# Patient Record
Sex: Female | Born: 1985 | Race: Black or African American | Hispanic: No
Health system: Southern US, Community
[De-identification: ages and names within clinical notes are randomized; demographics above are authoritative.]

## PROBLEM LIST (undated history)

## (undated) ENCOUNTER — Inpatient Hospital Stay (HOSPITAL_COMMUNITY): Payer: Self-pay

## (undated) ENCOUNTER — Emergency Department (HOSPITAL_COMMUNITY): Payer: Medicaid Other

## (undated) DIAGNOSIS — O10919 Unspecified pre-existing hypertension complicating pregnancy, unspecified trimester: Secondary | ICD-10-CM

## (undated) DIAGNOSIS — O903 Peripartum cardiomyopathy: Secondary | ICD-10-CM

## (undated) DIAGNOSIS — B9689 Other specified bacterial agents as the cause of diseases classified elsewhere: Secondary | ICD-10-CM

## (undated) DIAGNOSIS — N760 Acute vaginitis: Secondary | ICD-10-CM

## (undated) DIAGNOSIS — I509 Heart failure, unspecified: Secondary | ICD-10-CM

## (undated) DIAGNOSIS — N159 Renal tubulo-interstitial disease, unspecified: Secondary | ICD-10-CM

## (undated) DIAGNOSIS — R87610 Atypical squamous cells of undetermined significance on cytologic smear of cervix (ASC-US): Secondary | ICD-10-CM

## (undated) DIAGNOSIS — N870 Mild cervical dysplasia: Secondary | ICD-10-CM

## (undated) DIAGNOSIS — O139 Gestational [pregnancy-induced] hypertension without significant proteinuria, unspecified trimester: Secondary | ICD-10-CM

## (undated) DIAGNOSIS — I10 Essential (primary) hypertension: Secondary | ICD-10-CM

## (undated) DIAGNOSIS — N390 Urinary tract infection, site not specified: Secondary | ICD-10-CM

## (undated) DIAGNOSIS — I5041 Acute combined systolic (congestive) and diastolic (congestive) heart failure: Secondary | ICD-10-CM

## (undated) DIAGNOSIS — R87810 Cervical high risk human papillomavirus (HPV) DNA test positive: Secondary | ICD-10-CM

## (undated) DIAGNOSIS — Z87440 Personal history of urinary (tract) infections: Secondary | ICD-10-CM

## (undated) DIAGNOSIS — D582 Other hemoglobinopathies: Secondary | ICD-10-CM

## (undated) DIAGNOSIS — Z98891 History of uterine scar from previous surgery: Secondary | ICD-10-CM

## (undated) DIAGNOSIS — F1290 Cannabis use, unspecified, uncomplicated: Secondary | ICD-10-CM

## (undated) DIAGNOSIS — O24419 Gestational diabetes mellitus in pregnancy, unspecified control: Secondary | ICD-10-CM

## (undated) HISTORY — DX: Cervical high risk human papillomavirus (HPV) DNA test positive: R87.810

## (undated) HISTORY — DX: Other hemoglobinopathies: D58.2

## (undated) HISTORY — DX: Other specified bacterial agents as the cause of diseases classified elsewhere: N76.0

## (undated) HISTORY — DX: Heart failure, unspecified: I50.9

## (undated) HISTORY — DX: Other specified bacterial agents as the cause of diseases classified elsewhere: B96.89

## (undated) HISTORY — DX: Acute combined systolic (congestive) and diastolic (congestive) heart failure: I50.41

## (undated) HISTORY — DX: Mild cervical dysplasia: N87.0

## (undated) HISTORY — DX: Cannabis use, unspecified, uncomplicated: F12.90

## (undated) HISTORY — DX: Renal tubulo-interstitial disease, unspecified: N15.9

## (undated) HISTORY — DX: Essential (primary) hypertension: I10

## (undated) HISTORY — DX: Peripartum cardiomyopathy: O90.3

## (undated) HISTORY — DX: Acute vaginitis: N76.0

## (undated) HISTORY — DX: History of uterine scar from previous surgery: Z98.891

## (undated) HISTORY — DX: Unspecified pre-existing hypertension complicating pregnancy, unspecified trimester: O10.919

## (undated) HISTORY — DX: Personal history of urinary (tract) infections: Z87.440

## (undated) HISTORY — DX: Atypical squamous cells of undetermined significance on cytologic smear of cervix (ASC-US): R87.610

---

## 2003-02-28 ENCOUNTER — Encounter: Payer: Self-pay | Admitting: *Deleted

## 2003-02-28 ENCOUNTER — Inpatient Hospital Stay (HOSPITAL_COMMUNITY): Admission: AD | Admit: 2003-02-28 | Discharge: 2003-02-28 | Payer: Self-pay | Admitting: *Deleted

## 2003-04-20 ENCOUNTER — Encounter: Payer: Self-pay | Admitting: Obstetrics & Gynecology

## 2003-04-20 ENCOUNTER — Ambulatory Visit (HOSPITAL_COMMUNITY): Admission: RE | Admit: 2003-04-20 | Discharge: 2003-04-20 | Payer: Self-pay | Admitting: Obstetrics & Gynecology

## 2003-06-26 ENCOUNTER — Inpatient Hospital Stay (HOSPITAL_COMMUNITY): Admission: AD | Admit: 2003-06-26 | Discharge: 2003-07-08 | Payer: Self-pay | Admitting: Obstetrics & Gynecology

## 2003-06-26 ENCOUNTER — Encounter: Payer: Self-pay | Admitting: Obstetrics & Gynecology

## 2003-06-30 ENCOUNTER — Encounter: Payer: Self-pay | Admitting: Obstetrics & Gynecology

## 2003-06-30 ENCOUNTER — Encounter (INDEPENDENT_AMBULATORY_CARE_PROVIDER_SITE_OTHER): Payer: Self-pay

## 2003-07-30 ENCOUNTER — Inpatient Hospital Stay (HOSPITAL_COMMUNITY): Admission: AD | Admit: 2003-07-30 | Discharge: 2003-07-30 | Payer: Self-pay | Admitting: Obstetrics

## 2003-08-16 ENCOUNTER — Emergency Department (HOSPITAL_COMMUNITY): Admission: AD | Admit: 2003-08-16 | Discharge: 2003-08-16 | Payer: Self-pay | Admitting: Family Medicine

## 2003-10-30 ENCOUNTER — Emergency Department (HOSPITAL_COMMUNITY): Admission: EM | Admit: 2003-10-30 | Discharge: 2003-10-30 | Payer: Self-pay | Admitting: Emergency Medicine

## 2003-11-13 ENCOUNTER — Emergency Department (HOSPITAL_COMMUNITY): Admission: EM | Admit: 2003-11-13 | Discharge: 2003-11-13 | Payer: Self-pay | Admitting: Emergency Medicine

## 2004-03-01 ENCOUNTER — Ambulatory Visit (HOSPITAL_COMMUNITY): Admission: RE | Admit: 2004-03-01 | Discharge: 2004-03-01 | Payer: Self-pay | Admitting: Obstetrics & Gynecology

## 2004-04-25 ENCOUNTER — Inpatient Hospital Stay (HOSPITAL_COMMUNITY): Admission: AD | Admit: 2004-04-25 | Discharge: 2004-04-29 | Payer: Self-pay | Admitting: Obstetrics

## 2004-04-26 ENCOUNTER — Encounter (INDEPENDENT_AMBULATORY_CARE_PROVIDER_SITE_OTHER): Payer: Self-pay | Admitting: *Deleted

## 2005-02-14 ENCOUNTER — Emergency Department (HOSPITAL_COMMUNITY): Admission: EM | Admit: 2005-02-14 | Discharge: 2005-02-14 | Payer: Self-pay | Admitting: Family Medicine

## 2006-03-26 ENCOUNTER — Emergency Department (HOSPITAL_COMMUNITY): Admission: EM | Admit: 2006-03-26 | Discharge: 2006-03-26 | Payer: Self-pay | Admitting: Family Medicine

## 2007-04-09 ENCOUNTER — Emergency Department (HOSPITAL_COMMUNITY): Admission: EM | Admit: 2007-04-09 | Discharge: 2007-04-09 | Payer: Self-pay | Admitting: Family Medicine

## 2007-06-02 ENCOUNTER — Emergency Department (HOSPITAL_COMMUNITY): Admission: EM | Admit: 2007-06-02 | Discharge: 2007-06-02 | Payer: Self-pay | Admitting: Family Medicine

## 2007-11-03 ENCOUNTER — Emergency Department (HOSPITAL_COMMUNITY): Admission: EM | Admit: 2007-11-03 | Discharge: 2007-11-03 | Payer: Self-pay | Admitting: Family Medicine

## 2007-12-28 ENCOUNTER — Emergency Department (HOSPITAL_COMMUNITY): Admission: EM | Admit: 2007-12-28 | Discharge: 2007-12-28 | Payer: Self-pay | Admitting: Emergency Medicine

## 2009-02-24 ENCOUNTER — Inpatient Hospital Stay (HOSPITAL_COMMUNITY): Admission: AD | Admit: 2009-02-24 | Discharge: 2009-02-24 | Payer: Self-pay | Admitting: Obstetrics & Gynecology

## 2009-05-03 ENCOUNTER — Inpatient Hospital Stay (HOSPITAL_COMMUNITY): Admission: AD | Admit: 2009-05-03 | Discharge: 2009-05-03 | Payer: Self-pay | Admitting: Obstetrics & Gynecology

## 2009-05-09 ENCOUNTER — Inpatient Hospital Stay (HOSPITAL_COMMUNITY): Admission: AD | Admit: 2009-05-09 | Discharge: 2009-05-09 | Payer: Self-pay | Admitting: Obstetrics & Gynecology

## 2009-06-12 ENCOUNTER — Inpatient Hospital Stay (HOSPITAL_COMMUNITY): Admission: AD | Admit: 2009-06-12 | Discharge: 2009-06-12 | Payer: Self-pay | Admitting: Obstetrics & Gynecology

## 2009-06-28 ENCOUNTER — Ambulatory Visit: Payer: Self-pay | Admitting: Obstetrics & Gynecology

## 2009-07-12 ENCOUNTER — Ambulatory Visit: Payer: Self-pay | Admitting: Obstetrics & Gynecology

## 2009-07-12 LAB — CONVERTED CEMR LAB
Hgb A: 71.5 % — ABNORMAL LOW (ref 96.8–97.8)
Hgb S Quant: 0 % (ref 0.0–0.0)

## 2009-07-13 ENCOUNTER — Encounter: Payer: Self-pay | Admitting: Obstetrics & Gynecology

## 2009-07-13 LAB — CONVERTED CEMR LAB
Trich, Wet Prep: NONE SEEN
Yeast Wet Prep HPF POC: NONE SEEN

## 2009-07-18 ENCOUNTER — Ambulatory Visit (HOSPITAL_COMMUNITY): Admission: RE | Admit: 2009-07-18 | Discharge: 2009-07-18 | Payer: Self-pay | Admitting: Obstetrics & Gynecology

## 2009-07-26 ENCOUNTER — Ambulatory Visit: Payer: Self-pay | Admitting: Obstetrics & Gynecology

## 2009-08-09 ENCOUNTER — Ambulatory Visit: Payer: Self-pay | Admitting: Obstetrics & Gynecology

## 2009-08-23 ENCOUNTER — Ambulatory Visit: Payer: Self-pay | Admitting: Obstetrics & Gynecology

## 2009-08-30 ENCOUNTER — Encounter: Payer: Self-pay | Admitting: Obstetrics & Gynecology

## 2009-08-30 ENCOUNTER — Ambulatory Visit: Payer: Self-pay | Admitting: Obstetrics & Gynecology

## 2009-08-30 LAB — CONVERTED CEMR LAB: Chlamydia, DNA Probe: NEGATIVE

## 2009-08-31 ENCOUNTER — Encounter: Payer: Self-pay | Admitting: Obstetrics & Gynecology

## 2009-09-13 ENCOUNTER — Ambulatory Visit: Payer: Self-pay | Admitting: Obstetrics & Gynecology

## 2009-09-19 ENCOUNTER — Ambulatory Visit: Payer: Self-pay | Admitting: Family Medicine

## 2009-09-19 ENCOUNTER — Inpatient Hospital Stay (HOSPITAL_COMMUNITY): Admission: RE | Admit: 2009-09-19 | Discharge: 2009-09-22 | Payer: Self-pay | Admitting: Family Medicine

## 2009-09-24 ENCOUNTER — Inpatient Hospital Stay (HOSPITAL_COMMUNITY): Admission: AD | Admit: 2009-09-24 | Discharge: 2009-09-24 | Payer: Self-pay | Admitting: Family Medicine

## 2010-04-19 ENCOUNTER — Emergency Department (HOSPITAL_COMMUNITY): Admission: EM | Admit: 2010-04-19 | Discharge: 2010-04-20 | Payer: Self-pay | Admitting: Emergency Medicine

## 2010-10-13 ENCOUNTER — Encounter: Payer: Self-pay | Admitting: *Deleted

## 2010-12-07 LAB — CBC
HCT: 33.7 % — ABNORMAL LOW (ref 36.0–46.0)
Hemoglobin: 10.8 g/dL — ABNORMAL LOW (ref 12.0–15.0)
Platelets: 257 10*3/uL (ref 150–400)
RBC: 5.14 MIL/uL — ABNORMAL HIGH (ref 3.87–5.11)
RDW: 19.7 % — ABNORMAL HIGH (ref 11.5–15.5)

## 2010-12-07 LAB — URINALYSIS, ROUTINE W REFLEX MICROSCOPIC
Bilirubin Urine: NEGATIVE
Nitrite: POSITIVE — AB
Protein, ur: 30 mg/dL — AB
Urobilinogen, UA: 0.2 mg/dL (ref 0.0–1.0)

## 2010-12-07 LAB — DIFFERENTIAL
Eosinophils Absolute: 0 10*3/uL (ref 0.0–0.7)
Eosinophils Relative: 0 % (ref 0–5)
Lymphocytes Relative: 22 % (ref 12–46)
Metamyelocytes Relative: 0 %
Monocytes Absolute: 0.8 10*3/uL (ref 0.1–1.0)
Monocytes Relative: 7 % (ref 3–12)
Myelocytes: 0 %
Neutro Abs: 7.6 10*3/uL (ref 1.7–7.7)
nRBC: 0 /100 WBC

## 2010-12-07 LAB — URINE MICROSCOPIC-ADD ON

## 2010-12-07 LAB — POCT I-STAT, CHEM 8
Creatinine, Ser: 1.2 mg/dL (ref 0.4–1.2)
Glucose, Bld: 99 mg/dL (ref 70–99)
HCT: 38 % (ref 36.0–46.0)
Potassium: 3.5 mEq/L (ref 3.5–5.1)
TCO2: 25 mmol/L (ref 0–100)

## 2010-12-07 LAB — POCT PREGNANCY, URINE: Preg Test, Ur: NEGATIVE

## 2010-12-08 LAB — URINALYSIS, ROUTINE W REFLEX MICROSCOPIC
Bilirubin Urine: NEGATIVE
Glucose, UA: NEGATIVE mg/dL
Ketones, ur: NEGATIVE mg/dL
Protein, ur: NEGATIVE mg/dL
Specific Gravity, Urine: 1.01 (ref 1.005–1.030)

## 2010-12-08 LAB — CBC: HCT: 22.6 % — ABNORMAL LOW (ref 36.0–46.0)

## 2010-12-08 LAB — COMPREHENSIVE METABOLIC PANEL
AST: 24 U/L (ref 0–37)
BUN: 9 mg/dL (ref 6–23)
CO2: 28 mEq/L (ref 19–32)
Calcium: 8.7 mg/dL (ref 8.4–10.5)
Chloride: 104 mEq/L (ref 96–112)
GFR calc Af Amer: >60 mL/min (ref >60)
GFR calc non Af Amer: >60 mL/min (ref >60)
Glucose, Bld: 94 mg/dL (ref 70–99)
Potassium: 3.6 mEq/L (ref 3.5–5.1)
Sodium: 138 mEq/L (ref 135–145)
Total Protein: 5.5 g/dL — ABNORMAL LOW (ref 6.0–8.3)

## 2010-12-08 LAB — URINE MICROSCOPIC-ADD ON

## 2010-12-23 LAB — POCT URINALYSIS DIP (DEVICE)
Bilirubin Urine: NEGATIVE
Glucose, UA: NEGATIVE mg/dL
Ketones, ur: NEGATIVE mg/dL
Protein, ur: NEGATIVE mg/dL
Specific Gravity, Urine: 1.015 (ref 1.005–1.030)
pH: 7 (ref 5.0–8.0)

## 2010-12-23 LAB — RPR: RPR Ser Ql: NONREACTIVE

## 2010-12-23 LAB — CBC
HCT: 35.7 % — ABNORMAL LOW (ref 36.0–46.0)
MCHC: 33.1 g/dL (ref 30.0–36.0)
MCV: 81.7 fL (ref 78.0–100.0)
Platelets: 195 10*3/uL (ref 150–400)
RBC: 2.64 MIL/uL — ABNORMAL LOW (ref 3.87–5.11)
RDW: 15.7 % — ABNORMAL HIGH (ref 11.5–15.5)
RDW: 15.8 % — ABNORMAL HIGH (ref 11.5–15.5)

## 2010-12-23 LAB — TYPE AND SCREEN: Antibody Screen: NEGATIVE

## 2010-12-24 LAB — POCT URINALYSIS DIP (DEVICE)
Bilirubin Urine: NEGATIVE
Glucose, UA: NEGATIVE mg/dL
Nitrite: NEGATIVE
Specific Gravity, Urine: 1.015 (ref 1.005–1.030)
Specific Gravity, Urine: <=1.005 (ref 1.005–1.030)
Urobilinogen, UA: 0.2 mg/dL (ref 0.0–1.0)
pH: 7 (ref 5.0–8.0)

## 2010-12-25 LAB — POCT URINALYSIS DIP (DEVICE)
Bilirubin Urine: NEGATIVE
Glucose, UA: NEGATIVE mg/dL
Glucose, UA: NEGATIVE mg/dL
Ketones, ur: NEGATIVE mg/dL
Specific Gravity, Urine: 1.025 (ref 1.005–1.030)
pH: 7 (ref 5.0–8.0)

## 2010-12-25 LAB — GLUCOSE, CAPILLARY: Glucose-Capillary: 90 mg/dL (ref 70–99)

## 2010-12-26 LAB — POCT URINALYSIS DIP (DEVICE)
Bilirubin Urine: NEGATIVE
Glucose, UA: NEGATIVE mg/dL
Ketones, ur: NEGATIVE mg/dL
Ketones, ur: NEGATIVE mg/dL
Nitrite: NEGATIVE
Protein, ur: NEGATIVE mg/dL
Protein, ur: NEGATIVE mg/dL
Specific Gravity, Urine: 1.01 (ref 1.005–1.030)
Urobilinogen, UA: 0.2 mg/dL (ref 0.0–1.0)
pH: 6.5 (ref 5.0–8.0)

## 2010-12-27 LAB — WET PREP, GENITAL

## 2010-12-27 LAB — URINALYSIS, ROUTINE W REFLEX MICROSCOPIC
Glucose, UA: NEGATIVE mg/dL
Ketones, ur: NEGATIVE mg/dL
Leukocytes, UA: NEGATIVE
Nitrite: NEGATIVE
Protein, ur: NEGATIVE mg/dL

## 2010-12-27 LAB — URINE MICROSCOPIC-ADD ON

## 2010-12-28 LAB — URINALYSIS, ROUTINE W REFLEX MICROSCOPIC
Glucose, UA: NEGATIVE mg/dL
Leukocytes, UA: NEGATIVE
Nitrite: NEGATIVE
Protein, ur: NEGATIVE mg/dL
Protein, ur: NEGATIVE mg/dL
Specific Gravity, Urine: 1.01 (ref 1.005–1.030)
Specific Gravity, Urine: 1.01 (ref 1.005–1.030)
Urobilinogen, UA: 0.2 mg/dL (ref 0.0–1.0)
Urobilinogen, UA: 0.2 mg/dL (ref 0.0–1.0)

## 2010-12-28 LAB — CBC
HCT: 34.4 % — ABNORMAL LOW (ref 36.0–46.0)
Hemoglobin: 10.8 g/dL — ABNORMAL LOW (ref 12.0–15.0)
MCV: 82.6 fL (ref 78.0–100.0)
Platelets: 214 10*3/uL (ref 150–400)
RBC: 4.16 MIL/uL (ref 3.87–5.11)
RBC: 3.89 MIL/uL (ref 3.87–5.11)
WBC: 9.2 10*3/uL (ref 4.0–10.5)
WBC: 10.1 10*3/uL (ref 4.0–10.5)

## 2010-12-28 LAB — DIFFERENTIAL
Basophils Relative: 2 % — ABNORMAL HIGH (ref 0–1)
Lymphocytes Relative: 19 % (ref 12–46)
Lymphs Abs: 1.9 10*3/uL (ref 0.7–4.0)
Monocytes Absolute: 1 10*3/uL (ref 0.1–1.0)
Monocytes Relative: 10 % (ref 3–12)
Neutro Abs: 6.8 10*3/uL (ref 1.7–7.7)
Neutrophils Relative %: 68 % (ref 43–77)

## 2010-12-28 LAB — URINE MICROSCOPIC-ADD ON

## 2010-12-28 LAB — RAPID URINE DRUG SCREEN, HOSP PERFORMED
Amphetamines: NOT DETECTED
Barbiturates: NOT DETECTED
Benzodiazepines: NOT DETECTED

## 2010-12-28 LAB — RUBELLA SCREEN: Rubella: 36 IU/mL — ABNORMAL HIGH

## 2010-12-28 LAB — RPR: RPR Ser Ql: NONREACTIVE

## 2010-12-28 LAB — ABO/RH: ABO/RH(D): A POS

## 2010-12-28 LAB — GC/CHLAMYDIA PROBE AMP, URINE: GC Probe Amp, Urine: NEGATIVE

## 2010-12-30 LAB — URINALYSIS, ROUTINE W REFLEX MICROSCOPIC
Hgb urine dipstick: NEGATIVE
Nitrite: NEGATIVE
Protein, ur: NEGATIVE mg/dL
Specific Gravity, Urine: 1.025 (ref 1.005–1.030)
Urobilinogen, UA: 0.2 mg/dL (ref 0.0–1.0)

## 2011-02-07 NOTE — Op Note (Signed)
NAME:  Lisa Vazquez, Lisa Vazquez                    ACCOUNT NO.:  [id number]   MEDICAL RECORD NO.:  [id number]                   PATIENT TYPE:  INP   LOCATION:  9372                                 FACILITY:  [facility]   PHYSICIAN:  Roseanna Rainbow, M.D.         DATE OF BIRTH:  10-Jan-1986   DATE OF PROCEDURE:  06/30/2003  DATE OF DISCHARGE:                                 OPERATIVE REPORT   PREOPERATIVE DIAGNOSES:  1. Intrauterine pregnancy at 30 weeks.  2. Severe preeclampsia with pulmonary edema.  3. Breech presentation.   POSTOPERATIVE DIAGNOSES:  1. Intrauterine pregnancy at 30 weeks.  2. Severe preeclampsia with pulmonary edema.  3. Breech presentation.   PROCEDURE:  Primary low transverse cesarean section via Pfannenstiel.   SURGEON:  Roseanna Rainbow, M.D.   ANESTHESIA:  Spinal.   COMPLICATIONS:  None.   ESTIMATED BLOOD LOSS:  900 mL.   FLUIDS AND URINE OUTPUT:  As per anesthesiology.   INDICATIONS FOR PROCEDURE:  The patient is an 25 year old para 0 at 30 weeks  with severe preeclampsia, pulmonary edema.  Ultrasound demonstrated a breech  presentation.   FINDINGS:  Infant breech presentation.  Neonatology present at delivery.  Apgars 3 and 7 at one and five minutes.  Normal uterus, tubes and ovaries.   DESCRIPTION OF PROCEDURE:  The patient was taken to the operating room where  spinal anesthetic was administered and found to be adequate.  She was then  prepped and draped in the normal sterile fashion in the dorsal supine  position with a leftward tilt.  A Pfannenstiel skin incision was then made  with the scalpel and carried through to the underlying layer of fascia with  the Bovie.  The fascia was then incised in the midline and the incision  extended laterally with Mayo scissors.  The superior aspect of the fascial  incision was then grasped with the Kocher clamps, elevated, and the  underlying rectus muscles dissected off.  Attention was then turned to  the  inferior aspect of this incision which was manipulated in a similar fashion.  The rectus muscles were separated in the midline.  The parietal peritoneum  was identified, tented up and entered sharply with Metzenbaum scissors.  The  peritoneal incision was then extended superiorly and inferiorly with good  visualization of the bladder.  The bladder blade was then inserted and the  vesicouterine peritoneum identified, grasped with pickups and entered  sharply with the Metzenbaum scissors.  This incision was then extended  laterally and the bladder flap created digitally.  The bladder blade was  then reinserted in the lower uterine segment, incised in a transverse  fashion with the scalpel.  The uterine incision was then extended laterally  with the bandage scissors.  The bladder blade was removed and a complete  breech extraction was performed.  The cord was clamped and cut.  The infant  was handed off to the awaiting neonatologist.  An umbilical artery  pH was  sent.  The placenta was then removed.  The uterus exteriorized and was  cleared of all clots and debris.  The uterine incision was repaired with 0  Monocryl in a running locked fashion.  A second layer of the same suture was  used to imbricate the middle aspect of the uterine incision.  Excellent  hemostasis was noted.  The uterus was returned to the abdomen.  The gutters  were cleared of all clots.  The peritoneum was closed with 2-0 Vicryl.  The  fascia was reapproximated with 0 Vicryl in running fashion.  The skin was  closed with staples.  The patient tolerated the procedure well.  Sponge, lap  and needle counts correct x 2.  There was 1 g of cefazolin given at cord  clamp.  The patient was taken to the PACU in stable condition.                                               Roseanna Rainbow, M.D.    Judee Clara  D:  07/03/2003  T:  07/03/2003  Job:  [id number]

## 2011-02-07 NOTE — Discharge Summary (Signed)
NAME:  Lisa Vazquez, Lisa Vazquez                    ACCOUNT NO.:  [id number]   MEDICAL RECORD NO.:  [id number]                   PATIENT TYPE:  INP   LOCATION:  9325                                 FACILITY:  [facility]   PHYSICIAN:  Roseanna Rainbow, M.D.         DATE OF BIRTH:  09-Jul-1986   DATE OF ADMISSION:  06/26/2003  DATE OF DISCHARGE:  07/08/2003                                 DISCHARGE SUMMARY   CHIEF COMPLAINT:  The patient is an 25 year old gravida 2, para 0 who  presents at 29-3/7 weeks complaining of a headache.   HISTORY OF PRESENT ILLNESS:  As above.   PRENATAL COURSE:  Source of care [facility] with onset of care at 12 weeks.  No  significant pregnancy complications or risks.   MEDICATIONS:  Prenatal vitamins.   ALLERGIES:  No known drug allergies.   PAST OBSTETRICAL/GYNECOLOGICAL HISTORY:  Voluntary termination of pregnancy  at 8 weeks.  No sexually transmitted diseases.   PAST MEDICAL HISTORY:  She denies.   PAST SURGICAL HISTORY:  She denies.   SOCIAL HISTORY:  No history of substance abuse.   PRENATAL LABORATORY RESULTS:  Blood type and Rh A positive, antibody screen  negative, hemoglobin 13.1, platelets 209,000, rubella nonimmune, hepatitis B  surface antigen negative, syphilis nonreactive, HIV nonreactive, GC  negative, Chlamydia negative.   PHYSICAL EXAMINATION:  VITAL SIGNS:  Pulse is 95, blood pressure 140s-  150s/100-110.  GENERAL:  No apparent distress.  ABDOMEN:  Gravid.  LUNGS:  Clear to auscultation bilaterally.  EXTREMITIES:  Deep tendon reflexes 2+ throughout.  GU/RECTAL:  Digital exam deferred.  Fetal heart rate baseline rate 140s,  average variability, minimally reactive, mild variable decelerations,  contractions none.   LABORATORY WORK:  Urine dip greater than 3+ protein, uric acid 4.9,  creatinine 0.9, hemoglobin 12.9, platelets 186,000, SGOT/PT within normal  limits, LDH less than 250.   ASSESSMENT:  Intrauterine pregnancy at 29-plus  weeks with severe  preeclampsia.   PLAN:  Admission, magnesium sulfate seizure prophylaxis, betamethasone, will  perform a 24-hour urine, and observe closely.   HOSPITAL COURSE:  The patient was admitted.  She received magnesium sulfate  seizure prophylaxis.  She was started on labetalol for labile blood  pressures.  Fetal heart tracings remained reassuring.  Twenty-four-hour  urine remarkable for 3.5 g of protein.  The patient continued to have  increasing water weight gain edema.  On hospital day #3 the fetal heart  tracing was noted to have decreased long-term variability however,  biophysical profile was reassuring.  Later that day the patient became  tachypneic however, her O2 saturations were 98% on room air.  Earlier during  the day the ultrasounds with the biophysical profile demonstrated a breech  presentation.  On exam of her lungs there were rales appreciated at the  right base, at this point the findings were worrisome for early pulmonary  edema, the chest x-ray confirmed this.  The patient was then brought to  the  operating room for a cesarean delivery.  Please see the dictated operative  summary for further details.  Her postoperative course was remarkable for  worsening lab values however, she did not develop HELLP syndrome.  She  required Lasix to stimulate diuresis.  Magnesium sulfate seizure prophylaxis  was continued for 72 hours postoperatively.  On postoperative day #5 her  blood pressures became labile and the magnesium sulfate seizure prophylaxis  was resumed for 24 hours and her p.o. labetalol was titrated.  She was then  discharged to home on postoperative day #8 tolerating a regular diet.   DISCHARGE DIAGNOSES:  1. Intrauterine pregnancy at 29-plus weeks.  2. Severe preeclampsia.  3. Pulmonary edema.   PROCEDURE:  Cesarean delivery.   CONDITION:  Stable.   DIET:  Regular.   ACTIVITY:  No strenuous activity.   MEDICATIONS INCLUDED:  Labetalol, Tylox,  and prenatal vitamins.   DISPOSITION:  The patient was to follow up in the office in several days for  a blood pressure check.                                               Roseanna Rainbow, M.D.    Judee Clara  D:  08/02/2003  T:  08/03/2003  Job:  [id number]

## 2011-02-07 NOTE — Discharge Summary (Signed)
NAME:  Lisa Vazquez, Lisa Vazquez                    ACCOUNT NO.:  [id number]   MEDICAL RECORD NO.:  [id number]                   PATIENT TYPE:  INP   LOCATION:  9107                                 FACILITY:  [facility]   PHYSICIAN:  Charles A. Clearance Coots, M.D.             DATE OF BIRTH:  10/05/1985   DATE OF ADMISSION:  04/25/2004  DATE OF DISCHARGE:  04/29/2004                                 DISCHARGE SUMMARY   ADMITTING DIAGNOSES:  1. Thirty-five weeks gestation.  2. Rule out preeclampsia.  3. Previous cesarean section for severe preeclampsia at [redacted] weeks gestation;     desired repeat cesarean section.   DISCHARGE DIAGNOSES:  1. Thirty-five weeks gestation.  2. Rule out preeclampsia.  3. Previous cesarean section for severe preeclampsia at [redacted] weeks gestation;     desired repeat cesarean section.  4. Preeclampsia.  5. Status post repeat low transverse cesarean section on April 26, 2004 at     1507; Apgars of 7 at one minute, 8 at five minutes; weight of 2625 g,     length of 47 cm.  Discharged home in good condition.   REASON FOR ADMISSION:  A 25 year old black female G3 P0-1-0-1; EDC of  May 28, 2004; presents with a history of headache for several days and  progressively worse blurry vision, swelling of feet and hands on the day of  admission.  History of severe preeclampsia previous pregnancy and delivery  at 30 weeks by cesarean section in October 2004.  The patient had desired a  repeat cesarean section.   PAST MEDICAL HISTORY:  Surgery:  Cesarean section.  Illnesses:  None.   MEDICATIONS:  Prenatal vitamins, Medigesic.   ALLERGIES:  No known drug allergies.   SOCIAL HISTORY:  Single.  Negative tobacco, alcohol, or recreational drug  use.   PHYSICAL EXAMINATION:  VITAL SIGNS:  Temperature 98.6, pulse 105, blood  pressure 136/98, weight approximately 190 pounds.  LUNGS:  Clear to auscultation bilaterally.  HEART:  Regular rate and rhythm.  ABDOMEN:  Gravid, nontender.  PELVIC:  Cervix long and closed.   ADMITTING LABORATORY VALUES:  Hemoglobin 11; hematocrit 35; white blood cell  count 7800; platelets 175,000.  Urine was negative for protein.  BUN was 7,  creatinine was 0.7, uric acid was 4.2, SGOT was 18, SGPT was 11, LDH was  174.   HOSPITAL COURSE:  The patient was admitted and started on magnesium sulfate  for elevated blood pressures.  She responded well to magnesium sulfate and  was taken to the operating room for a repeat cesarean section on April 26, 2004 for preeclampsia.  Primary low transverse cesarean section was  performed without complications.  Postoperative course was uncomplicated.  The patient was on postoperative magnesium sulfate for 24 hours  postoperatively and blood pressures remained very stable.  She was therefore  discharged home on postoperative day #3 in good condition.   DISCHARGE LABORATORY VALUES:  Hemoglobin 9.2; hematocrit  28.4; white blood  cell count 10,000; platelets 193,000.   DISCHARGE DISPOSITION:  1. Medications:  Continue prenatal vitamins.  Tylox and ibuprofen were     prescribed for pain.  2. Routine written instructions per booklet were given for obstetrical     discharge after cesarean section.  3. The patient is to call the office for a follow-up appointment in 2 weeks.                                               Charles A. Clearance Coots, M.D.    CAH/MEDQ  D:  04/29/2004  T:  04/29/2004  Job:  [id number]

## 2011-02-07 NOTE — Op Note (Signed)
NAME:  Lisa Vazquez, Lisa Vazquez                    ACCOUNT NO.:  [id number]   MEDICAL RECORD NO.:  [id number]                   PATIENT TYPE:  INP   LOCATION:  9107                                 [facility]ACILITY:  [facility]   PHYSICIAN:  Roseanna Rainbow, M.D.         DATE OF BIRTH:  Mar 20, 1986   DATE OF PROCEDURE:  04/26/2004  DATE OF DISCHARGE:                                 OPERATIVE REPORT   PREOPERATIVE DIAGNOSES:  1. Intrauterine pregnancy at 35+ weeks.  2. Severe pregnancy-induced hypertension.  3. History of a previous cesarean delivery, declines vaginal birth after     cesarean delivery.   POSTOPERATIVE DIAGNOSES:  1. Intrauterine pregnancy at 35+ weeks.  2. Severe pregnancy-induced hypertension.  3. History of a previous cesarean delivery, declines vaginal birth after     cesarean delivery.   PROCEDURE:  Repeat cesarean delivery via Pfannenstiel skin incision.   SURGEONS:  Roseanna Rainbow, M.D., Bing Neighbors. Clearance Coots, M.D.   ANESTHESIA:  Spinal.   ESTIMATED BLOOD LOSS:  600 mL.   COMPLICATIONS:  None.   PROCEDURE:  The patient is taken to the operating room, a spinal anesthetic  was administered without difficulty.  The patient was then placed in the  dorsal supine position with a leftward tilt and prepped and draped in the  usual sterile fashion.  A Pfannenstiel skin incision was then made with a  scalpel and carried down to the underlying layer of fascia with the Bovie.  The fascia was then incised along its length.  The superior aspect of the  fascial incision was then tented up with Kocher clamps and the underlying  rectus muscles dissected off.  The inferior aspect of the fascial incision  was manipulated in a similar fashion.  The rectus muscles were separated in  the midline.  The parietal peritoneum was entered sharply.  This incision  was then extended superiorly and inferiorly with good visualization of the  bladder.  The bladder blade was then placed.  The  vesicouterine peritoneum  was tented up and entered sharply with Metzenbaum scissors.  This incision  was extended bilaterally and the bladder flap created sharply.  The lower  uterine segment was then incised in a transverse fashion with the scalpel.  This incision was then extended bluntly.  The head was delivered with the  assistance of a vacuum.  The cord was clamped and cut.  The infant was  handed off to the waiting neonatologist.  The placenta was then removed.  The uterus was cleared of any amniotic fluid and clots and debris with a  moistened laparotomy sponge.  The uterine incision was then reapproximated  with 0 Monocryl in a running interlocking fashion.  This incision was  imbricated with 0 Monocryl.  The paracolic gutters were copiously irrigated.  The fascia was  reapproximated with 0 PDS in a running fashion.  The skin was closed in a  subcuticular fashion with 3-0 Monocryl.  At the close of  the procedure the  instrument and pad counts were said to be correct x2.  The patient was taken  to the PACU awake and in stable condition.                                               Roseanna Rainbow, M.D.    Judee Clara  D:  04/26/2004  T:  04/28/2004  Job:  [id number]

## 2011-04-25 ENCOUNTER — Inpatient Hospital Stay (INDEPENDENT_AMBULATORY_CARE_PROVIDER_SITE_OTHER)
Admission: RE | Admit: 2011-04-25 | Discharge: 2011-04-25 | Disposition: A | Discharge status: Home / Self Care | Payer: Medicaid Other | Source: Ambulatory Visit | Attending: Emergency Medicine | Admitting: Emergency Medicine

## 2011-04-25 DIAGNOSIS — N390 Urinary tract infection, site not specified: Secondary | ICD-10-CM

## 2011-04-25 LAB — POCT URINALYSIS DIP (DEVICE)
Glucose, UA: NEGATIVE mg/dL
Nitrite: POSITIVE — AB
Urobilinogen, UA: 0.2 mg/dL (ref 0.0–1.0)

## 2011-06-13 LAB — INFLUENZA A AND B ANTIGEN (CONVERTED LAB)

## 2011-06-17 LAB — URINE CULTURE

## 2011-06-17 LAB — POCT URINALYSIS DIP (DEVICE)
Bilirubin Urine: NEGATIVE
Glucose, UA: NEGATIVE
Specific Gravity, Urine: 1.02
Urobilinogen, UA: 0.2
pH: 5.5

## 2011-06-17 LAB — WET PREP, GENITAL: Yeast Wet Prep HPF POC: NONE SEEN

## 2011-06-17 LAB — POCT PREGNANCY, URINE: Operator id: 126491

## 2011-06-17 LAB — GC/CHLAMYDIA PROBE AMP, GENITAL: GC Probe Amp, Genital: POSITIVE — AB

## 2011-07-07 LAB — POCT URINALYSIS DIP (DEVICE)
Bilirubin Urine: NEGATIVE
Glucose, UA: NEGATIVE
Ketones, ur: NEGATIVE
Nitrite: POSITIVE — AB
Operator id: 116391
Specific Gravity, Urine: 1.025
Urobilinogen, UA: 1
pH: 6

## 2011-07-07 LAB — POCT PREGNANCY, URINE: Operator id: 116391

## 2011-12-09 ENCOUNTER — Other Ambulatory Visit: Payer: Self-pay | Admitting: Obstetrics and Gynecology

## 2012-06-07 ENCOUNTER — Encounter (HOSPITAL_COMMUNITY): Payer: Self-pay

## 2012-06-07 ENCOUNTER — Emergency Department (INDEPENDENT_AMBULATORY_CARE_PROVIDER_SITE_OTHER)
Admission: EM | Admit: 2012-06-07 | Discharge: 2012-06-07 | Disposition: A | Discharge status: Home / Self Care | Payer: Self-pay | Source: Home / Self Care

## 2012-06-07 ENCOUNTER — Emergency Department (HOSPITAL_COMMUNITY)
Admission: EM | Admit: 2012-06-07 | Discharge: 2012-06-07 | Disposition: A | Discharge status: Home / Self Care | Payer: Self-pay | Attending: Emergency Medicine | Admitting: Emergency Medicine

## 2012-06-07 ENCOUNTER — Encounter (HOSPITAL_COMMUNITY): Payer: Self-pay | Admitting: *Deleted

## 2012-06-07 DIAGNOSIS — N10 Acute tubulo-interstitial nephritis: Secondary | ICD-10-CM

## 2012-06-07 DIAGNOSIS — N390 Urinary tract infection, site not specified: Secondary | ICD-10-CM | POA: Insufficient documentation

## 2012-06-07 DIAGNOSIS — R300 Dysuria: Secondary | ICD-10-CM

## 2012-06-07 LAB — COMPREHENSIVE METABOLIC PANEL
AST: 15 U/L (ref 0–37)
Albumin: 4.1 g/dL (ref 3.5–5.2)
Calcium: 9.9 mg/dL (ref 8.4–10.5)
Chloride: 100 mEq/L (ref 96–112)
Creatinine, Ser: 0.91 mg/dL (ref 0.50–1.10)
Total Bilirubin: 0.4 mg/dL (ref 0.3–1.2)

## 2012-06-07 LAB — URINALYSIS, ROUTINE W REFLEX MICROSCOPIC
Nitrite: NEGATIVE
Protein, ur: 30 mg/dL — AB
Urobilinogen, UA: 0.2 mg/dL (ref 0.0–1.0)

## 2012-06-07 LAB — CBC WITH DIFFERENTIAL/PLATELET
Basophils Absolute: 0 10*3/uL (ref 0.0–0.1)
Basophils Relative: 0 % (ref 0–1)
HCT: 38.7 % (ref 36.0–46.0)
MCHC: 34.4 g/dL (ref 30.0–36.0)
Monocytes Absolute: 1.3 10*3/uL — ABNORMAL HIGH (ref 0.1–1.0)
Neutro Abs: 6.5 10*3/uL (ref 1.7–7.7)
RDW: 14.5 % (ref 11.5–15.5)

## 2012-06-07 LAB — POCT URINALYSIS DIP (DEVICE)
Bilirubin Urine: NEGATIVE
Glucose, UA: NEGATIVE mg/dL
Nitrite: POSITIVE — AB

## 2012-06-07 LAB — URINE MICROSCOPIC-ADD ON

## 2012-06-07 LAB — PREGNANCY, URINE: Preg Test, Ur: NEGATIVE

## 2012-06-07 LAB — POCT PREGNANCY, URINE: Preg Test, Ur: NEGATIVE

## 2012-06-07 NOTE — ED Notes (Signed)
States she has been having lower back pain, lower abdominal area pain , chills; tearful

## 2012-06-07 NOTE — ED Notes (Signed)
The pt was diagnosed with a uti at [facility] and sent here for ???.   lmp 4 days ago

## 2012-06-07 NOTE — ED Notes (Signed)
The patient left without being seen and did so without signing herself out.

## 2012-06-07 NOTE — ED Provider Notes (Signed)
History     CSN: [id number]  Arrival date & time 06/07/12  1827   None     Chief Complaint  Patient presents with  . Fever    (Consider location/radiation/quality/duration/timing/severity/associated sxs/prior treatment) The history is provided by the patient.  Lisa Vazquez is a 26 y.o. female who complains of urinary retention and dysuria x one days, with flank pain, fever and chills.  Denies abnormal vaginal discharge or bleeding.   Has not taken medications for symptoms.  Has history of UTI, not frequent, last UTI many years ago.  Denies known kidney disease or structural abnormalities.  Past Medical History  Diagnosis Date  . UTI (lower urinary tract infection)     History reviewed. No pertinent past surgical history.  History reviewed. No pertinent family history.  History  Substance Use Topics  . Smoking status: Current Some Day Smoker  . Smokeless tobacco: Not on file  . Alcohol Use: No    OB History    Grav Para Term Preterm Abortions TAB SAB Ect Mult Living                  Review of Systems  Constitutional: Positive for fever and chills. Negative for fatigue.  Respiratory: Negative.   Cardiovascular: Negative.   Gastrointestinal: Positive for nausea and abdominal pain. Negative for vomiting, diarrhea, constipation, blood in stool and rectal pain.  Genitourinary: Positive for dysuria, flank pain, decreased urine volume, difficulty urinating and pelvic pain. Negative for urgency, frequency, hematuria, vaginal bleeding, vaginal discharge, vaginal pain and menstrual problem.  All other systems reviewed and are negative.    Allergies  Review of patient's allergies indicates no known allergies.  Home Medications   Current Outpatient Rx  Name Route Sig Dispense Refill  . CIPROFLOXACIN HCL 500 MG PO TABS Oral Take 1 tablet (500 mg total) by mouth every 12 (twelve) hours. 10 tablet 0    BP 120/78  Pulse 84  Temp 98.9 F (37.2 C) (Oral)  Resp 16   SpO2 97%  LMP 06/04/2012  Physical Exam  Nursing note and vitals reviewed. Constitutional: She is oriented to person, place, and time. Vital signs are normal. She appears well-developed and well-nourished. She is active and cooperative.  HENT:  Head: Normocephalic.  Eyes: Conjunctivae normal are normal. Pupils are equal, round, and reactive to light. No scleral icterus.  Neck: Trachea normal. Neck supple.  Cardiovascular: Normal rate and regular rhythm.   Pulmonary/Chest: Effort normal and breath sounds normal.  Abdominal: Soft. Normal appearance and bowel sounds are normal. There is tenderness in the suprapubic area. There is guarding and CVA tenderness.  Neurological: She is alert and oriented to person, place, and time. No cranial nerve deficit or sensory deficit.  Skin: Skin is warm and dry. No rash noted. She is not diaphoretic.  Psychiatric: She has a normal mood and affect. Her speech is normal and behavior is normal. Judgment and thought content normal. Cognition and memory are normal.    ED Course  Procedures (including critical care time)  Labs Reviewed  POCT URINALYSIS DIP (DEVICE) - Abnormal; Notable for the following:    Hgb urine dipstick MODERATE (*)     Protein, ur 30 (*)     Nitrite POSITIVE (*)     Leukocytes, UA MODERATE (*)  Biochemical Testing Only. Please order routine urinalysis from main lab if confirmatory testing is needed.   All other components within normal limits  POCT PREGNANCY, URINE  LAB REPORT - SCANNED  No results found.   1. Acute pyelonephritis   2. Dysuria       MDM  Transfer to [facility] for further evaluation and treatment.  Suspect need for IV antibiotics and fluids.         [name], NP 06/10/12 1057

## 2012-06-08 ENCOUNTER — Emergency Department (HOSPITAL_COMMUNITY)
Admission: EM | Admit: 2012-06-08 | Discharge: 2012-06-08 | Disposition: A | Discharge status: Home / Self Care | Payer: Self-pay | Attending: Emergency Medicine | Admitting: Emergency Medicine

## 2012-06-08 ENCOUNTER — Encounter (HOSPITAL_COMMUNITY): Payer: Self-pay

## 2012-06-08 DIAGNOSIS — N12 Tubulo-interstitial nephritis, not specified as acute or chronic: Secondary | ICD-10-CM | POA: Insufficient documentation

## 2012-06-08 HISTORY — DX: Urinary tract infection, site not specified: N39.0

## 2012-06-08 LAB — CBC WITH DIFFERENTIAL/PLATELET
Basophils Absolute: 0 10*3/uL (ref 0.0–0.1)
Basophils Relative: 0 % (ref 0–1)
Eosinophils Absolute: 0.1 10*3/uL (ref 0.0–0.7)
Eosinophils Relative: 1 % (ref 0–5)
HCT: 39 % (ref 36.0–46.0)
Hemoglobin: 13 g/dL (ref 12.0–15.0)
Lymphocytes Relative: 31 % (ref 12–46)
Lymphs Abs: 2.5 10*3/uL (ref 0.7–4.0)
MCH: 26.1 pg (ref 26.0–34.0)
MCHC: 33.3 g/dL (ref 30.0–36.0)
MCV: 78.3 fL (ref 78.0–100.0)
Monocytes Absolute: 1.1 10*3/uL — ABNORMAL HIGH (ref 0.1–1.0)
Monocytes Relative: 13 % — ABNORMAL HIGH (ref 3–12)
Neutro Abs: 4.5 10*3/uL (ref 1.7–7.7)
Neutrophils Relative %: 55 % (ref 43–77)
Platelets: 277 10*3/uL (ref 150–400)
RBC: 4.98 MIL/uL (ref 3.87–5.11)
RDW: 14.6 % (ref 11.5–15.5)
WBC: 8.1 10*3/uL (ref 4.0–10.5)

## 2012-06-08 LAB — COMPREHENSIVE METABOLIC PANEL
ALT: 11 U/L (ref 0–35)
AST: 12 U/L (ref 0–37)
Albumin: 3.8 g/dL (ref 3.5–5.2)
Alkaline Phosphatase: 52 U/L (ref 39–117)
BUN: 11 mg/dL (ref 6–23)
CO2: 28 mEq/L (ref 19–32)
Calcium: 9.6 mg/dL (ref 8.4–10.5)
Chloride: 100 mEq/L (ref 96–112)
Creatinine, Ser: 0.94 mg/dL (ref 0.50–1.10)
GFR calc Af Amer: >90 mL/min (ref >90)
GFR calc non Af Amer: 82 mL/min — ABNORMAL LOW (ref >90)
Glucose, Bld: 81 mg/dL (ref 70–99)
Potassium: 3.8 mEq/L (ref 3.5–5.1)
Sodium: 137 mEq/L (ref 135–145)
Total Bilirubin: 0.6 mg/dL (ref 0.3–1.2)
Total Protein: 7.4 g/dL (ref 6.0–8.3)

## 2012-06-08 LAB — LIPASE, BLOOD: Lipase: 12 U/L (ref 11–59)

## 2012-06-08 MED ORDER — MORPHINE SULFATE 4 MG/ML IJ SOLN
4.0000 mg | Freq: Once | INTRAMUSCULAR | Status: AC
  Administered 2012-06-08: 4 mg via INTRAVENOUS
  Filled 2012-06-08: qty 1

## 2012-06-08 MED ORDER — ONDANSETRON HCL 4 MG/2ML IJ SOLN
4.0000 mg | Freq: Once | INTRAMUSCULAR | Status: AC
  Administered 2012-06-08: 4 mg via INTRAVENOUS
  Filled 2012-06-08: qty 2

## 2012-06-08 MED ORDER — CIPROFLOXACIN HCL 500 MG PO TABS: 500.0000 mg | ORAL_TABLET | Freq: Two times a day (BID) | ORAL | Status: DC

## 2012-06-08 MED ORDER — SODIUM CHLORIDE 0.9 % IV BOLUS (SEPSIS)
1000.0000 mL | Freq: Once | INTRAVENOUS | Status: AC
  Administered 2012-06-08: 1000 mL via INTRAVENOUS

## 2012-06-08 MED ORDER — DEXTROSE 5 % IV SOLN
1.0000 g | Freq: Once | INTRAVENOUS | Status: AC
  Administered 2012-06-08: 1 g via INTRAVENOUS
  Filled 2012-06-08: qty 10

## 2012-06-08 MED ORDER — OXYCODONE-ACETAMINOPHEN 5-325 MG PO TABS
1.0000 | ORAL_TABLET | Freq: Once | ORAL | Status: AC
  Administered 2012-06-08: 1 via ORAL

## 2012-06-08 MED ORDER — OXYCODONE-ACETAMINOPHEN 5-325 MG PO TABS
ORAL_TABLET | ORAL | Status: AC
  Filled 2012-06-08: qty 1

## 2012-06-08 NOTE — ED Notes (Signed)
Pt presents with no acute distress- c/o of lower abdominal pain and bil flank pain greater on the right.  Denies N/V/D and fever

## 2012-06-10 LAB — URINE CULTURE: Colony Count: 50000

## 2012-06-11 NOTE — ED Notes (Signed)
+   Urine Patient treated with Cipro-sensitive to same-chart appended per protocol MD. 

## 2012-06-18 NOTE — ED Provider Notes (Signed)
Medical screening examination/treatment/procedure(s) were performed by resident physician or non-physician practitioner and as supervising physician I was immediately available for consultation/collaboration.   KINDL,JAMES DOUGLAS MD.    James D Kindl, MD 06/18/12 0822 

## 2012-06-18 NOTE — ED Provider Notes (Signed)
History    26 year old female with abdominal pain and dysuria. Onset about 3 days ago. Patient was evaluated in urgent care yesterday and referred to the ED. She did not want to wait so she went home and she came back today. Symptoms have persisted without significant change since then. Subjective fever. Nausea but no vomiting. No unusual vaginal bleeding or discharge. No diarrhea. No sick contacts. CSN: [id number]  Arrival date & time 06/08/12  4098   First MD Initiated Contact with Patient 06/08/12 1037      Chief Complaint  Patient presents with  . Abdominal Pain  . Urinary Retention  . Flank Pain    (Consider location/radiation/quality/duration/timing/severity/associated sxs/prior treatment) HPI  Past Medical History  Diagnosis Date  . UTI (lower urinary tract infection)     History reviewed. No pertinent past surgical history.  No family history on file.  History  Substance Use Topics  . Smoking status: Current Some Day Smoker  . Smokeless tobacco: Not on file  . Alcohol Use: No    OB History    Grav Para Term Preterm Abortions TAB SAB Ect Mult Living                  Review of Systems   Review of symptoms negative unless otherwise noted in HPI.   Allergies  Review of patient's allergies indicates no known allergies.  Home Medications   Current Outpatient Rx  Name Route Sig Dispense Refill  . CIPROFLOXACIN HCL 500 MG PO TABS Oral Take 1 tablet (500 mg total) by mouth every 12 (twelve) hours. 10 tablet 0    BP 108/68  Pulse 73  Temp 97.9 F (36.6 C) (Oral)  Resp 18  Ht 5\' 4"  (1.626 m)  Wt 145 lb (65.772 kg)  BMI 24.89 kg/m2  SpO2 97%  LMP 06/04/2012  Physical Exam  Nursing note and vitals reviewed. Constitutional: She appears well-developed and well-nourished. No distress.       In bed. Uncomfortable appearing but not toxic.  HENT:  Head: Normocephalic and atraumatic.  Eyes: Conjunctivae normal are normal. Right eye exhibits no discharge.  Left eye exhibits no discharge.  Neck: Neck supple.  Cardiovascular: Normal rate, regular rhythm and normal heart sounds.  Exam reveals no gallop and no friction rub.   No murmur heard. Pulmonary/Chest: Effort normal and breath sounds normal. No respiratory distress.  Abdominal: Soft. She exhibits no distension. There is tenderness.       Suprapubic tenderness with some voluntary guarding. No rebound. No mass palpated. No distention. No concerning lesions noted. Right CVA tenderness.  Musculoskeletal: She exhibits no edema and no tenderness.  Neurological: She is alert.  Skin: Skin is warm and dry.  Psychiatric: She has a normal mood and affect. Her behavior is normal. Thought content normal.    ED Course  Procedures (including critical care time)  Labs Reviewed  CBC WITH DIFFERENTIAL - Abnormal; Notable for the following:    Monocytes Relative 13 (*)     Monocytes Absolute 1.1 (*)     All other components within normal limits  COMPREHENSIVE METABOLIC PANEL - Abnormal; Notable for the following:    GFR calc non Af Amer 82 (*)     All other components within normal limits  LIPASE, BLOOD  URINE CULTURE  LAB REPORT - SCANNED   No results found.   1. Pyelonephritis       MDM  26 year old female with likely pyelonephritis. Urinalysis from yesterday was reviewed. Her urine is  consistent with infection. She received a dose of IV antibiotics in the ER. Symptoms are currently controlled. She's not having ongoing vomiting. She is afebrile and hemodynamically stable. Feel she is appropriate for outpatient treatment at this time. Strict return precautions were discussed. Outpatient followup otherwise.        Raeford Razor, MD 06/18/12 [phone]

## 2012-12-17 ENCOUNTER — Inpatient Hospital Stay (HOSPITAL_COMMUNITY)
Admission: AD | Admit: 2012-12-17 | Discharge: 2012-12-17 | Disposition: A | Discharge status: Home / Self Care | Payer: Self-pay | Source: Ambulatory Visit | Attending: Obstetrics & Gynecology | Admitting: Obstetrics & Gynecology

## 2012-12-17 ENCOUNTER — Encounter (HOSPITAL_COMMUNITY): Payer: Self-pay | Admitting: *Deleted

## 2012-12-17 DIAGNOSIS — N912 Amenorrhea, unspecified: Secondary | ICD-10-CM | POA: Insufficient documentation

## 2012-12-17 DIAGNOSIS — N760 Acute vaginitis: Secondary | ICD-10-CM | POA: Insufficient documentation

## 2012-12-17 DIAGNOSIS — N930 Postcoital and contact bleeding: Secondary | ICD-10-CM | POA: Insufficient documentation

## 2012-12-17 DIAGNOSIS — A499 Bacterial infection, unspecified: Secondary | ICD-10-CM | POA: Insufficient documentation

## 2012-12-17 DIAGNOSIS — B9689 Other specified bacterial agents as the cause of diseases classified elsewhere: Secondary | ICD-10-CM | POA: Insufficient documentation

## 2012-12-17 LAB — URINALYSIS, ROUTINE W REFLEX MICROSCOPIC
Hgb urine dipstick: NEGATIVE
Ketones, ur: NEGATIVE mg/dL
Protein, ur: NEGATIVE mg/dL
Urobilinogen, UA: 0.2 mg/dL (ref 0.0–1.0)

## 2012-12-17 LAB — POCT PREGNANCY, URINE: Preg Test, Ur: NEGATIVE

## 2012-12-17 MED ORDER — METRONIDAZOLE 500 MG PO TABS: 500.0000 mg | ORAL_TABLET | Freq: Two times a day (BID) | ORAL | Status: DC

## 2012-12-17 NOTE — MAU Note (Signed)
No cycle this month, one invalid test, one neg test.  Has been having bleeding with intercourse for a month.

## 2012-12-17 NOTE — MAU Provider Note (Signed)
History     CSN: [id number]  Arrival date and time: 12/17/12 1124   First Provider Initiated Contact with Patient 12/17/12 1217      Chief Complaint  Patient presents with  . Possible Pregnancy  . post coital bleeding    HPI Ms. Lisa Vazquez is a 27 y.o. [phone] who presents to MAU today with complaint of amenorrhea and post-coital bleeding. The patient states LMP was 11/08/12. She has had bleeding with intercourse for months. She is having mild lower abdominal cramping how. She denies fever, N/V today. She has been with the same partner x 2 years. She is sexually active without birth control or condoms. Last pap smear was done 12/09/11 and was normal although no transformation zone present on sample. The patient states that she has had a slight increase in discharge over the last week or so. There is no odor or change in color of the discharge.   OB History   Grav Para Term Preterm Abortions TAB SAB Ect Mult Living   4 3 1 2 1 1   0 0 3      Past Medical History  Diagnosis Date  . UTI (lower urinary tract infection)     No past surgical history on file.  No family history on file.  History  Substance Use Topics  . Smoking status: Current Some Day Smoker  . Smokeless tobacco: Not on file  . Alcohol Use: No    Allergies: No Known Allergies  Prescriptions prior to admission  Medication Sig Dispense Refill  . ibuprofen (ADVIL,MOTRIN) 800 MG tablet Take 800 mg by mouth every 8 (eight) hours as needed for pain.        Review of Systems  Constitutional: Negative for fever.  Gastrointestinal: Negative for nausea, vomiting and abdominal pain.  Genitourinary: Negative for dysuria, urgency and frequency.       + vaginal bleeding with intercourse + vaginal discharge   Physical Exam   Blood pressure 132/83, pulse 77, temperature 98.3 F (36.8 C), temperature source Oral, resp. rate 18, height 5\' 4"  (1.626 m), weight 146 lb (66.225 kg), last menstrual period  11/08/2012.  Physical Exam  Constitutional: She is oriented to person, place, and time. She appears well-developed and well-nourished. No distress.  HENT:  Head: Normocephalic and atraumatic.  Cardiovascular: Normal rate, regular rhythm and normal heart sounds.   Respiratory: Effort normal and breath sounds normal. No respiratory distress.  GI: Soft. Bowel sounds are normal. She exhibits no distension and no mass. There is tenderness (mild suprapubic tenderness to palpation). There is no rebound and no guarding.  Genitourinary: Vagina normal. Uterus is tender (mild tenderness to palpation on bimanual exam). Uterus is not enlarged. Cervix exhibits discharge (moderate amount of thin white discharge noted at the cervical os and in the vagina). Cervix exhibits no motion tenderness and no friability. Right adnexum displays no mass and no tenderness. Left adnexum displays no tenderness.  Neurological: She is alert and oriented to person, place, and time.  Skin: Skin is warm and dry. No erythema.  Psychiatric: She has a normal mood and affect.   Results for orders placed during the hospital encounter of 12/17/12 (from the past 24 hour(s))  URINALYSIS, ROUTINE W REFLEX MICROSCOPIC     Status: None   Collection Time    12/17/12 11:40 AM      Result Value Range   Color, Urine YELLOW  YELLOW   APPearance CLEAR  CLEAR   Specific Gravity, Urine 1.020  1.005 - 1.030   pH 5.5  5.0 - 8.0   Glucose, UA NEGATIVE  NEGATIVE mg/dL   Hgb urine dipstick NEGATIVE  NEGATIVE   Bilirubin Urine NEGATIVE  NEGATIVE   Ketones, ur NEGATIVE  NEGATIVE mg/dL   Protein, ur NEGATIVE  NEGATIVE mg/dL   Urobilinogen, UA 0.2  0.0 - 1.0 mg/dL   Nitrite NEGATIVE  NEGATIVE   Leukocytes, UA NEGATIVE  NEGATIVE  POCT PREGNANCY, URINE     Status: None   Collection Time    12/17/12 11:49 AM      Result Value Range   Preg Test, Ur NEGATIVE  NEGATIVE  WET PREP, GENITAL     Status: Abnormal   Collection Time    12/17/12 12:35 PM       Result Value Range   Yeast Wet Prep HPF POC NONE SEEN  NONE SEEN   Trich, Wet Prep NONE SEEN  NONE SEEN   Clue Cells Wet Prep HPF POC MODERATE (*) NONE SEEN   WBC, Wet Prep HPF POC FEW (*) NONE SEEN  HCG, SERUM, QUALITATIVE     Status: None   Collection Time    12/17/12 12:45 PM      Result Value Range   Preg, Serum NEGATIVE  NEGATIVE    MAU Course  Procedures None  MDM UPT, Serum pregnancy test, wet prep and GC/Chlamydia today  Assessment and Plan  A: Amenorrhea Bacterial vaginosis Post-coital bleeding  P: Discharge home Rx for Flagyl sent to patient's pharmacy Patient encouraged to take HPT in 1 week if still no period. Will refer to clinic for further work-up for amenorrhea Patient may return to MAU if her condition were to change or worsen  Freddi Starr, PA-C  12/17/2012, 1:26 PM

## 2012-12-17 NOTE — MAU Note (Signed)
Name and DOB verified, pt confirmed spelling is correct on arm band. 

## 2012-12-18 LAB — GC/CHLAMYDIA PROBE AMP
CT Probe RNA: NEGATIVE
GC Probe RNA: NEGATIVE

## 2013-01-13 ENCOUNTER — Encounter: Payer: Self-pay | Admitting: Medical

## 2013-01-13 ENCOUNTER — Ambulatory Visit (INDEPENDENT_AMBULATORY_CARE_PROVIDER_SITE_OTHER): Payer: Self-pay | Admitting: Medical

## 2013-01-13 VITALS — BP 132/92 | HR 73 | Temp 97.6°F | Ht 64.0 in | Wt 144.0 lb

## 2013-01-13 DIAGNOSIS — Z01419 Encounter for gynecological examination (general) (routine) without abnormal findings: Secondary | ICD-10-CM

## 2013-01-13 DIAGNOSIS — N949 Unspecified condition associated with female genital organs and menstrual cycle: Secondary | ICD-10-CM

## 2013-01-13 DIAGNOSIS — R102 Pelvic and perineal pain: Secondary | ICD-10-CM

## 2013-01-13 LAB — POCT URINALYSIS DIP (DEVICE)
Bilirubin Urine: NEGATIVE
Glucose, UA: NEGATIVE mg/dL
Nitrite: NEGATIVE
Urobilinogen, UA: 1 mg/dL (ref 0.0–1.0)

## 2013-01-13 NOTE — Progress Notes (Signed)
Patient ID: Lisa Vazquez, female   DOB: [birth year], 27 y.o.   MRN: Lisa Vazquez Subjective:    Lisa Vazquez is a 27 y.o. female who presents for an annual exam. The patient is sexually active. GYN screening history: last pap: approximate date 2013 and was normal. The patient wears seatbelts: yes. The patient participates in regular exercise: yes. Has the patient ever been transfused or tattooed?: yes. The patient reports that there is not domestic violence in her life. The patient states that she has continued to have post-coital spotting even after recent treatment for BV. GC/Chlamdyia was negative on 12/17/12. Patient has been having irregular bleeding since the removal of her Mirena in January. She is also having some pelvic cramping occasionally recently.   Menstrual History: OB History   Grav Para Term Preterm Abortions TAB SAB Ect Mult Living   4 3 1 2 1 1   0 0 3       Patient's last menstrual period was 12/14/2012.    The following portions of the patient's history were reviewed and updated as appropriate: allergies, current medications, past family history, past medical history, past social history, past surgical history and problem list.  Review of Systems Pertinent items are noted in HPI.    Objective:     BP 132/92  Pulse 73  Temp(Src) 97.6 F (36.4 C)  Ht 5\' 4"  (1.626 m)  Wt 144 lb (65.318 kg)  BMI 24.71 kg/m2  LMP 12/14/2012 GENERAL: Well-developed, well-nourished female in no acute distress.  HEENT: Normocephalic, atraumatic. Sclerae anicteric.  NECK: Supple. Normal thyroid.  LUNGS: Clear to auscultation bilaterally.  HEART: Regular rate and rhythm. BREASTS: Symmetric in size. No masses, skin changes, nipple drainage, or lymphadenopathy. ABDOMEN: Soft, nontender, nondistended. No organomegaly. PELVIC: Normal external female genitalia. Vagina is pink and rugated.  Normal discharge. Normal cervix contour. Pap smear obtained. Uterus is normal in size. No  adnexal mass or tenderness.  EXTREMITIES: No cyanosis, clubbing, or edema.  .    Assessment:    Healthy female exam.     Plan:     1. UPT negative today. UA shows moderate leukocytes, trace protein and trace blood. Will send for culture today.  2. Patient will return to clinic in 2 weeks for UPT and if negative can have Depo Provera at that time. Patient instructed to use condoms and/or abstain from intercourse until next UPT and depo provera given 3. Patient will receive a letter for normal pap smear results or we will call with any abnormal results 4. Patient can return for next annual exam in one year, next pap smear will be due in 2017 if normal today

## 2013-01-13 NOTE — Patient Instructions (Addendum)
Pap Test A Pap test is a procedure done in a clinic office to evaluate cells that are on the surface of the cervix. The cervix is the lower portion of the uterus and upper portion of the vagina. For some women, the cervical region has the potential to form cancer. With consistent evaluations by your caregiver, this type of cancer can be prevented.  If a Pap test is abnormal, it is most often a result of a previous exposure to human papillomavirus (HPV). HPV is a virus that can infect the cells of the cervix and cause dysplasia. Dysplasia is where the cells no longer look normal. If a woman has been diagnosed with high-grade or severe dysplasia, they are at higher risk of developing cervical cancer. People diagnosed with low-grade dysplasia should still be seen by their caregiver because there is a small chance that low-grade dysplasia could develop into cancer.  LET YOUR CAREGIVER KNOW ABOUT:  Recent sexually transmitted infection (STI) you have had.  Any new sex partners you have had.  History of previous abnormal Pap tests results.  History of previous cervical procedures you have had (colposcopy, biopsy, loop electrosurgical excision procedure [LEEP]).  Concerns you have had regarding unusual vaginal discharge.  History of pelvic pain.  Your use of birth control. BEFORE THE PROCEDURE  Ask your caregiver when to schedule your Pap test. It is best not to be on your period if your caregiver uses a wooden spatula to collect cells or applies cells to a glass slide. Newer techniques are not so sensitive to the timing of a menstrual cycle.  Do not douche or have sexual intercourse for 24 hours before the test.   Do not use vaginal creams or tampons for 24 hours before the test.   Empty your bladder just before the test to lessen any discomfort.  PROCEDURE You will lie on an exam table with your feet in stirrups. A warm metal or plastic instrument (speculum) is placed in your vagina. This  instrument allows your caregiver to see the inside of your vagina and look at your cervix. A small, plastic brush or wooden spatula is then used to collect cervical cells. These cells are placed in a lab specimen container. The cells are looked at under a microscope. A specialist will determine if the cells are normal.  AFTER THE PROCEDURE Make sure to get your test results.If your results come back abnormal, you may need further testing.  Document Released: 11/29/2002 Document Revised: 12/01/2011 Document Reviewed: 09/04/2011 [facility] Patient Information 2013 [place], [place].  Medroxyprogesterone injection [Contraceptive] What is this medicine? MEDROXYPROGESTERONE (me DROX ee proe JES te rone) contraceptive injections prevent pregnancy. They provide effective birth control for 3 months. Depo-subQ Provera 104 is also used for treating pain related to endometriosis. This medicine may be used for other purposes; ask your health care provider or pharmacist if you have questions. What should I tell my health care provider before I take this medicine? They need to know if you have any of these conditions: -frequently drink alcohol -asthma -blood vessel disease or a history of a blood clot in the lungs or legs -bone disease such as osteoporosis -breast cancer -diabetes -eating disorder (anorexia nervosa or bulimia) -high blood pressure -HIV infection or AIDS -kidney disease -liver disease -mental depression -migraine -seizures (convulsions) -stroke -tobacco smoker -vaginal bleeding -an unusual or allergic reaction to medroxyprogesterone, other hormones, medicines, foods, dyes, or preservatives -pregnant or trying to get pregnant -breast-feeding How should I use this medicine?  Depo-Provera Contraceptive injection is given into a muscle. Depo-subQ Provera 104 injection is given under the skin. These injections are given by a health care professional. You must not be pregnant before getting  an injection. The injection is usually given during the first 5 days after the start of a menstrual period or 6 weeks after delivery of a baby. Talk to your pediatrician regarding the use of this medicine in children. Special care may be needed. These injections have been used in female children who have started having menstrual periods. Overdosage: If you think you have taken too much of this medicine contact a poison control center or emergency room at once. NOTE: This medicine is only for you. Do not share this medicine with others. What if I miss a dose? Try not to miss a dose. You must get an injection once every 3 months to maintain birth control. If you cannot keep an appointment, call and reschedule it. If you wait longer than 13 weeks between Depo-Provera contraceptive injections or longer than 14 weeks between Depo-subQ Provera 104 injections, you could get pregnant. Use another method for birth control if you miss your appointment. You may also need a pregnancy test before receiving another injection. What may interact with this medicine? Do not take this medicine with any of the following medications: -bosentan This medicine may also interact with the following medications: -aminoglutethimide -antibiotics or medicines for infections, especially rifampin, rifabutin, rifapentine, and griseofulvin -aprepitant -barbiturate medicines such as phenobarbital or primidone -bexarotene -carbamazepine -medicines for seizures like ethotoin, felbamate, oxcarbazepine, phenytoin, topiramate -modafinil -St. John's wort This list may not describe all possible interactions. Give your health care provider a list of all the medicines, herbs, non-prescription drugs, or dietary supplements you use. Also tell them if you smoke, drink alcohol, or use illegal drugs. Some items may interact with your medicine. What should I watch for while using this medicine? This drug does not protect you against HIV  infection (AIDS) or other sexually transmitted diseases. Use of this product may cause you to lose calcium from your bones. Loss of calcium may cause weak bones (osteoporosis). Only use this product for more than 2 years if other forms of birth control are not right for you. The longer you use this product for birth control the more likely you will be at risk for weak bones. Ask your health care professional how you can keep strong bones. You may have a change in bleeding pattern or irregular periods. Many females stop having periods while taking this drug. If you have received your injections on time, your chance of being pregnant is very low. If you think you may be pregnant, see your health care professional as soon as possible. Tell your health care professional if you want to get pregnant within the next year. The effect of this medicine may last a long time after you get your last injection. What side effects may I notice from receiving this medicine? Side effects that you should report to your doctor or health care professional as soon as possible: -allergic reactions like skin rash, itching or hives, swelling of the face, lips, or tongue -breast tenderness or discharge -breathing problems -changes in vision -depression -feeling faint or lightheaded, falls -fever -pain in the abdomen, chest, groin, or leg -problems with balance, talking, walking -unusually weak or tired -yellowing of the eyes or skin Side effects that usually do not require medical attention (report to your doctor or health care professional if they continue or  are bothersome): -acne -fluid retention and swelling -headache -irregular periods, spotting, or absent periods -temporary pain, itching, or skin reaction at site where injected -weight gain This list may not describe all possible side effects. Call your doctor for medical advice about side effects. You may report side effects to FDA at 1-800-FDA-1088. Where  should I keep my medicine? This does not apply. The injection will be given to you by a health care professional. NOTE: This sheet is a summary. It may not cover all possible information. If you have questions about this medicine, talk to your doctor, pharmacist, or health care provider.  2013, [employer]. (09/29/2008 6:37:56 PM)

## 2013-01-13 NOTE — Progress Notes (Signed)
Here for annual exam. Also reports didn't have a period for about 2 months, then came on but only for 2 days since February. Had Mirena removed in January.  Also c/o bleeding after intercourse everytime for about 6 months.

## 2013-01-13 NOTE — Addendum Note (Signed)
Addended by: Faythe Casa on: 01/13/2013 03:06 PM   Modules accepted: Orders

## 2013-01-16 ENCOUNTER — Other Ambulatory Visit: Payer: Self-pay | Admitting: Medical

## 2013-01-16 DIAGNOSIS — N390 Urinary tract infection, site not specified: Secondary | ICD-10-CM

## 2013-01-16 LAB — URINE CULTURE: Colony Count: >=100000

## 2013-01-16 MED ORDER — CIPROFLOXACIN HCL 500 MG PO TABS: 500.0000 mg | ORAL_TABLET | Freq: Two times a day (BID) | ORAL | Status: DC

## 2013-01-16 NOTE — Progress Notes (Signed)
Rx for Cipro sent to pharmacy. Note sent to clinical pool to inform patient of diagnosis and Rx. Patient also needs to be informed not to take goody powders while on antibiotic.

## 2013-01-17 ENCOUNTER — Encounter (HOSPITAL_COMMUNITY): Payer: Self-pay | Admitting: *Deleted

## 2013-01-17 ENCOUNTER — Emergency Department (HOSPITAL_COMMUNITY)
Admission: EM | Admit: 2013-01-17 | Discharge: 2013-01-17 | Discharge status: Against Medical Advice | Payer: Self-pay | Attending: Emergency Medicine | Admitting: Emergency Medicine

## 2013-01-17 DIAGNOSIS — R110 Nausea: Secondary | ICD-10-CM | POA: Insufficient documentation

## 2013-01-17 DIAGNOSIS — Z3202 Encounter for pregnancy test, result negative: Secondary | ICD-10-CM | POA: Insufficient documentation

## 2013-01-17 DIAGNOSIS — R300 Dysuria: Secondary | ICD-10-CM | POA: Insufficient documentation

## 2013-01-17 DIAGNOSIS — R1032 Left lower quadrant pain: Secondary | ICD-10-CM | POA: Insufficient documentation

## 2013-01-17 DIAGNOSIS — M549 Dorsalgia, unspecified: Secondary | ICD-10-CM | POA: Insufficient documentation

## 2013-01-17 DIAGNOSIS — R51 Headache: Secondary | ICD-10-CM | POA: Insufficient documentation

## 2013-01-17 DIAGNOSIS — R42 Dizziness and giddiness: Secondary | ICD-10-CM | POA: Insufficient documentation

## 2013-01-17 LAB — URINE MICROSCOPIC-ADD ON

## 2013-01-17 LAB — URINALYSIS, ROUTINE W REFLEX MICROSCOPIC
Bilirubin Urine: NEGATIVE
Glucose, UA: NEGATIVE mg/dL
Protein, ur: 100 mg/dL — AB
Specific Gravity, Urine: 1.02 (ref 1.005–1.030)
Urobilinogen, UA: 1 mg/dL (ref 0.0–1.0)

## 2013-01-17 MED ORDER — IBUPROFEN 200 MG PO TABS
400.0000 mg | ORAL_TABLET | Freq: Once | ORAL | Status: AC
  Administered 2013-01-17: 400 mg via ORAL
  Filled 2013-01-17: qty 2

## 2013-01-17 MED ORDER — OXYCODONE-ACETAMINOPHEN 5-325 MG PO TABS
1.0000 | ORAL_TABLET | Freq: Once | ORAL | Status: DC
  Filled 2013-01-17: qty 1

## 2013-01-17 NOTE — ED Notes (Addendum)
C/o sharp LLQ abd pain, radiates thru to back, also reports nausea, HA, dizziness, dysuria and decreased urinary output. Onset 1600. (Denies: vd, fever or bleeding), No meds PTA. Last ate 1300. Last BM this morning (normal). "last UTI 2 months ago". "does not feel like a UTI". H/o chronic UTI and BV. Minor child/ toddler here with pt. Waiting for other responsible adult to care for child in order to give some pain med.

## 2013-01-17 NOTE — ED Notes (Signed)
RN called for pt in waiting rm. No answer.

## 2013-01-18 ENCOUNTER — Emergency Department (HOSPITAL_COMMUNITY)
Admission: EM | Admit: 2013-01-18 | Discharge: 2013-01-18 | Disposition: A | Discharge status: Home / Self Care | Payer: Self-pay | Attending: Emergency Medicine | Admitting: Emergency Medicine

## 2013-01-18 ENCOUNTER — Emergency Department (HOSPITAL_COMMUNITY): Payer: Self-pay

## 2013-01-18 ENCOUNTER — Encounter (HOSPITAL_COMMUNITY): Payer: Self-pay | Admitting: *Deleted

## 2013-01-18 DIAGNOSIS — R110 Nausea: Secondary | ICD-10-CM | POA: Insufficient documentation

## 2013-01-18 DIAGNOSIS — F17200 Nicotine dependence, unspecified, uncomplicated: Secondary | ICD-10-CM | POA: Insufficient documentation

## 2013-01-18 DIAGNOSIS — R300 Dysuria: Secondary | ICD-10-CM | POA: Insufficient documentation

## 2013-01-18 DIAGNOSIS — N12 Tubulo-interstitial nephritis, not specified as acute or chronic: Secondary | ICD-10-CM | POA: Insufficient documentation

## 2013-01-18 DIAGNOSIS — Z87448 Personal history of other diseases of urinary system: Secondary | ICD-10-CM | POA: Insufficient documentation

## 2013-01-18 DIAGNOSIS — Z8742 Personal history of other diseases of the female genital tract: Secondary | ICD-10-CM | POA: Insufficient documentation

## 2013-01-18 DIAGNOSIS — Z3202 Encounter for pregnancy test, result negative: Secondary | ICD-10-CM | POA: Insufficient documentation

## 2013-01-18 DIAGNOSIS — R350 Frequency of micturition: Secondary | ICD-10-CM | POA: Insufficient documentation

## 2013-01-18 DIAGNOSIS — Z87440 Personal history of urinary (tract) infections: Secondary | ICD-10-CM | POA: Insufficient documentation

## 2013-01-18 LAB — URINALYSIS, ROUTINE W REFLEX MICROSCOPIC
Bilirubin Urine: NEGATIVE
Nitrite: POSITIVE — AB
Specific Gravity, Urine: 1.016 (ref 1.005–1.030)
Urobilinogen, UA: 0.2 mg/dL (ref 0.0–1.0)
pH: 5.5 (ref 5.0–8.0)

## 2013-01-18 LAB — POCT PREGNANCY, URINE: Preg Test, Ur: NEGATIVE

## 2013-01-18 LAB — URINE MICROSCOPIC-ADD ON

## 2013-01-18 MED ORDER — PROMETHAZINE HCL 12.5 MG PO TABS: 25.0000 mg | ORAL_TABLET | Freq: Three times a day (TID) | ORAL | Status: DC | PRN

## 2013-01-18 MED ORDER — HYDROCODONE-ACETAMINOPHEN 5-325 MG PO TABS: 1.0000 | ORAL_TABLET | ORAL | Status: DC | PRN

## 2013-01-18 MED ORDER — HYDROMORPHONE HCL PF 1 MG/ML IJ SOLN
1.0000 mg | Freq: Once | INTRAMUSCULAR | Status: AC
  Administered 2013-01-18: 1 mg via INTRAVENOUS
  Filled 2013-01-18: qty 1

## 2013-01-18 MED ORDER — CIPROFLOXACIN HCL 500 MG PO TABS: 500.0000 mg | ORAL_TABLET | Freq: Two times a day (BID) | ORAL | Status: DC

## 2013-01-18 MED ORDER — MORPHINE SULFATE 4 MG/ML IJ SOLN
4.0000 mg | Freq: Once | INTRAMUSCULAR | Status: AC
  Administered 2013-01-18: 4 mg via INTRAVENOUS
  Filled 2013-01-18: qty 1

## 2013-01-18 MED ORDER — ONDANSETRON HCL 4 MG/2ML IJ SOLN
4.0000 mg | Freq: Once | INTRAMUSCULAR | Status: AC
  Administered 2013-01-18: 4 mg via INTRAVENOUS
  Filled 2013-01-18: qty 2

## 2013-01-18 MED ORDER — SODIUM CHLORIDE 0.9 % IV BOLUS (SEPSIS)
1000.0000 mL | Freq: Once | INTRAVENOUS | Status: AC
  Administered 2013-01-18: 1000 mL via INTRAVENOUS

## 2013-01-18 NOTE — Progress Notes (Signed)
Patient currently without health insurance and having difficulty obtaining medications.  ED CM saw patient at bedside.  Gave patient information on the [facility] program to help acquire medications.  Patient verbalized understanding and grateful for the assistance.  No further needs to address at this time.  Patient is being discharged home.

## 2013-01-18 NOTE — ED Notes (Addendum)
Pt c/o left side abdominal and back pain since yesterday. Nothing makes pain better or worse. Pt c/o nausea, denies vomiting. Pt c/o difficulty urinating. Denies vaginal d/c. Pt here yesterday for same complaint, LWBS. UA done yesterday.

## 2013-01-18 NOTE — ED Provider Notes (Signed)
History     CSN: [id number]  Arrival date & time 01/18/13  1037   First MD Initiated Contact with Patient 01/18/13 1119      Chief Complaint  Patient presents with  . Abdominal Pain    (Consider location/radiation/quality/duration/timing/severity/associated sxs/prior treatment) HPI Comments: 27 year old female presents to the emergency department complaining of gradual onset left-sided abdominal pain beginning around 4:00 in the afternoon one day ago. Pain is constant, described as sharp, radiating to her back rated 8/10. Initially she had a dull ache in the left side of her abdomen about 6 days ago. Admits to associated nausea, dysuria, increased urinary frequency and urgency. She has not had any alleviating factors for her pain. "Moving fast" makes the pain worse. The symptoms do not feel like her previous urinary tract infections. Denies fever, chills, vomiting, diarrhea, vaginal bleeding or discharge. Last menstrual period was April 12. Admits to being sexually active with condom usage. No history of kidney stones.  Patient is a 27 y.o. female presenting with abdominal pain. The history is provided by the patient.  Abdominal Pain Associated symptoms: dysuria and nausea   Associated symptoms: no chills, no constipation, no diarrhea, no fever, no hematuria, no vaginal bleeding, no vaginal discharge and no vomiting     Past Medical History  Diagnosis Date  . UTI (lower urinary tract infection)   . BV (bacterial vaginosis)   . UTI (lower urinary tract infection)   . Kidney infection     Past Surgical History  Procedure Laterality Date  . Cesarean section      x3    Family History  Problem Relation Age of Onset  . Hypertension Mother   . Heart disease Father   . Asthma Brother     History  Substance Use Topics  . Smoking status: Current Some Day Smoker  . Smokeless tobacco: Never Used  . Alcohol Use: No    OB History   Grav Para Term Preterm Abortions TAB SAB Ect  Mult Living   4 3 1 2 1 1   0 0 3      Review of Systems  Constitutional: Negative for fever and chills.  Gastrointestinal: Positive for nausea and abdominal pain. Negative for vomiting, diarrhea and constipation.  Genitourinary: Positive for dysuria, urgency and frequency. Negative for hematuria, vaginal bleeding, vaginal discharge, vaginal pain and menstrual problem.  All other systems reviewed and are negative.    Allergies  Review of patient's allergies indicates no known allergies.  Home Medications   Current Outpatient Rx  Name  Route  Sig  Dispense  Refill  . Aspirin-Salicylamide-Caffeine (BC HEADACHE POWDER PO)   Oral   Take 1 Package by mouth as needed.         . ciprofloxacin (CIPRO) 500 MG tablet   Oral   Take 1 tablet (500 mg total) by mouth 2 (two) times daily.   6 tablet   0   . ibuprofen (ADVIL,MOTRIN) 800 MG tablet   Oral   Take 800 mg by mouth every 8 (eight) hours as needed for pain.           BP 127/91  Pulse 70  Temp(Src) 98.5 F (36.9 C) (Oral)  Resp 20  SpO2 98%  LMP 01/01/2013  Physical Exam  Nursing note and vitals reviewed. Constitutional: She is oriented to person, place, and time. She appears well-developed and well-nourished. No distress.  HENT:  Head: Normocephalic and atraumatic.  Mouth/Throat: Oropharynx is clear and moist.  Eyes: Conjunctivae  are normal.  Neck: Normal range of motion. Neck supple.  Cardiovascular: Normal rate, regular rhythm and normal heart sounds.   Pulmonary/Chest: Effort normal and breath sounds normal. No respiratory distress.  Abdominal: Normal appearance and bowel sounds are normal. There is tenderness. There is CVA tenderness (left).    Musculoskeletal: Normal range of motion. She exhibits no edema.  Neurological: She is alert and oriented to person, place, and time.  Skin: Skin is warm and dry. She is not diaphoretic.  Psychiatric: She has a normal mood and affect. Her behavior is normal.    ED  Course  Procedures (including critical care time)  Labs Reviewed  URINALYSIS, ROUTINE W REFLEX MICROSCOPIC   Ct Abdomen Pelvis Wo Contrast  01/18/2013  *RADIOLOGY REPORT*  Clinical Data: Left-sided pain, back pain  CT ABDOMEN AND PELVIS WITHOUT CONTRAST  Technique:  Multidetector CT imaging of the abdomen and pelvis was performed following the standard protocol without intravenous contrast.  Comparison: None.  Findings: Sagittal images of the spine are unremarkable.  Lung bases are unremarkable.  The unenhanced liver shows no biliary ductal dilatation.  Unenhanced pancreas, spleen and adrenal glands are unremarkable.  Unenhanced kidneys are symmetrical in size.  No nephrolithiasis.  No hydronephrosis or hydroureter.  No calcified ureteral calculi are noted.  No aortic aneurysm.  Gallbladder is contracted without evidence of calcified gallstones.  No small bowel obstruction.  No ascites or free air.  No adenopathy.  Normal appendix is clearly visualized in axial image 55. No pericecal inflammation.  Small pelvic free fluid noted within posterior cul-de-sac.  Unenhanced uterus is unremarkable. Unenhanced ovaries are poorly visualized.  Mild distended urinary bladder.  Pelvic phleboliths are noted.  IMPRESSION:  1.  No nephrolithiasis.  No hydronephrosis or hydroureter. 2.  Normal appendix. 3.  No calcified ureteral calculi. 4.  Small amount of pelvic free fluid within posterior cul-de-sac. 5.  Mild distended urinary bladder without evidence of calcified stones.   Original Report Authenticated By: Natasha Mead, M.D.      1. Pyelonephritis       MDM  27 y/o female with possible kidney stone. Obtaining CT abd/pelvis without contrast. 12:57 PM CT scan negative for stone. Awaiting U/A- U/A from yesterday showed infection, however left the ED before being evaluated. Pain decreased to 4/10 from 8/10 after receiving dilaudid. Concern for early pyelonephritis due to associated chills, nausea and flank  pain. 1:30 PM Care assumed by [facility] PA-C at this time. Plan is to treat for UTI/pyelo pending U/A.     Trevor Mace, PA-C 01/18/13 1956

## 2013-01-18 NOTE — ED Provider Notes (Signed)
Medical screening examination/treatment/procedure(s) were performed by non-physician practitioner and as supervising physician I was immediately available for consultation/collaboration.   Glynn Octave, MD 01/18/13 [phone]

## 2013-01-18 NOTE — ED Provider Notes (Signed)
Medical screening examination/treatment/procedure(s) were performed by non-physician practitioner and as supervising physician I was immediately available for consultation/collaboration.   Callie Facey, MD 01/18/13 2045 

## 2013-01-18 NOTE — ED Provider Notes (Signed)
1:32 PM Assumed care of patient from Lisa Vazquez, [place], who went home sick.  Patient with left flank pain and nausea.  CT negative for urolithaisis/nephrolithiasis.  Pending UA.  Pt to be treated for UTI vs pyelonephritis.    2:37 PM Pt with definite UTI but with flank pain, chills, nausea.  Likely early pyelonephritis.  Will treat with cipro, norco, phenergan.  Discussed all results with patient.  Pt given return precautions.  Pt verbalizes understanding and agrees with plan.     Results for orders placed during the hospital encounter of 01/18/13  URINALYSIS, ROUTINE W REFLEX MICROSCOPIC      Result Value Range   Color, Urine YELLOW  YELLOW   APPearance CLOUDY (*) CLEAR   Specific Gravity, Urine 1.016  1.005 - 1.030   pH 5.5  5.0 - 8.0   Glucose, UA NEGATIVE  NEGATIVE mg/dL   Hgb urine dipstick MODERATE (*) NEGATIVE   Bilirubin Urine NEGATIVE  NEGATIVE   Ketones, ur 15 (*) NEGATIVE mg/dL   Protein, ur NEGATIVE  NEGATIVE mg/dL   Urobilinogen, UA 0.2  0.0 - 1.0 mg/dL   Nitrite POSITIVE (*) NEGATIVE   Leukocytes, UA LARGE (*) NEGATIVE  URINE MICROSCOPIC-ADD ON      Result Value Range   Squamous Epithelial / LPF FEW (*) RARE   WBC, UA TOO NUMEROUS TO COUNT  <3 WBC/hpf   RBC / HPF 11-20  <3 RBC/hpf   Bacteria, UA MANY (*) RARE  POCT PREGNANCY, URINE      Result Value Range   Preg Test, Ur NEGATIVE  NEGATIVE   Ct Abdomen Pelvis Wo Contrast  01/18/2013  *RADIOLOGY REPORT*  Clinical Data: Left-sided pain, back pain  CT ABDOMEN AND PELVIS WITHOUT CONTRAST  Technique:  Multidetector CT imaging of the abdomen and pelvis was performed following the standard protocol without intravenous contrast.  Comparison: None.  Findings: Sagittal images of the spine are unremarkable.  Lung bases are unremarkable.  The unenhanced liver shows no biliary ductal dilatation.  Unenhanced pancreas, spleen and adrenal glands are unremarkable.  Unenhanced kidneys are symmetrical in size.  No nephrolithiasis.  No  hydronephrosis or hydroureter.  No calcified ureteral calculi are noted.  No aortic aneurysm.  Gallbladder is contracted without evidence of calcified gallstones.  No small bowel obstruction.  No ascites or free air.  No adenopathy.  Normal appendix is clearly visualized in axial image 55. No pericecal inflammation.  Small pelvic free fluid noted within posterior cul-de-sac.  Unenhanced uterus is unremarkable. Unenhanced ovaries are poorly visualized.  Mild distended urinary bladder.  Pelvic phleboliths are noted.  IMPRESSION:  1.  No nephrolithiasis.  No hydronephrosis or hydroureter. 2.  Normal appendix. 3.  No calcified ureteral calculi. 4.  Small amount of pelvic free fluid within posterior cul-de-sac. 5.  Mild distended urinary bladder without evidence of calcified stones.   Original Report Authenticated By: Natasha Mead, M.D.       Trixie Dredge, PA-C 01/18/13 1437

## 2013-01-18 NOTE — ED Notes (Signed)
To ED for eval of llq pain that radiates to left back. Seen in ED last night and gave urine sample. Pt had to leave prior to seeing MD. No vomiting. Nauseated. Tearful.

## 2013-01-18 NOTE — ED Notes (Signed)
  Pt transported to ct 

## 2013-01-19 ENCOUNTER — Telehealth: Payer: Self-pay | Admitting: *Deleted

## 2013-01-19 NOTE — Telephone Encounter (Signed)
Message copied by Mannie Stabile on Wed Jan 19, 2013  1:43 PM ------      Message from: Freddi Starr      Created: Sun Jan 16, 2013  8:02 PM       Rx for cipro sent to patient's pharmacy. Please call patient and inform her of UTI dx and Rx. She also needs to be informed not to take the [facility] powder (goody powder) while on Cipro. Thanks! ------

## 2013-01-19 NOTE — Telephone Encounter (Signed)
Called patient and informed her of her results. Also explained the colpo procedure to patient. She is scheduled to come in on 02/09/13 at 200.

## 2013-01-19 NOTE — Telephone Encounter (Signed)
Message copied by Mannie Stabile on Wed Jan 19, 2013  1:43 PM ------      Message from: Freddi Starr      Created: Wed Jan 19, 2013  8:18 AM       Patient needs colposcopy scheduled for abnormal pap smear results. Please inform patient of pap results and appointment. Thanks! ------

## 2013-01-20 LAB — URINE CULTURE: Colony Count: >=100000

## 2013-01-21 LAB — URINE CULTURE

## 2013-01-21 NOTE — ED Notes (Signed)
+   Urine Patient treated with cipro-sensitive to same-chart appended per protocol MD. 

## 2013-01-22 ENCOUNTER — Telehealth (HOSPITAL_COMMUNITY): Payer: Self-pay | Admitting: Emergency Medicine

## 2013-01-22 NOTE — ED Notes (Signed)
+  Urine. Patient treated with Cipro on 4/29 visit. Sensitive to same for urine cultures on 4/28 & 4/29. Per protocol MD.

## 2013-01-22 NOTE — ED Notes (Signed)
Patient has +Urine culture. °

## 2013-01-27 ENCOUNTER — Ambulatory Visit: Payer: Self-pay

## 2013-02-09 ENCOUNTER — Encounter: Payer: Self-pay | Admitting: Obstetrics & Gynecology

## 2013-03-15 ENCOUNTER — Inpatient Hospital Stay (HOSPITAL_COMMUNITY)
Admission: AD | Admit: 2013-03-15 | Discharge: 2013-03-15 | Disposition: A | Discharge status: Home / Self Care | Payer: Self-pay | Source: Ambulatory Visit | Attending: Obstetrics & Gynecology | Admitting: Obstetrics & Gynecology

## 2013-03-15 ENCOUNTER — Encounter (HOSPITAL_COMMUNITY): Payer: Self-pay | Admitting: *Deleted

## 2013-03-15 ENCOUNTER — Inpatient Hospital Stay (HOSPITAL_COMMUNITY): Payer: Self-pay

## 2013-03-15 DIAGNOSIS — N949 Unspecified condition associated with female genital organs and menstrual cycle: Secondary | ICD-10-CM | POA: Insufficient documentation

## 2013-03-15 DIAGNOSIS — N9489 Other specified conditions associated with female genital organs and menstrual cycle: Secondary | ICD-10-CM | POA: Insufficient documentation

## 2013-03-15 DIAGNOSIS — N760 Acute vaginitis: Secondary | ICD-10-CM | POA: Insufficient documentation

## 2013-03-15 DIAGNOSIS — N838 Other noninflammatory disorders of ovary, fallopian tube and broad ligament: Secondary | ICD-10-CM

## 2013-03-15 DIAGNOSIS — R109 Unspecified abdominal pain: Secondary | ICD-10-CM | POA: Insufficient documentation

## 2013-03-15 DIAGNOSIS — A499 Bacterial infection, unspecified: Secondary | ICD-10-CM | POA: Insufficient documentation

## 2013-03-15 DIAGNOSIS — B9689 Other specified bacterial agents as the cause of diseases classified elsewhere: Secondary | ICD-10-CM | POA: Insufficient documentation

## 2013-03-15 HISTORY — DX: Essential (primary) hypertension: I10

## 2013-03-15 LAB — URINALYSIS, ROUTINE W REFLEX MICROSCOPIC
Bilirubin Urine: NEGATIVE
Hgb urine dipstick: NEGATIVE
Ketones, ur: NEGATIVE mg/dL
Specific Gravity, Urine: 1.01 (ref 1.005–1.030)
Urobilinogen, UA: 0.2 mg/dL (ref 0.0–1.0)
pH: 6 (ref 5.0–8.0)

## 2013-03-15 LAB — WET PREP, GENITAL: Trich, Wet Prep: NONE SEEN

## 2013-03-15 MED ORDER — IBUPROFEN 800 MG PO TABS
800.0000 mg | ORAL_TABLET | ORAL | Status: AC
  Administered 2013-03-15: 800 mg via ORAL
  Filled 2013-03-15: qty 1

## 2013-03-15 MED ORDER — METRONIDAZOLE 500 MG PO TABS: 500.0000 mg | ORAL_TABLET | Freq: Two times a day (BID) | ORAL | Status: DC

## 2013-03-15 NOTE — MAU Provider Note (Signed)

## 2013-03-15 NOTE — MAU Note (Signed)
Patient states she has been having vaginal pressure mostly after intercourse for about one year, feels like her cervix is falling out. Has a clear discharge with an odor and today is having abdominal pain.

## 2013-03-15 NOTE — MAU Provider Note (Signed)
Chief Complaint: Abdominal Pain and Vaginal Discharge   First Provider Initiated Contact with Patient 03/15/13 1443     SUBJECTIVE HPI: Lisa Vazquez is a 27 y.o. [id number] who presents to maternity admissions reporting abdominal cramping and "feeling like something is falling out of my vagina" that has been going on for months after sex but started this week without intercourse, and is worse per the pt.  She reports vaginal bleeding following intercourse recently, and odor with clear vaginal discharge.  She denies urinary symptoms, h/a, dizziness, n/v, or fever/chills.     Past Medical History  Diagnosis Date  . UTI (lower urinary tract infection)   . BV (bacterial vaginosis)   . UTI (lower urinary tract infection)   . Kidney infection   . Hypertension    Past Surgical History  Procedure Laterality Date  . Cesarean section      x3   History   Social History  . Marital Status: Single    Spouse Name: N/A    Number of Children: N/A  . Years of Education: N/A   Occupational History  . Not on file.   Social History Main Topics  . Smoking status: Current Some Day Smoker -- 0.25 packs/day  . Smokeless tobacco: Never Used  . Alcohol Use: No  . Drug Use: No  . Sexually Active: Yes    Birth Control/ Protection: Condom   Other Topics Concern  . Not on file   Social History Narrative  . No narrative on file   No current facility-administered medications on file prior to encounter.   No current outpatient prescriptions on file prior to encounter.   No Known Allergies  ROS: Pertinent items in HPI  OBJECTIVE Blood pressure 145/101, pulse 72, temperature 98.6 F (37 C), temperature source Oral, resp. rate 18, height 5\' 5"  (1.651 m), weight 64.32 kg (141 lb 12.8 oz), last menstrual period 02/22/2013, SpO2 100.00%. GENERAL: Well-developed, well-nourished female in no acute distress.  HEENT: Normocephalic HEART: normal rate RESP: normal effort ABDOMEN: Soft,  non-tender EXTREMITIES: Nontender, no edema NEURO: Alert and oriented Pelvic exam: Cervix pink, visually closed, without lesion, moderate amount thin clear discharge, vaginal walls and external genitalia normal, no evidence of prolapse or suspicious mass Bimanual exam: Cervix 0/long/high, firm, posterior, neg CMT, uterus mildly tender, nonenlarged, adnexa without tenderness, enlargement, or mass  LAB RESULTS Results for orders placed during the hospital encounter of 03/15/13 (from the past 24 hour(s))  URINALYSIS, ROUTINE W REFLEX MICROSCOPIC     Status: None   Collection Time    03/15/13  1:00 PM      Result Value Range   Color, Urine YELLOW  YELLOW   APPearance CLEAR  CLEAR   Specific Gravity, Urine 1.010  1.005 - 1.030   pH 6.0  5.0 - 8.0   Glucose, UA NEGATIVE  NEGATIVE mg/dL   Hgb urine dipstick NEGATIVE  NEGATIVE   Bilirubin Urine NEGATIVE  NEGATIVE   Ketones, ur NEGATIVE  NEGATIVE mg/dL   Protein, ur NEGATIVE  NEGATIVE mg/dL   Urobilinogen, UA 0.2  0.0 - 1.0 mg/dL   Nitrite NEGATIVE  NEGATIVE   Leukocytes, UA NEGATIVE  NEGATIVE  POCT PREGNANCY, URINE     Status: None   Collection Time    03/15/13  1:24 PM      Result Value Range   Preg Test, Ur NEGATIVE  NEGATIVE  WET PREP, GENITAL     Status: Abnormal   Collection Time    03/15/13  3:00 PM      Result Value Range   Yeast Wet Prep HPF POC NONE SEEN  NONE SEEN   Trich, Wet Prep NONE SEEN  NONE SEEN   Clue Cells Wet Prep HPF POC MODERATE (*) NONE SEEN   WBC, Wet Prep HPF POC FEW (*) NONE SEEN    IMAGING US Transvaginal Non-ob  03/15/2013   *RADIOLOGY REPORT*  Clinical Data: Pelvic pain with irregular vaginal bleeding.  LMP 02/22/2013  TRANSABDOMINAL AND TRANSVAGINAL ULTRASOUND OF PELVIS Technique:  Both transabdominal and transvaginal ultrasound examinations of the pelvis were performed. Transabdominal technique was performed for global imaging of the pelvis including uterus, ovaries, adnexal regions, and pelvic  cul-de-sac.  It was necessary to proceed with endovaginal exam following the transabdominal exam to visualize the myometrium, endometrium and adnexa.  Comparison:  Findings:  Uterus: Is anteverted and anteflexed demonstrates a sagittal length of 8.5 cm, depth of 4.2 cm and width of 5.5 cm.  A homogeneous myometrium is seen.  Endometrium: Appears trilayered with a width of 7 mm.  No areas of focal thickening or heterogeneity are seen and this would correlate with a periovulatory endometrial stripe and correspond with the provided LMP of 02/22/2013  Right ovary:  Measures 5.5 x 2.7 x 4.7 cm and contains too small complex cystic lesions which demonstrate peripheral flow and central avascular strandy soft tissue.  The appearance is suspicious for to the hemorrhagic corpus lutei.  A collapsing benign appearing simple cyst measuring 3.0 x 1.8 x 3.1 cm is also visualized.  Left ovary: Has a normal appearance measuring 3.0 x 2.0 x 2.1 cm  Other findings: A small amount of simple free fluid is noted adjacent to the left ovary  IMPRESSION: Normal uterine myometrium, left ovary and periovulatory endometrial stripe  The right ovary demonstrates two small complex cysts which have a sonographic appearance most suspicious for hemorrhagic corpus lutei as well as a benign appearing collapsing simple cyst.  Given the history of pelvic pain, follow-up can be performed in the immediate postsecretory phase of the cycle following the next complete cycle if desired to exclude the possibility that one or both of the complex cysts may represent unusual endometrioma.   Original Report Authenticated By: Rhodia Albright, M.D.   US Pelvis Complete  03/15/2013   *RADIOLOGY REPORT*  Clinical Data: Pelvic pain with irregular vaginal bleeding.  LMP 02/22/2013  TRANSABDOMINAL AND TRANSVAGINAL ULTRASOUND OF PELVIS Technique:  Both transabdominal and transvaginal ultrasound examinations of the pelvis were performed. Transabdominal technique was  performed for global imaging of the pelvis including uterus, ovaries, adnexal regions, and pelvic cul-de-sac.  It was necessary to proceed with endovaginal exam following the transabdominal exam to visualize the myometrium, endometrium and adnexa.  Comparison:  Findings:  Uterus: Is anteverted and anteflexed demonstrates a sagittal length of 8.5 cm, depth of 4.2 cm and width of 5.5 cm.  A homogeneous myometrium is seen.  Endometrium: Appears trilayered with a width of 7 mm.  No areas of focal thickening or heterogeneity are seen and this would correlate with a periovulatory endometrial stripe and correspond with the provided LMP of 02/22/2013  Right ovary:  Measures 5.5 x 2.7 x 4.7 cm and contains too small complex cystic lesions which demonstrate peripheral flow and central avascular strandy soft tissue.  The appearance is suspicious for to the hemorrhagic corpus lutei.  A collapsing benign appearing simple cyst measuring 3.0 x 1.8 x 3.1 cm is also visualized.  Left ovary: Has a normal appearance  measuring 3.0 x 2.0 x 2.1 cm  Other findings: A small amount of simple free fluid is noted adjacent to the left ovary  IMPRESSION: Normal uterine myometrium, left ovary and periovulatory endometrial stripe  The right ovary demonstrates two small complex cysts which have a sonographic appearance most suspicious for hemorrhagic corpus lutei as well as a benign appearing collapsing simple cyst.  Given the history of pelvic pain, follow-up can be performed in the immediate postsecretory phase of the cycle following the next complete cycle if desired to exclude the possibility that one or both of the complex cysts may represent unusual endometrioma.   Original Report Authenticated By: Rhodia Albright, M.D.    ASSESSMENT 1. Complex cyst of uterine adnexa   2. BV (bacterial vaginosis)     PLAN Discharge home Flagyl 500 mg BID x7 days, and recommended probiotics for prevention Pt has appointment in clinic for  colposcopy.  Message sent to clinic for f/u regarding cysts. Pt given contact information for [facility] for financial assistance for colpo Return to MAU as needed    Medication List     As of 03/15/2013  2:53 PM    Notice      You have not been prescribed any medications.          Sharen Counter Certified Nurse-Midwife 03/15/2013  2:53 PM

## 2013-03-16 LAB — GC/CHLAMYDIA PROBE AMP
CT Probe RNA: NEGATIVE
GC Probe RNA: NEGATIVE

## 2013-03-17 ENCOUNTER — Encounter: Payer: Self-pay | Admitting: Obstetrics and Gynecology

## 2013-03-17 ENCOUNTER — Ambulatory Visit (INDEPENDENT_AMBULATORY_CARE_PROVIDER_SITE_OTHER): Payer: Self-pay | Admitting: Obstetrics and Gynecology

## 2013-03-17 ENCOUNTER — Other Ambulatory Visit (HOSPITAL_COMMUNITY)
Admission: RE | Admit: 2013-03-17 | Discharge: 2013-03-17 | Disposition: A | Discharge status: Home / Self Care | Payer: Self-pay | Source: Ambulatory Visit | Attending: Obstetrics and Gynecology | Admitting: Obstetrics and Gynecology

## 2013-03-17 VITALS — BP 142/100 | HR 74 | Temp 98.1°F | Ht 64.0 in | Wt 140.0 lb

## 2013-03-17 DIAGNOSIS — N879 Dysplasia of cervix uteri, unspecified: Secondary | ICD-10-CM | POA: Insufficient documentation

## 2013-03-17 DIAGNOSIS — R87610 Atypical squamous cells of undetermined significance on cytologic smear of cervix (ASC-US): Secondary | ICD-10-CM

## 2013-03-17 DIAGNOSIS — R87810 Cervical high risk human papillomavirus (HPV) DNA test positive: Secondary | ICD-10-CM | POA: Insufficient documentation

## 2013-03-17 HISTORY — DX: Atypical squamous cells of undetermined significance on cytologic smear of cervix (ASC-US): R87.610

## 2013-03-17 NOTE — Progress Notes (Signed)
Patient ID: Lisa Vazquez, female   DOB: 01/02/1986, 27 y.o.   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  Placed in lithotomy position. Cervix viewed with speculum and colposcope after application of acetic acid.   Colposcopy adequate?  No. TZ not visualized Acetowhite lesions?yes 12 o'clock Punctation?no Mosaicism?  no Abnormal vasculature?  no Biopsies?yes ECC?yes  COMMENTS: Patient was given post procedure instructions.  She will return in 2 weeks for results.  Patient was seen in MAU on 6/24 and noted to have right ovarian cyst. F/U ultrasound will be ordered in 4-6 weeks Referral to [facility] for management of HTN provided

## 2013-03-18 ENCOUNTER — Telehealth: Payer: Self-pay | Admitting: *Deleted

## 2013-03-18 NOTE — Telephone Encounter (Signed)
Patient left a message that she has a cyst on her ovary. She would like a call back.

## 2013-03-22 ENCOUNTER — Encounter: Payer: Self-pay | Admitting: *Deleted

## 2013-03-22 ENCOUNTER — Telehealth: Payer: Self-pay | Admitting: *Deleted

## 2013-03-22 NOTE — Telephone Encounter (Addendum)
Message copied by Jill Side on Tue Mar 22, 2013 12:18 PM ------      Message from: CONSTANT, Gigi Gin      Created: Tue Mar 22, 2013  9:14 AM       Please inform patient of colposcopy result consistent with pap smear. Please stress the importance that patient keeps a 6 month follow up appointment for repeat pap smear            Peggy ------ Called pt and heard message stating that the subscriber's phone does not accept incoming calls.  Certified letter sent to pt.

## 2013-03-24 NOTE — Telephone Encounter (Signed)
Called patient and heard phone message this person is not accepting incoming calls.

## 2013-03-28 ENCOUNTER — Encounter: Payer: Self-pay | Admitting: *Deleted

## 2013-03-28 ENCOUNTER — Telehealth: Payer: Self-pay | Admitting: *Deleted

## 2013-03-28 NOTE — Telephone Encounter (Signed)
Scheduled [place] for 04/18/13 and called Anjolina and left a message of the appointment date/time

## 2013-03-28 NOTE — Telephone Encounter (Signed)
Called Lisa Vazquez and she states she has had problems with BV and then told has cyst and wants to know why can't something be done about it , states she has had it for 2 years and was told she has to wait and come back in 6 months. Explained to her people can have reoccuring ovarian cysts and reviewed her visit notes- plan was for her to come back in 2 weeks for results of colposcopy and also to have pelvic ultrasound in 4-6 weeks to follow ovarian cyst.  Patient did not have any appointments made. We discussed hpv results, ovarian cyst usual plan of care which is follow up with ultrasound and if not improving or pain may need treatment.  Explained to patient we will schedule her an appointment for ultrasound and call her back. Also reviewed results of her colposcopy with her and will have front desk call her with an results appointment for first of August to review results of colposcopy and results of pelvic ultrasound and plan of care for both( patient is self pay and wants least number of appointments possible)Instructed patient to callus back if she does not get the 2 appointments in the next few days. Patient voices understanding.

## 2013-03-28 NOTE — Telephone Encounter (Signed)
Lisa Vazquez called and left a message stating she is calling re: results- needs clarity on them.

## 2013-03-31 ENCOUNTER — Telehealth: Payer: Self-pay | Admitting: General Practice

## 2013-03-31 NOTE — Telephone Encounter (Signed)
Pt scheduled appt

## 2013-04-06 ENCOUNTER — Ambulatory Visit: Payer: Self-pay | Attending: Family Medicine | Admitting: Family Medicine

## 2013-04-06 ENCOUNTER — Encounter: Payer: Self-pay | Admitting: Family Medicine

## 2013-04-06 VITALS — BP 152/97 | HR 71 | Temp 98.7°F | Resp 16 | Ht 64.0 in | Wt 143.0 lb

## 2013-04-06 DIAGNOSIS — N870 Mild cervical dysplasia: Secondary | ICD-10-CM

## 2013-04-06 DIAGNOSIS — IMO0002 Reserved for concepts with insufficient information to code with codable children: Secondary | ICD-10-CM

## 2013-04-06 DIAGNOSIS — N760 Acute vaginitis: Secondary | ICD-10-CM | POA: Insufficient documentation

## 2013-04-06 DIAGNOSIS — B9689 Other specified bacterial agents as the cause of diseases classified elsewhere: Secondary | ICD-10-CM

## 2013-04-06 DIAGNOSIS — B977 Papillomavirus as the cause of diseases classified elsewhere: Secondary | ICD-10-CM

## 2013-04-06 DIAGNOSIS — A499 Bacterial infection, unspecified: Secondary | ICD-10-CM | POA: Insufficient documentation

## 2013-04-06 DIAGNOSIS — R6889 Other general symptoms and signs: Secondary | ICD-10-CM

## 2013-04-06 HISTORY — DX: Other specified bacterial agents as the cause of diseases classified elsewhere: B96.89

## 2013-04-06 HISTORY — DX: Other specified bacterial agents as the cause of diseases classified elsewhere: N76.0

## 2013-04-06 HISTORY — DX: Mild cervical dysplasia: N87.0

## 2013-04-06 LAB — LIPID PANEL
Cholesterol: 154 mg/dL (ref 0–200)
VLDL: 8 mg/dL (ref 0–40)

## 2013-04-06 LAB — POCT URINALYSIS DIPSTICK
Bilirubin, UA: NEGATIVE
Ketones, UA: NEGATIVE
Spec Grav, UA: 1.02
pH, UA: 6

## 2013-04-06 MED ORDER — HYDROCHLOROTHIAZIDE 12.5 MG PO TABS: 12.5000 mg | ORAL_TABLET | Freq: Every day | ORAL | Status: DC

## 2013-04-06 MED ORDER — SULFAMETHOXAZOLE-TRIMETHOPRIM 800-160 MG PO TABS: 1.0000 | ORAL_TABLET | Freq: Two times a day (BID) | ORAL | Status: DC

## 2013-04-06 NOTE — Progress Notes (Signed)
PT HERE TO ESTABLISH CARE. HX HTN, R OVARIAN CYST,FRQUENT BV'S.C/O L LABIA SWELLING WITH SHARP, SHOOTING INTERMIT PAIN,NAUSEA.DENIES FEVER,CHILLS. ALSO HERE FOR HTN EVAL. NOT TAKING MEDS, C/O CONSTANT FRONTAL H/A'S.VSS LMP 03/29/13

## 2013-04-06 NOTE — Patient Instructions (Addendum)
Hypertension As your heart beats, it forces blood through your arteries. This force is your blood pressure. If the pressure is too high, it is called hypertension (HTN) or high blood pressure. HTN is dangerous because you may have it and not know it. High blood pressure may mean that your heart has to work harder to pump blood. Your arteries may be narrow or stiff. The extra work puts you at risk for heart disease, stroke, and other problems.  Blood pressure consists of two numbers, a higher number over a lower, 110/72, for example. It is stated as "110 over 72." The ideal is below 120 for the top number (systolic) and under 80 for the bottom (diastolic). Write down your blood pressure today. You should pay close attention to your blood pressure if you have certain conditions such as:  Heart failure.  Prior heart attack.  Diabetes  Chronic kidney disease.  Prior stroke.  Multiple risk factors for heart disease. To see if you have HTN, your blood pressure should be measured while you are seated with your arm held at the level of the heart. It should be measured at least twice. A one-time elevated blood pressure reading (especially in the Emergency Department) does not mean that you need treatment. There may be conditions in which the blood pressure is different between your right and left arms. It is important to see your caregiver soon for a recheck. Most people have essential hypertension which means that there is not a specific cause. This type of high blood pressure may be lowered by changing lifestyle factors such as:  Stress.  Smoking.  Lack of exercise.  Excessive weight.  Drug/tobacco/alcohol use.  Eating less salt. Most people do not have symptoms from high blood pressure until it has caused damage to the body. Effective treatment can often prevent, delay or reduce that damage. TREATMENT  When a cause has been identified, treatment for high blood pressure is directed at the  cause. There are a large number of medications to treat HTN. These fall into several categories, and your caregiver will help you select the medicines that are best for you. Medications may have side effects. You should review side effects with your caregiver. If your blood pressure stays high after you have made lifestyle changes or started on medicines,   Your medication(s) may need to be changed.  Other problems may need to be addressed.  Be certain you understand your prescriptions, and know how and when to take your medicine.  Be sure to follow up with your caregiver within the time frame advised (usually within two weeks) to have your blood pressure rechecked and to review your medications.  If you are taking more than one medicine to lower your blood pressure, make sure you know how and at what times they should be taken. Taking two medicines at the same time can result in blood pressure that is too low. SEEK IMMEDIATE MEDICAL CARE IF:  You develop a severe headache, blurred or changing vision, or confusion.  You have unusual weakness or numbness, or a faint feeling.  You have severe chest or abdominal pain, vomiting, or breathing problems. MAKE SURE YOU:   Understand these instructions.  Will watch your condition.  Will get help right away if you are not doing well or get worse. Document Released: 09/08/2005 Document Revised: 12/01/2011 Document Reviewed: 04/28/2008 [facility] Patient Information 2014 [place], [place].  Human Papillomavirus HPV stands for human papillomavirus. There are many different types of HPV. People  of all ages and races can get an HPV infection. In females, some types of HPV can cause warts on the sex organs or anus. Some females never see any warts on the outside, yet still have the infection inside. The doctor will need to check the sex organs and do a Pap test to see there is an HPV infection. The only sign of infection may be a Pap test that is  abnormal. A female who has HPV has a higher chance of getting cancer of the sex organs or anus. It is very rare, but some females can give their babies the HPV infection when the baby is being born. Some of these babies can grow warts on the voice box (vocal cords). Males with HPV have a higher chance of getting cancer of the penis or anus. A female may have HPV but not see any warts on his penis or anus. There is no test to find HPV in males, so even if warts are not seen, there may still be an infection. HOME CARE   Take medicines as told by your doctor.  Get needed Pap tests.  Keep follow-up exams.  Do not touch or scratch the warts.  Do not treat warts with medicines used for treating hand warts.  Tell your sex partner about your infection because he or she may also need treatment.  Do not have sex while you are getting treatment.  After treatment, use condoms during sex.  Use over-the-counter creams for itching as told by your doctor.  Use over-the-counter or prescription medicines for pain, discomfort or fever as told by your doctor.  Do not douche or use tampons during treatment of HPV. GET HELP RIGHT AWAY IF:  You have a temperature by mouth above 102 F (38.9 C), not controlled by medicine. MAKE SURE YOU:   Understand these instructions.  Will watch your condition.  Will get help right away if you are not doing well or get worse. Document Released: 08/21/2008 Document Revised: 12/01/2011 Document Reviewed: 08/21/2008 [facility] Patient Information 2014 [place], [place].   HPV Test The HPV (human papillomavirus) test is used to screen for high-risk types with HPV infection. HPV is a group of about 100 related viruses, of which 40 types are genital viruses. Most HPV viruses cause infections that usually resolve without treatment within 2 years. Some HPV infections can cause skin and genital warts (condylomata). HPV types 16, 18, 31 and 45 are considered high-risk types of  HPV. High-risk types of HPV do not usually cause visible warts, but if untreated, may lead to cancers of the outlet of the womb (cervix) or anus. An HPV test identifies the DNA (genetic) strands of the HPV infection. Because the test identifies the DNA strands, the test is also referred to as the HPV DNA test. Although HPV is found in both males and females, the HPV test is only used to screen for cervical cancer in females. This test is recommended for females:  With an abnormal Pap test.  After treatment of an abnormal Pap test.  Aged 52 and older.  After treatment of a high-risk HPV infection. The HPV test may be done at the same time as a Pap test in females over the age of 57. Both the HPV and Pap test require a sample of cells from the cervix. PREPARATION FOR TEST  You may be asked to avoid douching, tampons, or vaginal medicines for 48 hours before the HPV test. You will be asked to urinate before  the test. For the HPV test, you will need to lie on an exam table with your feet in stirrups. A spatula will be inserted into the vagina. The spatula will be used to swab the cervix for a cell and mucus sample. The sample will be further evaluated in a lab under a microscope. NORMAL FINDINGS  Normal: High-risk HPV is not found.  Ranges for normal findings may vary among different laboratories and hospitals. You should always check with your doctor after having lab work or other tests done to discuss the meaning of your test results and whether your values are considered within normal limits. MEANING OF TEST An abnormal HPV test means that high-risk HPV is found. Your caregiver may recommend further testing. Your caregiver will go over the test results with you. He or she will and discuss the importance and meaning of your results, as well as treatment options and the need for additional tests, if necessary. OBTAINING THE RESULTS  It is your responsibility to obtain your test results. Ask the lab  or department performing the test when and how you will get your results. Document Released: 10/03/2004 Document Revised: 12/01/2011 Document Reviewed: 06/18/2005 [facility] Patient Information 2014 [place], [place].

## 2013-04-06 NOTE — Progress Notes (Signed)
Patient ID: Lisa Vazquez, female   DOB: Sep 11, 1986, 27 y.o.   MRN: [id number]  CC: establish   HPI: Pt reporting that she has had recurrent bacterial vaginosis infections and reporting that she has been diagnosed with an abnormal PAP recently and had coloposcopy done.  She says that she is doing well but having bleeding after intercourse.  She is also having difficulty with getting the vaginal discharge and odor to stop after she has completed the antibiotics.    No Known Allergies Past Medical History  Diagnosis Date  . UTI (lower urinary tract infection)   . BV (bacterial vaginosis)   . UTI (lower urinary tract infection)   . Hypertension   . Kidney infection    Current Outpatient Prescriptions on File Prior to Visit  Medication Sig Dispense Refill  . metroNIDAZOLE (FLAGYL) 500 MG tablet Take 1 tablet (500 mg total) by mouth 2 (two) times daily.  14 tablet  0   No current facility-administered medications on file prior to visit.   Family History  Problem Relation Age of Onset  . Hypertension Mother   . Heart disease Father   . Asthma Brother    History   Social History  . Marital Status: Single    Spouse Name: N/A    Number of Children: N/A  . Years of Education: N/A   Occupational History  . Not on file.   Social History Main Topics  . Smoking status: Current Some Day Smoker -- 0.25 packs/day  . Smokeless tobacco: Never Used  . Alcohol Use: No  . Drug Use: No  . Sexually Active: Yes    Birth Control/ Protection: Condom   Other Topics Concern  . Not on file   Social History Narrative  . No narrative on file    Review of Systems  Constitutional: Negative for fever, chills, diaphoresis, activity change, appetite change and fatigue.  HENT: Negative for ear pain, nosebleeds, congestion, facial swelling, rhinorrhea, neck pain, neck stiffness and ear discharge.   Eyes: Negative for pain, discharge, redness, itching and visual disturbance.  Respiratory:  Negative for cough, choking, chest tightness, shortness of breath, wheezing and stridor.   Cardiovascular: Negative for chest pain, palpitations and leg swelling.  Gastrointestinal: Negative for abdominal distention.  Genitourinary: Negative for dysuria, urgency, frequency, hematuria, flank pain, decreased urine volume, difficulty urinating and dyspareunia.  Musculoskeletal: Negative for back pain, joint swelling, arthralgias and gait problem.  Neurological: Negative for dizziness, tremors, seizures, syncope, facial asymmetry, speech difficulty, weakness, light-headedness, numbness and headaches.  Hematological: Negative for adenopathy. Does not bruise/bleed easily.  Psychiatric/Behavioral: Negative for hallucinations, behavioral problems, confusion, dysphoric mood, decreased concentration and agitation.    Objective:   Filed Vitals:   04/06/13 1114  BP: 152/97  Pulse: 71  Temp: 98.7 F (37.1 C)  Resp: 16    Physical Exam  Constitutional: Appears well-developed and well-nourished. No distress.  HENT: Normocephalic. External right and left ear normal. Oropharynx is clear and moist.  Eyes: Conjunctivae and EOM are normal. PERRLA, no scleral icterus.  Neck: Normal ROM. Neck supple. No JVD. No tracheal deviation. No thyromegaly.  CVS: RRR, S1/S2 +, no murmurs, no gallops, no carotid bruit.  Pulmonary: Effort and breath sounds normal, no stridor, rhonchi, wheezes, rales.  Abdominal: Soft. BS +,  no distension, tenderness, rebound or guarding.  Musculoskeletal: Normal range of motion. No edema and no tenderness.  Lymphadenopathy: No lymphadenopathy noted, cervical, inguinal. Neuro: Alert. Normal reflexes, muscle tone coordination. No cranial nerve deficit.  Skin: Skin is warm and dry. No rash noted. Not diaphoretic. No erythema. No pallor.  Psychiatric: Normal mood and affect. Behavior, judgment, thought content normal.   Lab Results  Component Value Date   WBC 8.1 06/08/2012   HGB 13.0  06/08/2012   HCT 39.0 06/08/2012   MCV 78.3 06/08/2012   PLT 277 06/08/2012   Lab Results  Component Value Date   CREATININE 0.94 06/08/2012   BUN 11 06/08/2012   NA 137 06/08/2012   K 3.8 06/08/2012   CL 100 06/08/2012   CO2 28 06/08/2012   No results found for this basename: HGBA1C   Lipid Panel  No results found for this basename: chol, trig, hdl, cholhdl, vldl, ldlcalc     Assessment and plan:   Patient Active Problem List   Diagnosis Date Noted  . Atypical squamous cell changes of undetermined significance (ASCUS) on cervical cytology with positive high risk human papilloma virus (HPV) 03/17/2013   Pt would like second opinion about her GYN issues   Referral made for GYN Today  Encouraged probiotics for patient to start using daily   Hypertension - trial of HCTZ 12.5 mg po daily Check labs today.  Follow up lab results.   The patient was counseled on the dangers of tobacco use, and was advised to quit.  Reviewed strategies to maximize success, including removing cigarettes and smoking materials from environment and stress management.  Follow up in 3 months for BP check   The patient was given clear instructions to go to ER or return to medical center if symptoms don't improve, worsen or new problems develop.  The patient verbalized understanding.  The patient was told to call to get any lab results if not heard anything in the next week.    Rodney Langton, MD, CDE, FAAFP [facility] [place], [place]

## 2013-04-07 ENCOUNTER — Telehealth: Payer: Self-pay | Admitting: *Deleted

## 2013-04-07 LAB — COMPLETE METABOLIC PANEL WITH GFR
ALT: 16 U/L (ref 0–35)
AST: 16 U/L (ref 0–37)
Albumin: 4.4 g/dL (ref 3.5–5.2)
Alkaline Phosphatase: 42 U/L (ref 39–117)
BUN: 12 mg/dL (ref 6–23)
CO2: 27 mEq/L (ref 19–32)
Calcium: 9.5 mg/dL (ref 8.4–10.5)
Chloride: 103 mEq/L (ref 96–112)
Creat: 0.92 mg/dL (ref 0.50–1.10)
GFR, Est African American: >89 mL/min
GFR, Est Non African American: 85 mL/min
Glucose, Bld: 66 mg/dL — ABNORMAL LOW (ref 70–99)
Potassium: 4.1 mEq/L (ref 3.5–5.3)
Sodium: 139 mEq/L (ref 135–145)
Total Bilirubin: 0.4 mg/dL (ref 0.3–1.2)
Total Protein: 6.9 g/dL (ref 6.0–8.3)

## 2013-04-07 LAB — TSH: TSH: 0.352 u[IU]/mL (ref 0.350–4.500)

## 2013-04-07 NOTE — Progress Notes (Signed)
Quick Note:  Please inform patient that labs came back OK.   C. L. Kortez Murtagh, MD, CDE, FAAFP [facility] [place], [place]   ______ 

## 2013-04-07 NOTE — Telephone Encounter (Signed)
04/07/13 Patient made aware That labs are okay per Dr. Laural Benes. P.Cahterine Heinzel,RN BSN [facility]

## 2013-04-09 LAB — URINE CULTURE: Colony Count: >=100000

## 2013-04-09 NOTE — Progress Notes (Signed)
Quick Note:  Please inform patient that her urine culture came back positive for E. Coli infection. Please finish the antibiotics and return to lab to have urine re-tested for cure in 2 weeks.   Rodney Langton, MD, CDE, FAAFP [facility] [place], [place]   ______

## 2013-04-11 ENCOUNTER — Telehealth: Payer: Self-pay | Admitting: *Deleted

## 2013-04-11 NOTE — Telephone Encounter (Signed)
04/11/13 Spoke with patient regarding urine culture came back positive for E-Coli infection. Patient instructed to finish antibioatics And return to lab to have urine re-tested in 2 weeks. P.Jash Wahlen,RN BSN [facility]

## 2013-04-18 ENCOUNTER — Ambulatory Visit (HOSPITAL_COMMUNITY): Admission: RE | Admit: 2013-04-18 | Payer: Self-pay | Source: Ambulatory Visit

## 2013-04-20 ENCOUNTER — Ambulatory Visit (HOSPITAL_COMMUNITY)
Admission: RE | Admit: 2013-04-20 | Discharge: 2013-04-20 | Disposition: A | Discharge status: Home / Self Care | Payer: Self-pay | Source: Ambulatory Visit | Attending: Obstetrics and Gynecology | Admitting: Obstetrics and Gynecology

## 2013-04-20 DIAGNOSIS — N83209 Unspecified ovarian cyst, unspecified side: Secondary | ICD-10-CM | POA: Insufficient documentation

## 2013-04-20 DIAGNOSIS — R87610 Atypical squamous cells of undetermined significance on cytologic smear of cervix (ASC-US): Secondary | ICD-10-CM

## 2013-04-25 ENCOUNTER — Telehealth: Payer: Self-pay | Admitting: *Deleted

## 2013-04-25 NOTE — Telephone Encounter (Signed)
Message copied by Gerome Apley on Mon Apr 25, 2013  4:25 PM ------      Message from: CONSTANT, PEGGY      Created: Mon Apr 25, 2013  3:08 PM       Please inform patient of complete resolution of her ovarian cyst. Normal ultrasound on 04/20/2013            Thanks            Peggy ------

## 2013-04-25 NOTE — Telephone Encounter (Signed)
Called Lisa Vazquez and gave her results of ultrasound. Patient voices understanding.

## 2013-05-02 ENCOUNTER — Encounter: Payer: Self-pay | Admitting: Advanced Practice Midwife

## 2013-05-24 ENCOUNTER — Encounter: Payer: Self-pay | Admitting: *Deleted

## 2013-06-15 ENCOUNTER — Encounter: Payer: Self-pay | Admitting: Advanced Practice Midwife

## 2013-08-09 ENCOUNTER — Encounter (HOSPITAL_COMMUNITY): Payer: Self-pay | Admitting: Emergency Medicine

## 2013-08-09 ENCOUNTER — Emergency Department (HOSPITAL_COMMUNITY)
Admission: EM | Admit: 2013-08-09 | Discharge: 2013-08-09 | Disposition: A | Discharge status: Home / Self Care | Payer: Self-pay | Attending: Emergency Medicine | Admitting: Emergency Medicine

## 2013-08-09 DIAGNOSIS — Z87440 Personal history of urinary (tract) infections: Secondary | ICD-10-CM | POA: Insufficient documentation

## 2013-08-09 DIAGNOSIS — I10 Essential (primary) hypertension: Secondary | ICD-10-CM | POA: Insufficient documentation

## 2013-08-09 DIAGNOSIS — B9789 Other viral agents as the cause of diseases classified elsewhere: Secondary | ICD-10-CM

## 2013-08-09 DIAGNOSIS — Z8742 Personal history of other diseases of the female genital tract: Secondary | ICD-10-CM | POA: Insufficient documentation

## 2013-08-09 DIAGNOSIS — F17200 Nicotine dependence, unspecified, uncomplicated: Secondary | ICD-10-CM | POA: Insufficient documentation

## 2013-08-09 DIAGNOSIS — J069 Acute upper respiratory infection, unspecified: Secondary | ICD-10-CM | POA: Insufficient documentation

## 2013-08-09 DIAGNOSIS — R5381 Other malaise: Secondary | ICD-10-CM | POA: Insufficient documentation

## 2013-08-09 MED ORDER — IBUPROFEN 800 MG PO TABS
800.0000 mg | ORAL_TABLET | Freq: Once | ORAL | Status: AC
  Administered 2013-08-09: 800 mg via ORAL
  Filled 2013-08-09: qty 1

## 2013-08-09 MED ORDER — HYDROCODONE-ACETAMINOPHEN 5-325 MG PO TABS: 1.0000 | ORAL_TABLET | ORAL | Status: DC | PRN

## 2013-08-09 NOTE — ED Notes (Signed)
Pt states that 2 days ago she started experiencing fever (no greater then 102), body aches, chills and weakness which stopped yesterday. Pt also has been experiencing dry cough and sore throat.

## 2013-08-10 NOTE — ED Provider Notes (Signed)
CSN: [id number]     Arrival date & time 08/09/13  2135 History   First MD Initiated Contact with Patient 08/09/13 2154     Chief Complaint  Patient presents with  . Cough  . Chest Pain   (Consider location/radiation/quality/duration/timing/severity/associated sxs/prior Treatment) HPI History provided by pt.   Pt presents w/ non-productive cough x 2 days.  Associated w/ fever, max temp 102, chills, generalized weakness, fatigue, frontal sinus pressure, nasal congestion, sore throat and substernal CP, particularly with coughing.  Denies SOB, N/V/D and urinary sx.  Has been taking tylenol cold w/ minimal relief.  No known sick contacts.   No PMH. Past Medical History  Diagnosis Date  . UTI (lower urinary tract infection)   . BV (bacterial vaginosis)   . UTI (lower urinary tract infection)   . Hypertension   . Kidney infection    Past Surgical History  Procedure Laterality Date  . Cesarean section      x3   Family History  Problem Relation Age of Onset  . Hypertension Mother   . Heart disease Father   . Asthma Brother    History  Substance Use Topics  . Smoking status: Current Every Day Smoker -- 0.25 packs/day  . Smokeless tobacco: Never Used  . Alcohol Use: No   OB History   Grav Para Term Preterm Abortions TAB SAB Ect Mult Living   4 3 1 2 1 1   0 0 3     Review of Systems  All other systems reviewed and are negative.    Allergies  Review of patient's allergies indicates no known allergies.  Home Medications   Current Outpatient Rx  Name  Route  Sig  Dispense  Refill  . chlorpheniramine-pseudoephedrine-acetaminophen (SINUTAB) 2-30-500 MG per tablet   Oral   Take 1 tablet by mouth every 4 (four) hours as needed for allergies.         Marland Kitchen HYDROcodone-acetaminophen (NORCO/VICODIN) 5-325 MG per tablet   Oral   Take 1 tablet by mouth every 4 (four) hours as needed for moderate pain.   12 tablet   0    BP 123/86  Pulse 83  Temp(Src) 98.4 F (36.9 C) (Oral)   Resp 18  SpO2 100%  LMP 08/08/2013 Physical Exam  Nursing note and vitals reviewed. Constitutional: She is oriented to person, place, and time. She appears well-developed and well-nourished. No distress.  HENT:  Head: Normocephalic and atraumatic.  Mouth/Throat: Uvula is midline and mucous membranes are normal. No trismus in the jaw. No posterior oropharyngeal edema or posterior oropharyngeal erythema.  Nml TM and EAC bilaterally.  Oropharynx clear.  No erythema of posterior pharynx. Tonsils symmetric and w/out edema/exudate.  Uvula mid-line.  No trismus.  Nasal congestion.  Eyes:  Normal appearance  Neck: Normal range of motion. Neck supple.  Cardiovascular: Normal rate and regular rhythm.   Pulmonary/Chest: Effort normal and breath sounds normal.  Tenderness bilateral sternal border as well as over sternum  Musculoskeletal: Normal range of motion.  Lymphadenopathy:    She has no cervical adenopathy.  Neurological: She is alert and oriented to person, place, and time.  Skin: Skin is warm and dry. No rash noted.  Psychiatric: She has a normal mood and affect. Her behavior is normal.    ED Course  Procedures (including critical care time) Labs Review Labs Reviewed - No data to display Imaging Review No results found.  EKG Interpretation   None       MDM  1. Viral respiratory illness    Healthy 27yo F presents w/ f/c, generalized weakness, nasal congestion, sore throat and cough x 2 days.  Has pain in center of chest with coughing.  Afebrile, non-toxic appearing, well-hydrated, no respiratory distress on exam.  Suspect viral respiratory illness, possibly influenza.  Recommended ibuprofen, sudafed, fluids and rest.  Prescribed 12 vicodin for pain in chest, likely musculoskeletal from coughing, and cough suppression.  Return precautions discussed.     Otilio Miu, PA-C 08/10/13 [phone]

## 2013-08-10 NOTE — ED Provider Notes (Signed)
Medical screening examination/treatment/procedure(s) were performed by non-physician practitioner and as supervising physician I was immediately available for consultation/collaboration.   Shaddai Shapley, MD 08/10/13 0838 

## 2013-11-06 ENCOUNTER — Emergency Department (HOSPITAL_COMMUNITY): Payer: Self-pay

## 2013-11-06 ENCOUNTER — Encounter (HOSPITAL_COMMUNITY): Payer: Self-pay | Admitting: Emergency Medicine

## 2013-11-06 ENCOUNTER — Emergency Department (HOSPITAL_COMMUNITY)
Admission: EM | Admit: 2013-11-06 | Discharge: 2013-11-06 | Disposition: A | Discharge status: Home / Self Care | Payer: Self-pay | Attending: Emergency Medicine | Admitting: Emergency Medicine

## 2013-11-06 DIAGNOSIS — J069 Acute upper respiratory infection, unspecified: Secondary | ICD-10-CM | POA: Insufficient documentation

## 2013-11-06 DIAGNOSIS — F17200 Nicotine dependence, unspecified, uncomplicated: Secondary | ICD-10-CM | POA: Insufficient documentation

## 2013-11-06 DIAGNOSIS — R5383 Other fatigue: Secondary | ICD-10-CM

## 2013-11-06 DIAGNOSIS — I10 Essential (primary) hypertension: Secondary | ICD-10-CM | POA: Insufficient documentation

## 2013-11-06 DIAGNOSIS — R630 Anorexia: Secondary | ICD-10-CM | POA: Insufficient documentation

## 2013-11-06 DIAGNOSIS — Z8619 Personal history of other infectious and parasitic diseases: Secondary | ICD-10-CM | POA: Insufficient documentation

## 2013-11-06 DIAGNOSIS — R5381 Other malaise: Secondary | ICD-10-CM | POA: Insufficient documentation

## 2013-11-06 DIAGNOSIS — Z87440 Personal history of urinary (tract) infections: Secondary | ICD-10-CM | POA: Insufficient documentation

## 2013-11-06 DIAGNOSIS — Z8742 Personal history of other diseases of the female genital tract: Secondary | ICD-10-CM | POA: Insufficient documentation

## 2013-11-06 DIAGNOSIS — M549 Dorsalgia, unspecified: Secondary | ICD-10-CM | POA: Insufficient documentation

## 2013-11-06 LAB — POCT I-STAT TROPONIN I: Troponin i, poc: 0.01 ng/mL (ref 0.00–0.08)

## 2013-11-06 MED ORDER — SALINE SPRAY 0.65 % NA SOLN
1.0000 | Freq: Once | NASAL | Status: AC
  Administered 2013-11-06: 1 via NASAL
  Filled 2013-11-06: qty 44

## 2013-11-06 MED ORDER — IBUPROFEN 600 MG PO TABS: 600.0000 mg | ORAL_TABLET | Freq: Four times a day (QID) | ORAL | Status: DC | PRN

## 2013-11-06 MED ORDER — KETOROLAC TROMETHAMINE 30 MG/ML IJ SOLN
30.0000 mg | Freq: Once | INTRAMUSCULAR | Status: AC
  Administered 2013-11-06: 30 mg via INTRAVENOUS
  Filled 2013-11-06: qty 1

## 2013-11-06 NOTE — Discharge Instructions (Signed)

## 2013-11-06 NOTE — ED Provider Notes (Signed)
CSN: [id number]     Arrival date & time 11/06/13  1413 History   First MD Initiated Contact with Patient 11/06/13 1421     Chief Complaint  Patient presents with  . Chest Pain     (Consider location/radiation/quality/duration/timing/severity/associated sxs/prior Treatment) HPI  This a 28 year old female with history of hypertension who presents with congestion, chest pain, back pain, cough. Patient reports she's had symptoms for one week. She is taking Mucinex without any improvement. She states yesterday she developed anterior chest pain and back pain. She states that it feels like "someone dropped take me." She does endorse shortness of breath. She denies any leg swelling, history of blood clots, recent surgeries, birth control use. She reports decreased by mouth intake and decreased energy. The pain is worse with movement. Reports cough productive of green sputum. Denies any fevers or sick contacts.  Past Medical History  Diagnosis Date  . UTI (lower urinary tract infection)   . BV (bacterial vaginosis)   . UTI (lower urinary tract infection)   . Hypertension   . Kidney infection    Past Surgical History  Procedure Laterality Date  . Cesarean section      x3   Family History  Problem Relation Age of Onset  . Hypertension Mother   . Heart disease Father   . Asthma Brother    History  Substance Use Topics  . Smoking status: Current Every Day Smoker -- 0.25 packs/day  . Smokeless tobacco: Never Used  . Alcohol Use: No   OB History   Grav Para Term Preterm Abortions TAB SAB Ect Mult Living   4 3 1 2 1 1   0 0 3     Review of Systems  Constitutional: Negative for fever.  Respiratory: Positive for cough, chest tightness and shortness of breath.   Cardiovascular: Positive for chest pain. Negative for leg swelling.  Gastrointestinal: Negative for nausea, vomiting and abdominal pain.  Genitourinary: Negative for dysuria.  Musculoskeletal: Positive for back pain.  Skin:  Negative for rash.  All other systems reviewed and are negative.      Allergies  Review of patient's allergies indicates no known allergies.  Home Medications   Current Outpatient Rx  Name  Route  Sig  Dispense  Refill  . GuaiFENesin (MUCINEX PO)   Oral   Take 1 capsule by mouth once.         Marland Kitchen ibuprofen (ADVIL,MOTRIN) 600 MG tablet   Oral   Take 1 tablet (600 mg total) by mouth every 6 (six) hours as needed.   30 tablet   0    BP 130/74  Pulse 90  Temp(Src) 97.9 F (36.6 C) (Oral)  Resp 23  Ht 5\' 4"  (1.626 m)  Wt 150 lb 6.4 oz (68.221 kg)  BMI 25.80 kg/m2  SpO2 99%  LMP 10/06/2013 Physical Exam  Nursing note and vitals reviewed. Constitutional: She is oriented to person, place, and time. She appears well-developed and well-nourished. No distress.  HENT:  Head: Normocephalic and atraumatic.  Mouth/Throat: Oropharynx is clear and moist.  Eyes: Pupils are equal, round, and reactive to light.  Neck: Neck supple.  Cardiovascular: Normal rate, regular rhythm and normal heart sounds.   No murmur heard. Pulmonary/Chest: Effort normal and breath sounds normal. No respiratory distress. She has no wheezes. She exhibits tenderness.  Tenderness palpation of the anterior chest wall  Abdominal: Soft. There is no tenderness.  Musculoskeletal: She exhibits no edema.  Tenderness palpation over the back along the  paraspinous muscles, no midline tenderness, step-off or deformity  Neurological: She is alert and oriented to person, place, and time.  Skin: Skin is warm and dry. No rash noted.  Psychiatric: She has a normal mood and affect.    ED Course  Procedures (including critical care time) Labs Review Labs Reviewed  POCT I-STAT TROPONIN I   Imaging Review Dg Chest 2 View  11/06/2013   CLINICAL DATA:  Chest pain and shortness of breath.  EXAM: CHEST  2 VIEW  COMPARISON:  No priors.  FINDINGS: Lung volumes are normal. No consolidative airspace disease. No pleural  effusions. No pneumothorax. No pulmonary nodule or mass noted. Pulmonary vasculature and the cardiomediastinal silhouette are within normal limits.  IMPRESSION: 1.  No radiographic evidence of acute cardiopulmonary disease.   Electronically Signed   By: Trudie Reedaniel  Entrikin M.D.   On: 11/06/2013 15:37    EKG Interpretation    Date/Time:  Sunday November 06 2013 14:20:02 EST Ventricular Rate:  87 PR Interval:  152 QRS Duration: 90 QT Interval:  372 QTC Calculation: 447 R Axis:   -78 Text Interpretation:  Normal sinus rhythm Left axis deviation Abnormal ECG No significant change since last tracing Confirmed by Tarvaris Puglia  MD, Toni AmendOURTNEY ([id number]) on 11/06/2013 2:24:41 PM            MDM   Final diagnoses:  Upper respiratory infection   Patient presents with cough, congestion, chest pain, shortness of breath. She is nontoxic-appearing on exam. She is 100% on room air. Patient has tenderness to palpation of the anterior chest and back suggesting musculoskeletal etiology of chest and back pain. Chest x-ray is negative for pneumonia. EKG is reassuring troponin is negative. She is PERC negative.  Patient given Toradol for pain. Encouraged to use ibuprofen and nasal saline for symptomatic relief at home.  After history, exam, and medical workup I feel the patient has been appropriately medically screened and is safe for discharge home. Pertinent diagnoses were discussed with the patient. Patient was given return precautions.    Shon Batonourtney F Joeph Szatkowski, MD 11/06/13 [phone]

## 2013-11-06 NOTE — ED Notes (Signed)
C/o chest and back pain and feeling SOB since this am. A&ox4, resp e/u. States shes been congested since last week

## 2013-12-16 ENCOUNTER — Encounter (HOSPITAL_COMMUNITY): Payer: Self-pay | Admitting: Emergency Medicine

## 2013-12-16 ENCOUNTER — Emergency Department (INDEPENDENT_AMBULATORY_CARE_PROVIDER_SITE_OTHER)
Admission: EM | Admit: 2013-12-16 | Discharge: 2013-12-16 | Disposition: A | Discharge status: Home / Self Care | Payer: Self-pay | Source: Home / Self Care | Attending: Family Medicine | Admitting: Family Medicine

## 2013-12-16 DIAGNOSIS — J069 Acute upper respiratory infection, unspecified: Secondary | ICD-10-CM

## 2013-12-16 LAB — POCT RAPID STREP A: STREPTOCOCCUS, GROUP A SCREEN (DIRECT): NEGATIVE

## 2013-12-16 MED ORDER — PENICILLIN V POTASSIUM 500 MG PO TABS: 500.0000 mg | ORAL_TABLET | Freq: Three times a day (TID) | ORAL | Status: DC

## 2013-12-16 NOTE — Discharge Instructions (Signed)
Because other members of your family have strep throat, you are being treated as well.

## 2013-12-16 NOTE — ED Notes (Addendum)
States she is ST, ear pain, cough, general body aches minimal relief w OTC medications. 3 minor children in home ill

## 2013-12-16 NOTE — ED Provider Notes (Signed)
CSN: [id number]     Arrival date & time 12/16/13  1008 History   First MD Initiated Contact with Patient 12/16/13 1031     Chief Complaint  Patient presents with  . URI   (Consider location/radiation/quality/duration/timing/severity/associated sxs/prior Treatment) HPI Comments: All three of her children ill with same.  Patient is a smoker.  Patient is a 28 y.o. female presenting with URI. The history is provided by the patient.  URI Presenting symptoms: congestion, cough, rhinorrhea and sore throat   Presenting symptoms: no ear pain and no fever   Severity:  Moderate Onset quality:  Gradual Duration:  5 days Chronicity:  New Associated symptoms: no arthralgias, no headaches, no myalgias, no neck pain, no sinus pain, no sneezing, no swollen glands and no wheezing   Risk factors: sick contacts     Past Medical History  Diagnosis Date  . UTI (lower urinary tract infection)   . BV (bacterial vaginosis)   . UTI (lower urinary tract infection)   . Hypertension   . Kidney infection    Past Surgical History  Procedure Laterality Date  . Cesarean section      x3   Family History  Problem Relation Age of Onset  . Hypertension Mother   . Heart disease Father   . Asthma Brother    History  Substance Use Topics  . Smoking status: Current Every Day Smoker -- 0.25 packs/day  . Smokeless tobacco: Never Used  . Alcohol Use: No   OB History   Grav Para Term Preterm Abortions TAB SAB Ect Mult Living   4 3 1 2 1 1   0 0 3     Review of Systems  Constitutional: Negative for fever.  HENT: Positive for congestion, rhinorrhea and sore throat. Negative for ear pain and sneezing.   Respiratory: Positive for cough. Negative for wheezing.   Musculoskeletal: Negative for arthralgias, myalgias and neck pain.  Neurological: Negative for headaches.  All other systems reviewed and are negative.    Allergies  Review of patient's allergies indicates no known allergies.  Home Medications    Current Outpatient Rx  Name  Route  Sig  Dispense  Refill  . GuaiFENesin (MUCINEX PO)   Oral   Take 1 capsule by mouth once.         Marland Kitchen ibuprofen (ADVIL,MOTRIN) 600 MG tablet   Oral   Take 1 tablet (600 mg total) by mouth every 6 (six) hours as needed.   30 tablet   0   . penicillin v potassium (VEETID) 500 MG tablet   Oral   Take 1 tablet (500 mg total) by mouth 3 (three) times daily. X 10 days   30 tablet   0    BP 126/94  Pulse 88  Temp(Src) 98.5 F (36.9 C) (Oral)  Resp 16  SpO2 100% Physical Exam  Nursing note and vitals reviewed. Constitutional: She is oriented to person, place, and time. She appears well-developed and well-nourished. No distress.  HENT:  Head: Normocephalic and atraumatic.  Right Ear: Hearing, tympanic membrane, external ear and ear canal normal.  Left Ear: Hearing, tympanic membrane, external ear and ear canal normal.  Nose: Nose normal.  Mouth/Throat: Uvula is midline, oropharynx is clear and moist and mucous membranes are normal.  Eyes: Conjunctivae are normal. Right eye exhibits no discharge. Left eye exhibits no discharge. No scleral icterus.  Neck: Normal range of motion. Neck supple.  Cardiovascular: Normal rate, regular rhythm and normal heart sounds.   Pulmonary/Chest:  Effort normal and breath sounds normal. No respiratory distress. She has no wheezes.  Abdominal: Soft. Bowel sounds are normal. She exhibits no distension. There is no tenderness.  Musculoskeletal: Normal range of motion.  Lymphadenopathy:    She has no cervical adenopathy.  Neurological: She is alert and oriented to person, place, and time.  Skin: Skin is warm and dry.  Psychiatric: She has a normal mood and affect. Her behavior is normal.    ED Course  Procedures (including critical care time) Labs Review Labs Reviewed  CULTURE, GROUP A STREP  POCT RAPID STREP A ([facility] CARE ONLY)   Imaging Review No results found.   MDM   1. URI (upper respiratory  infection)   Symptomatic care of URI sx at home. [facility] Will place on prophylactic PCN VK x 10 days for home. Advised to discontinue smoking. Follow up prn.    Ardis RowanJennifer Lee Annais Crafts, PA 12/16/13 1253

## 2013-12-18 LAB — CULTURE, GROUP A STREP

## 2013-12-18 NOTE — ED Provider Notes (Signed)
Medical screening examination/treatment/procedure(s) were performed by a resident physician or non-physician practitioner and as the supervising physician I was immediately available for consultation/collaboration.  Evan Corey, MD    Evan S Corey, MD 12/18/13 0846 

## 2014-03-10 ENCOUNTER — Emergency Department (HOSPITAL_COMMUNITY)
Admission: EM | Admit: 2014-03-10 | Discharge: 2014-03-11 | Disposition: A | Discharge status: Home / Self Care | Payer: Self-pay | Attending: Emergency Medicine | Admitting: Emergency Medicine

## 2014-03-10 ENCOUNTER — Encounter (HOSPITAL_COMMUNITY): Payer: Self-pay | Admitting: Emergency Medicine

## 2014-03-10 DIAGNOSIS — Z8619 Personal history of other infectious and parasitic diseases: Secondary | ICD-10-CM | POA: Insufficient documentation

## 2014-03-10 DIAGNOSIS — I10 Essential (primary) hypertension: Secondary | ICD-10-CM | POA: Insufficient documentation

## 2014-03-10 DIAGNOSIS — F17200 Nicotine dependence, unspecified, uncomplicated: Secondary | ICD-10-CM | POA: Insufficient documentation

## 2014-03-10 DIAGNOSIS — N390 Urinary tract infection, site not specified: Secondary | ICD-10-CM | POA: Insufficient documentation

## 2014-03-10 DIAGNOSIS — Z3202 Encounter for pregnancy test, result negative: Secondary | ICD-10-CM | POA: Insufficient documentation

## 2014-03-10 LAB — I-STAT CHEM 8, ED
BUN: 12 mg/dL (ref 6–23)
CALCIUM ION: 1.16 mmol/L (ref 1.12–1.23)
CHLORIDE: 99 meq/L (ref 96–112)
CREATININE: 1 mg/dL (ref 0.50–1.10)
GLUCOSE: 91 mg/dL (ref 70–99)
HEMATOCRIT: 45 % (ref 36.0–46.0)
Hemoglobin: 15.3 g/dL — ABNORMAL HIGH (ref 12.0–15.0)
Potassium: 3.8 mEq/L (ref 3.7–5.3)
Sodium: 138 mEq/L (ref 137–147)
TCO2: 25 mmol/L (ref 0–100)

## 2014-03-10 LAB — CBC WITH DIFFERENTIAL/PLATELET
BASOS PCT: 0 % (ref 0–1)
Basophils Absolute: 0 10*3/uL (ref 0.0–0.1)
EOS ABS: 0.1 10*3/uL (ref 0.0–0.7)
Eosinophils Relative: 1 % (ref 0–5)
HEMATOCRIT: 38.3 % (ref 36.0–46.0)
Hemoglobin: 12.8 g/dL (ref 12.0–15.0)
LYMPHS ABS: 1.5 10*3/uL (ref 0.7–4.0)
Lymphocytes Relative: 18 % (ref 12–46)
MCH: 26.3 pg (ref 26.0–34.0)
MCHC: 33.4 g/dL (ref 30.0–36.0)
MCV: 78.6 fL (ref 78.0–100.0)
MONO ABS: 1.2 10*3/uL — AB (ref 0.1–1.0)
Monocytes Relative: 14 % — ABNORMAL HIGH (ref 3–12)
NEUTROS ABS: 5.6 10*3/uL (ref 1.7–7.7)
NEUTROS PCT: 67 % (ref 43–77)
Platelets: 220 10*3/uL (ref 150–400)
RBC: 4.87 MIL/uL (ref 3.87–5.11)
RDW: 14.9 % (ref 11.5–15.5)
WBC: 8.4 10*3/uL (ref 4.0–10.5)

## 2014-03-10 LAB — URINALYSIS, ROUTINE W REFLEX MICROSCOPIC
BILIRUBIN URINE: NEGATIVE
Glucose, UA: NEGATIVE mg/dL
KETONES UR: NEGATIVE mg/dL
Nitrite: NEGATIVE
Protein, ur: NEGATIVE mg/dL
Specific Gravity, Urine: 1.021 (ref 1.005–1.030)
Urobilinogen, UA: 1 mg/dL (ref 0.0–1.0)
pH: 6 (ref 5.0–8.0)

## 2014-03-10 LAB — POC URINE PREG, ED: Preg Test, Ur: NEGATIVE

## 2014-03-10 LAB — URINE MICROSCOPIC-ADD ON

## 2014-03-10 MED ORDER — CEPHALEXIN 500 MG PO CAPS: 500.0000 mg | ORAL_CAPSULE | Freq: Four times a day (QID) | ORAL | Status: DC

## 2014-03-10 MED ORDER — HYDROMORPHONE HCL PF 1 MG/ML IJ SOLN
1.0000 mg | INTRAMUSCULAR | Status: AC
  Administered 2014-03-10: 1 mg via INTRAVENOUS
  Filled 2014-03-10: qty 1

## 2014-03-10 MED ORDER — ONDANSETRON HCL 4 MG/2ML IJ SOLN
4.0000 mg | Freq: Once | INTRAMUSCULAR | Status: AC
  Administered 2014-03-10: 4 mg via INTRAVENOUS
  Filled 2014-03-10: qty 2

## 2014-03-10 MED ORDER — SODIUM CHLORIDE 0.9 % IV BOLUS (SEPSIS)
1000.0000 mL | Freq: Once | INTRAVENOUS | Status: AC
  Administered 2014-03-10: 1000 mL via INTRAVENOUS

## 2014-03-10 MED ORDER — HYDROCODONE-ACETAMINOPHEN 5-325 MG PO TABS: 1.0000 | ORAL_TABLET | ORAL | Status: DC | PRN

## 2014-03-10 MED ORDER — ONDANSETRON HCL 4 MG PO TABS: 4.0000 mg | ORAL_TABLET | Freq: Three times a day (TID) | ORAL | Status: DC | PRN

## 2014-03-10 MED ORDER — DEXTROSE 5 % IV SOLN
1.0000 g | Freq: Once | INTRAVENOUS | Status: AC
  Administered 2014-03-10: 1 g via INTRAVENOUS
  Filled 2014-03-10: qty 10

## 2014-03-10 NOTE — ED Notes (Signed)
Patient with urinary retention and frequency.  She states that she has some lower back pain radiating down legs.  Patient states she did have chills earlier.

## 2014-03-10 NOTE — ED Provider Notes (Signed)
CSN: [id number]     Arrival date & time 03/10/14  1944 History   First MD Initiated Contact with Patient 03/10/14 2025     Chief Complaint  Patient presents with  . Fever  . Dysuria     (Consider location/radiation/quality/duration/timing/severity/associated sxs/prior Treatment) HPI  Patient with hx UTI and "kidney infection" presents with sudden onset bilateral midback pain and generalized abdominal pain that began around 4pm today.  Pain is constant, gradually worsening, described as "feels like someone stabbing me in my kidneys," 9/10 intensity.  Associated urinary frequency, strainging to get urine out.  Generalized chills, shaking, generalized weakness.  N/V x 1, emesis is yellow.  Denies vaginal discharge or bleeding or change in bowel habits.  LMP 03/01/14.   States this feels exactly like prior kidney infections.  No hx kidney stones.    Past Medical History  Diagnosis Date  . UTI (lower urinary tract infection)   . BV (bacterial vaginosis)   . UTI (lower urinary tract infection)   . Hypertension   . Kidney infection    Past Surgical History  Procedure Laterality Date  . Cesarean section      x3   Family History  Problem Relation Age of Onset  . Hypertension Mother   . Heart disease Father   . Asthma Brother    History  Substance Use Topics  . Smoking status: Current Every Day Smoker -- 0.25 packs/day  . Smokeless tobacco: Never Used  . Alcohol Use: No   OB History   Grav Para Term Preterm Abortions TAB SAB Ect Mult Living   4 3 1 2 1 1   0 0 3     Review of Systems  All other systems reviewed and are negative.     Allergies  Review of patient's allergies indicates no known allergies.  Home Medications   Prior to Admission medications   Medication Sig Start Date End Date Taking? Authorizing Provider  ibuprofen (ADVIL,MOTRIN) 600 MG tablet Take 1 tablet (600 mg total) by mouth every 6 (six) hours as needed. 11/06/13  Yes Shon Batonourtney F Horton, MD   BP  131/88  Pulse 105  Temp(Src) 99.2 F (37.3 C) (Oral)  Resp 16  Ht 5\' 4"  (1.626 m)  Wt 154 lb (69.854 kg)  BMI 26.42 kg/m2  SpO2 98%  LMP 03/01/2014 Physical Exam  Nursing note and vitals reviewed. Constitutional: She appears well-developed and well-nourished. No distress.  HENT:  Head: Normocephalic and atraumatic.  Neck: Neck supple.  Cardiovascular: Normal rate and regular rhythm.   Pulmonary/Chest: Effort normal and breath sounds normal. No respiratory distress. She has no wheezes. She has no rales.  Abdominal: Soft. She exhibits no distension. There is generalized tenderness. There is CVA tenderness (bilateral). There is no rebound and no guarding.  Neurological: She is alert.  Skin: She is not diaphoretic.    ED Course  Procedures (including critical care time) Labs Review Labs Reviewed  CBC WITH DIFFERENTIAL - Abnormal; Notable for the following:    Monocytes Relative 14 (*)    Monocytes Absolute 1.2 (*)    All other components within normal limits  URINALYSIS, ROUTINE W REFLEX MICROSCOPIC - Abnormal; Notable for the following:    APPearance CLOUDY (*)    Hgb urine dipstick MODERATE (*)    Leukocytes, UA MODERATE (*)    All other components within normal limits  URINE MICROSCOPIC-ADD ON - Abnormal; Notable for the following:    Squamous Epithelial / LPF FEW (*)  Bacteria, UA MANY (*)    All other components within normal limits  I-STAT CHEM 8, ED - Abnormal; Notable for the following:    Hemoglobin 15.3 (*)    All other components within normal limits  URINE CULTURE  POC URINE PREG, ED    Imaging Review No results found.   EKG Interpretation None      10:58 PM Patient feeling much better.  Ready for d/c home.  Will call ride.   MDM   Final diagnoses:  UTI (lower urinary tract infection)    Pt with hx UTI/"kidney infection" p/w bilateral flank, generalized abdominal pain, urinary frequency and straining to urinate, N/V.   Pt reports frequent  urinary tract infection, no hx kidney stones.  Pt feeling better after pain, nausea medication, IV rocephin.  D/C home with urology follow up.  Norco, zofran, keflex.  Urine culture pending.  Discussed results, findings, treatment, and follow up  with patient.  Pt given return precautions.  Pt verbalizes understanding and agrees with plan.        Trixie Dredgemily West, PA-C 03/10/14 2333

## 2014-03-10 NOTE — ED Notes (Signed)
The pt has had lower abd pain since 1600 with n.  She feels like she has a uti  Because she has had previously.  Alert no distress

## 2014-03-10 NOTE — Discharge Instructions (Signed)
Read the information below.  Use the prescribed medication as directed.  Please discuss all new medications with your pharmacist.  Do not take additional tylenol while taking the prescribed pain medication to avoid overdose.  You may return to the Emergency Department at any time for worsening condition or any new symptoms that concern you.  If you develop high fevers, worsening abdominal pain, uncontrolled vomiting, or are unable to tolerate fluids by mouth, return to the ER for a recheck.     Urinary Tract Infection Urinary tract infections (UTIs) can develop anywhere along your urinary tract. Your urinary tract is your body's drainage system for removing wastes and extra water. Your urinary tract includes two kidneys, two ureters, a bladder, and a urethra. Your kidneys are a pair of bean-shaped organs. Each kidney is about the size of your fist. They are located below your ribs, one on each side of your spine. CAUSES Infections are caused by microbes, which are microscopic organisms, including fungi, viruses, and bacteria. These organisms are so small that they can only be seen through a microscope. Bacteria are the microbes that most commonly cause UTIs. SYMPTOMS  Symptoms of UTIs may vary by age and gender of the patient and by the location of the infection. Symptoms in young women typically include a frequent and intense urge to urinate and a painful, burning feeling in the bladder or urethra during urination. Older women and men are more likely to be tired, shaky, and weak and have muscle aches and abdominal pain. A fever may mean the infection is in your kidneys. Other symptoms of a kidney infection include pain in your back or sides below the ribs, nausea, and vomiting. DIAGNOSIS To diagnose a UTI, your caregiver will ask you about your symptoms. Your caregiver also will ask to provide a urine sample. The urine sample will be tested for bacteria and white blood cells. White blood cells are made by  your body to help fight infection. TREATMENT  Typically, UTIs can be treated with medication. Because most UTIs are caused by a bacterial infection, they usually can be treated with the use of antibiotics. The choice of antibiotic and length of treatment depend on your symptoms and the type of bacteria causing your infection. HOME CARE INSTRUCTIONS  If you were prescribed antibiotics, take them exactly as your caregiver instructs you. Finish the medication even if you feel better after you have only taken some of the medication.  Drink enough water and fluids to keep your urine clear or pale yellow.  Avoid caffeine, tea, and carbonated beverages. They tend to irritate your bladder.  Empty your bladder often. Avoid holding urine for long periods of time.  Empty your bladder before and after sexual intercourse.  After a bowel movement, women should cleanse from front to back. Use each tissue only once. SEEK MEDICAL CARE IF:   You have back pain.  You develop a fever.  Your symptoms do not begin to resolve within 3 days. SEEK IMMEDIATE MEDICAL CARE IF:   You have severe back pain or lower abdominal pain.  You develop chills.  You have nausea or vomiting.  You have continued burning or discomfort with urination. MAKE SURE YOU:   Understand these instructions.  Will watch your condition.  Will get help right away if you are not doing well or get worse. Document Released: 06/18/2005 Document Revised: 03/09/2012 Document Reviewed: 10/17/2011 [facility] Patient Information 2015 [employer], [place]. This information is not intended to replace advice given to  you by your health care provider. Make sure you discuss any questions you have with your health care provider.

## 2014-03-12 NOTE — ED Provider Notes (Signed)
Medical screening examination/treatment/procedure(s) were performed by non-physician practitioner and as supervising physician I was immediately available for consultation/collaboration.   EKG Interpretation None        Kathleen M McManus, DO 03/12/14 1510 

## 2014-03-13 LAB — URINE CULTURE

## 2014-03-14 ENCOUNTER — Telehealth (HOSPITAL_BASED_OUTPATIENT_CLINIC_OR_DEPARTMENT_OTHER): Payer: Self-pay | Admitting: Emergency Medicine

## 2014-03-14 NOTE — Telephone Encounter (Signed)
Post ED Visit - Positive Culture Follow-up  Culture report reviewed by antimicrobial stewardship pharmacist: [x]  Wes Dulaney, Pharm.D., BCPS []  Celedonio MiyamotoJeremy Frens, [address] BCPS []  Georgina PillionElizabeth Martin, Pharm.D., BCPS []  MontgomeryMinh Pham, [address]Ph[address]., BCPS, AAHIVP []  Estella HuskMichelle Turner, Pharm.D., BCPS, AAHIVP []  Harvie JuniorNathan Cope, Pharm.D.  Positive Urine culture Treated with Cephalexin, organism sensitive to the same and no further patient follow-up is required at this time.  Jiles HaroldGammons, Shannon Chaney 03/14/2014, 4:30 PM

## 2014-05-06 ENCOUNTER — Emergency Department (HOSPITAL_COMMUNITY): Payer: Self-pay

## 2014-05-06 ENCOUNTER — Encounter (HOSPITAL_COMMUNITY): Payer: Self-pay | Admitting: Emergency Medicine

## 2014-05-06 ENCOUNTER — Emergency Department (HOSPITAL_COMMUNITY)
Admission: EM | Admit: 2014-05-06 | Discharge: 2014-05-06 | Disposition: A | Discharge status: Home / Self Care | Payer: Self-pay | Attending: Emergency Medicine | Admitting: Emergency Medicine

## 2014-05-06 DIAGNOSIS — F17200 Nicotine dependence, unspecified, uncomplicated: Secondary | ICD-10-CM | POA: Insufficient documentation

## 2014-05-06 DIAGNOSIS — R300 Dysuria: Secondary | ICD-10-CM | POA: Insufficient documentation

## 2014-05-06 DIAGNOSIS — Z3202 Encounter for pregnancy test, result negative: Secondary | ICD-10-CM | POA: Insufficient documentation

## 2014-05-06 DIAGNOSIS — Z87440 Personal history of urinary (tract) infections: Secondary | ICD-10-CM | POA: Insufficient documentation

## 2014-05-06 DIAGNOSIS — R109 Unspecified abdominal pain: Secondary | ICD-10-CM | POA: Insufficient documentation

## 2014-05-06 DIAGNOSIS — I10 Essential (primary) hypertension: Secondary | ICD-10-CM | POA: Insufficient documentation

## 2014-05-06 DIAGNOSIS — Z8619 Personal history of other infectious and parasitic diseases: Secondary | ICD-10-CM | POA: Insufficient documentation

## 2014-05-06 LAB — URINALYSIS, ROUTINE W REFLEX MICROSCOPIC
Bilirubin Urine: NEGATIVE
GLUCOSE, UA: NEGATIVE mg/dL
HGB URINE DIPSTICK: NEGATIVE
Ketones, ur: NEGATIVE mg/dL
LEUKOCYTES UA: NEGATIVE
Nitrite: NEGATIVE
PH: 5.5 (ref 5.0–8.0)
PROTEIN: NEGATIVE mg/dL
Specific Gravity, Urine: 1.019 (ref 1.005–1.030)
Urobilinogen, UA: 0.2 mg/dL (ref 0.0–1.0)

## 2014-05-06 LAB — CBC
HEMATOCRIT: 38 % (ref 36.0–46.0)
Hemoglobin: 12.7 g/dL (ref 12.0–15.0)
MCH: 26.2 pg (ref 26.0–34.0)
MCHC: 33.4 g/dL (ref 30.0–36.0)
MCV: 78.5 fL (ref 78.0–100.0)
Platelets: 250 10*3/uL (ref 150–400)
RBC: 4.84 MIL/uL (ref 3.87–5.11)
RDW: 15.4 % (ref 11.5–15.5)
WBC: 7.9 10*3/uL (ref 4.0–10.5)

## 2014-05-06 LAB — I-STAT CHEM 8, ED
BUN: 11 mg/dL (ref 6–23)
CHLORIDE: 106 meq/L (ref 96–112)
Calcium, Ion: 1.21 mmol/L (ref 1.12–1.23)
Creatinine, Ser: 1.2 mg/dL — ABNORMAL HIGH (ref 0.50–1.10)
GLUCOSE: 73 mg/dL (ref 70–99)
HEMATOCRIT: 42 % (ref 36.0–46.0)
HEMOGLOBIN: 14.3 g/dL (ref 12.0–15.0)
Potassium: 3.5 mEq/L — ABNORMAL LOW (ref 3.7–5.3)
Sodium: 140 mEq/L (ref 137–147)
TCO2: 28 mmol/L (ref 0–100)

## 2014-05-06 LAB — POC URINE PREG, ED: PREG TEST UR: NEGATIVE

## 2014-05-06 MED ORDER — TRAMADOL HCL 50 MG PO TABS: 50.0000 mg | ORAL_TABLET | Freq: Four times a day (QID) | ORAL | Status: DC | PRN

## 2014-05-06 NOTE — ED Provider Notes (Signed)
Medical screening examination/treatment/procedure(s) were performed by non-physician practitioner and as supervising physician I was immediately available for consultation/collaboration.   Arthor Gorter T Kallan Bischoff, MD 05/06/14 1452 

## 2014-05-06 NOTE — Discharge Instructions (Signed)
Flank Pain °Flank pain refers to pain that is located on the side of the body between the upper abdomen and the back. The pain may occur over a short period of time (acute) or may be long-term or reoccurring (chronic). It may be mild or severe. Flank pain can be caused by many things. °CAUSES  °Some of the more common causes of flank pain include: °· Muscle strains.   °· Muscle spasms.   °· A disease of your spine (vertebral disk disease).   °· A lung infection (pneumonia).   °· Fluid around your lungs (pulmonary edema).   °· A kidney infection.   °· Kidney stones.   °· A very painful skin rash caused by the chickenpox virus (shingles).   °· Gallbladder disease.   °HOME CARE INSTRUCTIONS  °Home care will depend on the cause of your pain. In general, °· Rest as directed by your caregiver. °· Drink enough fluids to keep your urine clear or pale yellow. °· Only take over-the-counter or prescription medicines as directed by your caregiver. Some medicines may help relieve the pain. °· Tell your caregiver about any changes in your pain. °· Follow up with your caregiver as directed. °SEEK IMMEDIATE MEDICAL CARE IF:  °· Your pain is not controlled with medicine.   °· You have new or worsening symptoms. °· Your pain increases.   °· You have abdominal pain.   °· You have shortness of breath.   °· You have persistent nausea or vomiting.   °· You have swelling in your abdomen.   °· You feel faint or pass out.   °· You have blood in your urine. °· You have a fever or persistent symptoms for more than 2-3 days. °· You have a fever and your symptoms suddenly get worse. °MAKE SURE YOU:  °· Understand these instructions. °· Will watch your condition. °· Will get help right away if you are not doing well or get worse. °Document Released: 10/30/2005 Document Revised: 06/02/2012 Document Reviewed: 04/22/2012 °ExitCare® Patient Information ©2015 [employer]. This information is not intended to replace advice given to you by your  health care provider. Make sure you discuss any questions you have with your health care provider. ° °

## 2014-05-06 NOTE — ED Provider Notes (Signed)
CSN: [id number]     Arrival date & time 05/06/14  1152 History   First MD Initiated Contact with Patient 05/06/14 1201     Chief Complaint  Patient presents with  . Dysuria  . Flank Pain     (Consider location/radiation/quality/duration/timing/severity/associated sxs/prior Treatment) HPI Comments: Pt states that she has been having dysuria and frequency times 3 days.states that she is also have left sided flank pain. States that she has a history of pyelonephritis and it feels similar. Denies fever, nausea, vomiting or diarrhea. Has some abdominal pressure  The history is provided by the patient. No language interpreter was used.    Past Medical History  Diagnosis Date  . UTI (lower urinary tract infection)   . BV (bacterial vaginosis)   . UTI (lower urinary tract infection)   . Hypertension   . Kidney infection    Past Surgical History  Procedure Laterality Date  . Cesarean section      x3   Family History  Problem Relation Age of Onset  . Hypertension Mother   . Heart disease Father   . Asthma Brother    History  Substance Use Topics  . Smoking status: Current Every Day Smoker -- 0.25 packs/day  . Smokeless tobacco: Never Used  . Alcohol Use: No   OB History   Grav Para Term Preterm Abortions TAB SAB Ect Mult Living   4 3 1 2 1 1   0 0 3     Review of Systems  Constitutional: Negative.   Respiratory: Negative.   Cardiovascular: Negative.       Allergies  Review of patient's allergies indicates no known allergies.  Home Medications   Prior to Admission medications   Not on File   BP 127/90  Pulse 74  Temp(Src) 98.5 F (36.9 C) (Oral)  Resp 18  Ht 5\' 4"  (1.626 m)  Wt 154 lb (69.854 kg)  BMI 26.42 kg/m2  SpO2 97%  LMP 03/31/2014 Physical Exam  Nursing note and vitals reviewed. Constitutional: She is oriented to person, place, and time. She appears well-developed and well-nourished.  HENT:  Head: Normocephalic and atraumatic.  Cardiovascular:  Normal rate and regular rhythm.   Pulmonary/Chest: Effort normal and breath sounds normal.  Abdominal: Soft. Bowel sounds are normal. There is no CVA tenderness.  Musculoskeletal: Normal range of motion.  Neurological: She is alert and oriented to person, place, and time.  Skin: Skin is warm and dry.  Psychiatric: She has a normal mood and affect.    ED Course  Procedures (including critical care time) Labs Review Labs Reviewed  I-STAT CHEM 8, ED - Abnormal; Notable for the following:    Potassium 3.5 (*)    Creatinine, Ser 1.20 (*)    All other components within normal limits  URINALYSIS, ROUTINE W REFLEX MICROSCOPIC  CBC  POC URINE PREG, ED    Imaging Review Ct Abdomen Pelvis Wo Contrast  05/06/2014   CLINICAL DATA:  Left flank pain and nausea. History of urinary tract infection.  EXAM: CT ABDOMEN AND PELVIS WITHOUT CONTRAST  TECHNIQUE: Multidetector CT imaging of the abdomen and pelvis was performed following the standard protocol without IV contrast.  COMPARISON:  CT abdomen and pelvis 01/18/2013.  FINDINGS: Lung bases are clear.  No pleural or pericardial effusion.  The kidneys appear normal bilaterally. There are no renal or ureteral stones and no hydronephrosis. The liver, gallbladder, adrenal glands, spleen and pancreas appear normal. Uterus, adnexa and urinary bladder are unremarkable. The stomach, small  and large bowel and appendix appear normal. Trace amount of free pelvic fluid is consistent with physiologic change. There is no lymphadenopathy. No bony abnormality is identified.  IMPRESSION: Negative for urinary tract stone.  Negative examination.   Electronically Signed   By: Drusilla Kanner M.D.   On: 05/06/2014 14:03     EKG Interpretation None      MDM   Final diagnoses:  Flank pain    Pt is not pregnant and no infection noted in urine. No stones noted. Will send pt home on ultram. Slight elevation in cr pt notified to have rechecked.    Teressa Lower,  NP 05/06/14 1421

## 2014-05-06 NOTE — ED Notes (Signed)
Patient transported to CT 

## 2014-05-06 NOTE — ED Notes (Signed)
Pt reports dysuria and frequency x 3 days. States today she started to experience left sided flank pain. Hx of poly nephritis. Denies fever/chills. NAD. Denies N/V/D.

## 2014-06-21 ENCOUNTER — Emergency Department (HOSPITAL_COMMUNITY): Payer: Self-pay

## 2014-06-21 ENCOUNTER — Emergency Department (HOSPITAL_COMMUNITY)
Admission: EM | Admit: 2014-06-21 | Discharge: 2014-06-22 | Disposition: A | Discharge status: Home / Self Care | Payer: Self-pay | Attending: Emergency Medicine | Admitting: Emergency Medicine

## 2014-06-21 ENCOUNTER — Encounter (HOSPITAL_COMMUNITY): Payer: Self-pay | Admitting: Emergency Medicine

## 2014-06-21 DIAGNOSIS — R109 Unspecified abdominal pain: Secondary | ICD-10-CM

## 2014-06-21 DIAGNOSIS — Z8619 Personal history of other infectious and parasitic diseases: Secondary | ICD-10-CM | POA: Insufficient documentation

## 2014-06-21 DIAGNOSIS — R1010 Upper abdominal pain, unspecified: Secondary | ICD-10-CM | POA: Insufficient documentation

## 2014-06-21 DIAGNOSIS — I10 Essential (primary) hypertension: Secondary | ICD-10-CM | POA: Insufficient documentation

## 2014-06-21 DIAGNOSIS — Z3202 Encounter for pregnancy test, result negative: Secondary | ICD-10-CM | POA: Insufficient documentation

## 2014-06-21 DIAGNOSIS — M549 Dorsalgia, unspecified: Secondary | ICD-10-CM | POA: Insufficient documentation

## 2014-06-21 DIAGNOSIS — Z87440 Personal history of urinary (tract) infections: Secondary | ICD-10-CM | POA: Insufficient documentation

## 2014-06-21 DIAGNOSIS — Z8742 Personal history of other diseases of the female genital tract: Secondary | ICD-10-CM | POA: Insufficient documentation

## 2014-06-21 DIAGNOSIS — R110 Nausea: Secondary | ICD-10-CM | POA: Insufficient documentation

## 2014-06-21 DIAGNOSIS — Z87448 Personal history of other diseases of urinary system: Secondary | ICD-10-CM | POA: Insufficient documentation

## 2014-06-21 DIAGNOSIS — Z9889 Other specified postprocedural states: Secondary | ICD-10-CM | POA: Insufficient documentation

## 2014-06-21 DIAGNOSIS — F17200 Nicotine dependence, unspecified, uncomplicated: Secondary | ICD-10-CM | POA: Insufficient documentation

## 2014-06-21 LAB — COMPREHENSIVE METABOLIC PANEL
ALBUMIN: 4.3 g/dL (ref 3.5–5.2)
ALT: 16 U/L (ref 0–35)
AST: 19 U/L (ref 0–37)
Alkaline Phosphatase: 57 U/L (ref 39–117)
Anion gap: 12 (ref 5–15)
BILIRUBIN TOTAL: 0.5 mg/dL (ref 0.3–1.2)
BUN: 9 mg/dL (ref 6–23)
CALCIUM: 9.7 mg/dL (ref 8.4–10.5)
CHLORIDE: 100 meq/L (ref 96–112)
CO2: 26 mEq/L (ref 19–32)
CREATININE: 0.95 mg/dL (ref 0.50–1.10)
GFR calc Af Amer: >90 mL/min (ref >90)
GFR calc non Af Amer: 80 mL/min — ABNORMAL LOW (ref >90)
Glucose, Bld: 82 mg/dL (ref 70–99)
Potassium: 4.2 mEq/L (ref 3.7–5.3)
Sodium: 138 mEq/L (ref 137–147)
TOTAL PROTEIN: 8.1 g/dL (ref 6.0–8.3)

## 2014-06-21 LAB — CBC WITH DIFFERENTIAL/PLATELET
BASOS ABS: 0 10*3/uL (ref 0.0–0.1)
Basophils Relative: 1 % (ref 0–1)
EOS ABS: 0.1 10*3/uL (ref 0.0–0.7)
Eosinophils Relative: 2 % (ref 0–5)
HCT: 43.4 % (ref 36.0–46.0)
Hemoglobin: 14.7 g/dL (ref 12.0–15.0)
Lymphocytes Relative: 48 % — ABNORMAL HIGH (ref 12–46)
Lymphs Abs: 2.6 10*3/uL (ref 0.7–4.0)
MCH: 26.9 pg (ref 26.0–34.0)
MCHC: 33.9 g/dL (ref 30.0–36.0)
MCV: 79.5 fL (ref 78.0–100.0)
MONO ABS: 0.5 10*3/uL (ref 0.1–1.0)
Monocytes Relative: 10 % (ref 3–12)
NEUTROS ABS: 2.1 10*3/uL (ref 1.7–7.7)
Neutrophils Relative %: 39 % — ABNORMAL LOW (ref 43–77)
Platelets: 284 10*3/uL (ref 150–400)
RBC: 5.46 MIL/uL — ABNORMAL HIGH (ref 3.87–5.11)
RDW: 15.3 % (ref 11.5–15.5)
WBC: 5.2 10*3/uL (ref 4.0–10.5)

## 2014-06-21 LAB — URINALYSIS, ROUTINE W REFLEX MICROSCOPIC
Bilirubin Urine: NEGATIVE
Glucose, UA: NEGATIVE mg/dL
Hgb urine dipstick: NEGATIVE
Ketones, ur: 15 mg/dL — AB
LEUKOCYTES UA: NEGATIVE
Nitrite: NEGATIVE
PH: 5.5 (ref 5.0–8.0)
Protein, ur: NEGATIVE mg/dL
SPECIFIC GRAVITY, URINE: 1.026 (ref 1.005–1.030)
Urobilinogen, UA: 1 mg/dL (ref 0.0–1.0)

## 2014-06-21 LAB — PREGNANCY, URINE: PREG TEST UR: NEGATIVE

## 2014-06-21 MED ORDER — ONDANSETRON 4 MG PO TBDP
8.0000 mg | ORAL_TABLET | Freq: Once | ORAL | Status: AC
  Administered 2014-06-21: 8 mg via ORAL
  Filled 2014-06-21: qty 2

## 2014-06-21 MED ORDER — MORPHINE SULFATE 4 MG/ML IJ SOLN
4.0000 mg | Freq: Once | INTRAMUSCULAR | Status: AC
  Administered 2014-06-21: 4 mg via INTRAVENOUS
  Filled 2014-06-21: qty 1

## 2014-06-21 MED ORDER — KETOROLAC TROMETHAMINE 30 MG/ML IJ SOLN
30.0000 mg | Freq: Once | INTRAMUSCULAR | Status: AC
  Administered 2014-06-21: 30 mg via INTRAVENOUS
  Filled 2014-06-21: qty 1

## 2014-06-21 MED ORDER — OXYCODONE-ACETAMINOPHEN 5-325 MG PO TABS
1.0000 | ORAL_TABLET | Freq: Once | ORAL | Status: AC
  Administered 2014-06-21: 1 via ORAL
  Filled 2014-06-21: qty 1

## 2014-06-21 MED ORDER — SODIUM CHLORIDE 0.9 % IV BOLUS (SEPSIS)
1000.0000 mL | Freq: Once | INTRAVENOUS | Status: AC
  Administered 2014-06-21: 1000 mL via INTRAVENOUS

## 2014-06-21 NOTE — ED Notes (Signed)
Pt reports right sided lower back pain and radiates to right lower abd. Nausea. No vomiting. Urinary retention.

## 2014-06-21 NOTE — ED Provider Notes (Signed)
CSN: [id number]     Arrival date & time 06/21/14  1548 History   First MD Initiated Contact with Patient 06/21/14 2257     Chief Complaint  Patient presents with  . Back Pain  . Abdominal Pain     (Consider location/radiation/quality/duration/timing/severity/associated sxs/prior Treatment) HPI Lisa HARDEMAN is a 28 y.o. female with past medical history of recurrent UTIs coming in with flank pain. Patient states his occurred for the past 3 days. She describes right-sided flank pain with radiation to her groin. It is sharp and associated with nausea, no emesis. She denies any dysuria or hematuria. Her bowel movements have been normal during the interval. She states her pain is worse with movement. She took a Flexeril earlier tonight which did not provide any pain relief. Patient denies any recent unprotected sex, vaginal discharge, itching or irregular bleeding. There's no chest pain shortness of breath, patient has no further complaints.  10 Systems reviewed and are negative for acute change except as noted in the HPI.     Past Medical History  Diagnosis Date  . UTI (lower urinary tract infection)   . BV (bacterial vaginosis)   . UTI (lower urinary tract infection)   . Hypertension   . Kidney infection    Past Surgical History  Procedure Laterality Date  . Cesarean section      x3   Family History  Problem Relation Age of Onset  . Hypertension Mother   . Heart disease Father   . Asthma Brother    History  Substance Use Topics  . Smoking status: Current Every Day Smoker -- 0.25 packs/day  . Smokeless tobacco: Never Used  . Alcohol Use: No   OB History   Grav Para Term Preterm Abortions TAB SAB Ect Mult Living   4 3 1 2 1 1   0 0 3     Review of Systems    Allergies  Review of patient's allergies indicates no known allergies.  Home Medications   Prior to Admission medications   Medication Sig Start Date End Date Taking? Authorizing Provider   cyclobenzaprine (FLEXERIL) 5 MG tablet Take 5 mg by mouth daily as needed for muscle spasms.   Yes Historical Provider, MD   BP 134/91  Pulse 73  Temp(Src) 98.1 F (36.7 C) (Oral)  Resp 13  SpO2 100%  LMP 06/07/2014 Physical Exam  Nursing note and vitals reviewed. Constitutional: She is oriented to person, place, and time. She appears well-developed and well-nourished. No distress.  HENT:  Head: Normocephalic and atraumatic.  Nose: Nose normal.  Mouth/Throat: Oropharynx is clear and moist. No oropharyngeal exudate.  Eyes: Conjunctivae and EOM are normal. Pupils are equal, round, and reactive to light. No scleral icterus.  Neck: Normal range of motion. Neck supple. No JVD present. No tracheal deviation present. No thyromegaly present.  Cardiovascular: Normal rate, regular rhythm and normal heart sounds.  Exam reveals no gallop and no friction rub.   No murmur heard. Pulmonary/Chest: Effort normal and breath sounds normal. No respiratory distress. She has no wheezes. She exhibits no tenderness.  Abdominal: Soft. Bowel sounds are normal. She exhibits no distension and no mass. There is tenderness. There is no rebound and no guarding.  Right CVA tenderness  Musculoskeletal: Normal range of motion. She exhibits no edema and no tenderness.  Lymphadenopathy:    She has no cervical adenopathy.  Neurological: She is alert and oriented to person, place, and time. No cranial nerve deficit. She exhibits normal muscle tone.  Skin: Skin is warm and dry. No rash noted. She is not diaphoretic. No erythema. No pallor.    ED Course  Procedures (including critical care time) Labs Review Labs Reviewed  CBC WITH DIFFERENTIAL - Abnormal; Notable for the following:    RBC 5.46 (*)    Neutrophils Relative % 39 (*)    Lymphocytes Relative 48 (*)    All other components within normal limits  COMPREHENSIVE METABOLIC PANEL - Abnormal; Notable for the following:    GFR calc non Af Amer 80 (*)    All  other components within normal limits  URINALYSIS, ROUTINE W REFLEX MICROSCOPIC - Abnormal; Notable for the following:    Color, Urine AMBER (*)    Ketones, ur 15 (*)    All other components within normal limits  PREGNANCY, URINE    Imaging Review [facility]  06/22/2014   CLINICAL DATA:  Back pain, evaluate for stone  EXAM: RENAL/URINARY TRACT ULTRASOUND COMPLETE  COMPARISON:  CT abdomen pelvis dated 05/06/2014  FINDINGS: Right Kidney:  Length: 12.1 cm.  No renal calculi, mass, or hydronephrosis.  Left Kidney:  Length: 11.7 cm.  No renal calculi, mass, or hydronephrosis.  Bladder:  Within normal limits.  IMPRESSION: Negative renal ultrasound.   Electronically Signed   By: Charline BillsSriyesh  Krishnan M.D.   On: 06/22/2014 01:49     EKG Interpretation None      MDM   Final diagnoses:  None    Patient presents emergency department out of concern for flank pain. She denies any history of kidney stones. Laboratory studies and urinalysis were unremarkable. She was given morphine Toradol IV fluids in the emergency department. Will obtain renal ultrasound to assess for hydronephrosis.  Patient's pain was significantly improved. She did require repeat dose of morphine. Renal ultrasound did not reveal any hydronephrosis. She'll be given urology followup for evaluation for kidney stone, and a prescription for pain medicine at home. Her vital signs remain within her normal limits and she is safe for discharge. Patient does have someone picking her up and she is drowsy from the medication.    Tomasita CrumbleAdeleke Fallan Mccarey, MD 06/22/14 [phone]

## 2014-06-22 MED ORDER — HYDROCODONE-ACETAMINOPHEN 5-325 MG PO TABS: 1.0000 | ORAL_TABLET | Freq: Two times a day (BID) | ORAL | Status: DC | PRN

## 2014-06-22 MED ORDER — MORPHINE SULFATE 4 MG/ML IJ SOLN
4.0000 mg | Freq: Once | INTRAMUSCULAR | Status: AC
  Administered 2014-06-22: 4 mg via INTRAVENOUS
  Filled 2014-06-22: qty 1

## 2014-06-22 NOTE — ED Notes (Signed)
This RN spoke with Lisa Vazquez in ultrasound, who states that the images from the pt's ultrasound should have crossed over, and to wait 5 more minutes for the results.

## 2014-06-22 NOTE — ED Notes (Signed)
Discharge instructions reviewed with pt. Pt verbalized understanding.   

## 2014-06-22 NOTE — Discharge Instructions (Signed)
Flank Pain Lisa Vazquez, you were seen today for flank pain.  You may have a kidney stone.  Your ultrasound was normal and did not show any swelling of your kidneys.  Follow up with urology within 3 days for continued care.  If your symptoms worsen, come back to the ED for repeat evaluation.  Do not drive while taking narcotic pain killers.  Thank you. Flank pain is pain in your side. The flank is the area of your side between your upper belly (abdomen) and your back. Pain in this area can be caused by many different things. HOME CARE Home care and treatment will depend on the cause of your pain.  Rest as told by your doctor.  Drink enough fluids to keep your pee (urine) clear or pale yellow.  Only take medicine as told by your doctor.  Tell your doctor about any changes in your pain.  Follow up with your doctor. GET HELP RIGHT AWAY IF:   Your pain does not get better with medicine.   You have new symptoms or your symptoms get worse.  Your pain gets worse.   You have belly (abdominal) pain.   You are short of breath.   You always feel sick to your stomach (nauseous).   You keep throwing up (vomiting).   You have puffiness (swelling) in your belly.   You feel light-headed or you pass out (faint).   You have blood in your pee.  You have a fever or lasting symptoms for more than 2-3 days.  You have a fever and your symptoms suddenly get worse. MAKE SURE YOU:   Understand these instructions.  Will watch your condition.  Will get help right away if you are not doing well or get worse. Document Released: 06/17/2008 Document Revised: 01/23/2014 Document Reviewed: 04/22/2012 [facility] Patient Information 2015 [place], [place]. This information is not intended to replace advice given to you by your health care provider. Make sure you discuss any questions you have with your health care provider.

## 2014-07-08 ENCOUNTER — Emergency Department (HOSPITAL_COMMUNITY)
Admission: EM | Admit: 2014-07-08 | Discharge: 2014-07-08 | Disposition: A | Discharge status: Home / Self Care | Payer: Self-pay | Attending: Emergency Medicine | Admitting: Emergency Medicine

## 2014-07-08 ENCOUNTER — Encounter (HOSPITAL_COMMUNITY): Payer: Self-pay | Admitting: Emergency Medicine

## 2014-07-08 DIAGNOSIS — R51 Headache: Secondary | ICD-10-CM | POA: Insufficient documentation

## 2014-07-08 DIAGNOSIS — R110 Nausea: Secondary | ICD-10-CM | POA: Insufficient documentation

## 2014-07-08 DIAGNOSIS — Z8742 Personal history of other diseases of the female genital tract: Secondary | ICD-10-CM | POA: Insufficient documentation

## 2014-07-08 DIAGNOSIS — J069 Acute upper respiratory infection, unspecified: Secondary | ICD-10-CM

## 2014-07-08 DIAGNOSIS — R1084 Generalized abdominal pain: Secondary | ICD-10-CM | POA: Insufficient documentation

## 2014-07-08 DIAGNOSIS — M791 Myalgia: Secondary | ICD-10-CM | POA: Insufficient documentation

## 2014-07-08 DIAGNOSIS — H9209 Otalgia, unspecified ear: Secondary | ICD-10-CM | POA: Insufficient documentation

## 2014-07-08 DIAGNOSIS — Z9889 Other specified postprocedural states: Secondary | ICD-10-CM | POA: Insufficient documentation

## 2014-07-08 DIAGNOSIS — Z720 Tobacco use: Secondary | ICD-10-CM | POA: Insufficient documentation

## 2014-07-08 DIAGNOSIS — Z87440 Personal history of urinary (tract) infections: Secondary | ICD-10-CM | POA: Insufficient documentation

## 2014-07-08 DIAGNOSIS — Z3202 Encounter for pregnancy test, result negative: Secondary | ICD-10-CM | POA: Insufficient documentation

## 2014-07-08 DIAGNOSIS — I10 Essential (primary) hypertension: Secondary | ICD-10-CM | POA: Insufficient documentation

## 2014-07-08 LAB — COMPREHENSIVE METABOLIC PANEL
ALBUMIN: 3.3 g/dL — AB (ref 3.5–5.2)
ALK PHOS: 67 U/L (ref 39–117)
ALT: 22 U/L (ref 0–35)
AST: 27 U/L (ref 0–37)
Anion gap: 13 (ref 5–15)
BUN: 9 mg/dL (ref 6–23)
CO2: 23 mEq/L (ref 19–32)
Calcium: 8.9 mg/dL (ref 8.4–10.5)
Chloride: 103 mEq/L (ref 96–112)
Creatinine, Ser: 0.86 mg/dL (ref 0.50–1.10)
GFR calc Af Amer: >90 mL/min (ref >90)
GFR calc non Af Amer: >90 mL/min (ref >90)
GLUCOSE: 103 mg/dL — AB (ref 70–99)
POTASSIUM: 3.6 meq/L — AB (ref 3.7–5.3)
SODIUM: 139 meq/L (ref 137–147)
Total Bilirubin: 0.2 mg/dL — ABNORMAL LOW (ref 0.3–1.2)
Total Protein: 6.9 g/dL (ref 6.0–8.3)

## 2014-07-08 LAB — URINALYSIS, ROUTINE W REFLEX MICROSCOPIC
BILIRUBIN URINE: NEGATIVE
Glucose, UA: NEGATIVE mg/dL
Ketones, ur: NEGATIVE mg/dL
Leukocytes, UA: NEGATIVE
NITRITE: NEGATIVE
PH: 6.5 (ref 5.0–8.0)
Protein, ur: NEGATIVE mg/dL
SPECIFIC GRAVITY, URINE: 1.023 (ref 1.005–1.030)
Urobilinogen, UA: 1 mg/dL (ref 0.0–1.0)

## 2014-07-08 LAB — CBC WITH DIFFERENTIAL/PLATELET
BASOS PCT: 0 % (ref 0–1)
Basophils Absolute: 0 10*3/uL (ref 0.0–0.1)
Eosinophils Absolute: 0.1 10*3/uL (ref 0.0–0.7)
Eosinophils Relative: 1 % (ref 0–5)
HCT: 38.6 % (ref 36.0–46.0)
HEMOGLOBIN: 13.1 g/dL (ref 12.0–15.0)
LYMPHS ABS: 2.2 10*3/uL (ref 0.7–4.0)
Lymphocytes Relative: 15 % (ref 12–46)
MCH: 26.3 pg (ref 26.0–34.0)
MCHC: 33.9 g/dL (ref 30.0–36.0)
MCV: 77.5 fL — ABNORMAL LOW (ref 78.0–100.0)
MONOS PCT: 12 % (ref 3–12)
Monocytes Absolute: 1.7 10*3/uL — ABNORMAL HIGH (ref 0.1–1.0)
NEUTROS ABS: 10.4 10*3/uL — AB (ref 1.7–7.7)
NEUTROS PCT: 72 % (ref 43–77)
Platelets: 232 10*3/uL (ref 150–400)
RBC: 4.98 MIL/uL (ref 3.87–5.11)
RDW: 15.3 % (ref 11.5–15.5)
WBC: 14.4 10*3/uL — AB (ref 4.0–10.5)

## 2014-07-08 LAB — URINE MICROSCOPIC-ADD ON

## 2014-07-08 LAB — PREGNANCY, URINE: Preg Test, Ur: NEGATIVE

## 2014-07-08 LAB — RAPID STREP SCREEN (MED CTR MEBANE ONLY): STREPTOCOCCUS, GROUP A SCREEN (DIRECT): NEGATIVE

## 2014-07-08 MED ORDER — METHOCARBAMOL 500 MG PO TABS: 1000.0000 mg | ORAL_TABLET | Freq: Three times a day (TID) | ORAL | Status: DC | PRN

## 2014-07-08 MED ORDER — AZITHROMYCIN 250 MG PO TABS: ORAL_TABLET | ORAL | Status: DC

## 2014-07-08 MED ORDER — METHOCARBAMOL 500 MG PO TABS
1000.0000 mg | ORAL_TABLET | Freq: Once | ORAL | Status: AC
  Administered 2014-07-08: 1000 mg via ORAL
  Filled 2014-07-08: qty 2

## 2014-07-08 MED ORDER — LORATADINE 10 MG PO TABS: 10.0000 mg | ORAL_TABLET | Freq: Every day | ORAL | Status: DC

## 2014-07-08 MED ORDER — SODIUM CHLORIDE 0.9 % IV BOLUS (SEPSIS)
1000.0000 mL | Freq: Once | INTRAVENOUS | Status: AC
  Administered 2014-07-08: 1000 mL via INTRAVENOUS

## 2014-07-08 MED ORDER — KETOROLAC TROMETHAMINE 30 MG/ML IJ SOLN
30.0000 mg | Freq: Once | INTRAMUSCULAR | Status: AC
  Administered 2014-07-08: 30 mg via INTRAVENOUS
  Filled 2014-07-08: qty 1

## 2014-07-08 MED ORDER — OXYMETAZOLINE HCL 0.05 % NA SOLN: 1.0000 | Freq: Two times a day (BID) | NASAL | Status: DC

## 2014-07-08 MED ORDER — IBUPROFEN 600 MG PO TABS: 600.0000 mg | ORAL_TABLET | Freq: Four times a day (QID) | ORAL | Status: DC | PRN

## 2014-07-08 NOTE — ED Notes (Signed)
Pt c/o of 10/10 generalize body ache since yesterday, that is getting progressively worse today, pt states she is been having fever, chills and nausea, denies any vomiting or diarrhea. VSS, NAD noticed.

## 2014-07-08 NOTE — Discharge Instructions (Signed)
Upper Respiratory Infection, Adult An upper respiratory infection (URI) is also sometimes known as the common cold. The upper respiratory tract includes the nose, sinuses, throat, trachea, and bronchi. Bronchi are the airways leading to the lungs. Most people improve within 1 week, but symptoms can last up to 2 weeks. A residual cough may last even longer.  CAUSES Many different viruses can infect the tissues lining the upper respiratory tract. The tissues become irritated and inflamed and often become very moist. Mucus production is also common. A cold is contagious. You can easily spread the virus to others by oral contact. This includes kissing, sharing a glass, coughing, or sneezing. Touching your mouth or nose and then touching a surface, which is then touched by another person, can also spread the virus. SYMPTOMS  Symptoms typically develop 1 to 3 days after you come in contact with a cold virus. Symptoms vary from person to person. They may include:  Runny nose.  Sneezing.  Nasal congestion.  Sinus irritation.  Sore throat.  Loss of voice (laryngitis).  Cough.  Fatigue.  Muscle aches.  Loss of appetite.  Headache.  Low-grade fever. DIAGNOSIS  You might diagnose your own cold based on familiar symptoms, since most people get a cold 2 to 3 times a year. Your caregiver can confirm this based on your exam. Most importantly, your caregiver can check that your symptoms are not due to another disease such as strep throat, sinusitis, pneumonia, asthma, or epiglottitis. Blood tests, throat tests, and X-rays are not necessary to diagnose a common cold, but they may sometimes be helpful in excluding other more serious diseases. Your caregiver will decide if any further tests are required. RISKS AND COMPLICATIONS  You may be at risk for a more severe case of the common cold if you smoke cigarettes, have chronic heart disease (such as heart failure) or lung disease (such as asthma), or if  you have a weakened immune system. The very young and very old are also at risk for more serious infections. Bacterial sinusitis, middle ear infections, and bacterial pneumonia can complicate the common cold. The common cold can worsen asthma and chronic obstructive pulmonary disease (COPD). Sometimes, these complications can require emergency medical care and may be life-threatening. PREVENTION  The best way to protect against getting a cold is to practice good hygiene. Avoid oral or hand contact with people with cold symptoms. Wash your hands often if contact occurs. There is no clear evidence that vitamin C, vitamin E, echinacea, or exercise reduces the chance of developing a cold. However, it is always recommended to get plenty of rest and practice good nutrition. TREATMENT  Treatment is directed at relieving symptoms. There is no cure. Antibiotics are not effective, because the infection is caused by a virus, not by bacteria. Treatment may include:  Increased fluid intake. Sports drinks offer valuable electrolytes, sugars, and fluids.  Breathing heated mist or steam (vaporizer or shower).  Eating chicken soup or other clear broths, and maintaining good nutrition.  Getting plenty of rest.  Using gargles or lozenges for comfort.  Controlling fevers with ibuprofen or acetaminophen as directed by your caregiver.  Increasing usage of your inhaler if you have asthma. Zinc gel and zinc lozenges, taken in the first 24 hours of the common cold, can shorten the duration and lessen the severity of symptoms. Pain medicines may help with fever, muscle aches, and throat pain. A variety of non-prescription medicines are available to treat congestion and runny nose. Your caregiver   can make recommendations and may suggest nasal or lung inhalers for other symptoms.  HOME CARE INSTRUCTIONS   Only take over-the-counter or prescription medicines for pain, discomfort, or fever as directed by your  caregiver.  Use a warm mist humidifier or inhale steam from a shower to increase air moisture. This may keep secretions moist and make it easier to breathe.  Drink enough water and fluids to keep your urine clear or pale yellow.  Rest as needed.  Return to work when your temperature has returned to normal or as your caregiver advises. You may need to stay home longer to avoid infecting others. You can also use a face mask and careful hand washing to prevent spread of the virus. SEEK MEDICAL CARE IF:   After the first few days, you feel you are getting worse rather than better.  You need your caregiver's advice about medicines to control symptoms.  You develop chills, worsening shortness of breath, or brown or red sputum. These may be signs of pneumonia.  You develop yellow or brown nasal discharge or pain in the face, especially when you bend forward. These may be signs of sinusitis.  You develop a fever, swollen neck glands, pain with swallowing, or white areas in the back of your throat. These may be signs of strep throat. SEEK IMMEDIATE MEDICAL CARE IF:   You have a fever.  You develop severe or persistent headache, ear pain, sinus pain, or chest pain.  You develop wheezing, a prolonged cough, cough up blood, or have a change in your usual mucus (if you have chronic lung disease).  You develop sore muscles or a stiff neck. Document Released: 03/04/2001 Document Revised: 12/01/2011 Document Reviewed: 12/14/2013 [employer] Patient Information 2015 [employer]. This information is not intended to replace advice given to you by your health care provider. Make sure you discuss any questions you have with your health care provider.  

## 2014-07-08 NOTE — ED Provider Notes (Signed)
CSN: [id number]     Arrival date & time 07/08/14  [id number] History   First MD Initiated Contact with Patient 07/08/14 0446     Chief Complaint  Patient presents with  . Sore Throat  . Generalized Body Aches     (Consider location/radiation/quality/duration/timing/severity/associated sxs/prior Treatment) HPI Patient presents with one day of generalized body aches, nasal congestion and sinus pressure, nausea, subjective fevers and chills and sore throat. She denies any vomiting or diarrhea. States she has diffuse abdominal pain. Denies urinary symptoms. She's had no vaginal discharge or bleeding. No known sick contacts. No neck pain or stiffness. Past Medical History  Diagnosis Date  . UTI (lower urinary tract infection)   . BV (bacterial vaginosis)   . UTI (lower urinary tract infection)   . Hypertension   . Kidney infection    Past Surgical History  Procedure Laterality Date  . Cesarean section      x3   Family History  Problem Relation Age of Onset  . Hypertension Mother   . Heart disease Father   . Asthma Brother    History  Substance Use Topics  . Smoking status: Current Every Day Smoker -- 0.25 packs/day  . Smokeless tobacco: Current User  . Alcohol Use: No   OB History   Grav Para Term Preterm Abortions TAB SAB Ect Mult Living   4 3 1 2 1 1   0 0 3     Review of Systems  Constitutional: Positive for fever, chills and fatigue.  HENT: Positive for congestion, ear pain, rhinorrhea, sinus pressure and sore throat.   Eyes: Negative for visual disturbance.  Respiratory: Negative for cough, chest tightness and shortness of breath.   Cardiovascular: Negative for chest pain, palpitations and leg swelling.  Gastrointestinal: Positive for nausea and abdominal pain. Negative for vomiting, diarrhea and constipation.  Genitourinary: Negative for dysuria, frequency, hematuria, flank pain, vaginal bleeding and vaginal discharge.  Musculoskeletal: Positive for myalgias. Negative  for back pain, neck pain and neck stiffness.  Skin: Negative for pallor, rash and wound.  Neurological: Positive for headaches. Negative for dizziness, syncope, weakness, light-headedness and numbness.  All other systems reviewed and are negative.     Allergies  Review of patient's allergies indicates no known allergies.  Home Medications   Prior to Admission medications   Medication Sig Start Date End Date Taking? Authorizing Provider  cyclobenzaprine (FLEXERIL) 5 MG tablet Take 5 mg by mouth daily as needed for muscle spasms.    Historical Provider, MD  HYDROcodone-acetaminophen (NORCO/VICODIN) 5-325 MG per tablet Take 1 tablet by mouth 2 (two) times daily as needed for severe pain. 06/22/14   Tomasita CrumbleAdeleke Oni, MD   BP 133/90  Pulse 70  Temp(Src) 97.8 F (36.6 C) (Oral)  Resp 17  Ht 5\' 4"  (1.626 m)  Wt 154 lb (69.854 kg)  BMI 26.42 kg/m2  SpO2 98%  LMP 06/07/2014 Physical Exam  Nursing note and vitals reviewed. Constitutional: She is oriented to person, place, and time. She appears well-developed and well-nourished. No distress.  HENT:  Head: Normocephalic and atraumatic.  Mouth/Throat: Oropharynx is clear and moist. No oropharyngeal exudate.  Bilateral nasal mucosal edema. Slight bulging bilateral TMs. Oropharynx is erythematous without exudates.  Eyes: EOM are normal. Pupils are equal, round, and reactive to light.  Neck: Normal range of motion. Neck supple.  No meningismus  Cardiovascular: Normal rate and regular rhythm.  Exam reveals no gallop and no friction rub.   No murmur heard. Pulmonary/Chest: Effort normal and  breath sounds normal. No respiratory distress. She has no wheezes. She has no rales. She exhibits no tenderness.  Abdominal: Soft. Bowel sounds are normal. She exhibits no distension and no mass. There is tenderness (mild diffuse abdominal tenderness. No rebound or guarding.). There is no rebound and no guarding.  Musculoskeletal: Normal range of motion. She  exhibits no edema and no tenderness.  No CVA tenderness bilaterally.  Lymphadenopathy:    She has no cervical adenopathy.  Neurological: She is alert and oriented to person, place, and time.  Moves all extremities without deficit. Sensation is grossly intact.  Skin: Skin is warm and dry. No rash noted. No erythema.  Psychiatric: She has a normal mood and affect. Her behavior is normal.    ED Course  Procedures (including critical care time) Labs Review Labs Reviewed  RAPID STREP SCREEN  CBC WITH DIFFERENTIAL  COMPREHENSIVE METABOLIC PANEL  URINALYSIS, ROUTINE W REFLEX MICROSCOPIC  PREGNANCY, URINE    Imaging Review No results found.   EKG Interpretation None      MDM   Final diagnoses:  None   Patient feels better after medication and fluids. We'll discharge home with treatment for URI. Return precautions given.     Loren Raceravid Rhen Kawecki, MD 07/09/14 [phone]

## 2014-07-10 LAB — CULTURE, GROUP A STREP

## 2014-07-24 ENCOUNTER — Encounter (HOSPITAL_COMMUNITY): Payer: Self-pay | Admitting: Emergency Medicine

## 2014-10-19 ENCOUNTER — Emergency Department (HOSPITAL_COMMUNITY)
Admission: EM | Admit: 2014-10-19 | Discharge: 2014-10-19 | Disposition: A | Discharge status: Home / Self Care | Payer: Self-pay | Attending: Emergency Medicine | Admitting: Emergency Medicine

## 2014-10-19 ENCOUNTER — Encounter (HOSPITAL_COMMUNITY): Payer: Self-pay | Admitting: Emergency Medicine

## 2014-10-19 ENCOUNTER — Emergency Department (HOSPITAL_COMMUNITY): Payer: Self-pay

## 2014-10-19 DIAGNOSIS — S4991XA Unspecified injury of right shoulder and upper arm, initial encounter: Secondary | ICD-10-CM | POA: Insufficient documentation

## 2014-10-19 DIAGNOSIS — Z792 Long term (current) use of antibiotics: Secondary | ICD-10-CM | POA: Insufficient documentation

## 2014-10-19 DIAGNOSIS — M25511 Pain in right shoulder: Secondary | ICD-10-CM

## 2014-10-19 DIAGNOSIS — W108XXA Fall (on) (from) other stairs and steps, initial encounter: Secondary | ICD-10-CM | POA: Insufficient documentation

## 2014-10-19 DIAGNOSIS — Y9289 Other specified places as the place of occurrence of the external cause: Secondary | ICD-10-CM | POA: Insufficient documentation

## 2014-10-19 DIAGNOSIS — Z87440 Personal history of urinary (tract) infections: Secondary | ICD-10-CM | POA: Insufficient documentation

## 2014-10-19 DIAGNOSIS — M25531 Pain in right wrist: Secondary | ICD-10-CM

## 2014-10-19 DIAGNOSIS — Z720 Tobacco use: Secondary | ICD-10-CM | POA: Insufficient documentation

## 2014-10-19 DIAGNOSIS — Z79899 Other long term (current) drug therapy: Secondary | ICD-10-CM | POA: Insufficient documentation

## 2014-10-19 DIAGNOSIS — Y9389 Activity, other specified: Secondary | ICD-10-CM | POA: Insufficient documentation

## 2014-10-19 DIAGNOSIS — W19XXXA Unspecified fall, initial encounter: Secondary | ICD-10-CM

## 2014-10-19 DIAGNOSIS — Z8742 Personal history of other diseases of the female genital tract: Secondary | ICD-10-CM | POA: Insufficient documentation

## 2014-10-19 DIAGNOSIS — S6991XA Unspecified injury of right wrist, hand and finger(s), initial encounter: Secondary | ICD-10-CM | POA: Insufficient documentation

## 2014-10-19 DIAGNOSIS — I10 Essential (primary) hypertension: Secondary | ICD-10-CM | POA: Insufficient documentation

## 2014-10-19 DIAGNOSIS — Y998 Other external cause status: Secondary | ICD-10-CM | POA: Insufficient documentation

## 2014-10-19 MED ORDER — IBUPROFEN 800 MG PO TABS: 800.0000 mg | ORAL_TABLET | Freq: Three times a day (TID) | ORAL | Status: DC

## 2014-10-19 MED ORDER — OXYCODONE-ACETAMINOPHEN 5-325 MG PO TABS
1.0000 | ORAL_TABLET | Freq: Once | ORAL | Status: AC
  Administered 2014-10-19: 1 via ORAL
  Filled 2014-10-19: qty 1

## 2014-10-19 NOTE — ED Provider Notes (Signed)
CSN: [id number]     Arrival date & time 10/19/14  1415 History  This chart was scribed for Roxy Horsemanobert Delano Frate, PA-C working with Richardean Canalavid H Yao, MD by Evon Slackerrance Branch, ED Scribe. This patient was seen in room TR05C/TR05C and the patient's care was started at 3:59 PM.      Chief Complaint  Patient presents with  . Fall   The history is provided by the patient. No language interpreter was used.   HPI Comments: Lisa Vazquez is a 29 y.o. female who presents to the Emergency Department complaining of fall onset 1 day ago. Pt states that she slid down stairs at her house. Pt states she doesn't remember how she fell but states she fell on to her right arm. Pt is complaining of right arm pain and back pain. Pt states she has tried a muscle relaxant and tramadol with no relief. Pt denies gait problem.  Past Medical History  Diagnosis Date  . UTI (lower urinary tract infection)   . BV (bacterial vaginosis)   . UTI (lower urinary tract infection)   . Hypertension   . Kidney infection    Past Surgical History  Procedure Laterality Date  . Cesarean section      x3   Family History  Problem Relation Age of Onset  . Hypertension Mother   . Heart disease Father   . Asthma Brother    History  Substance Use Topics  . Smoking status: Current Every Day Smoker -- 0.25 packs/day  . Smokeless tobacco: Current User  . Alcohol Use: No   OB History    Gravida Para Term Preterm AB TAB SAB Ectopic Multiple Living   4 3 1 2 1 1   0 0 3     Review of Systems  Constitutional: Negative for fever and chills.  Respiratory: Negative for shortness of breath.   Cardiovascular: Negative for chest pain.  Gastrointestinal: Negative for nausea, vomiting, diarrhea and constipation.  Genitourinary: Negative for dysuria.  Musculoskeletal: Positive for back pain and arthralgias. Negative for gait problem.    Allergies  Review of patient's allergies indicates no known allergies.  Home Medications   Prior  to Admission medications   Medication Sig Start Date End Date Taking? Authorizing Provider  azithromycin (ZITHROMAX Z-PAK) 250 MG tablet 2 po day one, then 1 daily x 4 days 07/08/14   Loren Raceravid Yelverton, MD  ibuprofen (ADVIL,MOTRIN) 600 MG tablet Take 1 tablet (600 mg total) by mouth every 6 (six) hours as needed. 07/08/14   Loren Raceravid Yelverton, MD  loratadine (CLARITIN) 10 MG tablet Take 1 tablet (10 mg total) by mouth daily. 07/08/14   Loren Raceravid Yelverton, MD  methocarbamol (ROBAXIN) 500 MG tablet Take 2 tablets (1,000 mg total) by mouth every 8 (eight) hours as needed for muscle spasms. 07/08/14   Loren Raceravid Yelverton, MD  oxymetazoline (AFRIN NASAL SPRAY) 0.05 % nasal spray Place 1 spray into both nostrils 2 (two) times daily. 07/08/14   Loren Raceravid Yelverton, MD   BP 138/96 mmHg  Pulse 74  Temp(Src) 98.3 F (36.8 C) (Oral)  Resp 18  Ht 5\' 4"  (1.626 m)  Wt 170 lb (77.111 kg)  BMI 29.17 kg/m2  SpO2 98%  LMP 08/31/2014   Physical Exam  Constitutional: She is oriented to person, place, and time. She appears well-developed and well-nourished. No distress.  HENT:  Head: Normocephalic and atraumatic.  Eyes: Conjunctivae and EOM are normal. Right eye exhibits no discharge. Left eye exhibits no discharge. No scleral icterus.  Neck:  Normal range of motion. Neck supple. No tracheal deviation present.  Cardiovascular: Normal rate, regular rhythm and normal heart sounds.  Exam reveals no gallop and no friction rub.   No murmur heard. Intact distal pulses brisk capillary refill.   Pulmonary/Chest: Effort normal and breath sounds normal. No respiratory distress. She has no wheezes.  Abdominal: Soft. She exhibits no distension. There is no tenderness.  Musculoskeletal: Normal range of motion.  Mild cervical, thoracic, and lumbar paraspinal muscles tender to palpation, no bony tenderness, step-offs, or gross abnormality or deformity of spine, patient is able to ambulate, moves all extremities  Right arm ttp in the  shoulder, elbow, and hand, ROM 5/5, strength 4/5 limited by pain, no bony abnormality or deformity    Neurological: She is alert and oriented to person, place, and time. She has normal reflexes.  Sensation and strength intact bilaterally   Skin: Skin is warm and dry. She is not diaphoretic.  Psychiatric: She has a normal mood and affect. Her behavior is normal. Judgment and thought content normal.  Nursing note and vitals reviewed.   ED Course  Procedures (including critical care time) DIAGNOSTIC STUDIES: Oxygen Saturation is 95% on RA, normal by my interpretation.    COORDINATION OF CARE: 4:12 PM-Discussed treatment plan with pt at bedside and pt agreed to plan.     Labs Review Labs Reviewed - No data to display  Imaging Review Dg Shoulder Right  10/19/2014   CLINICAL DATA:  Status post fall 10/18/2014. Right shoulder pain. Initial encounter.  EXAM: RIGHT SHOULDER - 2+ VIEW  COMPARISON:  None.  FINDINGS: Imaged bones, joints and soft tissues appear normal.  IMPRESSION: Negative exam.   Electronically Signed   By: Drusilla Kanner M.D.   On: 10/19/2014 18:48   Dg Forearm Right  10/19/2014   CLINICAL DATA:  29 year old female with right forearm in hand pain after falling at her home yesterday  EXAM: RIGHT FOREARM - 2 VIEW  COMPARISON:  Concurrently obtained radiographs of the shoulder and right hand that  FINDINGS: There is no evidence of fracture or other focal bone lesions. Soft tissues are unremarkable.  IMPRESSION: Negative.   Electronically Signed   By: Malachy Moan M.D.   On: 10/19/2014 18:49   Dg Hand Complete Right  10/19/2014   CLINICAL DATA:  29 year old female with a right hand and arm pain after falling in her home yesterday.  EXAM: RIGHT HAND - COMPLETE 3+ VIEW  COMPARISON:  Concurrently obtained radiographs of the forearm and shoulder.  FINDINGS: There is no evidence of fracture or dislocation. There is no evidence of arthropathy or other focal bone abnormality. Soft  tissues are unremarkable.  IMPRESSION: Negative.   Electronically Signed   By: Malachy Moan M.D.   On: 10/19/2014 18:49   Images of shoulder, hand, and forearm reviewed in the PACS system by me personally, I agree with radiologist's impression.    EKG Interpretation None      MDM   Final diagnoses:  Fall  Shoulder pain, acute, right  Wrist pain, acute, right    Patient with mechanical fall. Imaging is negative. Improved in the emergency department. Patient repeatedly asks for Percocet. At this time I do not feel that narcotic pain medicine would benefit the patient as whole given the associated risks. I have encouraged her to take ibuprofen 3 times daily with food, and will provide her with a sling and wrist splint.  Recommend primary care follow-up.   I personally performed the  services described in this documentation, which was scribed in my presence. The recorded information has been reviewed and is accurate.       Roxy Horseman, PA-C 10/19/14 1901  Richardean Canal, MD 10/19/14 [phone]

## 2014-10-19 NOTE — ED Notes (Signed)
Contacted xray for delay; imaging had difficulty crossing over. Patient updated on plan of care

## 2014-10-19 NOTE — Discharge Instructions (Signed)

## 2014-10-19 NOTE — ED Notes (Signed)
Pt c/o fall down stairs last night c/o pain in right arm and upper right side of back

## 2014-10-30 ENCOUNTER — Emergency Department (HOSPITAL_COMMUNITY): Payer: Self-pay

## 2014-10-30 ENCOUNTER — Emergency Department (HOSPITAL_COMMUNITY)
Admission: EM | Admit: 2014-10-30 | Discharge: 2014-10-30 | Disposition: A | Discharge status: Home / Self Care | Payer: Self-pay | Attending: Emergency Medicine | Admitting: Emergency Medicine

## 2014-10-30 ENCOUNTER — Encounter (HOSPITAL_COMMUNITY): Payer: Self-pay | Admitting: Emergency Medicine

## 2014-10-30 DIAGNOSIS — O2301 Infections of kidney in pregnancy, first trimester: Secondary | ICD-10-CM | POA: Insufficient documentation

## 2014-10-30 DIAGNOSIS — N760 Acute vaginitis: Secondary | ICD-10-CM

## 2014-10-30 DIAGNOSIS — Z87448 Personal history of other diseases of urinary system: Secondary | ICD-10-CM | POA: Insufficient documentation

## 2014-10-30 DIAGNOSIS — O9989 Other specified diseases and conditions complicating pregnancy, childbirth and the puerperium: Secondary | ICD-10-CM | POA: Insufficient documentation

## 2014-10-30 DIAGNOSIS — F17210 Nicotine dependence, cigarettes, uncomplicated: Secondary | ICD-10-CM | POA: Insufficient documentation

## 2014-10-30 DIAGNOSIS — O99411 Diseases of the circulatory system complicating pregnancy, first trimester: Secondary | ICD-10-CM | POA: Insufficient documentation

## 2014-10-30 DIAGNOSIS — R102 Pelvic and perineal pain: Secondary | ICD-10-CM

## 2014-10-30 DIAGNOSIS — Z79899 Other long term (current) drug therapy: Secondary | ICD-10-CM | POA: Insufficient documentation

## 2014-10-30 DIAGNOSIS — I10 Essential (primary) hypertension: Secondary | ICD-10-CM | POA: Insufficient documentation

## 2014-10-30 DIAGNOSIS — O26899 Other specified pregnancy related conditions, unspecified trimester: Secondary | ICD-10-CM

## 2014-10-30 DIAGNOSIS — Z792 Long term (current) use of antibiotics: Secondary | ICD-10-CM | POA: Insufficient documentation

## 2014-10-30 DIAGNOSIS — O99331 Smoking (tobacco) complicating pregnancy, first trimester: Secondary | ICD-10-CM | POA: Insufficient documentation

## 2014-10-30 DIAGNOSIS — B9689 Other specified bacterial agents as the cause of diseases classified elsewhere: Secondary | ICD-10-CM

## 2014-10-30 DIAGNOSIS — Z87440 Personal history of urinary (tract) infections: Secondary | ICD-10-CM | POA: Insufficient documentation

## 2014-10-30 DIAGNOSIS — Z3A01 Less than 8 weeks gestation of pregnancy: Secondary | ICD-10-CM | POA: Insufficient documentation

## 2014-10-30 DIAGNOSIS — O23591 Infection of other part of genital tract in pregnancy, first trimester: Secondary | ICD-10-CM | POA: Insufficient documentation

## 2014-10-30 DIAGNOSIS — O2300 Infections of kidney in pregnancy, unspecified trimester: Secondary | ICD-10-CM

## 2014-10-30 LAB — WET PREP, GENITAL
Trich, Wet Prep: NONE SEEN
Yeast Wet Prep HPF POC: NONE SEEN

## 2014-10-30 LAB — URINE MICROSCOPIC-ADD ON

## 2014-10-30 LAB — URINALYSIS, ROUTINE W REFLEX MICROSCOPIC
BILIRUBIN URINE: NEGATIVE
Glucose, UA: NEGATIVE mg/dL
KETONES UR: 15 mg/dL — AB
Nitrite: NEGATIVE
Protein, ur: NEGATIVE mg/dL
Specific Gravity, Urine: 1.019 (ref 1.005–1.030)
UROBILINOGEN UA: 0.2 mg/dL (ref 0.0–1.0)
pH: 5.5 (ref 5.0–8.0)

## 2014-10-30 LAB — ABO/RH: ABO/RH(D): A POS

## 2014-10-30 LAB — PREGNANCY, URINE: PREG TEST UR: POSITIVE — AB

## 2014-10-30 LAB — HCG, QUANTITATIVE, PREGNANCY: HCG, BETA CHAIN, QUANT, S: 40881 m[IU]/mL — AB (ref <5)

## 2014-10-30 MED ORDER — METRONIDAZOLE 0.75 % VA GEL: 1.0000 | Freq: Two times a day (BID) | VAGINAL | Status: AC

## 2014-10-30 MED ORDER — AZITHROMYCIN 250 MG PO TABS
1000.0000 mg | ORAL_TABLET | Freq: Once | ORAL | Status: AC
  Administered 2014-10-30: 1000 mg via ORAL
  Filled 2014-10-30: qty 4

## 2014-10-30 MED ORDER — CEPHALEXIN 500 MG PO CAPS: 500.0000 mg | ORAL_CAPSULE | Freq: Four times a day (QID) | ORAL | Status: DC

## 2014-10-30 MED ORDER — ACETAMINOPHEN 500 MG PO TABS: 500.0000 mg | ORAL_TABLET | Freq: Four times a day (QID) | ORAL | Status: DC | PRN

## 2014-10-30 MED ORDER — MORPHINE SULFATE 4 MG/ML IJ SOLN
4.0000 mg | Freq: Once | INTRAMUSCULAR | Status: AC
  Administered 2014-10-30: 4 mg via INTRAVENOUS
  Filled 2014-10-30: qty 1

## 2014-10-30 MED ORDER — CEFTRIAXONE SODIUM 1 G IJ SOLR
1.0000 g | Freq: Once | INTRAMUSCULAR | Status: AC
  Administered 2014-10-30: 1 g via INTRAVENOUS
  Filled 2014-10-30: qty 10

## 2014-10-30 MED ORDER — METOCLOPRAMIDE HCL 5 MG/ML IJ SOLN
10.0000 mg | Freq: Once | INTRAMUSCULAR | Status: AC
  Administered 2014-10-30: 10 mg via INTRAVENOUS
  Filled 2014-10-30: qty 2

## 2014-10-30 MED ORDER — SODIUM CHLORIDE 0.9 % IV BOLUS (SEPSIS)
1000.0000 mL | Freq: Once | INTRAVENOUS | Status: AC
  Administered 2014-10-30: 1000 mL via INTRAVENOUS

## 2014-10-30 NOTE — ED Notes (Signed)
PA offered tylenol for pain, patient refused, stating "that's like taking gummy bears".

## 2014-10-30 NOTE — ED Notes (Signed)
Pelvic cart set up at bedside  

## 2014-10-30 NOTE — ED Notes (Signed)
Patient transported to Ultrasound 

## 2014-10-30 NOTE — ED Notes (Signed)
Patient states abdominal pain and back pain that started x 2 days ago.   Patient states she has burning when she urinates.   Patient states has not taken anything at home for symptom control.   Patient states nausea and vomiting.

## 2014-10-30 NOTE — ED Provider Notes (Signed)
CSN: [id number]     Arrival date & time 10/30/14  9562 History   First MD Initiated Contact with Patient 10/30/14 0901     No chief complaint on file.    (Consider location/radiation/quality/duration/timing/severity/associated sxs/prior Treatment) HPI   29 year old female with history of recurrent urinary tract infection who presents with complaint of dysuria. Patient states for the past 3 days she has had suprapubic and lower abdominal pain which she described as a stabbing sensation along with urinary frequency and urgency. States the pain is now radiates to her back towards the kidney region, right greater than left. No specific treatment tried. Symptoms felt similar to prior to urinary tract infection. She endorses subjective fever, chills, myalgias, and having nausea with occasional bouts of nonbloody nonbilious vomiting. Last menstrual period was January 8. She denies any prior history of STD, denies pain with sexual activities, no associated vaginal bleeding or vaginal discharge. She is sexually active with one partner using protection. She does not have a primary care doctor. Her last antibiotic use was cephalexin, and states that it works well.   Past Medical History  Diagnosis Date  . UTI (lower urinary tract infection)   . BV (bacterial vaginosis)   . UTI (lower urinary tract infection)   . Hypertension   . Kidney infection    Past Surgical History  Procedure Laterality Date  . Cesarean section      x3   Family History  Problem Relation Age of Onset  . Hypertension Mother   . Heart disease Father   . Asthma Brother    History  Substance Use Topics  . Smoking status: Current Every Day Smoker -- 0.25 packs/day  . Smokeless tobacco: Current User  . Alcohol Use: No   OB History    Gravida Para Term Preterm AB TAB SAB Ectopic Multiple Living   0 0 3     Review of Systems  All other systems reviewed and are negative.     Allergies  Review of patient's  allergies indicates no known allergies.  Home Medications   Prior to Admission medications   Medication Sig Start Date End Date Taking? Authorizing Provider  azithromycin (ZITHROMAX Z-PAK) 250 MG tablet 2 po day one, then 1 daily x 4 days 07/08/14   Loren Racer, MD  ibuprofen (ADVIL,MOTRIN) 800 MG tablet Take 1 tablet (800 mg total) by mouth 3 (three) times daily. 10/19/14   Roxy Horseman, PA-C  loratadine (CLARITIN) 10 MG tablet Take 1 tablet (10 mg total) by mouth daily. 07/08/14   Loren Racer, MD  methocarbamol (ROBAXIN) 500 MG tablet Take 2 tablets (1,000 mg total) by mouth every 8 (eight) hours as needed for muscle spasms. 07/08/14   Loren Racer, MD  oxymetazoline (AFRIN NASAL SPRAY) 0.05 % nasal spray Place 1 spray into both nostrils 2 (two) times daily. 07/08/14   Loren Racer, MD   LMP 08/31/2014 Physical Exam  Constitutional: She appears well-developed and well-nourished. No distress.  HENT:  Head: Atraumatic.  Eyes: Conjunctivae are normal.  Neck: Neck supple.  Cardiovascular: Normal rate and regular rhythm.   Pulmonary/Chest: Effort normal and breath sounds normal.  Abdominal: Soft. Bowel sounds are normal. There is tenderness (suprapubic tenderness to palpation without guarding or rebound tenderness. Negative Murphy sign, no pain at McBurney's point.).  Genitourinary:  CVA tenderness bilaterally  Chaperone present:  No inguinal lymphadenopathy or inguinal hernia. Normal external genitalia. On speculum insertion minimal discomfort noted. Cervical os is  visualized and closed. Left adnexal tenderness to palpation without mass. No cervical motion tenderness.   Neurological: She is alert.  Skin: No rash noted.  Psychiatric: She has a normal mood and affect.  Nursing note and vitals reviewed.   ED Course  Procedures (including critical care time)  Patient presents with dysuria. Will obtain UA and pregnancy test.  11:16 AM Pregnancy test is positive. UA  shows evidence of UTI. Symptoms consistence with pyelonephritis. Rocephin given.    2:16 PM Evidence of bacterial vaginosis, will treat with metronidazole cream. Transvaginal ultrasound demonstrates a single live intrauterine gestation with an estimated gestational age of [redacted] weeks. Probable hemorrhagic corpus in the right ovary. Debris in the maternal urinary bladder, question a degree of cystitis. At this time patient is afebrile with stable normal vital sign and able to tolerates by mouth. She will be discharged with Keflex, and metronidazole cream. She will follow-up with [facility] for further management of her pregnancy. Return precautions discussed.  Care discussed with Dr. Rubin Payor.    Labs Review Labs Reviewed  WET PREP, GENITAL - Abnormal; Notable for the following:    Clue Cells Wet Prep HPF POC TOO NUMEROUS TO COUNT (*)    WBC, Wet Prep HPF POC FEW (*)    All other components within normal limits  URINALYSIS, ROUTINE W REFLEX MICROSCOPIC - Abnormal; Notable for the following:    Hgb urine dipstick MODERATE (*)    Ketones, ur 15 (*)    Leukocytes, UA SMALL (*)    All other components within normal limits  HCG, QUANTITATIVE, PREGNANCY - Abnormal; Notable for the following:    hCG, Beta Chain, Quant, [place] [address] (*)    All other components within normal limits  URINE MICROSCOPIC-ADD ON - Abnormal; Notable for the following:    Squamous Epithelial / LPF MANY (*)    Bacteria, UA FEW (*)    All other components within normal limits  URINE CULTURE  RPR  HIV ANTIBODY (ROUTINE TESTING)  PREGNANCY, URINE  POC URINE PREG, ED  ABO/RH  GC/CHLAMYDIA PROBE AMP ([place])    Imaging Review US Ob Comp Less 14 Wks  10/30/2014   CLINICAL DATA:  Lower abdominal/pelvic pain  EXAM: OBSTETRIC <14 WK [place] AND TRANSVAGINAL OB US  TECHNIQUE: Both transabdominal and transvaginal ultrasound examinations were performed for complete evaluation of the gestation as well as the maternal uterus, adnexal  regions, and pelvic cul-de-sac. Transvaginal technique was performed to assess early pregnancy.  COMPARISON:  None.  FINDINGS: Intrauterine gestational sac: Visualized/normal in shape.  Yolk sac:  Visualized  Embryo:  Visualized  Cardiac Activity: Visualized  Heart Rate: 157  bpm  CRL: 10 mm 7 w 1 d [place] EDC: June 17, 2015  Maternal uterus/adnexae: There is no demonstrable subchorionic hemorrhage. Cervical os is closed. There is a complex partially cystic structure in the right ovary measuring 1.3 x 1.2 x 1.6 cm. There is no other extrauterine pelvic or adnexal mass. Debris is noted in the maternal urinary bladder. No free fluid seen.  IMPRESSION: Single live intrauterine gestation with estimated gestational age of [redacted] weeks. Probable hemorrhagic corpus luteum in the right ovary. Debris in the maternal urinary bladder. Question a degree of cystitis.   Electronically Signed   By: Bretta Bang M.D.   On: 10/30/2014 13:57   US Ob Transvaginal  10/30/2014   CLINICAL DATA:  Lower abdominal/pelvic pain  EXAM: OBSTETRIC <14 WK [place] AND TRANSVAGINAL OB US  TECHNIQUE: Both transabdominal and transvaginal  ultrasound examinations were performed for complete evaluation of the gestation as well as the maternal uterus, adnexal regions, and pelvic cul-de-sac. Transvaginal technique was performed to assess early pregnancy.  COMPARISON:  None.  FINDINGS: Intrauterine gestational sac: Visualized/normal in shape.  Yolk sac:  Visualized  Embryo:  Visualized  Cardiac Activity: Visualized  Heart Rate: 157  bpm  CRL: 10 mm 7 w 1 d US EDC: June 17, 2015  Maternal uterus/adnexae: There is no demonstrable subchorionic hemorrhage. Cervical os is closed. There is a complex partially cystic structure in the right ovary measuring 1.3 x 1.2 x 1.6 cm. There is no other extrauterine pelvic or adnexal mass. Debris is noted in the maternal urinary bladder. No free fluid seen.  IMPRESSION: Single live intrauterine gestation with estimated  gestational age of [redacted] weeks. Probable hemorrhagic corpus luteum in the right ovary. Debris in the maternal urinary bladder. Question a degree of cystitis.   Electronically Signed   By: Bretta BangWilliam  Woodruff M.D.   On: 10/30/2014 13:57     EKG Interpretation None      MDM   Final diagnoses:  Pelvic pain during pregnancy  Pyelonephritis during pregnancy  BV (bacterial vaginosis)    BP 124/75 mmHg  Pulse 86  Temp(Src) 98.4 F (36.9 C) (Oral)  Resp 20  Ht 5\' 4"  (1.626 m)  Wt 170 lb (77.111 kg)  BMI 29.17 kg/m2  SpO2 100%  LMP 09/29/2014  I have reviewed nursing notes and vital signs. I personally reviewed the imaging tests through PACS system  I reviewed available ER/hospitalization records thought the EMR     Fayrene HelperBowie Daviana Haymaker, PA-C 10/30/14 1425  Nathan R. Rubin PayorPickering, MD 10/30/14 1700

## 2014-10-30 NOTE — Discharge Instructions (Signed)
You are [redacted] weeks pregnant.  You also have a bladder infection along with a bacterial vaginosis infection.  Please take antibiotic as prescribed for the full duration.  Take tylenol only for pain and [facility].  Return if your condition worsen.    Pregnancy and Urinary Tract Infection A urinary tract infection (UTI) is a bacterial infection of the urinary tract. Infection of the urinary tract can include the ureters, kidneys (pyelonephritis), bladder (cystitis), and urethra (urethritis). All pregnant women should be screened for bacteria in the urinary tract. Identifying and treating a UTI will decrease the risk of preterm labor and developing more serious infections in both the mother and baby. CAUSES Bacteria germs cause almost all UTIs.  RISK FACTORS Many factors can increase your chances of getting a UTI during pregnancy. These include:  Having a short urethra.  Poor toilet and hygiene habits.  Sexual intercourse.  Blockage of urine along the urinary tract.  Problems with the pelvic muscles or nerves.  Diabetes.  Obesity.  Bladder problems after having several children.  Previous history of UTI. SIGNS AND SYMPTOMS   Pain, burning, or a stinging feeling when urinating.  Suddenly feeling the need to urinate right away (urgency).  Loss of bladder control (urinary incontinence).  Frequent urination, more than is common with pregnancy.  Lower abdominal or back discomfort.  Cloudy urine.  Blood in the urine (hematuria).  Fever. When the kidneys are infected, the symptoms may be:  Back pain.  Flank pain on the right side more so than the left.  Fever.  Chills.  Nausea.  Vomiting. DIAGNOSIS  A urinary tract infection is usually diagnosed through urine tests. Additional tests and procedures are sometimes done. These may include:  Ultrasound exam of the kidneys, ureters, bladder, and  urethra.  Looking in the bladder with a lighted tube (cystoscopy). TREATMENT Typically, UTIs can be treated with antibiotic medicines.  HOME CARE INSTRUCTIONS   Only take over-the-counter or prescription medicines as directed by your health care provider. If you were prescribed antibiotics, take them as directed. Finish them even if you start to feel better.  Drink enough fluids to keep your urine clear or pale yellow.  Do not have sexual intercourse until the infection is gone and your health care provider says it is okay.  Make sure you are tested for UTIs throughout your pregnancy. These infections often come back. Preventing a UTI in the Future  Practice good toilet habits. Always wipe from front to back. Use the tissue only once.  Do not hold your urine. Empty your bladder as soon as possible when the urge comes.  Do not douche or use deodorant sprays.  Wash with soap and warm water around the genital area and the anus.  Empty your bladder before and after sexual intercourse.  Wear underwear with a cotton crotch.  Avoid caffeine and carbonated drinks. They can irritate the bladder.  Drink cranberry juice or take cranberry pills. This may decrease the risk of getting a UTI.  Do not drink alcohol.  Keep all your appointments and tests as scheduled. SEEK MEDICAL CARE IF:   Your symptoms get worse.  You are still having fevers 2 or more days after treatment begins.  You have a rash.  You feel that you are having problems with medicines prescribed.  You have abnormal vaginal discharge. SEEK IMMEDIATE MEDICAL CARE IF:   You have back or flank pain.  You have  chills.  You have blood in your urine.  You have nausea and vomiting.  You have contractions of your uterus.  You have a gush of fluid from the vagina. MAKE SURE YOU:  Understand these instructions.   Will watch your condition.   Will get help right away if you are not doing well or get worse.   Document Released: 01/03/2011 Document Revised: 06/29/2013 Document Reviewed: 04/07/2013 [facility] Patient Information 2015 [place], [place]. This information is not intended to replace advice given to you by your health care provider. Make sure you discuss any questions you have with your health care provider.  Bacterial Vaginosis Bacterial vaginosis is an infection of the vagina. It happens when too many of certain germs (bacteria) grow in the vagina. HOME CARE  Take your medicine as told by your doctor.  Finish your medicine even if you start to feel better.  Do not have sex until you finish your medicine and are better.  Tell your sex partner that you have an infection. They should see their doctor for treatment.  Practice safe sex. Use condoms. Have only one sex partner. GET HELP IF:  You are not getting better after 3 days of treatment.  You have more grey fluid (discharge) coming from your vagina than before.  You have more pain than before.  You have a fever. MAKE SURE YOU:   Understand these instructions.  Will watch your condition.  Will get help right away if you are not doing well or get worse. Document Released: 06/17/2008 Document Revised: 06/29/2013 Document Reviewed: 04/20/2013 [facility] Patient Information 2015 [place], [place]. This information is not intended to replace advice given to you by your health care provider. Make sure you discuss any questions you have with your health care provider.

## 2014-10-30 NOTE — ED Notes (Signed)
Ultrasound aware pt is now ready.

## 2014-10-30 NOTE — ED Notes (Signed)
Patient ambulated to restroom and tolerated well.  

## 2014-10-31 LAB — GC/CHLAMYDIA PROBE AMP (~~LOC~~) NOT AT ARMC
CHLAMYDIA, DNA PROBE: NEGATIVE
NEISSERIA GONORRHEA: NEGATIVE

## 2014-10-31 LAB — HIV ANTIBODY (ROUTINE TESTING W REFLEX): HIV Screen 4th Generation wRfx: NONREACTIVE

## 2014-11-01 LAB — RPR: RPR: NONREACTIVE

## 2014-11-02 ENCOUNTER — Telehealth (HOSPITAL_BASED_OUTPATIENT_CLINIC_OR_DEPARTMENT_OTHER): Payer: Self-pay | Admitting: Emergency Medicine

## 2014-11-02 LAB — URINE CULTURE

## 2014-11-02 NOTE — Telephone Encounter (Signed)
Post ED Visit - Positive Culture Follow-up  Culture report reviewed by antimicrobial stewardship pharmacist: []  Wes Dulaney, Pharm.D., BCPS []  Celedonio MiyamotoJeremy Frens, Pharm.D., BCPS [x]  Georgina PillionElizabeth Martin, [address] BCPS []  ColumbineMinh Pham, [address] BCPS, AAHIVP []  Estella HuskMichelle Turner, Pharm.D., BCPS, AAHIVP []  Elder CyphersLorie Poole, [address] BCPS  Positive urine culture E. coli Treated with cephalexin, organism sensitive to the same and no further patient follow-up is required at this time.  Berle MullMiller, Drusilla Wampole 11/02/2014, 1:02 PM

## 2014-11-03 ENCOUNTER — Telehealth (HOSPITAL_COMMUNITY): Payer: Self-pay

## 2014-11-03 NOTE — Telephone Encounter (Signed)
Post ED Visit - Positive Culture Follow-up  Culture report reviewed by antimicrobial stewardship pharmacist: []  Lisa Vazquez, Pharm.D., BCPS []  Lisa Vazquez, [address] BCPS [x]  Lisa Vazquez, Pharm.D., BCPS []  H[place]Minh Vazquez, [place]arm[place]D., BCPS, AAHIVP []  Lisa Vazquez, Pharm.D., BCPS, AAHIVP []  Lisa Vazquez, [address] BCPS  Positive Urine culture, >/= 100,000 colonies -> E Coli Treated with Cephalexin, organism sensitive to the same and no further patient follow-up is required at this time.  Arvid RightClark, Lisa Vazquez 11/03/2014, 11:36 AM

## 2015-03-23 ENCOUNTER — Emergency Department (HOSPITAL_COMMUNITY)
Admission: EM | Admit: 2015-03-23 | Discharge: 2015-03-24 | Disposition: A | Discharge status: Home / Self Care | Payer: Self-pay | Attending: Emergency Medicine | Admitting: Emergency Medicine

## 2015-03-23 ENCOUNTER — Encounter (HOSPITAL_COMMUNITY): Payer: Self-pay | Admitting: Emergency Medicine

## 2015-03-23 DIAGNOSIS — Z720 Tobacco use: Secondary | ICD-10-CM | POA: Insufficient documentation

## 2015-03-23 DIAGNOSIS — Z87440 Personal history of urinary (tract) infections: Secondary | ICD-10-CM | POA: Insufficient documentation

## 2015-03-23 DIAGNOSIS — E860 Dehydration: Secondary | ICD-10-CM | POA: Insufficient documentation

## 2015-03-23 DIAGNOSIS — M791 Myalgia, unspecified site: Secondary | ICD-10-CM

## 2015-03-23 DIAGNOSIS — Z8742 Personal history of other diseases of the female genital tract: Secondary | ICD-10-CM | POA: Insufficient documentation

## 2015-03-23 DIAGNOSIS — R1030 Lower abdominal pain, unspecified: Secondary | ICD-10-CM | POA: Insufficient documentation

## 2015-03-23 DIAGNOSIS — R197 Diarrhea, unspecified: Secondary | ICD-10-CM | POA: Insufficient documentation

## 2015-03-23 DIAGNOSIS — R51 Headache: Secondary | ICD-10-CM | POA: Insufficient documentation

## 2015-03-23 DIAGNOSIS — Z87448 Personal history of other diseases of urinary system: Secondary | ICD-10-CM | POA: Insufficient documentation

## 2015-03-23 DIAGNOSIS — Z792 Long term (current) use of antibiotics: Secondary | ICD-10-CM | POA: Insufficient documentation

## 2015-03-23 DIAGNOSIS — R112 Nausea with vomiting, unspecified: Secondary | ICD-10-CM | POA: Insufficient documentation

## 2015-03-23 DIAGNOSIS — Z3202 Encounter for pregnancy test, result negative: Secondary | ICD-10-CM | POA: Insufficient documentation

## 2015-03-23 DIAGNOSIS — R519 Headache, unspecified: Secondary | ICD-10-CM

## 2015-03-23 DIAGNOSIS — I10 Essential (primary) hypertension: Secondary | ICD-10-CM | POA: Insufficient documentation

## 2015-03-23 LAB — CBC WITH DIFFERENTIAL/PLATELET
BASOS ABS: 0 10*3/uL (ref 0.0–0.1)
BASOS PCT: 1 % (ref 0–1)
Eosinophils Absolute: 0.2 10*3/uL (ref 0.0–0.7)
Eosinophils Relative: 2 % (ref 0–5)
HEMATOCRIT: 38.5 % (ref 36.0–46.0)
Hemoglobin: 12.7 g/dL (ref 12.0–15.0)
LYMPHS PCT: 45 % (ref 12–46)
Lymphs Abs: 3 10*3/uL (ref 0.7–4.0)
MCH: 26 pg (ref 26.0–34.0)
MCHC: 33 g/dL (ref 30.0–36.0)
MCV: 78.7 fL (ref 78.0–100.0)
MONOS PCT: 8 % (ref 3–12)
Monocytes Absolute: 0.5 10*3/uL (ref 0.1–1.0)
NEUTROS PCT: 44 % (ref 43–77)
Neutro Abs: 2.9 10*3/uL (ref 1.7–7.7)
PLATELETS: 293 10*3/uL (ref 150–400)
RBC: 4.89 MIL/uL (ref 3.87–5.11)
RDW: 15.3 % (ref 11.5–15.5)
WBC: 6.5 10*3/uL (ref 4.0–10.5)

## 2015-03-23 LAB — COMPREHENSIVE METABOLIC PANEL
ALT: 12 U/L — ABNORMAL LOW (ref 14–54)
AST: 22 U/L (ref 15–41)
Albumin: 3.8 g/dL (ref 3.5–5.0)
Alkaline Phosphatase: 50 U/L (ref 38–126)
Anion gap: 6 (ref 5–15)
BILIRUBIN TOTAL: 0.3 mg/dL (ref 0.3–1.2)
BUN: 11 mg/dL (ref 6–20)
CHLORIDE: 107 mmol/L (ref 101–111)
CO2: 27 mmol/L (ref 22–32)
CREATININE: 1.07 mg/dL — AB (ref 0.44–1.00)
Calcium: 9.4 mg/dL (ref 8.9–10.3)
GFR calc Af Amer: >60 mL/min (ref >60)
GFR calc non Af Amer: >60 mL/min (ref >60)
Glucose, Bld: 117 mg/dL — ABNORMAL HIGH (ref 65–99)
POTASSIUM: 3.8 mmol/L (ref 3.5–5.1)
Sodium: 140 mmol/L (ref 135–145)
Total Protein: 6.9 g/dL (ref 6.5–8.1)

## 2015-03-23 LAB — POC URINE PREG, ED: Preg Test, Ur: NEGATIVE

## 2015-03-23 LAB — URINE MICROSCOPIC-ADD ON

## 2015-03-23 LAB — URINALYSIS, ROUTINE W REFLEX MICROSCOPIC
Bilirubin Urine: NEGATIVE
Glucose, UA: NEGATIVE mg/dL
Ketones, ur: NEGATIVE mg/dL
Leukocytes, UA: NEGATIVE
Nitrite: NEGATIVE
Protein, ur: NEGATIVE mg/dL
Specific Gravity, Urine: 1.021 (ref 1.005–1.030)
Urobilinogen, UA: 0.2 mg/dL (ref 0.0–1.0)
pH: 8 (ref 5.0–8.0)

## 2015-03-23 LAB — LIPASE, BLOOD: Lipase: 25 U/L (ref 22–51)

## 2015-03-23 LAB — I-STAT CG4 LACTIC ACID, ED: LACTIC ACID, VENOUS: 0.61 mmol/L (ref 0.5–2.0)

## 2015-03-23 MED ORDER — FAMOTIDINE IN NACL 20-0.9 MG/50ML-% IV SOLN
20.0000 mg | Freq: Once | INTRAVENOUS | Status: AC
  Administered 2015-03-23: 20 mg via INTRAVENOUS
  Filled 2015-03-23: qty 50

## 2015-03-23 MED ORDER — HYDROCODONE-ACETAMINOPHEN 5-325 MG PO TABS
1.0000 | ORAL_TABLET | Freq: Once | ORAL | Status: AC
  Administered 2015-03-23: 1 via ORAL
  Filled 2015-03-23: qty 1

## 2015-03-23 MED ORDER — ONDANSETRON HCL 4 MG PO TABS: 4.0000 mg | ORAL_TABLET | Freq: Three times a day (TID) | ORAL | Status: DC | PRN

## 2015-03-23 MED ORDER — ONDANSETRON HCL 4 MG/2ML IJ SOLN
4.0000 mg | Freq: Once | INTRAMUSCULAR | Status: AC
  Administered 2015-03-23: 4 mg via INTRAVENOUS
  Filled 2015-03-23: qty 2

## 2015-03-23 MED ORDER — SODIUM CHLORIDE 0.9 % IV BOLUS (SEPSIS)
1000.0000 mL | Freq: Once | INTRAVENOUS | Status: AC
  Administered 2015-03-23: 1000 mL via INTRAVENOUS

## 2015-03-23 MED ORDER — LOPERAMIDE HCL 2 MG PO CAPS: 2.0000 mg | ORAL_CAPSULE | Freq: Four times a day (QID) | ORAL | Status: DC | PRN

## 2015-03-23 MED ORDER — MORPHINE SULFATE 4 MG/ML IJ SOLN
4.0000 mg | Freq: Once | INTRAMUSCULAR | Status: AC
  Administered 2015-03-23: 4 mg via INTRAVENOUS
  Filled 2015-03-23: qty 1

## 2015-03-23 MED ORDER — HYDROCODONE-HOMATROPINE 5-1.5 MG/5ML PO SYRP: 5.0000 mL | ORAL_SOLUTION | Freq: Four times a day (QID) | ORAL | Status: DC | PRN

## 2015-03-23 NOTE — Discharge Instructions (Signed)
Return to the emergency room for any worsening or concerning symptoms including fast breathing, heart racing, confusion, vomiting.  Rest  and wash your hands frequently.   Push fluids: water or Gatorade, do not drink any soda, juice or caffeinated beverages.  For fever and pain control you can take Motrin (ibuprofen) as follows: 400 mg (this is normally 2 over the counter pills) every 4 hours with food.  Do not return to work until a day after your fever breaks.   For cough and pain control you may take Hycodan:  5 mL every 6 hours as needed. Do not drink, drive or operate heavy machinery when you're taking Hycodan as it will slow your reaction time.  Do not hesitate to return to the emergency room for any new, worsening or concerning symptoms.  Please obtain primary care using resource guide below. Let them know that you were seen in the emergency room and that they will need to obtain records for further outpatient management.    Emergency Department Resource Guide 1) Find a Doctor and Pay Out of Pocket Although you won't have to find out who is covered by your insurance plan, it is a good idea to ask around and get recommendations. You will then need to call the office and see if the doctor you have chosen will accept you as a new patient and what types of options they offer for patients who are self-pay. Some doctors offer discounts or will set up payment plans for their patients who do not have insurance, but you will need to ask so you aren't surprised when you get to your appointment.  2) Contact Your Local Health Department Not all health departments have doctors that can see patients for sick visits, but many do, so it is worth a call to see if yours does. If you don't know where your local health department is, you can check in your phone book. The CDC also has a tool to help you locate your state's health department, and many state websites also have listings of all of their local  health departments.  3) Find a Walk-in Clinic If your illness is not likely to be very severe or complicated, you may want to try a walk in clinic. These are popping up all over the country in pharmacies, drugstores, and shopping centers. They're usually staffed by nurse practitioners or physician assistants that have been trained to treat common illnesses and complaints. They're usually fairly quick and inexpensive. However, if you have serious medical issues or chronic medical problems, these are probably not your best option.  No Primary Care Doctor: - Call Health Connect at  [phone] - they can help you locate a primary care doctor that  accepts your insurance, provides certain services, etc. - Physician Referral Service- [phone]  Chronic Pain Problems: Organization         Address  Phone   Notes  [facility]  [phone] Patients need to be referred by their primary care doctor.   Medication Assistance: Organization         Address  Phone   Notes  [facility] Medication [facility] [address] [place], [place] [phone] --Must be a resident of [facility] [place] -- Must have NO insurance coverage whatsoever (no Medicaid/ Medicare, etc.) -- The pt. MUST have a primary care doctor that directs their care regularly and follows them in the community   MedAssist  [phone]) [phone]  [employer]  [phone]    Agencies that provide inexpensive medical care: Organization         Address  Phone   Notes  [facility]  [phone]   [facility]    [phone]   [facility] [address] [place], [place] [phone]   [facility] [address] [place] [place] [phone]   Planned Parenthood    [phone]   [facility]    [phone]   [facility]  [address], [place] Phone:   [phone], Fax:  [phone] Hours of Operation:  9 am - 6 pm, M-F.  Also accepts Medicaid/Medicare and self-pay.  [facility]  [address], [place] Phone: [phone], Fax: [phone]. Hours of Operation:  8:30 am - 5:30 pm, M-F.  Also accepts Medicaid and self-pay.  [facility] [place] [address], [place] Phone: [phone]   [facility] [address] Natasha Bence [place], [place] [phone], Ext. [phone] Mondays & Thursdays: 7-9 AM.  First 15 patients are seen on a first come, first serve basis.    Medicaid-accepting [facility] Providers:  Organization         Address  Phone   Notes  [facility] [address], [place] [phone] Also accepts self-pay patients.  [facility] [address] Laurell Josephs [place], [place]  [phone]   [facility] [address], [place] [phone]   [facility] [address], [place] [phone]   Renaye Rakers [address], [place]   [phone] Only accepts [place] Access [place] patients after they have their name applied to their card.   Self-Pay (no insurance) in [facility]:  Organization         Address  Phone   Notes  Sickle Cell Patients, [facility] [address] [place], [place] [phone]   [facility] [address] [place], [place] [phone]   [facility] [place]  [address], [place] [phone]   [facility]/Dr. Osei-Bonsu  [address], [place] or [address], [place] [phone] Phone number for both [place] and [place] locations is the same.  [facility] [address] [place] [phone]   [facility] [address], [place] or [address] [phone]   [facility] [address], [place] [phone], phone; [phone], fax Sees patients 1st and 3rd Saturday of every month.  Must not qualify for public or private insurance (i.e. Medicaid, Medicare, [place] Health Choice, Veterans' Benefits)  Household income should be no more than 200% of the poverty level The clinic cannot treat you if you are pregnant or think you are pregnant  Sexually transmitted diseases are not treated at the clinic.    Dental Care: Organization         Address  Phone  Notes  [facility] Department of [facility] [address] [place], [place] [phone] Accepts children up to age 33 who are enrolled in [place] or [place] Health Choice; pregnant women with a Medicaid card;  and children who have applied for Medicaid or [place] Health Choice, but were declined, whose parents can pay a reduced fee at time of service.  [facility] of [facility]  [address], [place] [phone] Accepts children up to age 81 who are enrolled in [place] or [place]; pregnant women with a Medicaid card; and children who have applied for Medicaid or [place] Health Choice, but were declined, whose parents can pay a reduced fee at time of service.  [facility]  [place] [phone] Patients are seen by appointment only. Walk-ins are not accepted. [place] will see patients 110 years of age and older. Monday - Tuesday (8am-5pm) Most Wednesdays (8:30-5pm) $30 per visit, cash only  [facility] Adult Dental Access PROGRAM  [address], [facility] [phone] Patients are seen by appointment only. Walk-ins are not accepted. [place] will see patients 26 years of age and older. One Wednesday Evening (Monthly: Volunteer Based).  $30 per visit, cash only  [place]  [phone] for adults;  Children under age 61, call Graduate Pediatric Dentistry at [phone]. Children aged 36-14, please call [phone] to request a pediatric application.  Dental services are provided in all areas of dental care including fillings, crowns and bridges, complete and partial dentures, implants, gum treatment, root canals, and extractions. Preventive care is also provided. Treatment is provided to both adults and children. Patients are selected via a lottery and there is often a waiting list.   [facility] [address] [place]  [phone] [email or web address]   [facility] [address] [place], [place] [phone], Ext. [phone] Second and Fourth Thursday of each month, opens at 6:30 AM; Clinic ends at 9 AM.  Patients are seen on a first-come first-served basis, and a limited number are seen during each clinic.   [facility]  [address] Hillard Danker [place], [place] [phone]   Eligibility Requirements You must have lived in [place], [place], or [place] counties for at least the last three months.   You cannot be eligible for state or federal sponsored [employer], including [employer], [place], or [employer].   You generally cannot be eligible for healthcare insurance through your employer.    How to apply: Eligibility screenings are held every Tuesday and Wednesday afternoon from 1:00 pm until 4:00 pm. You do not need an appointment for the interview!  [facility] [address] [place], [place]   [place]  [place] Department  [place]  [phone]    Behavioral Health Resources in the Community: Intensive Outpatient Programs Organization         Address  Phone  Notes  [place]. [address], [place], [place] [phone]   [facility] Outpatient [address], [place], [place]   ADS: [facility] [address] [place], [place]   [place] [address]  [place], [place] or [phone]   Substance Abuse Resources Organization         Address  Phone  Notes  Alcohol and Drug Services  [place]  [phone]   [facility] [place]   [facility]  [phone]  Residential & Outpatient Substance Abuse Program  [phone]   Psychological Services Organization         Address  Phone  Notes  [facility] Behavioral Health  [phone]   [facility] Services  [phone]   [facility] [phone] [address] [place] [phone] or [phone]    Mobile Crisis Teams Organization         Address  Phone  Notes  [facility], Mobile Crisis Care Unit  [phone]   [facility]  [address] [place], [place] [phone]   Doristine Locks [address] [place] [phone]    Self-Help/Support Groups Organization         Address  Phone             Notes  [facility] [place] - variety of support groups  [phone]- [phone] Call for more information  Narcotics Anonymous (NA), [facility] [address], [employer] [place]  2 meetings at this location   Statistician         Address  Phone  Notes  [facility] [address],    [place]  [phone]   [facility]  [address] [place] [address], [place], [place] [phone]   [facility] [address] [place], [place] [phone] Admissions: 8am-3pm M-F  [facility] [address],    [place], [place] [phone]   [facility] [address] [place], [place], [place] [phone]   [facility] [address]  [place], [place] [phone]   [facility] - Intensive  Outpatient [address] Laurell Josephs [address], [place], [place] [phone]   [facility] (Addiction Recovery [facility] [address] [place]  [place], [place] [phone] or [phone]   [facility] ([facility]) [address] [place], [place] [phone] Accepts Medicaid  [facility] [place] [address],  [place] [phone] Substance Abuse/Addiction Treatment   [facility] Organization         Address  Phone  Notes  [facility]  [phone]   Angie Fava, PhD [address] Ervin Knack [place], [place]   [phone] or [phone]   [facility]   [address] [place], [place] [phone]   [facility] [address], [place], [place] [phone] Insurance/Medicaid/sponsorship through [facility] [address]                                    [place], [place] [phone] Therapy/tele-psych/case  [facility] [address], [place] [phone]    Dr. Lolly Mustache  [phone]   [facility]  [employer] [facility] Dept. 1) [address]  2) [address] [place] 3)  [address], [place] [phone]  [phone]   [facility] Child Abuse Hotline 939-738-6735 or [phone])[phone] (After Hours)           Dehydration, Adult Dehydration is when you lose more fluids from the body than you take in. Vital organs like the kidneys, brain, and heart cannot function without a proper amount of fluids and salt. Any loss of fluids from the body can cause dehydration.  CAUSES   Vomiting.  Diarrhea.  Excessive  sweating.  Excessive urine output.  Fever. SYMPTOMS  Mild dehydration  Thirst.  Dry lips.  Slightly dry mouth. Moderate dehydration  Very dry mouth.  Sunken eyes.  Skin does not bounce back quickly when lightly pinched and released.  Dark urine and decreased urine  production.  Decreased tear production.  Headache. Severe dehydration  Very dry mouth.  Extreme thirst.  Rapid, weak pulse (more than 100 beats per minute at rest).  Cold hands and feet.  Not able to sweat in spite of heat and temperature.  Rapid breathing.  Blue lips.  Confusion and lethargy.  Difficulty being awakened.  Minimal urine production.  No tears. DIAGNOSIS  Your caregiver will diagnose dehydration based on your symptoms and your exam. Blood and urine tests will help confirm the diagnosis. The diagnostic evaluation should also identify the cause of dehydration. TREATMENT  Treatment of mild or moderate dehydration can often be done at home by increasing the amount of fluids that you drink. It is best to drink small amounts of fluid more often. Drinking too much at one time can make vomiting worse. Refer to the home care instructions below. Severe dehydration needs to be treated at the hospital where you will probably be given intravenous (IV) fluids that contain water and electrolytes. HOME CARE INSTRUCTIONS   Ask your caregiver about specific rehydration instructions.  Drink enough fluids to keep your urine clear or pale yellow.  Drink small amounts frequently if you have nausea and vomiting.  Eat as you normally do.  Avoid:  Foods or drinks high in sugar.  Carbonated drinks.  Juice.  Extremely hot or cold fluids.  Drinks with caffeine.  Fatty, greasy foods.  Alcohol.  Tobacco.  Overeating.  Gelatin desserts.  Wash your hands well to avoid spreading bacteria and viruses.  Only take over-the-counter or prescription medicines for pain, discomfort, or fever as directed by your caregiver.  Ask your caregiver if you should continue all prescribed and over-the-counter medicines.  Keep all follow-up appointments with your caregiver. SEEK MEDICAL CARE IF:  You have abdominal pain and it increases or stays in one area (localizes).  You  have a rash, stiff neck, or severe headache.  You are irritable, sleepy, or difficult to awaken.  You are weak, dizzy, or extremely thirsty. SEEK IMMEDIATE MEDICAL CARE IF:   You are unable to keep fluids down or you get worse despite treatment.  You have frequent episodes of vomiting or diarrhea.  You have blood or green matter (bile) in your vomit.  You have blood in your stool or your stool looks black and tarry.  You have not urinated in 6 to 8 hours, or you have only urinated a small amount of very dark urine.  You have a fever.  You faint. MAKE SURE YOU:   Understand these instructions.  Will watch your condition.  Will get help right away if you are not doing well or get worse. Document Released: 09/08/2005 Document Revised: 12/01/2011 Document Reviewed: 04/28/2011 [facility] Patient Information 2015 [place], [place]. This information is not intended to replace advice given to you by your health care provider. Make sure you discuss any questions you have with your health care provider.

## 2015-03-23 NOTE — ED Notes (Signed)
Pt provided with water to drink

## 2015-03-23 NOTE — ED Provider Notes (Signed)
CSN: [id number]     Arrival date & time 03/23/15  2054 History   First MD Initiated Contact with Patient 03/23/15 2122     Chief Complaint  Patient presents with  . Chills  . Generalized Body Aches     (Consider location/radiation/quality/duration/timing/severity/associated sxs/prior Treatment) HPI    Blood pressure 160/106, pulse 72, temperature 97.8 F (36.6 C), temperature source Oral, resp. rate 18, last menstrual period 03/16/2015, SpO2 100 %.  Lisa Vazquez is a 29 y.o. female past medical history significant for hypertension complaining of severe global headache, vomiting 3 (nonbloody, nonbilious, no coffee-ground), chills, upper and lower abdominal pain, diffuse myalgia acute in onset approximately 3 PM this afternoon. Patient thinks that she's had a UTI for the last week, she denies dysuria, hematuria but she endorses a difficult to initiate urinary stream. She denies abnormal vaginal discharge, cough, cervicalgia, recent travel, tick bites., Sick contacts, however, patient works in a nursing home.  Past Medical History  Diagnosis Date  . UTI (lower urinary tract infection)   . BV (bacterial vaginosis)   . UTI (lower urinary tract infection)   . Hypertension   . Kidney infection    Past Surgical History  Procedure Laterality Date  . Cesarean section      x3   Family History  Problem Relation Age of Onset  . Hypertension Mother   . Heart disease Father   . Asthma Brother    History  Substance Use Topics  . Smoking status: Current Every Day Smoker -- 0.25 packs/day    Types: Cigarettes  . Smokeless tobacco: Current User  . Alcohol Use: No   OB History    Gravida Para Term Preterm AB TAB SAB Ectopic Multiple Living   4 3 1 2 1 1   0 0 3     Review of Systems    Allergies  Review of patient's allergies indicates no known allergies.  Home Medications   Prior to Admission medications   Medication Sig Start Date End Date Taking? Authorizing Provider   acetaminophen (TYLENOL) 500 MG tablet Take 1 tablet (500 mg total) by mouth every 6 (six) hours as needed. 10/30/14   Fayrene HelperBowie Tran, PA-C  azithromycin (ZITHROMAX Z-PAK) 250 MG tablet 2 po day one, then 1 daily x 4 days Patient not taking: Reported on 10/30/2014 07/08/14   Loren Raceravid Yelverton, MD  cephALEXin (KEFLEX) 500 MG capsule Take 1 capsule (500 mg total) by mouth 4 (four) times daily. 10/30/14   Fayrene HelperBowie Tran, PA-C  ibuprofen (ADVIL,MOTRIN) 800 MG tablet Take 1 tablet (800 mg total) by mouth 3 (three) times daily. 10/19/14   Roxy Horsemanobert Browning, PA-C  loratadine (CLARITIN) 10 MG tablet Take 1 tablet (10 mg total) by mouth daily. Patient not taking: Reported on 10/30/2014 07/08/14   Loren Raceravid Yelverton, MD  methocarbamol (ROBAXIN) 500 MG tablet Take 2 tablets (1,000 mg total) by mouth every 8 (eight) hours as needed for muscle spasms. Patient not taking: Reported on 10/30/2014 07/08/14   Loren Raceravid Yelverton, MD  oxyCODONE-acetaminophen (PERCOCET/ROXICET) 5-325 MG per tablet Take 1 tablet by mouth every 6 (six) hours as needed for severe pain (pain).    Historical Provider, MD  oxymetazoline (AFRIN NASAL SPRAY) 0.05 % nasal spray Place 1 spray into both nostrils 2 (two) times daily. Patient not taking: Reported on 10/30/2014 07/08/14   Loren Raceravid Yelverton, MD   BP 160/106 mmHg  Pulse 72  Temp(Src) 97.8 F (36.6 C) (Oral)  Resp 18  SpO2 100%  LMP 03/16/2015 Physical Exam  Constitutional: She is oriented to person, place, and time. She appears well-developed and well-nourished. No distress.  Appears uncomforatable  HENT:  Head: Normocephalic and atraumatic.  Mouth/Throat: Oropharynx is clear and moist.  Eyes: Conjunctivae and EOM are normal. Pupils are equal, round, and reactive to light.  No TTP of maxillary or frontal sinuses  No TTP or induration of temporal arteries bilaterally  Neck: Normal range of motion. Neck supple.  FROM to C-spine. Pt can touch chin to chest without discomfort. No TTP of midline cervical  spine.   Cardiovascular: Normal rate, regular rhythm and intact distal pulses.   Pulmonary/Chest: Effort normal and breath sounds normal. No respiratory distress. She has no wheezes. She has no rales. She exhibits no tenderness.  Abdominal: Soft. Bowel sounds are normal. She exhibits no distension and no mass. There is tenderness. There is no rebound and no guarding.  Mild tenderness to deep palpation under the xiphoid process and in the suprapubic area, no guarding or rebound.  Murphy sign negative, no tenderness to palpation over McBurney's point, Rovsings, Psoas and obturator all negative.   Musculoskeletal: Normal range of motion. She exhibits no edema or tenderness.  Neurological: She is alert and oriented to person, place, and time. No cranial nerve deficit.  II-Visual fields grossly intact. III/IV/VI-Extraocular movements intact.  Pupils reactive bilaterally. V/VII-Smile symmetric, equal eyebrow raise,  facial sensation intact VIII- Hearing grossly intact IX/X-Normal gag XI-bilateral shoulder shrug XII-midline tongue extension Motor: 5/5 bilaterally with normal tone and bulk Cerebellar: Normal finger-to-nose  and normal heel-to-shin test.   Romberg negative Ambulates with a coordinated gait   Skin: She is not diaphoretic.  Psychiatric: She has a normal mood and affect.  Nursing note and vitals reviewed.   ED Course  Procedures (including critical care time) Labs Review Labs Reviewed  COMPREHENSIVE METABOLIC PANEL - Abnormal; Notable for the following:    Glucose, Bld 117 (*)    Creatinine, Ser 1.07 (*)    ALT 12 (*)    All other components within normal limits  URINALYSIS, ROUTINE W REFLEX MICROSCOPIC (NOT AT [facility]) - Abnormal; Notable for the following:    APPearance TURBID (*)    Hgb urine dipstick MODERATE (*)    All other components within normal limits  URINE MICROSCOPIC-ADD ON - Abnormal; Notable for the following:    Squamous Epithelial / LPF FEW (*)     Bacteria, UA FEW (*)    All other components within normal limits  URINE CULTURE  CBC WITH DIFFERENTIAL/PLATELET  LIPASE, BLOOD  POC URINE PREG, ED  I-STAT CG4 LACTIC ACID, ED    Imaging Review No results found.   EKG Interpretation None      MDM   Final diagnoses:  Nausea vomiting and diarrhea  Dehydration  Nonintractable headache, unspecified chronicity pattern, unspecified headache type  Myalgia    Filed Vitals:   03/23/15 2100 03/23/15 2138  BP: 158/98 160/106  Pulse: 78 72  Temp: 97.8 F (36.6 C)   TempSrc: Oral   Resp: 16 18  SpO2: 99% 100%    Medications  sodium chloride 0.9 % bolus 1,000 mL (1,000 mLs Intravenous New Bag/Given 03/23/15 2234)  morphine 4 MG/ML injection 4 mg (4 mg Intravenous Given 03/23/15 2234)  ondansetron (ZOFRAN) injection 4 mg (4 mg Intravenous Given 03/23/15 2234)  famotidine (PEPCID) IVPB 20 mg premix (0 mg Intravenous Stopped 03/23/15 2315)  HYDROcodone-acetaminophen (NORCO/VICODIN) 5-325 MG per tablet 1 tablet (1 tablet Oral Given 03/23/15 2320)    Luiz Blare  List is a pleasant 29 y.o. female presenting with difficulty urinating, nausea, vomiting diarrhea, headache, diffuse myalgia and suprapubic and epigastric tenderness palpation. Normal lactic acid, no leukocytosis. Urinalysis with no signs of infection. She has a mild acute kidney injury with a creatinine of 1.07. Her headache is likely secondary to dehydration, she has no meningeal signs.  UA with moderate amount of hemoglobin think this is likely secondary to dehydration. Patient will be bolused in the ED. Encouraged aggressive outpatient hydration. 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   Evaluation does not show pathology that would require ongoing emergent intervention or inpatient treatment. Pt is hemodynamically stable and mentating appropriately. Discussed findings and plan with patient/guardian, who agrees with care plan. All questions answered. Return precautions  discussed and outpatient follow up given.   New Prescriptions   HYDROCODONE-HOMATROPINE (HYCODAN) 5-1.5 MG/5ML SYRUP    Take 5 mLs by mouth every 6 (six) hours as needed (Pain).   LOPERAMIDE (IMODIUM) 2 MG CAPSULE    Take 1 capsule (2 mg total) by mouth 4 (four) times daily as needed for diarrhea or loose stools.   ONDANSETRON (ZOFRAN) 4 MG TABLET    Take 1 tablet (4 mg total) by mouth every 8 (eight) hours as needed for nausea or vomiting.         Wynetta Emery, PA-C 03/24/15 [id number]  Blake Divine, MD 03/26/15 [phone]

## 2015-03-23 NOTE — ED Notes (Signed)
Verified with lab that they have pt's urine.  Slips sent to have Urinalysis processed.

## 2015-03-23 NOTE — ED Notes (Signed)
Pt. reports chills , emesis , low abd pain , generalized body aches onset this afternoon .

## 2015-03-27 LAB — URINE CULTURE: Culture: >=100000

## 2015-03-28 ENCOUNTER — Telehealth (HOSPITAL_BASED_OUTPATIENT_CLINIC_OR_DEPARTMENT_OTHER): Payer: Self-pay | Admitting: Emergency Medicine

## 2015-03-28 NOTE — Progress Notes (Signed)
ED Antimicrobial Stewardship Positive Culture Follow Up   Lisa IslamDominique M Vazquez is an 29 y.o. female who [facility] to [facility] with a chief complaint of  Chief Complaint  Patient presents with  . Chills  . Generalized Body Aches    Recent Results (from the past 720 hour(s))  Urine culture     Status: None   Collection Time: 03/23/15  9:04 PM  Result Value Ref Range Status   Specimen Description URINE, RANDOM  Final   Special Requests NONE  Final   Culture >=100,000 COLONIES/mL ESCHERICHIA COLI  Final   Report Status 03/27/2015 FINAL  Final   Organism ID, Bacteria ESCHERICHIA COLI  Final      Susceptibility   Escherichia coli - MIC*    AMPICILLIN >=32 RESISTANT Resistant     CEFAZOLIN 8 SENSITIVE Sensitive     CEFTRIAXONE <=1 SENSITIVE Sensitive     CIPROFLOXACIN <=0.25 SENSITIVE Sensitive     GENTAMICIN <=1 SENSITIVE Sensitive     IMIPENEM <=0.25 SENSITIVE Sensitive     NITROFURANTOIN <=16 SENSITIVE Sensitive     TRIMETH/SULFA <=20 SENSITIVE Sensitive     AMPICILLIN/SULBACTAM 16 INTERMEDIATE Intermediate     PIP/TAZO <=4 SENSITIVE Sensitive     * >=100,000 COLONIES/mL ESCHERICHIA COLI    [x]  Patient discharged originally without antimicrobial agent and treatment is now indicated  New antibiotic prescription: Cephalexin 500 mg BID x 1 week  ED Provider: Santiago GladHeather Laisure, PA-C  Lisa Vazquez 03/28/2015, 8:50 AM Infectious Diseases Pharmacist Phone# [phone]

## 2015-04-03 ENCOUNTER — Telehealth (HOSPITAL_COMMUNITY): Payer: Self-pay

## 2015-04-03 NOTE — ED Notes (Signed)
Pt returned call. Informed of labs. Per Santiago GladHeather Laisure PA Keflex 500mg  BID x 1 week was ordered and  called to [facility] [address]. Per pt request. Left on MD voicemail.

## 2015-04-04 ENCOUNTER — Encounter (HOSPITAL_COMMUNITY): Payer: Self-pay | Admitting: Emergency Medicine

## 2015-04-04 DIAGNOSIS — R109 Unspecified abdominal pain: Secondary | ICD-10-CM | POA: Insufficient documentation

## 2015-04-04 DIAGNOSIS — R102 Pelvic and perineal pain: Secondary | ICD-10-CM | POA: Insufficient documentation

## 2015-04-04 DIAGNOSIS — R110 Nausea: Secondary | ICD-10-CM | POA: Insufficient documentation

## 2015-04-04 DIAGNOSIS — Z720 Tobacco use: Secondary | ICD-10-CM | POA: Insufficient documentation

## 2015-04-04 DIAGNOSIS — I10 Essential (primary) hypertension: Secondary | ICD-10-CM | POA: Insufficient documentation

## 2015-04-04 DIAGNOSIS — R300 Dysuria: Secondary | ICD-10-CM | POA: Insufficient documentation

## 2015-04-04 NOTE — ED Notes (Signed)
C/o R flank pain and R sided pelvic pain since 7/2.  States she was seen on 7/2 for same and got a letter in the mail yesterday saying she had a UTI.  Also reports nausea and difficulty urinating.  Last urinated at 8am.

## 2015-04-05 ENCOUNTER — Emergency Department (HOSPITAL_COMMUNITY)
Admission: EM | Admit: 2015-04-05 | Discharge: 2015-04-05 | Disposition: A | Discharge status: Home / Self Care | Payer: Medicaid Other | Attending: Emergency Medicine | Admitting: Emergency Medicine

## 2015-04-05 ENCOUNTER — Encounter (HOSPITAL_COMMUNITY): Payer: Self-pay | Admitting: Neurology

## 2015-04-05 ENCOUNTER — Emergency Department (HOSPITAL_COMMUNITY)
Admission: EM | Admit: 2015-04-05 | Discharge: 2015-04-05 | Discharge status: Against Medical Advice | Payer: Medicaid Other | Attending: Emergency Medicine | Admitting: Emergency Medicine

## 2015-04-05 DIAGNOSIS — Z720 Tobacco use: Secondary | ICD-10-CM | POA: Insufficient documentation

## 2015-04-05 DIAGNOSIS — N12 Tubulo-interstitial nephritis, not specified as acute or chronic: Secondary | ICD-10-CM

## 2015-04-05 DIAGNOSIS — I10 Essential (primary) hypertension: Secondary | ICD-10-CM | POA: Insufficient documentation

## 2015-04-05 DIAGNOSIS — Z87440 Personal history of urinary (tract) infections: Secondary | ICD-10-CM | POA: Insufficient documentation

## 2015-04-05 DIAGNOSIS — Z8742 Personal history of other diseases of the female genital tract: Secondary | ICD-10-CM | POA: Insufficient documentation

## 2015-04-05 LAB — CBC
HEMATOCRIT: 36.7 % (ref 36.0–46.0)
Hemoglobin: 12.3 g/dL (ref 12.0–15.0)
MCH: 25.8 pg — ABNORMAL LOW (ref 26.0–34.0)
MCHC: 33.5 g/dL (ref 30.0–36.0)
MCV: 77.1 fL — ABNORMAL LOW (ref 78.0–100.0)
PLATELETS: 247 10*3/uL (ref 150–400)
RBC: 4.76 MIL/uL (ref 3.87–5.11)
RDW: 15.3 % (ref 11.5–15.5)
WBC: 6.8 10*3/uL (ref 4.0–10.5)

## 2015-04-05 LAB — BASIC METABOLIC PANEL
ANION GAP: 6 (ref 5–15)
BUN: 12 mg/dL (ref 6–20)
CO2: 26 mmol/L (ref 22–32)
Calcium: 8.9 mg/dL (ref 8.9–10.3)
Chloride: 103 mmol/L (ref 101–111)
Creatinine, Ser: 1.07 mg/dL — ABNORMAL HIGH (ref 0.44–1.00)
GFR calc Af Amer: >60 mL/min (ref >60)
Glucose, Bld: 125 mg/dL — ABNORMAL HIGH (ref 65–99)
Potassium: 3.6 mmol/L (ref 3.5–5.1)
Sodium: 135 mmol/L (ref 135–145)

## 2015-04-05 LAB — URINALYSIS, ROUTINE W REFLEX MICROSCOPIC
Bilirubin Urine: NEGATIVE
GLUCOSE, UA: NEGATIVE mg/dL
Hgb urine dipstick: NEGATIVE
Ketones, ur: NEGATIVE mg/dL
LEUKOCYTES UA: NEGATIVE
NITRITE: NEGATIVE
Protein, ur: NEGATIVE mg/dL
Specific Gravity, Urine: 1.019 (ref 1.005–1.030)
Urobilinogen, UA: 1 mg/dL (ref 0.0–1.0)
pH: 6 (ref 5.0–8.0)

## 2015-04-05 LAB — I-STAT BETA HCG BLOOD, ED (MC, WL, AP ONLY): I-stat hCG, quantitative: <5 m[IU]/mL (ref <5)

## 2015-04-05 MED ORDER — ONDANSETRON HCL 4 MG/2ML IJ SOLN
4.0000 mg | Freq: Once | INTRAMUSCULAR | Status: AC
  Administered 2015-04-05: 4 mg via INTRAVENOUS
  Filled 2015-04-05: qty 2

## 2015-04-05 MED ORDER — CEPHALEXIN 500 MG PO CAPS: 500.0000 mg | ORAL_CAPSULE | Freq: Four times a day (QID) | ORAL | Status: DC

## 2015-04-05 MED ORDER — OXYCODONE-ACETAMINOPHEN 5-325 MG PO TABS
1.0000 | ORAL_TABLET | ORAL | Status: DC | PRN
Start: 2015-04-05 — End: 2015-07-07

## 2015-04-05 MED ORDER — SODIUM CHLORIDE 0.9 % IV BOLUS (SEPSIS)
1000.0000 mL | Freq: Once | INTRAVENOUS | Status: AC
  Administered 2015-04-05: 1000 mL via INTRAVENOUS

## 2015-04-05 MED ORDER — DEXTROSE 5 % IV SOLN
1.0000 g | Freq: Once | INTRAVENOUS | Status: AC
  Administered 2015-04-05: 1 g via INTRAVENOUS
  Filled 2015-04-05: qty 10

## 2015-04-05 MED ORDER — HYDROMORPHONE HCL 1 MG/ML IJ SOLN
1.0000 mg | Freq: Once | INTRAMUSCULAR | Status: AC
  Administered 2015-04-05: 1 mg via INTRAVENOUS
  Filled 2015-04-05: qty 1

## 2015-04-05 NOTE — ED Notes (Signed)
Called patient's name several times to go back to the assigned room with no answer. Waited over 30 minutes in the event the patient had just walked outside prior to removing from the room.   

## 2015-04-05 NOTE — Discharge Instructions (Signed)
Pyelonephritis, Adult °Pyelonephritis is a kidney infection. In general, there are 2 main types of pyelonephritis: °· Infections that come on quickly without any warning (acute pyelonephritis). °· Infections that persist for a long period of time (chronic pyelonephritis). °CAUSES  °Two main causes of pyelonephritis are: °· Bacteria traveling from the bladder to the kidney. This is a problem especially in pregnant women. The urine in the bladder can become filled with bacteria from multiple causes, including: °¨ Inflammation of the prostate gland (prostatitis). °¨ Sexual intercourse in females. °¨ Bladder infection (cystitis). °· Bacteria traveling from the bloodstream to the tissue part of the kidney. °Problems that may increase your risk of getting a kidney infection include: °· Diabetes. °· Kidney stones or bladder stones. °· Cancer. °· Catheters placed in the bladder. °· Other abnormalities of the kidney or ureter. °SYMPTOMS  °· Abdominal pain. °· Pain in the side or flank area. °· Fever. °· Chills. °· Upset stomach. °· Blood in the urine (dark urine). °· Frequent urination. °· Strong or persistent urge to urinate. °· Burning or stinging when urinating. °DIAGNOSIS  °Your caregiver may diagnose your kidney infection based on your symptoms. A urine sample may also be taken. °TREATMENT  °In general, treatment depends on how severe the infection is.  °· If the infection is mild and caught early, your caregiver may treat you with oral antibiotics and send you home. °· If the infection is more severe, the bacteria may have gotten into the bloodstream. This will require intravenous (IV) antibiotics and a hospital stay. Symptoms may include: °¨ High fever. °¨ Severe flank pain. °¨ Shaking chills. °· Even after a hospital stay, your caregiver may require you to be on oral antibiotics for a period of time. °· Other treatments may be required depending upon the cause of the infection. °HOME CARE INSTRUCTIONS  °· Take your  antibiotics as directed. Finish them even if you start to feel better. °· Make an appointment to have your urine checked to make sure the infection is gone. °· Drink enough fluids to keep your urine clear or pale yellow. °· Take medicines for the bladder if you have urgency and frequency of urination as directed by your caregiver. °SEEK IMMEDIATE MEDICAL CARE IF:  °· You have a fever or persistent symptoms for more than 2-3 days. °· You have a fever and your symptoms suddenly get worse. °· You are unable to take your antibiotics or fluids. °· You develop shaking chills. °· You experience extreme weakness or fainting. °· There is no improvement after 2 days of treatment. °MAKE SURE YOU: °· Understand these instructions. °· Will watch your condition. °· Will get help right away if you are not doing well or get worse. °Document Released: 09/08/2005 Document Revised: 03/09/2012 Document Reviewed: 02/12/2011 °Exit[employer]® Patient Information ©2015 [employer]. This information is not intended to replace advice given to you by your health care provider. Make sure you discuss any questions you have with your health care provider. ° °

## 2015-04-05 NOTE — ED Notes (Addendum)
Pt c/o right lower abd and right flank pain, dx with UTI recently. Pt reports she was seen here last night for same but left due to wait time. Had blood drawn and urine sample.

## 2015-04-05 NOTE — ED Provider Notes (Signed)
CSN: [id number]     Arrival date & time 04/05/15  1914 History   First MD Initiated Contact with Patient 04/05/15 [phone]     Chief Complaint  Patient presents with  . Flank Pain     (Consider location/radiation/quality/duration/timing/severity/associated sxs/prior Treatment) HPI   29 year old female with abdominal and right flank pain. Symptoms have progressively worsened over the past couple weeks. Patient was evaluated in the emergency room on July 2. She had a urinalysis at that time which is fairly unremarkable. Urine culture did grow out a significant amount of Escherichia coli though. Patient did not start antibiotics which were called into the pharmacy. She presented to the emergency room yesterday but left prior to evaluation by provider. She did have repeat urinalysis and blood work. Mild nausea, no vomiting. No fevers or chills. No unusual vaginal bleeding or discharge.  Past Medical History  Diagnosis Date  . UTI (lower urinary tract infection)   . BV (bacterial vaginosis)   . UTI (lower urinary tract infection)   . Hypertension   . Kidney infection    Past Surgical History  Procedure Laterality Date  . Cesarean section      x3   Family History  Problem Relation Age of Onset  . Hypertension Mother   . Heart disease Father   . Asthma Brother    History  Substance Use Topics  . Smoking status: Current Every Day Smoker -- 0.25 packs/day    Types: Cigarettes  . Smokeless tobacco: Current User  . Alcohol Use: No   OB History    Gravida Para Term Preterm AB TAB SAB Ectopic Multiple Living   0 0 3     Review of Systems  All systems reviewed and negative, other than as noted in HPI.   Allergies  Review of patient's allergies indicates no known allergies.  Home Medications   Prior to Admission medications   Medication Sig Start Date End Date Taking? Authorizing Provider  acetaminophen (TYLENOL) 500 MG tablet Take 1 tablet (500 mg total) by mouth  every 6 (six) hours as needed. Patient not taking: Reported on 03/23/2015 10/30/14   Fayrene Helper, PA-C  azithromycin (ZITHROMAX Z-PAK) 250 MG tablet 2 po day one, then 1 daily x 4 days Patient not taking: Reported on 10/30/2014 07/08/14   Loren Racer, MD  cephALEXin (KEFLEX) 500 MG capsule Take 1 capsule (500 mg total) by mouth 4 (four) times daily. Patient not taking: Reported on 03/23/2015 10/30/14   Fayrene Helper, PA-C  HYDROcodone-homatropine [facility]) 5-1.5 MG/5ML syrup Take 5 mLs by mouth every 6 (six) hours as needed (Pain). 03/23/15   Nicole Pisciotta, PA-C  ibuprofen (ADVIL,MOTRIN) 800 MG tablet Take 1 tablet (800 mg total) by mouth 3 (three) times daily. Patient not taking: Reported on 03/23/2015 10/19/14   Roxy Horseman, PA-C  loperamide (IMODIUM) 2 MG capsule Take 1 capsule (2 mg total) by mouth 4 (four) times daily as needed for diarrhea or loose stools. 03/23/15   Nicole Pisciotta, PA-C  loratadine (CLARITIN) 10 MG tablet Take 1 tablet (10 mg total) by mouth daily. Patient not taking: Reported on 10/30/2014 07/08/14   Loren Racer, MD  methocarbamol (ROBAXIN) 500 MG tablet Take 2 tablets (1,000 mg total) by mouth every 8 (eight) hours as needed for muscle spasms. Patient not taking: Reported on 10/30/2014 07/08/14   Loren Racer, MD  ondansetron (ZOFRAN) 4 MG tablet Take 1 tablet (4 mg total) by mouth every 8 (eight) hours  as needed for nausea or vomiting. 03/23/15   Nicole Pisciotta, PA-C  oxymetazoline (AFRIN NASAL SPRAY) 0.05 % nasal spray Place 1 spray into both nostrils 2 (two) times daily. Patient not taking: Reported on 10/30/2014 07/08/14   Loren Raceravid Yelverton, MD   BP 151/105 mmHg  Pulse 94  Temp(Src) 97.8 F (36.6 C) (Oral)  Resp 17  Ht 5\' 4"  (1.626 m)  Wt 150 lb (68.04 kg)  BMI 25.73 kg/m2  SpO2 97%  LMP 03/16/2015 Physical Exam  Constitutional: She appears well-developed and well-nourished. No distress.  Sitting up in bed, but leaning to her left side. Appears mildly uncomfortable.   HENT:  Head: Normocephalic and atraumatic.  Eyes: Conjunctivae are normal. Right eye exhibits no discharge. Left eye exhibits no discharge.  Neck: Neck supple.  Cardiovascular: Normal rate, regular rhythm and normal heart sounds.  Exam reveals no gallop and no friction rub.   No murmur heard. Pulmonary/Chest: Effort normal and breath sounds normal. No respiratory distress.  Abdominal: Soft. She exhibits no distension. There is tenderness. There is no rebound.  Tenderness across the lower abdomen, worse on the right. Voluntary guarding.  Genitourinary:  Right CVA tenderness  Musculoskeletal: She exhibits no edema or tenderness.  Neurological: She is alert.  Skin: Skin is warm and dry. She is not diaphoretic.  Psychiatric: She has a normal mood and affect. Her behavior is normal. Thought content normal.  Nursing note and vitals reviewed.   ED Course  Procedures (including critical care time) Labs Review Labs Reviewed - No data to display  Imaging Review No results found.   EKG Interpretation None      MDM   Final diagnoses:  Pyelonephritis    29 year old female with lower abdominal/right flank pain. Clinically UTI/pyelonephritis. Previous workups for reviewed. Her urinalysis being in the month was pretty unremarkable but did end up having culture proven UTI. Has not been treated.  She appears milfy uncomfortable, but not toxic. She is afebrile. Will place IV. Treat symptoms. Dose of Rocephin. Anticipate discharge on Keflex.    Raeford RazorStephen Verdon Ferrante, MD 04/06/15 [phone]

## 2015-04-06 LAB — URINE CULTURE

## 2015-04-09 ENCOUNTER — Encounter (HOSPITAL_COMMUNITY): Payer: Self-pay | Admitting: *Deleted

## 2015-04-09 ENCOUNTER — Emergency Department (HOSPITAL_COMMUNITY)
Admission: EM | Admit: 2015-04-09 | Discharge: 2015-04-09 | Disposition: A | Discharge status: Home / Self Care | Payer: Medicaid Other | Attending: Emergency Medicine | Admitting: Emergency Medicine

## 2015-04-09 ENCOUNTER — Emergency Department (HOSPITAL_COMMUNITY): Payer: Medicaid Other

## 2015-04-09 DIAGNOSIS — R102 Pelvic and perineal pain: Secondary | ICD-10-CM | POA: Insufficient documentation

## 2015-04-09 DIAGNOSIS — Z3202 Encounter for pregnancy test, result negative: Secondary | ICD-10-CM | POA: Insufficient documentation

## 2015-04-09 DIAGNOSIS — Z87448 Personal history of other diseases of urinary system: Secondary | ICD-10-CM | POA: Insufficient documentation

## 2015-04-09 DIAGNOSIS — R6883 Chills (without fever): Secondary | ICD-10-CM | POA: Insufficient documentation

## 2015-04-09 DIAGNOSIS — Z8619 Personal history of other infectious and parasitic diseases: Secondary | ICD-10-CM | POA: Insufficient documentation

## 2015-04-09 DIAGNOSIS — R110 Nausea: Secondary | ICD-10-CM | POA: Insufficient documentation

## 2015-04-09 DIAGNOSIS — Z87440 Personal history of urinary (tract) infections: Secondary | ICD-10-CM | POA: Insufficient documentation

## 2015-04-09 DIAGNOSIS — Z720 Tobacco use: Secondary | ICD-10-CM | POA: Insufficient documentation

## 2015-04-09 DIAGNOSIS — I10 Essential (primary) hypertension: Secondary | ICD-10-CM | POA: Insufficient documentation

## 2015-04-09 LAB — CBC WITH DIFFERENTIAL/PLATELET
BASOS ABS: 0 10*3/uL (ref 0.0–0.1)
Basophils Relative: 1 % (ref 0–1)
Eosinophils Absolute: 0.1 10*3/uL (ref 0.0–0.7)
Eosinophils Relative: 2 % (ref 0–5)
HEMATOCRIT: 39.3 % (ref 36.0–46.0)
Hemoglobin: 13.4 g/dL (ref 12.0–15.0)
LYMPHS ABS: 2 10*3/uL (ref 0.7–4.0)
Lymphocytes Relative: 45 % (ref 12–46)
MCH: 26.1 pg (ref 26.0–34.0)
MCHC: 34.1 g/dL (ref 30.0–36.0)
MCV: 76.5 fL — AB (ref 78.0–100.0)
MONOS PCT: 19 % — AB (ref 3–12)
Monocytes Absolute: 0.8 10*3/uL (ref 0.1–1.0)
NEUTROS ABS: 1.5 10*3/uL — AB (ref 1.7–7.7)
NEUTROS PCT: 34 % — AB (ref 43–77)
PLATELETS: 232 10*3/uL (ref 150–400)
RBC: 5.14 MIL/uL — ABNORMAL HIGH (ref 3.87–5.11)
RDW: 15.5 % (ref 11.5–15.5)
WBC: 4.4 10*3/uL (ref 4.0–10.5)

## 2015-04-09 LAB — COMPREHENSIVE METABOLIC PANEL
ALT: 18 U/L (ref 14–54)
ANION GAP: 10 (ref 5–15)
AST: 24 U/L (ref 15–41)
Albumin: 3.7 g/dL (ref 3.5–5.0)
Alkaline Phosphatase: 54 U/L (ref 38–126)
BILIRUBIN TOTAL: 0.5 mg/dL (ref 0.3–1.2)
BUN: 7 mg/dL (ref 6–20)
CALCIUM: 9.2 mg/dL (ref 8.9–10.3)
CO2: 25 mmol/L (ref 22–32)
Chloride: 102 mmol/L (ref 101–111)
Creatinine, Ser: 0.96 mg/dL (ref 0.44–1.00)
GFR calc Af Amer: >60 mL/min (ref >60)
GFR calc non Af Amer: >60 mL/min (ref >60)
Glucose, Bld: 84 mg/dL (ref 65–99)
Potassium: 3.6 mmol/L (ref 3.5–5.1)
Sodium: 137 mmol/L (ref 135–145)
Total Protein: 6.6 g/dL (ref 6.5–8.1)

## 2015-04-09 LAB — URINALYSIS, ROUTINE W REFLEX MICROSCOPIC
Bilirubin Urine: NEGATIVE
Glucose, UA: NEGATIVE mg/dL
Ketones, ur: NEGATIVE mg/dL
LEUKOCYTES UA: NEGATIVE
NITRITE: NEGATIVE
PH: 6.5 (ref 5.0–8.0)
Protein, ur: NEGATIVE mg/dL
Specific Gravity, Urine: 1.018 (ref 1.005–1.030)
Urobilinogen, UA: 0.2 mg/dL (ref 0.0–1.0)

## 2015-04-09 LAB — URINE MICROSCOPIC-ADD ON

## 2015-04-09 LAB — WET PREP, GENITAL
Trich, Wet Prep: NONE SEEN
Yeast Wet Prep HPF POC: NONE SEEN

## 2015-04-09 LAB — PREGNANCY, URINE: Preg Test, Ur: NEGATIVE

## 2015-04-09 MED ORDER — IBUPROFEN 200 MG PO TABS
600.0000 mg | ORAL_TABLET | Freq: Once | ORAL | Status: AC
  Administered 2015-04-09: 600 mg via ORAL
  Filled 2015-04-09: qty 3

## 2015-04-09 MED ORDER — OXYCODONE-ACETAMINOPHEN 5-325 MG PO TABS
1.0000 | ORAL_TABLET | Freq: Once | ORAL | Status: AC
  Administered 2015-04-09: 1 via ORAL

## 2015-04-09 MED ORDER — DOXYCYCLINE HYCLATE 100 MG PO CAPS: 100.0000 mg | ORAL_CAPSULE | Freq: Two times a day (BID) | ORAL | Status: DC

## 2015-04-09 MED ORDER — OXYCODONE-ACETAMINOPHEN 5-325 MG PO TABS
ORAL_TABLET | ORAL | Status: AC
  Filled 2015-04-09: qty 1

## 2015-04-09 MED ORDER — HYDROCODONE-ACETAMINOPHEN 5-325 MG PO TABS
1.0000 | ORAL_TABLET | Freq: Four times a day (QID) | ORAL | Status: DC | PRN
Start: 2015-04-09 — End: 2015-07-07

## 2015-04-09 MED ORDER — HYDROCODONE-ACETAMINOPHEN 5-325 MG PO TABS
1.0000 | ORAL_TABLET | Freq: Once | ORAL | Status: AC
  Administered 2015-04-09: 1 via ORAL
  Filled 2015-04-09: qty 1

## 2015-04-09 NOTE — ED Provider Notes (Signed)
CSN: [id number]     Arrival date & time 04/09/15  1537 History   First MD Initiated Contact with Patient 04/09/15 1633     Chief Complaint  Patient presents with  . Flank Pain     (Consider location/radiation/quality/duration/timing/severity/associated sxs/prior Treatment) HPI Comments: Pt comes in with cc of lower quadrant abdominal pain and right flank pain. Pt reports that she started having abd discomfort earlier in July. She had came to the ER, had a normal workup, urine cultures however came back +, antibiotics ordered but pt didn't fill it. She was seen again on 7/14, and treated as clinical pyelonephritis and started on keflex. Pt started the meds, felt like she was improving, but she started having severe lower quadrant abd pain and Rt sided back pain again and decided to come to the ER. Pt denies any uti like sx. She denies any vaginal discharge, bleeding. There is not hx of pelvic disorders. She denies any recent STS or risk factors for them.   ROS 10 Systems reviewed and are negative for acute change except as noted in the HPI.     Patient is a 29 y.o. female presenting with flank pain. The history is provided by the patient.  Flank Pain Associated symptoms include abdominal pain.    Past Medical History  Diagnosis Date  . UTI (lower urinary tract infection)   . BV (bacterial vaginosis)   . UTI (lower urinary tract infection)   . Hypertension   . Kidney infection    Past Surgical History  Procedure Laterality Date  . Cesarean section      x3   Family History  Problem Relation Age of Onset  . Hypertension Mother   . Heart disease Father   . Asthma Brother    History  Substance Use Topics  . Smoking status: Current Every Day Smoker -- 0.25 packs/day    Types: Cigarettes  . Smokeless tobacco: Current User  . Alcohol Use: No   OB History    Gravida Para Term Preterm AB TAB SAB Ectopic Multiple Living   4 3 1 2 1 1   0 0 3     Review of Systems   Constitutional: Positive for chills.  Gastrointestinal: Positive for nausea and abdominal pain.  Genitourinary: Positive for flank pain.  All other systems reviewed and are negative.     Allergies  Review of patient's allergies indicates no known allergies.  Home Medications   Prior to Admission medications   Medication Sig Start Date End Date Taking? Authorizing Provider  acetaminophen (TYLENOL) 500 MG tablet Take 1 tablet (500 mg total) by mouth every 6 (six) hours as needed. Patient not taking: Reported on 03/23/2015 10/30/14   Fayrene HelperBowie Tran, PA-C  azithromycin (ZITHROMAX Z-PAK) 250 MG tablet 2 po day one, then 1 daily x 4 days Patient not taking: Reported on 10/30/2014 07/08/14   Loren Raceravid Yelverton, MD  cephALEXin (KEFLEX) 500 MG capsule Take 1 capsule (500 mg total) by mouth 4 (four) times daily. Patient not taking: Reported on 03/23/2015 10/30/14   Fayrene HelperBowie Tran, PA-C  cephALEXin (KEFLEX) 500 MG capsule Take 1 capsule (500 mg total) by mouth 4 (four) times daily. 04/05/15   Raeford RazorStephen Kohut, MD  HYDROcodone-homatropine [facility](HYCODAN) 5-1.5 MG/5ML syrup Take 5 mLs by mouth every 6 (six) hours as needed (Pain). 03/23/15   Nicole Pisciotta, PA-C  ibuprofen (ADVIL,MOTRIN) 800 MG tablet Take 1 tablet (800 mg total) by mouth 3 (three) times daily. Patient not taking: Reported on 03/23/2015 10/19/14  Roxy Horseman, PA-C  loperamide (IMODIUM) 2 MG capsule Take 1 capsule (2 mg total) by mouth 4 (four) times daily as needed for diarrhea or loose stools. 03/23/15   Nicole Pisciotta, PA-C  loratadine (CLARITIN) 10 MG tablet Take 1 tablet (10 mg total) by mouth daily. Patient not taking: Reported on 10/30/2014 07/08/14   Loren Racer, MD  methocarbamol (ROBAXIN) 500 MG tablet Take 2 tablets (1,000 mg total) by mouth every 8 (eight) hours as needed for muscle spasms. Patient not taking: Reported on 10/30/2014 07/08/14   Loren Racer, MD  ondansetron (ZOFRAN) 4 MG tablet Take 1 tablet (4 mg total) by mouth every 8 (eight)  hours as needed for nausea or vomiting. 03/23/15   Nicole Pisciotta, PA-C  oxyCODONE-acetaminophen (PERCOCET/ROXICET) 5-325 MG per tablet Take 1 tablet by mouth every 4 (four) hours as needed for severe pain. 04/05/15   Raeford Razor, MD  oxymetazoline (AFRIN NASAL SPRAY) 0.05 % nasal spray Place 1 spray into both nostrils 2 (two) times daily. Patient not taking: Reported on 10/30/2014 07/08/14   Loren Racer, MD   BP 141/108 mmHg  Pulse 109  Temp(Src) 98.7 F (37.1 C) (Oral)  Resp 18  Ht  (1.626 m)  Wt 161 lb 6.4 oz (73.211 kg)  BMI 27.69 kg/m2  SpO2 100%  LMP 03/16/2015 Physical Exam  Constitutional: She is oriented to person, place, and time. She appears well-developed.  HENT:  Head: Normocephalic and atraumatic.  Eyes: Conjunctivae and EOM are normal. Pupils are equal, round, and reactive to light.  Neck: Normal range of motion. Neck supple.  Cardiovascular: Normal rate, regular rhythm, normal heart sounds and intact distal pulses.   No murmur heard. Pulmonary/Chest: Effort normal. No respiratory distress. She has no wheezes.  Abdominal: Soft. Bowel sounds are normal. She exhibits no distension. There is tenderness. There is guarding. There is no rebound.  Lower quadrant tenderness and suprapubic tenderness  Genitourinary: Vagina normal and uterus normal.  External exam - normal, no lesions Speculum exam: Pt has some white discharge, no blood Bimanual exam: Patient has moderate CMT, no adnexal tenderness or fullness and cervical os is closed  Neurological: She is alert and oriented to person, place, and time.  Skin: Skin is warm and dry.  Nursing note and vitals reviewed.   ED Course  Procedures (including critical care time) Labs Review Labs Reviewed  URINE CULTURE  WET PREP, GENITAL  URINALYSIS, ROUTINE W REFLEX MICROSCOPIC (NOT AT [facility])  CBC WITH DIFFERENTIAL/PLATELET  COMPREHENSIVE METABOLIC PANEL  POC URINE PREG, ED  GC/CHLAMYDIA PROBE AMP ([place]) NOT AT  [facility]    Imaging Review No results found.   EKG Interpretation None      :50 Exam reveals moderate tenderness to the cervix. She maintains no risk for STD, and so does her partner. Declining GC and Chlamydia tx, unless they are +. Will get US pelvis. Pain appears to be coming from pelvis in origin.  MDM   Final diagnoses:  None     DDx includes:  Nephrolithiasis Pyelonephritis UTI/Cystitis Ovarian cyst TOA Ectopic pregnancy PID STD  Pt comes in with pelvic pain and back pain. She is on meds for pyelonephritis. Urine cultures x 2 reviewed from this month, the last one being from 7/14 -which was non specific.  No fevers here, no SIRs on vitals, not toxic appearing, however, she has R sided discomfort and suprapubic and lower quadrant discomfort. Will expand the workup and get pelvic exam and she might need additional imaging.  Derwood Kaplan, MD 04/09/15 [phone]

## 2015-04-09 NOTE — Discharge Instructions (Signed)
Take the antibiotics as prescribed. We suspect that you have pelvic infection. [facility]   Abdominal Pain, Women Abdominal (stomach, pelvic, or belly) pain can be caused by many things. It is important to tell your doctor:  The location of the pain.  Does it come and go or is it present all the time?  Are there things that start the pain (eating certain foods, exercise)?  Are there other symptoms associated with the pain (fever, nausea, vomiting, diarrhea)? All of this is helpful to know when trying to find the cause of the pain. CAUSES   Stomach: virus or bacteria infection, or ulcer.  Intestine: appendicitis (inflamed appendix), regional ileitis (Crohn's disease), ulcerative colitis (inflamed colon), irritable bowel syndrome, diverticulitis (inflamed diverticulum of the colon), or cancer of the stomach or intestine.  Gallbladder disease or stones in the gallbladder.  Kidney disease, kidney stones, or infection.  Pancreas infection or cancer.  Fibromyalgia (pain disorder).  Diseases of the female organs:  Uterus: fibroid (non-cancerous) tumors or infection.  Fallopian tubes: infection or tubal pregnancy.  Ovary: cysts or tumors.  Pelvic adhesions (scar tissue).  Endometriosis (uterus lining tissue growing in the pelvis and on the pelvic organs).  Pelvic congestion syndrome (female organs filling up with blood just before the menstrual period).  Pain with the menstrual period.  Pain with ovulation (producing an egg).  Pain with an IUD (intrauterine device, birth control) in the uterus.  Cancer of the female organs.  Functional pain (pain not caused by a disease, may improve without treatment).  Psychological pain.  Depression. DIAGNOSIS  Your doctor will decide the seriousness of your pain by doing an examination.  Blood tests.  X-rays.  Ultrasound.  CT scan (computed tomography, special type of  X-ray).  MRI (magnetic resonance imaging).  Cultures, for infection.  Barium enema (dye inserted in the large intestine, to better view it with X-rays).  Colonoscopy (looking in intestine with a lighted tube).  Laparoscopy (minor surgery, looking in abdomen with a lighted tube).  Major abdominal exploratory surgery (looking in abdomen with a large incision). TREATMENT  The treatment will depend on the cause of the pain.   Many cases can be observed and treated at home.  Over-the-counter medicines recommended by your caregiver.  Prescription medicine.  Antibiotics, for infection.  Birth control pills, for painful periods or for ovulation pain.  Hormone treatment, for endometriosis.  Nerve blocking injections.  Physical therapy.  Antidepressants.  Counseling with a psychologist or psychiatrist.  Minor or major surgery. HOME CARE INSTRUCTIONS   Do not take laxatives, unless directed by your caregiver.  Take over-the-counter pain medicine only if ordered by your caregiver. Do not take aspirin because it can cause an upset stomach or bleeding.  Try a clear liquid diet (broth or water) as ordered by your caregiver. Slowly move to a bland diet, as tolerated, if the pain is related to the stomach or intestine.  Have a thermometer and take your temperature several times a day, and record it.  Bed rest and sleep, if it helps the pain.  Avoid sexual intercourse, if it causes pain.  Avoid stressful situations.  Keep your follow-up appointments and tests, as your caregiver orders.  If the pain does not go away with medicine or surgery, you may try:  Acupuncture.  Relaxation exercises (yoga, meditation).  Group therapy.  Counseling. SEEK MEDICAL CARE IF:   You notice certain foods cause stomach pain.  Your home  care treatment is not helping your pain.  You need stronger pain medicine.  You want your IUD removed.  You feel faint or lightheaded.  You  develop nausea and vomiting.  You develop a rash.  You are having side effects or an allergy to your medicine. SEEK IMMEDIATE MEDICAL CARE IF:   Your pain does not go away or gets worse.  You have a fever.  Your pain is felt only in portions of the abdomen. The right side could possibly be appendicitis. The left lower portion of the abdomen could be colitis or diverticulitis.  You are passing blood in your stools (bright red or black tarry stools, with or without vomiting).  You have blood in your urine.  You develop chills, with or without a fever.  You pass out. MAKE SURE YOU:   Understand these instructions.  Will watch your condition.  Will get help right away if you are not doing well or get worse. Document Released: 07/06/2007 Document Revised: 01/23/2014 Document Reviewed: 07/26/2009 [facility] Patient Information 2015 [place], [place]. This information is not intended to replace advice given to you by your health care provider. Make sure you discuss any questions you have with your health care provider.  Pelvic Pain Pelvic pain is pain felt below the belly button and between your hips. It can be caused by many different things. It is important to get help right away. This is especially true for severe, sharp, or unusual pain that comes on suddenly.  HOME CARE  Only take medicine as told by your doctor.  Rest as told by your doctor.  Eat a healthy diet, such as fruits, vegetables, and lean meats.  Drink enough fluids to keep your pee (urine) clear or pale yellow, or as told.  Avoid sex (intercourse) if it causes pain.  Apply warm or cold packs to your lower belly (abdomen). Use the type of pack that helps the pain.  Avoid situations that cause you stress.  Keep a journal to track your pain. Write down:  When the pain started.  Where it is located.  If there are things that seem to be related to the pain, such as food or your period.  Follow up with your  doctor as told. GET HELP RIGHT AWAY IF:   You have heavy bleeding from the vagina.  You have more pelvic pain.  You feel lightheaded or pass out (faint).  You have chills.  You have pain when you pee or have blood in your pee.  You cannot stop having watery poop (diarrhea).  You cannot stop throwing up (vomiting).  You have a fever or lasting symptoms for more than 3 days.  You have a fever and your symptoms suddenly get worse.  You are being physically or sexually abused.  Your medicine does not help your pain.  You have fluid (discharge) coming from your vagina that is not normal. MAKE SURE YOU:  Understand these instructions.  Will watch your condition.  Will get help if you are not doing well or get worse. Document Released: 02/25/2008 Document Revised: 03/09/2012 Document Reviewed: 12/29/2011 [facility] Patient Information 2015 [place], [place]. This information is not intended to replace advice given to you by your health care provider. Make sure you discuss any questions you have with your health care provider.

## 2015-04-09 NOTE — ED Notes (Signed)
Pt was diagnosed with a kidney infection 4 days ago. Pt states that she has been taking the medications as prescribed with no relief.

## 2015-04-09 NOTE — ED Notes (Signed)
Patient transported to Ultrasound 

## 2015-04-10 LAB — GC/CHLAMYDIA PROBE AMP (~~LOC~~) NOT AT ARMC
CHLAMYDIA, DNA PROBE: NEGATIVE
NEISSERIA GONORRHEA: NEGATIVE

## 2015-04-11 LAB — URINE CULTURE: Culture: 5000

## 2015-05-09 ENCOUNTER — Encounter: Payer: Self-pay | Admitting: Family Medicine

## 2015-05-09 ENCOUNTER — Ambulatory Visit (INDEPENDENT_AMBULATORY_CARE_PROVIDER_SITE_OTHER): Payer: Self-pay | Admitting: Family Medicine

## 2015-05-09 VITALS — BP 145/81 | HR 113 | Temp 98.3°F | Resp 20 | Ht 64.0 in | Wt 161.5 lb

## 2015-05-09 DIAGNOSIS — N760 Acute vaginitis: Secondary | ICD-10-CM

## 2015-05-09 DIAGNOSIS — B9689 Other specified bacterial agents as the cause of diseases classified elsewhere: Secondary | ICD-10-CM

## 2015-05-09 DIAGNOSIS — A499 Bacterial infection, unspecified: Secondary | ICD-10-CM

## 2015-05-09 MED ORDER — METRONIDAZOLE 500 MG PO TABS: 500.0000 mg | ORAL_TABLET | Freq: Two times a day (BID) | ORAL | Status: DC

## 2015-05-09 MED ORDER — DICLOFENAC SODIUM 75 MG PO TBEC: 75.0000 mg | DELAYED_RELEASE_TABLET | Freq: Two times a day (BID) | ORAL | Status: DC

## 2015-05-09 NOTE — Progress Notes (Signed)
   Subjective:    Patient ID: Lisa Vazquez, female    DOB: August 01, 1986, 29 y.o.   MRN: [id number]  HPI Patient seen for intermittent abdominal pain/lower pelvic pain for approximately one month. Patient was seen in the emergency department 3 times last month and was treated for urinary tract infection in the middle of July. Her pain started shortly after being treated with antibiotics: She was treated with Keflex for UTI and with ceftriaxone and doxycycline for presumed PID. Warm bath will help with the pain as does narcotic pain medication. No. Factors. Pain is described as sharp and in the pelvis. The pain is intermittent and occurs anywhere from 1 to 7 days with a duration of several hours. She has been tested for gonorrhea and chlamydia, which were negative. She denies any new partners. She did have a wet prep which showed bacterial vaginosis and has not been treated for that  An ultrasound performed in July showed a multiple follicles bilaterally with no predominant cyst. No focal cause for pain.  Review of Systems  Constitutional: Negative for fever, chills and fatigue.  Gastrointestinal: Negative for nausea, vomiting, abdominal pain, diarrhea and constipation.  Genitourinary: Negative for dysuria, urgency, flank pain, decreased urine volume, vaginal bleeding, vaginal discharge and vaginal pain.   I have reviewed the patients past medical, family, and social history.  I have reviewed the patient's medication list and allergies.     Objective:   Physical Exam  Constitutional: She appears well-developed and well-nourished.  HENT:  Head: Normocephalic and atraumatic.  Abdominal: Soft. Bowel sounds are normal. She exhibits no distension and no mass. There is no tenderness. There is no rebound and no guarding.  Genitourinary: There is no rash, tenderness, lesion or injury on the right labia. There is no rash, tenderness, lesion or injury on the left labia. Uterus is not deviated, not  enlarged, not fixed and not tender. Cervix exhibits motion tenderness. Cervix exhibits no discharge and no friability. Right adnexum displays tenderness. Right adnexum displays no mass. Left adnexum displays tenderness. Left adnexum displays no mass. There is erythema (vaginal mucosa appears inflamed) in the vagina. No tenderness or bleeding in the vagina. No foreign body around the vagina. Vaginal discharge (white foul-smelling vaginal discharge) found.  Skin: Skin is warm and dry. No rash noted. No erythema. No pallor.      Assessment & Plan:  1. Bacterial vaginal infection We'll treat with Flagyl 500 twice a day. With her vaginal mucosa appearing quite inflamed, this may be the source of her discomfort. Gonorrhea and chlamydia were not repeated given the history. As the patient has a history of ASCUS and is in need of repeat screening, will have the patient arrange follow-up for well woman exam and reevaluate patient's pelvic pain at that time. - Wet prep, genital

## 2015-05-09 NOTE — Patient Instructions (Signed)
Bacterial Vaginosis Bacterial vaginosis is a vaginal infection that occurs when the normal balance of bacteria in the vagina is disrupted. It results from an overgrowth of certain bacteria. This is the most common vaginal infection in women of childbearing age. Treatment is important to prevent complications, especially in pregnant women, as it can cause a premature delivery. CAUSES  Bacterial vaginosis is caused by an increase in harmful bacteria that are normally present in smaller amounts in the vagina. Several different kinds of bacteria can cause bacterial vaginosis. However, the reason that the condition develops is not fully understood. RISK FACTORS Certain activities or behaviors can put you at an increased risk of developing bacterial vaginosis, including:  Having a new sex partner or multiple sex partners.  Douching.  Using an intrauterine device (IUD) for contraception. Women do not get bacterial vaginosis from toilet seats, bedding, swimming pools, or contact with objects around them. SIGNS AND SYMPTOMS  Some women with bacterial vaginosis have no signs or symptoms. Common symptoms include:  Grey vaginal discharge.  A fishlike odor with discharge, especially after sexual intercourse.  Itching or burning of the vagina and vulva.  Burning or pain with urination. DIAGNOSIS  Your health care provider will take a medical history and examine the vagina for signs of bacterial vaginosis. A sample of vaginal fluid may be taken. Your health care provider will look at this sample under a microscope to check for bacteria and abnormal cells. A vaginal pH test may also be done.  TREATMENT  Bacterial vaginosis may be treated with antibiotic medicines. These may be given in the form of a pill or a vaginal cream. A second round of antibiotics may be prescribed if the condition comes back after treatment.  HOME CARE INSTRUCTIONS   Only take over-the-counter or prescription medicines as  directed by your health care provider.  If antibiotic medicine was prescribed, take it as directed. Make sure you finish it even if you start to feel better.  Do not have sex until treatment is completed.  Tell all sexual partners that you have a vaginal infection. They should see their health care provider and be treated if they have problems, such as a mild rash or itching.  Practice safe sex by using condoms and only having one sex partner. SEEK MEDICAL CARE IF:   Your symptoms are not improving after 3 days of treatment.  You have increased discharge or pain.  You have a fever. MAKE SURE YOU:   Understand these instructions.  Will watch your condition.  Will get help right away if you are not doing well or get worse. FOR MORE INFORMATION  Centers for Disease Control and Prevention, Division of STD Prevention: www.cdc.gov/std American Sexual Health Association (ASHA): www.ashastd.org  Document Released: 09/08/2005 Document Revised: 06/29/2013 Document Reviewed: 04/20/2013 [employer] Patient Information 2015 [employer]. This information is not intended to replace advice given to you by your health care provider. Make sure you discuss any questions you have with your health care provider.  

## 2015-05-09 NOTE — Progress Notes (Signed)
Pt reports having intermittent abdominal pain x several weeks - seen @ [facility] on 04/09/15.

## 2015-05-10 LAB — WET PREP, GENITAL
TRICH WET PREP: NONE SEEN
WBC, Wet Prep HPF POC: NONE SEEN
Yeast Wet Prep HPF POC: NONE SEEN

## 2015-06-06 ENCOUNTER — Ambulatory Visit: Payer: Self-pay | Admitting: Family Medicine

## 2015-07-07 ENCOUNTER — Encounter (HOSPITAL_COMMUNITY): Payer: Self-pay | Admitting: Emergency Medicine

## 2015-07-07 ENCOUNTER — Emergency Department (HOSPITAL_COMMUNITY): Payer: Self-pay

## 2015-07-07 ENCOUNTER — Emergency Department (HOSPITAL_COMMUNITY)
Admission: EM | Admit: 2015-07-07 | Discharge: 2015-07-07 | Disposition: A | Discharge status: Home / Self Care | Payer: Self-pay | Attending: Emergency Medicine | Admitting: Emergency Medicine

## 2015-07-07 DIAGNOSIS — Z87440 Personal history of urinary (tract) infections: Secondary | ICD-10-CM | POA: Insufficient documentation

## 2015-07-07 DIAGNOSIS — Z87448 Personal history of other diseases of urinary system: Secondary | ICD-10-CM | POA: Insufficient documentation

## 2015-07-07 DIAGNOSIS — I10 Essential (primary) hypertension: Secondary | ICD-10-CM | POA: Insufficient documentation

## 2015-07-07 DIAGNOSIS — Z8742 Personal history of other diseases of the female genital tract: Secondary | ICD-10-CM | POA: Insufficient documentation

## 2015-07-07 DIAGNOSIS — M722 Plantar fascial fibromatosis: Secondary | ICD-10-CM | POA: Insufficient documentation

## 2015-07-07 DIAGNOSIS — Z791 Long term (current) use of non-steroidal anti-inflammatories (NSAID): Secondary | ICD-10-CM | POA: Insufficient documentation

## 2015-07-07 DIAGNOSIS — Z720 Tobacco use: Secondary | ICD-10-CM | POA: Insufficient documentation

## 2015-07-07 DIAGNOSIS — Z792 Long term (current) use of antibiotics: Secondary | ICD-10-CM | POA: Insufficient documentation

## 2015-07-07 DIAGNOSIS — M79671 Pain in right foot: Secondary | ICD-10-CM

## 2015-07-07 MED ORDER — HYDROCODONE-ACETAMINOPHEN 5-325 MG PO TABS
1.0000 | ORAL_TABLET | Freq: Once | ORAL | Status: AC
  Administered 2015-07-07: 1 via ORAL
  Filled 2015-07-07: qty 1

## 2015-07-07 MED ORDER — HYDROCODONE-ACETAMINOPHEN 5-325 MG PO TABS: 1.0000 | ORAL_TABLET | Freq: Four times a day (QID) | ORAL | Status: DC | PRN

## 2015-07-07 MED ORDER — IBUPROFEN 200 MG PO TABS
400.0000 mg | ORAL_TABLET | Freq: Once | ORAL | Status: AC
  Administered 2015-07-07: 400 mg via ORAL
  Filled 2015-07-07: qty 2

## 2015-07-07 NOTE — ED Provider Notes (Signed)
CSN: [id number]     Arrival date & time 07/07/15  1925 History   First MD Initiated Contact with Patient 07/07/15 2037     Chief Complaint  Patient presents with  . Foot Pain     (Consider location/radiation/quality/duration/timing/severity/associated sxs/prior Treatment) Patient is a 29 y.o. female presenting with lower extremity pain. The history is provided by the patient.  Foot Pain Pertinent negatives include no chest pain, no abdominal pain, no headaches and no shortness of breath.  Patient c/o right foot pain for the past week. Pain constant, severe, and located in plantar aspect/arch right foot. No hx same pain. Denies injury. Worse w weight bearing or walking. Does work as cna, and did work today. No hx plantar fasciitis. No hx arthritis or ctd. No redness/rash to area. No fever or chills. States when steps on sends a sharp pain upwards. No calf pain or swelling. No knee or hip pain.        Past Medical History  Diagnosis Date  . UTI (lower urinary tract infection)   . BV (bacterial vaginosis)   . UTI (lower urinary tract infection)   . Hypertension   . Kidney infection    Past Surgical History  Procedure Laterality Date  . Cesarean section      x3   Family History  Problem Relation Age of Onset  . Hypertension Mother   . Heart disease Father   . Asthma Brother    Social History  Substance Use Topics  . Smoking status: Current Every Day Smoker -- 0.25 packs/day for 7 years    Types: Cigarettes  . Smokeless tobacco: Current User  . Alcohol Use: No   OB History    Gravida Para Term Preterm AB TAB SAB Ectopic Multiple Living   4 3 1 2 1 1   0 0 3     Review of Systems  Constitutional: Negative for fever and chills.  HENT: Negative for sore throat.   Eyes: Negative for redness.  Respiratory: Negative for shortness of breath.   Cardiovascular: Negative for chest pain.  Gastrointestinal: Negative for vomiting and abdominal pain.  Genitourinary: Negative  for flank pain.  Musculoskeletal: Negative for back pain and neck pain.  Skin: Negative for rash and wound.  Neurological: Negative for weakness, numbness and headaches.  Hematological: Does not bruise/bleed easily.  Psychiatric/Behavioral: Negative for confusion.      Allergies  Review of patient's allergies indicates no known allergies.  Home Medications   Prior to Admission medications   Medication Sig Start Date End Date Taking? Authorizing Provider  acetaminophen (TYLENOL) 500 MG tablet Take 1 tablet (500 mg total) by mouth every 6 (six) hours as needed. 10/30/14   Fayrene HelperBowie Tran, PA-C  diclofenac (VOLTAREN) 75 MG EC tablet Take 1 tablet (75 mg total) by mouth 2 (two) times daily with a meal. 05/09/15   Levie HeritageJacob J Stinson, DO  HYDROcodone-acetaminophen (NORCO/VICODIN) 5-325 MG per tablet Take 1 tablet by mouth every 6 (six) hours as needed. Patient not taking: Reported on 05/09/2015 04/09/15   Derwood KaplanAnkit Nanavati, MD  ibuprofen (ADVIL,MOTRIN) 800 MG tablet Take 1 tablet (800 mg total) by mouth 3 (three) times daily. 10/19/14   Roxy Horsemanobert Browning, PA-C  metroNIDAZOLE (FLAGYL) 500 MG tablet Take 1 tablet (500 mg total) by mouth 2 (two) times daily. 05/09/15   Rhona RaiderJacob J Stinson, DO  ondansetron (ZOFRAN) 4 MG tablet Take 1 tablet (4 mg total) by mouth every 8 (eight) hours as needed for nausea or vomiting. 03/23/15  Nicole Pisciotta, PA-C  oxyCODONE-acetaminophen (PERCOCET/ROXICET) 5-325 MG per tablet Take 1 tablet by mouth every 4 (four) hours as needed for severe pain. Patient not taking: Reported on 05/09/2015 04/05/15   Raeford Razor, MD   BP 153/101 mmHg  Pulse 90  Temp(Src) 98.1 F (36.7 C) (Oral)  Resp 18  SpO2 99%  LMP 06/27/2015 (Approximate) Physical Exam  Constitutional: She appears well-developed and well-nourished. No distress.  HENT:  Head: Atraumatic.  Eyes: Conjunctivae are normal. No scleral icterus.  Neck: Neck supple. No tracheal deviation present.  Cardiovascular: Normal rate and  intact distal pulses.   Pulmonary/Chest: Effort normal. No respiratory distress.  Abdominal: Normal appearance. She exhibits no distension. There is no tenderness.  Genitourinary:  No cva tenderness  Musculoskeletal: She exhibits no edema.  CTLS spine, non tender, aligned, no step off. Good rom right knee and ankle without pain. Dp/pt 2+. w palp along arch of right foot pt c/o significant pain reproducing symptoms, including, but not necessary localized to anterior heel. No skin changes or erythema.   Neurological: She is alert.  Skin: Skin is warm and dry. No rash noted. She is not diaphoretic.  Psychiatric: She has a normal mood and affect.  Nursing note and vitals reviewed.   ED Course  Procedures (including critical care time) Labs Review  Dg Foot Complete Right  07/07/2015  CLINICAL DATA:  Right foot pain across the plantar arch for 1 week. No known injury. EXAM: RIGHT FOOT COMPLETE - 3+ VIEW COMPARISON:  None. FINDINGS: There is no evidence of fracture or dislocation. There is no evidence of arthropathy or other focal bone abnormality. Soft tissues are unremarkable. IMPRESSION: Negative. Electronically Signed   By: Amie Portland M.D.   On: 07/07/2015 21:11      I have personally reviewed and evaluated these images as part of my medical decision-making.    MDM    Imaging.  Reviewed nursing notes and prior charts for additional history.   Pt requests pain med.  States tylenol, ibuprofen and tramadol at home didn't help.   Pt states does not have to drive, has ride. Hydrocodone po. Last had med for pain about 3 pm today.  Will also given motrin.   Xray neg acute.  No sign infection on exam. Suspect plantar fasciitis.   rec close pcp f/u incl for recheck bp.      Cathren Laine, MD 07/07/15 2118

## 2015-07-07 NOTE — Discharge Instructions (Signed)
It was our pleasure to provide your ER care today - we hope that you feel better.  Rest. Ice/coldpacks to sore area.   Try gently rolling/stretching the plantar aspect foot up and down on tennis ball or soup can for symptom relief.  Take motrin or aleve as need for pain. You may also take hydrocodone as need for pain. No driving when taking hydrocodone. Also, do not take tylenol or acetaminophen containing medication when taking hydrocodone.  Follow up with primary care doctor in 1 weeks time - also have your blood pressure rechecked then as it is high tonight.  You were given pain medication in the ER - no driving for the next 4 hours.     Plantar Fasciitis Plantar fasciitis is a painful foot condition that affects the heel. It occurs when the band of tissue that connects the toes to the heel bone (plantar fascia) becomes irritated. This can happen after exercising too much or doing other repetitive activities (overuse injury). The pain from plantar fasciitis can range from mild irritation to severe pain that makes it difficult for you to walk or move. The pain is usually worse in the morning or after you have been sitting or lying down for a while. CAUSES This condition may be caused by:  Standing for long periods of time.  Wearing shoes that do not fit.  Doing high-impact activities, including running, aerobics, and ballet.  Being overweight.  Having an abnormal way of walking (gait).  Having tight calf muscles.  Having high arches in your feet.  Starting a new athletic activity. SYMPTOMS The main symptom of this condition is heel pain. Other symptoms include:  Pain that gets worse after activity or exercise.  Pain that is worse in the morning or after resting.  Pain that goes away after you walk for a few minutes. DIAGNOSIS This condition may be diagnosed based on your signs and symptoms. Your health care provider will also do a physical exam to check for:  A tender  area on the bottom of your foot.  A high arch in your foot.  Pain when you move your foot.  Difficulty moving your foot. You may also need to have imaging studies to confirm the diagnosis. These can include:  X-rays.  Ultrasound.  MRI. TREATMENT  Treatment for plantar fasciitis depends on the severity of the condition. Your treatment may include:  Rest, ice, and over-the-counter pain medicines to manage your pain.  Exercises to stretch your calves and your plantar fascia.  A splint that holds your foot in a stretched, upward position while you sleep (night splint).  Physical therapy to relieve symptoms and prevent problems in the future.  Cortisone injections to relieve severe pain.  Extracorporeal shock wave therapy (ESWT) to stimulate damaged plantar fascia with electrical impulses. It is often used as a last resort before surgery.  Surgery, if other treatments have not worked after 12 months. HOME CARE INSTRUCTIONS  Take medicines only as directed by your health care provider.  Avoid activities that cause pain.  Roll the bottom of your foot over a bag of ice or a bottle of cold water. Do this for 20 minutes, 3-4 times a day.  Perform simple stretches as directed by your health care provider.  Try wearing athletic shoes with air-sole or gel-sole cushions or soft shoe inserts.  Wear a night splint while sleeping, if directed by your health care provider.  Keep all follow-up appointments with your health care provider. PREVENTION  Do not perform exercises or activities that cause heel pain.  Consider finding low-impact activities if you continue to have problems.  Lose weight if you need to. The best way to prevent plantar fasciitis is to avoid the activities that aggravate your plantar fascia. SEEK MEDICAL CARE IF:  Your symptoms do not go away after treatment with home care measures.  Your pain gets worse.  Your pain affects your ability to move or do  your daily activities.   This information is not intended to replace advice given to you by your health care provider. Make sure you discuss any questions you have with your health care provider.   Document Released: 06/03/2001 Document Revised: 05/30/2015 Document Reviewed: 07/19/2014 Elsevier Interactive Patient Education [employer]

## 2015-07-07 NOTE — ED Notes (Signed)
Pt arrived to the ED with a complaint of right sided muscle pain.  Pt states that the pain has been present for a week.  Pt states that the pain is shooting from her foot to her neck.  Pt has taken OTC medications without relief.

## 2015-07-29 ENCOUNTER — Encounter (HOSPITAL_COMMUNITY): Payer: Self-pay | Admitting: *Deleted

## 2015-07-29 ENCOUNTER — Emergency Department (HOSPITAL_COMMUNITY)
Admission: EM | Admit: 2015-07-29 | Discharge: 2015-07-29 | Disposition: A | Discharge status: Home / Self Care | Payer: Self-pay | Attending: Emergency Medicine | Admitting: Emergency Medicine

## 2015-07-29 DIAGNOSIS — Z720 Tobacco use: Secondary | ICD-10-CM | POA: Insufficient documentation

## 2015-07-29 DIAGNOSIS — Z8742 Personal history of other diseases of the female genital tract: Secondary | ICD-10-CM | POA: Insufficient documentation

## 2015-07-29 DIAGNOSIS — Z87448 Personal history of other diseases of urinary system: Secondary | ICD-10-CM | POA: Insufficient documentation

## 2015-07-29 DIAGNOSIS — Z79899 Other long term (current) drug therapy: Secondary | ICD-10-CM | POA: Insufficient documentation

## 2015-07-29 DIAGNOSIS — M545 Low back pain, unspecified: Secondary | ICD-10-CM

## 2015-07-29 DIAGNOSIS — Z3202 Encounter for pregnancy test, result negative: Secondary | ICD-10-CM | POA: Insufficient documentation

## 2015-07-29 DIAGNOSIS — M79671 Pain in right foot: Secondary | ICD-10-CM | POA: Insufficient documentation

## 2015-07-29 DIAGNOSIS — Z87440 Personal history of urinary (tract) infections: Secondary | ICD-10-CM | POA: Insufficient documentation

## 2015-07-29 DIAGNOSIS — I10 Essential (primary) hypertension: Secondary | ICD-10-CM | POA: Insufficient documentation

## 2015-07-29 LAB — URINALYSIS, ROUTINE W REFLEX MICROSCOPIC
Bilirubin Urine: NEGATIVE
GLUCOSE, UA: NEGATIVE mg/dL
HGB URINE DIPSTICK: NEGATIVE
Ketones, ur: NEGATIVE mg/dL
LEUKOCYTES UA: NEGATIVE
Nitrite: NEGATIVE
PROTEIN: NEGATIVE mg/dL
SPECIFIC GRAVITY, URINE: 1.019 (ref 1.005–1.030)
Urobilinogen, UA: 0.2 mg/dL (ref 0.0–1.0)
pH: 6 (ref 5.0–8.0)

## 2015-07-29 LAB — POC URINE PREG, ED: Preg Test, Ur: NEGATIVE

## 2015-07-29 MED ORDER — IBUPROFEN 400 MG PO TABS
800.0000 mg | ORAL_TABLET | Freq: Once | ORAL | Status: AC
  Administered 2015-07-29: 800 mg via ORAL
  Filled 2015-07-29: qty 2

## 2015-07-29 NOTE — ED Notes (Signed)
PT reports she feels like she has a UTI

## 2015-07-29 NOTE — ED Notes (Signed)
PT was seen on 07-02-15 for RT foot pain . Pt reports she was told to roll foot on a can , The foot pain has not improved and pt now has lower back pain.  Pt also states she could have a UTI.

## 2015-07-29 NOTE — ED Notes (Signed)
Declined W/C at D/C and was escorted to lobby by RN. 

## 2015-07-29 NOTE — ED Notes (Signed)
PT left with out D/c instructions

## 2015-07-29 NOTE — ED Notes (Signed)
As pain meds given as ordered for PT . PT stated " That shit does not work for Me." Pt was offered to have Pa talk to her but decided to take med as ordered.

## 2015-07-29 NOTE — ED Provider Notes (Signed)
CSN: [id number]     Arrival date & time 07/29/15  0750 History   First MD Initiated Contact with Patient 07/29/15 0759     No chief complaint on file.    (Consider location/radiation/quality/duration/timing/severity/associated sxs/prior Treatment) HPI   29 year old female with history of hypertension presents for evaluation of foot pain. Patient report for the past 3-4 weeks she has had persistent right foot pain. She described pain as a sharp sensation to the bottom of the foot which sometimes radiates all the way up to her neck. Pain is worsened with ambulation. States she was seen on 10/15 in the ER for same complaint and was diagnosed with having plantar fasciitis. She was instructed to roll a tennis ball on the soles of her foot which she has been trying but it provides no relief. She has also been taking her leftover tramadol and muscle relaxant without adequate improvement. She denies any change in her shoes, denies any specific injuring, no fever or rash.  Furthermore patient also complaining of a lateral low back pain. She described pain as a sharp sensation worsening with ambulation. She endorse having urinary frequency and urgency without burning sensation. Symptoms felt similar to prior UTI and request to have her urine checked. Denies any abdominal pain, hematuria, vaginal bleeding or vaginal discharge. Her last menstrual period was September 28. Patient works as a LawyerCNA.  Past Medical History  Diagnosis Date  . UTI (lower urinary tract infection)   . BV (bacterial vaginosis)   . UTI (lower urinary tract infection)   . Hypertension   . Kidney infection    Past Surgical History  Procedure Laterality Date  . Cesarean section      x3   Family History  Problem Relation Age of Onset  . Hypertension Mother   . Heart disease Father   . Asthma Brother    Social History  Substance Use Topics  . Smoking status: Current Every Day Smoker -- 0.25 packs/day for 7 years    Types:  Cigarettes  . Smokeless tobacco: Current User  . Alcohol Use: No   OB History    Gravida Para Term Preterm AB TAB SAB Ectopic Multiple Living   4 3 1 2 1 1   0 0 3     Review of Systems  All other systems reviewed and are negative.     Allergies  Review of patient's allergies indicates no known allergies.  Home Medications   Prior to Admission medications   Medication Sig Start Date End Date Taking? Authorizing Provider  acetaminophen (TYLENOL) 500 MG tablet Take 1 tablet (500 mg total) by mouth every 6 (six) hours as needed. Patient taking differently: Take 1,000 mg by mouth every 8 (eight) hours as needed for mild pain.  10/30/14   Fayrene HelperBowie Lucendia Leard, PA-C  diclofenac (VOLTAREN) 75 MG EC tablet Take 1 tablet (75 mg total) by mouth 2 (two) times daily with a meal. 05/09/15   Levie HeritageJacob J Stinson, DO  HYDROcodone-acetaminophen (NORCO/VICODIN) 5-325 MG tablet Take 1-2 tablets by mouth every 6 (six) hours as needed for moderate pain or severe pain. 07/07/15   Cathren LaineKevin Steinl, MD  metroNIDAZOLE (FLAGYL) 500 MG tablet Take 1 tablet (500 mg total) by mouth 2 (two) times daily. Patient not taking: Reported on 07/07/2015 05/09/15   Rhona RaiderJacob J Stinson, DO  ondansetron (ZOFRAN) 4 MG tablet Take 1 tablet (4 mg total) by mouth every 8 (eight) hours as needed for nausea or vomiting. 03/23/15   Wynetta EmeryNicole Pisciotta, PA-C  LMP 06/27/2015 (Approximate) Physical Exam  Constitutional: She appears well-developed and well-nourished. No distress.  HENT:  Head: Atraumatic.  Eyes: Conjunctivae are normal.  Neck: Neck supple.  Abdominal: Soft. There is no tenderness.  Musculoskeletal: She exhibits tenderness (Tenderness along lumbar and paralumbar spinal muscle on palpation. No overlying skin changes. Decreased range of motion secondary to pain.).  Negative straight leg raise.  Right foot: Tenderness along the plantar aspects of foot on palpation with increasing pain from dorsiflexion. No gross deformity. Brisk cap refills.  Dorsalis pedis and posterior tibialis pulses are 2+.  Neurological: She is alert.  Skin: No rash noted.  Psychiatric: She has a normal mood and affect.  Nursing note and vitals reviewed.   ED Course  Procedures (including critical care time)  Patient presents with right foot pain ongoing for nearly a month. Pain is consistence with plantar fasciitis. No signs of infection noted. She also has low back pain and urinary discomfort with history of UTI. Plan to check a urinalysis and a pregnancy test.  10:44 AM Pregnancy test is negative, UA without signs of infection.  i recommend pt to f/u with a podiatrist for further management.  Othewise, pt has no red flags concerning for infection.  Or cord compression.   Labs Review Labs Reviewed  URINALYSIS, ROUTINE W REFLEX MICROSCOPIC (NOT AT [facility])  POC URINE PREG, ED     MDM   Final diagnoses:  Right foot pain  Bilateral low back pain without sciatica    BP 135/96 mmHg  Pulse 83  Temp(Src) 97.7 F (36.5 C) (Oral)  Resp 18  Ht  (1.626 m)  Wt 160 lb (72.576 kg)  BMI 27.45 kg/m2  SpO2 98%  LMP 06/20/2015     Fayrene Helper, PA-C 07/29/15 1045  Eber Hong, MD 07/29/15 2039

## 2015-07-29 NOTE — Discharge Instructions (Signed)
Please follow up with a podiatrist for further evaluation of your condition.  RICE for Routine Care of Injuries Theroutine careofmanyinjuriesincludes rest, ice, compression, and elevation (RICE therapy). RICE therapy is often recommended for injuries to soft tissues, such as a muscle strain, ligament injuries, bruises, and overuse injuries. It can also be used for some bony injuries. Using RICE therapy can help to relieve pain, lessen swelling, and enable your body to heal. Rest Rest is required to allow your body to heal. This usually involves reducing your normal activities and avoiding use of the injured part of your body. Generally, you can return to your normal activities when you are comfortable and have been given permission by your health care provider. Ice Icing your injury helps to keep the swelling down, and it lessens pain. Do not apply ice directly to your skin.  Put ice in a plastic bag.  Place a towel between your skin and the bag.  Leave the ice on for 20 minutes, 2-3 times a day. Do this for as long as you are directed by your health care provider. Compression Compression means putting pressure on the injured area. Compression helps to keep swelling down, gives support, and helps with discomfort. Compression may be done with an elastic bandage. If an elastic bandage has been applied, follow these general tips:  Remove and reapply the bandage every 3-4 hours or as directed by your health care provider.  Make sure the bandage is not wrapped too tightly, because this can cut off circulation. If part of your body beyond the bandage becomes blue, numb, cold, swollen, or more painful, your bandage is most likely too tight. If this occurs, remove your bandage and reapply it more loosely.  See your health care provider if the bandage seems to be making your problems worse rather than better. Elevation Elevation means keeping the injured area raised. This helps to lessen swelling  and decrease pain. If possible, your injured area should be elevated at or above the level of your heart or the center of your chest. WHEN SHOULD I SEEK MEDICAL CARE? You should seek medical care if:  Your pain and swelling continue.  Your symptoms are getting worse rather than improving. These symptoms may indicate that further evaluation or further X-rays are needed. Sometimes, X-rays may not show a small broken bone (fracture) until a number of days later. Make a follow-up appointment with your health care provider. WHEN SHOULD I SEEK IMMEDIATE MEDICAL CARE? You should seek immediate medical care if:  You have sudden severe pain at or below the area of your injury.  You have redness or increased swelling around your injury.  You have tingling or numbness at or below the area of your injury that does not improve after you remove the elastic bandage.   This information is not intended to replace advice given to you by your health care provider. Make sure you discuss any questions you have with your health care provider.   Document Released: 12/21/2000 Document Revised: 05/30/2015 Document Reviewed: 08/16/2014 Elsevier Interactive Patient Education [employer]

## 2015-08-26 ENCOUNTER — Emergency Department (HOSPITAL_COMMUNITY)
Admission: EM | Admit: 2015-08-26 | Discharge: 2015-08-26 | Disposition: A | Discharge status: Home / Self Care | Payer: Self-pay | Attending: Emergency Medicine | Admitting: Emergency Medicine

## 2015-08-26 ENCOUNTER — Encounter (HOSPITAL_COMMUNITY): Payer: Self-pay | Admitting: *Deleted

## 2015-08-26 ENCOUNTER — Emergency Department (HOSPITAL_COMMUNITY): Payer: Self-pay

## 2015-08-26 DIAGNOSIS — F17210 Nicotine dependence, cigarettes, uncomplicated: Secondary | ICD-10-CM | POA: Insufficient documentation

## 2015-08-26 DIAGNOSIS — I10 Essential (primary) hypertension: Secondary | ICD-10-CM | POA: Insufficient documentation

## 2015-08-26 DIAGNOSIS — N23 Unspecified renal colic: Secondary | ICD-10-CM | POA: Insufficient documentation

## 2015-08-26 DIAGNOSIS — R319 Hematuria, unspecified: Secondary | ICD-10-CM

## 2015-08-26 DIAGNOSIS — R231 Pallor: Secondary | ICD-10-CM | POA: Insufficient documentation

## 2015-08-26 DIAGNOSIS — N390 Urinary tract infection, site not specified: Secondary | ICD-10-CM | POA: Insufficient documentation

## 2015-08-26 DIAGNOSIS — Z3202 Encounter for pregnancy test, result negative: Secondary | ICD-10-CM | POA: Insufficient documentation

## 2015-08-26 DIAGNOSIS — Z8742 Personal history of other diseases of the female genital tract: Secondary | ICD-10-CM | POA: Insufficient documentation

## 2015-08-26 LAB — COMPREHENSIVE METABOLIC PANEL
ALK PHOS: 50 U/L (ref 38–126)
ALT: 12 U/L — AB (ref 14–54)
ANION GAP: 8 (ref 5–15)
AST: 22 U/L (ref 15–41)
Albumin: 4.3 g/dL (ref 3.5–5.0)
BILIRUBIN TOTAL: 0.9 mg/dL (ref 0.3–1.2)
BUN: 12 mg/dL (ref 6–20)
CO2: 25 mmol/L (ref 22–32)
CREATININE: 0.85 mg/dL (ref 0.44–1.00)
Calcium: 9.5 mg/dL (ref 8.9–10.3)
Chloride: 104 mmol/L (ref 101–111)
GFR calc non Af Amer: >60 mL/min (ref >60)
GLUCOSE: 84 mg/dL (ref 65–99)
Potassium: 4.2 mmol/L (ref 3.5–5.1)
Sodium: 137 mmol/L (ref 135–145)
TOTAL PROTEIN: 7.8 g/dL (ref 6.5–8.1)

## 2015-08-26 LAB — URINALYSIS, ROUTINE W REFLEX MICROSCOPIC
Glucose, UA: NEGATIVE mg/dL
Ketones, ur: NEGATIVE mg/dL
NITRITE: NEGATIVE
PROTEIN: NEGATIVE mg/dL
SPECIFIC GRAVITY, URINE: 1.028 (ref 1.005–1.030)
pH: 5.5 (ref 5.0–8.0)

## 2015-08-26 LAB — URINE MICROSCOPIC-ADD ON

## 2015-08-26 LAB — CBC
HCT: 42.3 % (ref 36.0–46.0)
HEMOGLOBIN: 14.1 g/dL (ref 12.0–15.0)
MCH: 25.9 pg — AB (ref 26.0–34.0)
MCHC: 33.3 g/dL (ref 30.0–36.0)
MCV: 77.8 fL — ABNORMAL LOW (ref 78.0–100.0)
PLATELETS: 305 10*3/uL (ref 150–400)
RBC: 5.44 MIL/uL — AB (ref 3.87–5.11)
RDW: 15.7 % — ABNORMAL HIGH (ref 11.5–15.5)
WBC: 6.3 10*3/uL (ref 4.0–10.5)

## 2015-08-26 LAB — I-STAT BETA HCG BLOOD, ED (MC, WL, AP ONLY)

## 2015-08-26 LAB — POC URINE PREG, ED: Preg Test, Ur: NEGATIVE

## 2015-08-26 MED ORDER — ONDANSETRON HCL 4 MG/2ML IJ SOLN
4.0000 mg | Freq: Once | INTRAMUSCULAR | Status: AC
  Administered 2015-08-26: 4 mg via INTRAVENOUS
  Filled 2015-08-26: qty 2

## 2015-08-26 MED ORDER — CEPHALEXIN 500 MG PO CAPS: 500.0000 mg | ORAL_CAPSULE | Freq: Two times a day (BID) | ORAL | Status: DC

## 2015-08-26 MED ORDER — SODIUM CHLORIDE 0.9 % IV BOLUS (SEPSIS)
1000.0000 mL | Freq: Once | INTRAVENOUS | Status: AC
  Administered 2015-08-26: 1000 mL via INTRAVENOUS

## 2015-08-26 MED ORDER — MORPHINE SULFATE (PF) 4 MG/ML IV SOLN
4.0000 mg | Freq: Once | INTRAVENOUS | Status: AC
  Administered 2015-08-26: 4 mg via INTRAVENOUS
  Filled 2015-08-26: qty 1

## 2015-08-26 MED ORDER — KETOROLAC TROMETHAMINE 30 MG/ML IJ SOLN
30.0000 mg | Freq: Once | INTRAMUSCULAR | Status: AC
  Administered 2015-08-26: 30 mg via INTRAVENOUS
  Filled 2015-08-26: qty 1

## 2015-08-26 NOTE — ED Notes (Signed)
Pt presents to ed with c/o lower back pain, lower abdominal pain, missed period, sts LMP was in early October, sts always regular periods, did home pregnancy test, was negative

## 2015-08-26 NOTE — Discharge Instructions (Signed)
Your urine shows evidence of infection. You did not have a kidney stone on ultrasound today. Please take and finish the antibiotics as directed. Please follow-up with [facility]. Also contact one of the clinics below to establish primary care.  Take medications as prescribed. Return to the emergency room for worsening condition or new concerning symptoms. Follow up with your regular doctor. If you don't have a regular doctor use one of the numbers below to establish a primary care doctor.   Emergency Department Resource Guide 1) Find a Doctor and Pay Out of Pocket Although you won't have to find out who is covered by your insurance plan, it is a good idea to ask around and get recommendations. You will then need to call the office and see if the doctor you have chosen will accept you as a new patient and what types of options they offer for patients who are self-pay. Some doctors offer discounts or will set up payment plans for their patients who do not have insurance, but you will need to ask so you aren't surprised when you get to your appointment.  2) Contact Your Local Health Department Not all health departments have doctors that can see patients for sick visits, but many do, so it is worth a call to see if yours does. If you don't know where your local health department is, you can check in your phone book. The CDC also has a tool to help you locate your state's health department, and many state websites also have listings of all of their local health departments.  3) Find a Walk-in Clinic If your illness is not likely to be very severe or complicated, you may want to try a walk in clinic. These are popping up all over the country in pharmacies, drugstores, and shopping centers. They're usually staffed by nurse practitioners or physician assistants that have been trained to treat common illnesses and complaints. They're usually fairly quick and inexpensive. However, if you have serious  medical issues or chronic medical problems, these are probably not your best option.  No Primary Care Doctor: - Call Health Connect at  [phone] - they can help you locate a primary care doctor that  accepts your insurance, provides certain services, etc. - Physician Referral [phone]- [phone]  Emergency Department Resource Guide 1) Find a Doctor and Pay Out of Pocket Although you won't have to find out who is covered by your insurance plan, it is a good idea to ask around and get recommendations. You will then need to call the office and see if the doctor you have chosen will accept you as a new patient and what types of options they offer for patients who are self-pay. Some doctors offer discounts or will set up payment plans for their patients who do not have insurance, but you will need to ask so you aren't surprised when you get to your appointment.  2) Contact Your Local Health Department Not all health departments have doctors that can see patients for sick visits, but many do, so it is worth a call to see if yours does. If you don't know where your local health department is, you can check in your phone book. The CDC also has a tool to help you locate your state's health department, and many state websites also have listings of all of their local health departments.  3) Find a Walk-in Clinic If your illness is not likely to be very severe or complicated, you may want to  try a walk in clinic. These are popping up all over the country in pharmacies, drugstores, and shopping centers. They're usually staffed by nurse practitioners or physician assistants that have been trained to treat common illnesses and complaints. They're usually fairly quick and inexpensive. However, if you have serious medical issues or chronic medical problems, these are probably not your best option.  No Primary Care Doctor: - Call Health Connect at  [phone] - they can help you locate a primary care doctor that   accepts your insurance, provides certain services, etc. - Physician Referral Service- [phone]  Chronic Pain Problems: Organization         Address  Phone   Notes  [facility]  [phone] Patients need to be referred by their primary care doctor.   Medication Assistance: Organization         Address  Phone   Notes  [facility] Medication [facility] [address] [place], [place] [address] [phone] --Must be a resident of [facility] -- Must have NO insurance coverage whatsoever (no Medicaid/ Medicare, etc.) -- The pt. MUST have a primary care doctor that directs their care regularly and follows them in the community   [facility]  [phone]   [employer]  [phone]    Agencies that provide inexpensive medical care: Organization         Address  Phone   Notes  [place]  [phone]   [facility]    [phone]   [facility] [place],  [address] [phone]   [place] [address], [place] [phone]   [facility]    [phone]   [facility]    [phone]   [place] and [place] [address], [place] Phone:  [phone], Fax:  [phone] Hours of Operation:  9 am - 6 pm, M-F.  Also accepts Medicaid/Medicare and self-pay.  [facility] for [place] [address], [address], [place] Phone: [phone], Fax: [phone]. Hours of Operation:  8:30 am - 5:30 pm, M-F.  Also accepts Medicaid and self-pay.  [facility] [place] [address] [place] Phone: [phone]   [place], [place], [place] [phone], Ext. [phone] Mondays & Thursdays: 7-9 AM.  First 15 patients are seen on a first come, first serve basis.    [place] Providers:  Organization          Address  Phone   Notes  [facility] [address], [place] [phone] Also accepts self-pay patients.  [facility] [id number] [place], [place]  [phone]   [place], [address], [place] [phone]   [facility] [address] [place] [phone]   Lucianne Lei [address], [place]   [phone] Only accepts [place] patients after they have their name applied to their card.   Self-Pay (no insurance) in [facility]:  Organization         Address  Phone   Notes  Sickle Cell Patients, [facility] [place] [phone]   [facility] [place] [phone]  [facility] [place]  [place], [address], [place] [phone]   [facility]/Dr. Osei-Bonsu  [address] [place] or [address], [place] [phone] Phone number for both [place] and [place] locations is the same.  [facility] and [facility] [address], [place] [phone]   [facility] [address] [place] or [address] [phone]   [facility] [address], [place] [phone], phone; [phone], fax Sees patients 1st and 3rd Saturday of every month.  Must not qualify for public or private insurance (i.e. Medicaid, Medicare, [place] Health Choice, Veterans' Benefits)  Household income should be no more than 200% of the poverty level The clinic cannot treat you if you are pregnant or think you are pregnant  Sexually transmitted diseases are not treated at the clinic.

## 2015-08-26 NOTE — ED Provider Notes (Signed)
CSN: [id number]     Arrival date & time 08/26/15  1301 History   First MD Initiated Contact with Patient 08/26/15 1338     Chief Complaint  Patient presents with  . Abdominal Pain    HPI   Ms. Lisa Vazquez is an 29 y.o. female with history of recurrent UTIs who presents to the ED for evaluation of R flank pain. She states that the pain started yesterday. States that the pain radiates down into her lower abdomen. She rates the pain a 10/10 with movement and palpation but at rest it is bearable. She states she has had many UTIs and pyelonephritis in the past and they always feel similar to her current presentation. She denies any dysuria, urinary frequency/urgency, or gross hematuria. Denies fever, chills. Endorses mild nausea. Denies emesis or diarrhea. She has not tried anything to help her symptoms.  Past Medical History  Diagnosis Date  . UTI (lower urinary tract infection)   . BV (bacterial vaginosis)   . UTI (lower urinary tract infection)   . Hypertension   . Kidney infection    Past Surgical History  Procedure Laterality Date  . Cesarean section      x3   Family History  Problem Relation Age of Onset  . Hypertension Mother   . Heart disease Father   . Asthma Brother    Social History  Substance Use Topics  . Smoking status: Current Every Day Smoker -- 0.25 packs/day for 7 years    Types: Cigarettes  . Smokeless tobacco: Current User  . Alcohol Use: No   OB History    Gravida Para Term Preterm AB TAB SAB Ectopic Multiple Living   4 3 1 2 1 1   0 0 3     Review of Systems  All other systems reviewed and are negative.     Allergies  Review of patient's allergies indicates no known allergies.  Home Medications   Prior to Admission medications   Medication Sig Start Date End Date Taking? Authorizing Provider  ibuprofen (ADVIL,MOTRIN) 200 MG tablet Take 400 mg by mouth every 6 (six) hours as needed for moderate pain.   Yes Historical Provider, MD  acetaminophen  (TYLENOL) 500 MG tablet Take 1 tablet (500 mg total) by mouth every 6 (six) hours as needed. Patient not taking: Reported on 08/26/2015 10/30/14   Fayrene HelperBowie Tran, PA-C  diclofenac (VOLTAREN) 75 MG EC tablet Take 1 tablet (75 mg total) by mouth 2 (two) times daily with a meal. Patient not taking: Reported on 08/26/2015 05/09/15   Levie HeritageJacob J Stinson, DO  HYDROcodone-acetaminophen (NORCO/VICODIN) 5-325 MG tablet Take 1-2 tablets by mouth every 6 (six) hours as needed for moderate pain or severe pain. Patient not taking: Reported on 08/26/2015 07/07/15   Cathren LaineKevin Steinl, MD  metroNIDAZOLE (FLAGYL) 500 MG tablet Take 1 tablet (500 mg total) by mouth 2 (two) times daily. Patient not taking: Reported on 07/07/2015 05/09/15   Rhona RaiderJacob J Stinson, DO  ondansetron (ZOFRAN) 4 MG tablet Take 1 tablet (4 mg total) by mouth every 8 (eight) hours as needed for nausea or vomiting. Patient not taking: Reported on 08/26/2015 03/23/15   Joni ReiningNicole Pisciotta, PA-C   BP 150/94 mmHg  Pulse 84  Temp(Src) 97.7 F (36.5 C) (Oral)  Resp 16  SpO2 100%  LMP 07/16/2015 Physical Exam  Constitutional: She is oriented to person, place, and time.  Appears uncomfortable but NAD  HENT:  Right Ear: External ear normal.  Left Ear: External ear normal.  Nose: Nose normal.  Mouth/Throat: Oropharynx is clear and moist. No oropharyngeal exudate.  Eyes: Conjunctivae and EOM are normal. Pupils are equal, round, and reactive to light.  Neck: Normal range of motion. Neck supple.  Cardiovascular: Normal rate, regular rhythm, normal heart sounds and intact distal pulses.   No murmur heard. Pulmonary/Chest: Effort normal and breath sounds normal. No respiratory distress. She has no wheezes. She exhibits no tenderness.  Abdominal: Soft. Bowel sounds are normal. She exhibits no distension. There is tenderness in the suprapubic area. There is no rebound and no guarding.  +R CVA tenderness  Musculoskeletal: She exhibits no edema.  Neurological: She is alert and  oriented to person, place, and time. No cranial nerve deficit.  Skin: Skin is warm and dry. There is pallor.  Psychiatric: She has a normal mood and affect.  Nursing note and vitals reviewed.   ED Course  Procedures (including critical care time) Labs Review Labs Reviewed  COMPREHENSIVE METABOLIC PANEL - Abnormal; Notable for the following:    ALT 12 (*)    All other components within normal limits  CBC - Abnormal; Notable for the following:    RBC 5.44 (*)    MCV 77.8 (*)    MCH 25.9 (*)    RDW 15.7 (*)    All other components within normal limits  URINALYSIS, ROUTINE W REFLEX MICROSCOPIC (NOT AT [facility]) - Abnormal; Notable for the following:    APPearance CLOUDY (*)    Hgb urine dipstick TRACE (*)    Bilirubin Urine SMALL (*)    Leukocytes, UA SMALL (*)    All other components within normal limits  URINE MICROSCOPIC-ADD ON - Abnormal; Notable for the following:    Squamous Epithelial / LPF 0-5 (*)    Bacteria, UA MANY (*)    All other components within normal limits  I-STAT BETA HCG BLOOD, ED ([facility], [facility], [facility] ONLY)  POC URINE PREG, ED    Imaging Review US Renal  08/26/2015  CLINICAL DATA:  Flank pain.  Rule out stone.  History of UTI. EXAM: RENAL / URINARY TRACT ULTRASOUND COMPLETE COMPARISON:  None. FINDINGS: Right Kidney: Length: 11.7 cm. Mild pelviectasis without frank hydronephrosis. Right renal cortex appears normal in thickness and echogenicity without mass or cyst. No renal stone seen. Left Kidney: Length: 12 cm. No hydronephrosis. Left renal cortex appears normal in thickness and echogenicity without mass or cyst. No renal stone seen. Bladder: Appears normal for degree of bladder distention. Final images showing no postvoid residual. The mild right renal pelviectasis persists after the voiding. IMPRESSION: Mild right renal pelviectasis without frank hydronephrosis. No right-sided renal stone identified. Left kidney appears normal. Bladder partially decompressed, limiting  characterization, but grossly normal in appearance. Electronically Signed   By: Bary Richard M.D.   On: 08/26/2015 15:49   I have personally reviewed and evaluated these images and lab results as part of my medical decision-making.   EKG Interpretation None      MDM   Final diagnoses:  Urinary tract infection with hematuria, site unspecified  Renal colic on right side   UA with small leuks and many bacteria, trace hgb. Given pt's history and symptoms will give 10 day course of keflex. Pt would like to r/o renal stone given severity of her pain. Will give pain meds and get US renal here.   Renal study reveals mild right pelviectasis without hydro. No stones bilaterally. Given pt's recurrent UTIs will give referral to urology for follow-up.   Casimer Bilis  Nikki Dom, PA-C 08/28/15 1449  Richardean Canal, MD 08/28/15 [phone]

## 2015-09-03 ENCOUNTER — Inpatient Hospital Stay (HOSPITAL_COMMUNITY)
Admission: AD | Admit: 2015-09-03 | Discharge: 2015-09-03 | Disposition: A | Discharge status: Home / Self Care | Payer: Self-pay | Source: Ambulatory Visit | Attending: Family Medicine | Admitting: Family Medicine

## 2015-09-03 ENCOUNTER — Encounter (HOSPITAL_COMMUNITY): Payer: Self-pay | Admitting: *Deleted

## 2015-09-03 DIAGNOSIS — I10 Essential (primary) hypertension: Secondary | ICD-10-CM | POA: Insufficient documentation

## 2015-09-03 DIAGNOSIS — Z3202 Encounter for pregnancy test, result negative: Secondary | ICD-10-CM | POA: Insufficient documentation

## 2015-09-03 DIAGNOSIS — F17210 Nicotine dependence, cigarettes, uncomplicated: Secondary | ICD-10-CM | POA: Insufficient documentation

## 2015-09-03 DIAGNOSIS — N926 Irregular menstruation, unspecified: Secondary | ICD-10-CM | POA: Insufficient documentation

## 2015-09-03 LAB — URINE MICROSCOPIC-ADD ON: SQUAMOUS EPITHELIAL / LPF: NONE SEEN

## 2015-09-03 LAB — URINALYSIS, ROUTINE W REFLEX MICROSCOPIC
BILIRUBIN URINE: NEGATIVE
Glucose, UA: NEGATIVE mg/dL
Ketones, ur: NEGATIVE mg/dL
LEUKOCYTES UA: NEGATIVE
NITRITE: NEGATIVE
PH: 7 (ref 5.0–8.0)
Protein, ur: NEGATIVE mg/dL
SPECIFIC GRAVITY, URINE: 1.025 (ref 1.005–1.030)

## 2015-09-03 LAB — POCT PREGNANCY, URINE: Preg Test, Ur: NEGATIVE

## 2015-09-03 MED ORDER — ACETAMINOPHEN 500 MG PO TABS: 1000.0000 mg | ORAL_TABLET | Freq: Once | ORAL | Status: DC

## 2015-09-03 NOTE — MAU Provider Note (Signed)
History     CSN: [id number]  Arrival date and time: 09/03/15 4132   None     Chief Complaint  Patient presents with  . missed cycle    HPI    [name] is a 29 y.o. female [phone] at Unknown presenting to MAU with concerns about missed period. Her last period was 07/16/2015. She does not normally have irregular cycles.   On 12/4 she was again seen in the ED and had a negative hcg level.   Patient states she went to the bathroom and started spotting while here in MAU. She is unsure if this is her menstrual cycle starting. The patient is requesting an HCG level today.   She denies STI testing today.   OB History    Gravida Para Term Preterm AB TAB SAB Ectopic Multiple Living   0 0 3      Past Medical History  Diagnosis Date  . UTI (lower urinary tract infection)   . BV (bacterial vaginosis)   . UTI (lower urinary tract infection)   . Hypertension   . Kidney infection     Past Surgical History  Procedure Laterality Date  . Cesarean section      x3    Family History  Problem Relation Age of Onset  . Hypertension Mother   . Heart disease Father   . Asthma Brother     Social History  Substance Use Topics  . Smoking status: Current Every Day Smoker -- 0.25 packs/day for 7 years    Types: Cigarettes  . Smokeless tobacco: Current User  . Alcohol Use: No    Allergies: No Known Allergies  Prescriptions prior to admission  Medication Sig Dispense Refill Last Dose  . acetaminophen (TYLENOL) 500 MG tablet Take 1 tablet (500 mg total) by mouth every 6 (six) hours as needed. (Patient not taking: Reported on 08/26/2015) 30 tablet 0 07/07/2015 at Unknown time  . cephALEXin (KEFLEX) 500 MG capsule Take 1 capsule (500 mg total) by mouth 2 (two) times daily. 20 capsule 0   . diclofenac (VOLTAREN) 75 MG EC tablet Take 1 tablet (75 mg total) by mouth 2 (two) times daily with a meal. (Patient not taking: Reported on 08/26/2015) 30 tablet 1 Past  Month at Unknown time  . HYDROcodone-acetaminophen (NORCO/VICODIN) 5-325 MG tablet Take 1-2 tablets by mouth every 6 (six) hours as needed for moderate pain or severe pain. (Patient not taking: Reported on 08/26/2015) 10 tablet 0   . ibuprofen (ADVIL,MOTRIN) 200 MG tablet Take 400 mg by mouth every 6 (six) hours as needed for moderate pain.   08/26/2015 at Unknown time  . metroNIDAZOLE (FLAGYL) 500 MG tablet Take 1 tablet (500 mg total) by mouth 2 (two) times daily. (Patient not taking: Reported on 07/07/2015) 14 tablet 0 Completed Course at Unknown time  . ondansetron (ZOFRAN) 4 MG tablet Take 1 tablet (4 mg total) by mouth every 8 (eight) hours as needed for nausea or vomiting. (Patient not taking: Reported on 08/26/2015) 10 tablet 0 july    Results for orders placed or performed during the hospital encounter of 09/03/15 (from the past 48 hour(s))  Urinalysis, Routine w reflex microscopic (not at [facility])     Status: Abnormal   Collection Time: 09/03/15  8:45 AM  Result Value Ref Range   Color, Urine YELLOW YELLOW   APPearance CLEAR CLEAR   Specific Gravity, Urine 1.025 1.005 - 1.030   pH 7.0 5.0 -  8.0   Glucose, UA NEGATIVE NEGATIVE mg/dL   Hgb urine dipstick LARGE (A) NEGATIVE   Bilirubin Urine NEGATIVE NEGATIVE   Ketones, ur NEGATIVE NEGATIVE mg/dL   Protein, ur NEGATIVE NEGATIVE mg/dL   Nitrite NEGATIVE NEGATIVE   Leukocytes, UA NEGATIVE NEGATIVE  Urine microscopic-add on     Status: Abnormal   Collection Time: 09/03/15  8:45 AM  Result Value Ref Range   Squamous Epithelial / LPF NONE SEEN NONE SEEN   WBC, UA 0-5 0 - 5 WBC/hpf   RBC / HPF 6-30 0 - 5 RBC/hpf   Bacteria, UA RARE (A) NONE SEEN  Pregnancy, urine POC     Status: None   Collection Time: 09/03/15  9:21 AM  Result Value Ref Range   Preg Test, Ur NEGATIVE NEGATIVE    Comment:        THE SENSITIVITY OF THIS METHODOLOGY IS >24 mIU/mL     Review of Systems  Constitutional: Negative for fever and chills.   Gastrointestinal: Negative for nausea, vomiting and abdominal pain.   Physical Exam   Blood pressure 147/93, pulse 83, temperature 97.6 F (36.4 C), resp. rate 18, last menstrual period 07/16/2015.  Physical Exam  Constitutional: She is oriented to person, place, and time. She appears well-developed and well-nourished. No distress.  HENT:  Head: Normocephalic.  Eyes: Pupils are equal, round, and reactive to light.  GI: Soft.  Musculoskeletal: Normal range of motion.  Neurological: She is alert and oriented to person, place, and time.  Skin: Skin is warm. She is not diaphoretic.  Psychiatric: Her behavior is normal.    MAU Course  Procedures  None  MDM 12/4 HCG level negative> no reason to repeat today. Patient upset and left.   Urine pregnancy test negative today.   Assessment and Plan   A:  1. Irregular menstrual cycle   2. Encounter for pregnancy test with result negative    P:  Discharge home in stable condition At home pregnancy tests  Return to MAU or any ED for emergencies only.   Duane LopeJennifer I Anselma Herbel, NP 09/03/2015 7:40 PM

## 2015-09-03 NOTE — MAU Note (Signed)
Pt presents to MAU with complaints of not having her menstrual cycle since October. Took 3 pregnancy test and all were negative

## 2015-09-03 NOTE — Discharge Instructions (Signed)

## 2015-09-28 ENCOUNTER — Ambulatory Visit (INDEPENDENT_AMBULATORY_CARE_PROVIDER_SITE_OTHER): Payer: 59 | Admitting: Family Medicine

## 2015-09-28 ENCOUNTER — Encounter: Payer: Self-pay | Admitting: Family Medicine

## 2015-09-28 VITALS — BP 144/87 | HR 78 | Temp 98.1°F | Wt 161.6 lb

## 2015-09-28 DIAGNOSIS — N915 Oligomenorrhea, unspecified: Secondary | ICD-10-CM | POA: Diagnosis not present

## 2015-09-28 DIAGNOSIS — Z113 Encounter for screening for infections with a predominantly sexual mode of transmission: Secondary | ICD-10-CM

## 2015-09-28 DIAGNOSIS — Z1151 Encounter for screening for human papillomavirus (HPV): Secondary | ICD-10-CM

## 2015-09-28 DIAGNOSIS — Z124 Encounter for screening for malignant neoplasm of cervix: Secondary | ICD-10-CM | POA: Diagnosis not present

## 2015-09-28 DIAGNOSIS — N870 Mild cervical dysplasia: Secondary | ICD-10-CM

## 2015-09-28 LAB — TSH: TSH: 0.503 u[IU]/mL (ref 0.350–4.500)

## 2015-09-28 NOTE — Progress Notes (Signed)
   Subjective:    Patient ID: Lisa Vazquez, female    DOB: [birth year], 30 y.o.   MRN: [id number]  HPI Patient seen for irregular periods. Her irregular periods started in March 2016. At that time she had a pregnancy that ended in a therapeutic abortion. And since then her periods have been irregular. Her periods are currently every 60 days with bleeding that lasts 3-5 days. She reports no palliating or provoking factors. The bleeding has not become worse or better. She has not tried anything to improve her periods. She did have an ultrasound in July which showed multiple follicles on the ovaries bilaterally.   Review of Systems  Constitutional: Negative for fever, chills and fatigue.  Gastrointestinal: Negative for nausea, vomiting, abdominal pain, diarrhea and constipation.  Genitourinary: Negative for dysuria, vaginal bleeding, vaginal discharge, vaginal pain and dyspareunia.       Objective:   Physical Exam  Constitutional: She is oriented to person, place, and time. She appears well-developed and well-nourished.  HENT:  Head: Normocephalic and atraumatic.  Right Ear: External ear normal.  Left Ear: External ear normal.  Pulmonary/Chest: Effort normal and breath sounds normal.  Abdominal: Soft. Bowel sounds are normal. She exhibits no distension and no mass. There is no tenderness. There is no rebound and no guarding.  Genitourinary: There is no rash, tenderness, lesion or injury on the right labia. There is no rash, tenderness, lesion or injury on the left labia. Uterus is not deviated, not enlarged, not fixed and not tender. Cervix exhibits no motion tenderness, no discharge and no friability. Right adnexum displays no mass, no tenderness and no fullness. Left adnexum displays no mass, no tenderness and no fullness. No erythema, tenderness or bleeding in the vagina. No foreign body around the vagina. No signs of injury around the vagina. No vaginal discharge found.    Neurological: She is alert and oriented to person, place, and time.  Skin: Skin is warm and dry.      Assessment & Plan:  1. Oligomenorrhea ? PCOS.  Labs as below. - FSH - TSH - Prolactin - Insulin-like growth factor - DHEA-sulfate - Cytology - PAP  2. CIN I (cervical intraepithelial neoplasia I) Repeat PAP - Cytology - PAP

## 2015-09-29 LAB — FOLLICLE STIMULATING HORMONE: FSH: 4.5 m[IU]/mL

## 2015-09-29 LAB — DHEA-SULFATE: DHEA SO4: 92 ug/dL (ref 23–266)

## 2015-09-29 LAB — PROLACTIN: PROLACTIN: 9.8 ng/mL

## 2015-10-02 LAB — CYTOLOGY - PAP

## 2015-10-03 ENCOUNTER — Other Ambulatory Visit: Payer: Self-pay | Admitting: Family Medicine

## 2015-10-03 MED ORDER — ETONOGESTREL-ETHINYL ESTRADIOL 0.12-0.015 MG/24HR VA RING: VAGINAL_RING | VAGINAL | Status: DC

## 2015-10-06 LAB — INSULIN-LIKE GROWTH FACTOR
IGF-I, LC/MS: 203 ng/mL (ref 53–331)
Z-Score (Female): 0.7 SD (ref ?–2.0)

## 2015-10-15 ENCOUNTER — Emergency Department (HOSPITAL_COMMUNITY)
Admission: EM | Admit: 2015-10-15 | Discharge: 2015-10-15 | Disposition: A | Discharge status: Home / Self Care | Payer: 59 | Attending: Emergency Medicine | Admitting: Emergency Medicine

## 2015-10-15 ENCOUNTER — Encounter (HOSPITAL_COMMUNITY): Payer: Self-pay | Admitting: Emergency Medicine

## 2015-10-15 DIAGNOSIS — R300 Dysuria: Secondary | ICD-10-CM | POA: Diagnosis present

## 2015-10-15 DIAGNOSIS — I10 Essential (primary) hypertension: Secondary | ICD-10-CM | POA: Insufficient documentation

## 2015-10-15 DIAGNOSIS — Z8742 Personal history of other diseases of the female genital tract: Secondary | ICD-10-CM | POA: Insufficient documentation

## 2015-10-15 DIAGNOSIS — Z3202 Encounter for pregnancy test, result negative: Secondary | ICD-10-CM | POA: Diagnosis not present

## 2015-10-15 DIAGNOSIS — Z87440 Personal history of urinary (tract) infections: Secondary | ICD-10-CM | POA: Diagnosis not present

## 2015-10-15 DIAGNOSIS — N12 Tubulo-interstitial nephritis, not specified as acute or chronic: Secondary | ICD-10-CM | POA: Diagnosis not present

## 2015-10-15 DIAGNOSIS — Z87891 Personal history of nicotine dependence: Secondary | ICD-10-CM | POA: Insufficient documentation

## 2015-10-15 LAB — URINE MICROSCOPIC-ADD ON

## 2015-10-15 LAB — URINALYSIS, ROUTINE W REFLEX MICROSCOPIC
Bilirubin Urine: NEGATIVE
Glucose, UA: NEGATIVE mg/dL
HGB URINE DIPSTICK: NEGATIVE
Ketones, ur: NEGATIVE mg/dL
Nitrite: POSITIVE — AB
PH: 6 (ref 5.0–8.0)
Protein, ur: NEGATIVE mg/dL
SPECIFIC GRAVITY, URINE: 1.025 (ref 1.005–1.030)

## 2015-10-15 LAB — POC URINE PREG, ED: PREG TEST UR: NEGATIVE

## 2015-10-15 MED ORDER — ONDANSETRON HCL 4 MG PO TABS: 4.0000 mg | ORAL_TABLET | Freq: Four times a day (QID) | ORAL | Status: DC

## 2015-10-15 MED ORDER — CIPROFLOXACIN HCL 500 MG PO TABS: 500.0000 mg | ORAL_TABLET | Freq: Two times a day (BID) | ORAL | Status: DC

## 2015-10-15 MED ORDER — HYDROCODONE-ACETAMINOPHEN 5-325 MG PO TABS
1.0000 | ORAL_TABLET | Freq: Once | ORAL | Status: AC
  Administered 2015-10-15: 1 via ORAL
  Filled 2015-10-15: qty 1

## 2015-10-15 MED ORDER — ONDANSETRON 4 MG PO TBDP
4.0000 mg | ORAL_TABLET | Freq: Once | ORAL | Status: AC
Start: 2015-10-15 — End: 2015-10-15
  Administered 2015-10-15: 4 mg via ORAL
  Filled 2015-10-15: qty 1

## 2015-10-15 NOTE — ED Provider Notes (Signed)
CSN: [id number]     Arrival date & time 10/15/15  0827 History   First MD Initiated Contact with Patient 10/15/15 [phone]     Chief Complaint  Patient presents with  . Urinary Tract Infection   HPI  Lisa Vazquez is a 54 year oldl female with PMHx of HTN and frequent UTIs presenting with dysuria, increased frequency and flank pain. She reports onset of suprapubic pressure and dysuria approximately 1 week ago. She notes that she has had increased frequency over the past few days. She also notes left-sided flank pain that began 2 days ago. Her flank pain is sharp and stabbing and exacerbated by movement. Associated symptoms include chills and nausea without vomiting. She has not taken any OTC UTI symptom relievers. She does report taking hydrocodone which she has at home which helped her flank pain. She states "this feels like the beginning of a kidney infection". She does not have a PCP. Denies fevers, headaches, dizziness, syncope, chest pain, SOB, cough, vomiting, diarrhea, hematuria, vaginal discharge or rashes.   Past Medical History  Diagnosis Date  . UTI (lower urinary tract infection)   . BV (bacterial vaginosis)   . UTI (lower urinary tract infection)   . Hypertension   . Kidney infection    Past Surgical History  Procedure Laterality Date  . Cesarean section      x3   Family History  Problem Relation Age of Onset  . Hypertension Mother   . Heart disease Father   . Asthma Brother    Social History  Substance Use Topics  . Smoking status: Current Every Day Smoker -- 0.25 packs/day for 7 years    Types: Cigarettes  . Smokeless tobacco: Current User  . Alcohol Use: No   OB History    Gravida Para Term Preterm AB TAB SAB Ectopic Multiple Living   0 0 3     Review of Systems  Constitutional: Positive for chills. Negative for fever.  Gastrointestinal: Positive for nausea and abdominal pain. Negative for vomiting.  Genitourinary: Positive for dysuria, frequency  and flank pain. Negative for hematuria, vaginal discharge, difficulty urinating and pelvic pain.  All other systems reviewed and are negative.     Allergies  Review of patient's allergies indicates no known allergies.  Home Medications   Prior to Admission medications   Medication Sig Start Date End Date Taking? Authorizing Provider  ciprofloxacin (CIPRO) 500 MG tablet Take 1 tablet (500 mg total) by mouth 2 (two) times daily. 10/15/15   Rolm Gala Nakkia Mackiewicz, PA-C  etonogestrel-ethinyl estradiol (NUVARING) 0.12-0.015 MG/24HR vaginal ring Insert vaginally and leave in place for 3 consecutive weeks, then remove for 1 week. 10/03/15   Levie Heritage, DO  ibuprofen (ADVIL,MOTRIN) 200 MG tablet Take 400 mg by mouth every 6 (six) hours as needed for moderate pain. Reported on 09/28/2015    Historical Provider, MD  ondansetron (ZOFRAN) 4 MG tablet Take 1 tablet (4 mg total) by mouth every 6 (six) hours. 10/15/15   Linday Rhodes, PA-C   BP 142/101 mmHg  Pulse 77  Temp(Src) 97.9 F (36.6 C)  Resp 18  SpO2 99%  LMP 10/07/2015 Physical Exam  Constitutional: She appears well-developed and well-nourished. No distress.  Nontoxic appearing  HENT:  Head: Normocephalic and atraumatic.  Eyes: Conjunctivae are normal. Right eye exhibits no discharge. Left eye exhibits no discharge. No scleral icterus.  Neck: Normal range of motion. Neck supple.  Cardiovascular: Normal rate and regular  rhythm.   Pulmonary/Chest: Effort normal. No respiratory distress.  Abdominal: Soft. Normal appearance. There is tenderness in the suprapubic area. There is CVA tenderness (left). There is no rigidity, no rebound and no guarding.  Musculoskeletal: Normal range of motion.  Neurological: She is alert. Coordination normal.  Skin: Skin is warm and dry. She is not diaphoretic.  Psychiatric: She has a normal mood and affect. Her behavior is normal.  Nursing note and vitals reviewed.   ED Course  Procedures (including critical  care time) Labs Review Labs Reviewed  URINALYSIS, ROUTINE W REFLEX MICROSCOPIC (NOT AT [facility]) - Abnormal; Notable for the following:    Nitrite POSITIVE (*)    Leukocytes, UA SMALL (*)    All other components within normal limits  URINE MICROSCOPIC-ADD ON - Abnormal; Notable for the following:    Squamous Epithelial / LPF 0-5 (*)    Bacteria, UA MANY (*)    All other components within normal limits  POC URINE PREG, ED    Imaging Review No results found. I have personally reviewed and evaluated these images and lab results as part of my medical decision-making.   EKG Interpretation None      MDM   Final diagnoses:  Pyelonephritis   Pt presenting with dysuria, suprapubic discomfort, left-sided flank pain and nausea. Hypertensive to 142/100. Pt states she has known HTN but does not take medications or follow with a PCP for it. Abdomen is soft with suprapubic tenderness. No peritoneal signs. Left sided CVA tenderness present. UA positive for leukocytes, nitrites and bacteria. Pt has been diagnosed with pyelonephritis. Pain and nausea controlled in ED. Will discharge with cipro, zofran and instructions to follow up with PCP if symptoms persist. Strongly encouraged PCP follow up for her HTN. Return precautions given in discharge paperwork and discussed with pt at bedside. Pt stable for discharge      Alveta Heimlich, PA-C 10/15/15 1034  Arby Barrette, MD 10/15/15 1609

## 2015-10-15 NOTE — ED Notes (Signed)
Pt reports lower back pain and increase pain with urination and increase frequency. Denies blood in urine. Reports some nausea.

## 2015-10-15 NOTE — ED Notes (Signed)
Bed: WA07 Expected date:  Expected time:  Means of arrival:  Comments: 

## 2015-10-15 NOTE — Discharge Instructions (Signed)
Schedule a follow up appointment with community health and wellness if symptoms persist.    Pyelonephritis, Adult Pyelonephritis is a kidney infection. The kidneys are the organs that filter a person's blood and move waste out of the bloodstream and into the urine. Urine passes from the kidneys, through the ureters, and into the bladder. There are two main types of pyelonephritis:  Infections that come on quickly without any warning (acute pyelonephritis).  Infections that last for a long period of time (chronic pyelonephritis). In most cases, the infection clears up with treatment and does not cause further problems. More severe infections or chronic infections can sometimes spread to the bloodstream or lead to other problems with the kidneys. CAUSES This condition is usually caused by:  Bacteria traveling from the bladder to the kidney through infected urine. The urine in the bladder can become infected with bacteria from:  Bladder infection (cystitis).  Inflammation of the prostate gland (prostatitis).  Sexual intercourse, in females.  Bacteria traveling from the bloodstream to the kidney. RISK FACTORS This condition is more likely to develop in:  Pregnant women.  Older people.  People who have diabetes.  People who have kidney stones or bladder stones.  People who have other abnormalities of the kidney or ureter.  People who have a catheter placed in the bladder.  People who have cancer.  People who are sexually active.  Women who use spermicides.  People who have had a prior urinary tract infection. SYMPTOMS Symptoms of this condition include:  Frequent urination.  Strong or persistent urge to urinate.  Burning or stinging when urinating.  Abdominal pain.  Back pain.  Pain in the side or flank area.  Fever.  Chills.  Blood in the urine, or dark urine.  Nausea.  Vomiting. DIAGNOSIS This condition may be diagnosed based on:  Medical history  and physical exam.  Urine tests.  Blood tests. You may also have imaging tests of the kidneys, such as an ultrasound or CT scan. TREATMENT Treatment for this condition may depend on the severity of the infection.  If the infection is mild and is found early, you may be treated with antibiotic medicines taken by mouth. You will need to drink fluids to remain hydrated.  If the infection is more severe, you may need to stay in the hospital and receive antibiotics given directly into a vein through an IV tube. You may also need to receive fluids through an IV tube if you are not able to remain hydrated. After your hospital stay, you may need to take oral antibiotics for a period of time. Other treatments may be required, depending on the cause of the infection. HOME CARE INSTRUCTIONS Medicines  Take over-the-counter and prescription medicines only as told by your health care provider.  If you were prescribed an antibiotic medicine, take it as told by your health care provider. Do not stop taking the antibiotic even if you start to feel better. General Instructions  Drink enough fluid to keep your urine clear or pale yellow.  Avoid caffeine, tea, and carbonated beverages. They tend to irritate the bladder.  Urinate often. Avoid holding in urine for long periods of time.  Urinate before and after sex.  After a bowel movement, women should cleanse from front to back. Use each tissue only once.  Keep all follow-up visits as told by your health care provider. This is important. SEEK MEDICAL CARE IF:  Your symptoms do not get better after 2 days of treatment.  Your symptoms get worse.  You have a fever. SEEK IMMEDIATE MEDICAL CARE IF:  You are unable to take your antibiotics or fluids.  You have shaking chills.  You vomit.  You have severe flank or back pain.  You have extreme weakness or fainting.   This information is not intended to replace advice given to you by your  health care provider. Make sure you discuss any questions you have with your health care provider.   Document Released: 09/08/2005 Document Revised: 05/30/2015 Document Reviewed: 01/01/2015 Elsevier Interactive Patient Education [employer]

## 2015-10-24 ENCOUNTER — Emergency Department (HOSPITAL_COMMUNITY)
Admission: EM | Admit: 2015-10-24 | Discharge: 2015-10-24 | Disposition: A | Discharge status: Home / Self Care | Payer: 59 | Attending: Emergency Medicine | Admitting: Emergency Medicine

## 2015-10-24 ENCOUNTER — Encounter (HOSPITAL_COMMUNITY): Payer: Self-pay | Admitting: Emergency Medicine

## 2015-10-24 ENCOUNTER — Emergency Department (HOSPITAL_COMMUNITY): Payer: 59

## 2015-10-24 DIAGNOSIS — F17210 Nicotine dependence, cigarettes, uncomplicated: Secondary | ICD-10-CM | POA: Insufficient documentation

## 2015-10-24 DIAGNOSIS — J029 Acute pharyngitis, unspecified: Secondary | ICD-10-CM | POA: Diagnosis present

## 2015-10-24 DIAGNOSIS — Z3202 Encounter for pregnancy test, result negative: Secondary | ICD-10-CM | POA: Diagnosis not present

## 2015-10-24 DIAGNOSIS — Z8742 Personal history of other diseases of the female genital tract: Secondary | ICD-10-CM | POA: Diagnosis not present

## 2015-10-24 DIAGNOSIS — B9789 Other viral agents as the cause of diseases classified elsewhere: Secondary | ICD-10-CM

## 2015-10-24 DIAGNOSIS — Z79899 Other long term (current) drug therapy: Secondary | ICD-10-CM | POA: Diagnosis not present

## 2015-10-24 DIAGNOSIS — Z793 Long term (current) use of hormonal contraceptives: Secondary | ICD-10-CM | POA: Insufficient documentation

## 2015-10-24 DIAGNOSIS — Z8619 Personal history of other infectious and parasitic diseases: Secondary | ICD-10-CM | POA: Diagnosis not present

## 2015-10-24 DIAGNOSIS — I10 Essential (primary) hypertension: Secondary | ICD-10-CM | POA: Insufficient documentation

## 2015-10-24 DIAGNOSIS — R112 Nausea with vomiting, unspecified: Secondary | ICD-10-CM | POA: Insufficient documentation

## 2015-10-24 DIAGNOSIS — Z87440 Personal history of urinary (tract) infections: Secondary | ICD-10-CM | POA: Diagnosis not present

## 2015-10-24 DIAGNOSIS — J069 Acute upper respiratory infection, unspecified: Secondary | ICD-10-CM | POA: Diagnosis not present

## 2015-10-24 LAB — URINALYSIS, ROUTINE W REFLEX MICROSCOPIC
Bilirubin Urine: NEGATIVE
Glucose, UA: NEGATIVE mg/dL
Ketones, ur: NEGATIVE mg/dL
Leukocytes, UA: NEGATIVE
Nitrite: NEGATIVE
Protein, ur: NEGATIVE mg/dL
Specific Gravity, Urine: 1.019 (ref 1.005–1.030)
pH: 5.5 (ref 5.0–8.0)

## 2015-10-24 LAB — CBC WITH DIFFERENTIAL/PLATELET
Basophils Absolute: 0 10*3/uL (ref 0.0–0.1)
Basophils Relative: 0 %
Eosinophils Absolute: 0.3 10*3/uL (ref 0.0–0.7)
Eosinophils Relative: 5 %
HCT: 39.6 % (ref 36.0–46.0)
Hemoglobin: 13.2 g/dL (ref 12.0–15.0)
Lymphocytes Relative: 32 %
Lymphs Abs: 1.5 10*3/uL (ref 0.7–4.0)
MCH: 26 pg (ref 26.0–34.0)
MCHC: 33.3 g/dL (ref 30.0–36.0)
MCV: 78 fL (ref 78.0–100.0)
Monocytes Absolute: 0.8 10*3/uL (ref 0.1–1.0)
Monocytes Relative: 16 %
Neutro Abs: 2.2 10*3/uL (ref 1.7–7.7)
Neutrophils Relative %: 47 %
Platelets: 256 10*3/uL (ref 150–400)
RBC: 5.08 MIL/uL (ref 3.87–5.11)
RDW: 15.9 % — ABNORMAL HIGH (ref 11.5–15.5)
WBC: 4.7 10*3/uL (ref 4.0–10.5)

## 2015-10-24 LAB — BASIC METABOLIC PANEL
Anion gap: 8 (ref 5–15)
BUN: 10 mg/dL (ref 6–20)
CO2: 26 mmol/L (ref 22–32)
Calcium: 9.3 mg/dL (ref 8.9–10.3)
Chloride: 104 mmol/L (ref 101–111)
Creatinine, Ser: 0.88 mg/dL (ref 0.44–1.00)
GFR calc Af Amer: >60 mL/min (ref >60)
GFR calc non Af Amer: >60 mL/min (ref >60)
Glucose, Bld: 94 mg/dL (ref 65–99)
Potassium: 4 mmol/L (ref 3.5–5.1)
Sodium: 138 mmol/L (ref 135–145)

## 2015-10-24 LAB — URINE MICROSCOPIC-ADD ON

## 2015-10-24 LAB — RAPID STREP SCREEN (MED CTR MEBANE ONLY): STREPTOCOCCUS, GROUP A SCREEN (DIRECT): NEGATIVE

## 2015-10-24 LAB — PREGNANCY, URINE: Preg Test, Ur: NEGATIVE

## 2015-10-24 MED ORDER — SODIUM CHLORIDE 0.9 % IV BOLUS (SEPSIS)
1000.0000 mL | Freq: Once | INTRAVENOUS | Status: AC
  Administered 2015-10-24: 1000 mL via INTRAVENOUS

## 2015-10-24 MED ORDER — KETOROLAC TROMETHAMINE 30 MG/ML IJ SOLN
30.0000 mg | Freq: Once | INTRAMUSCULAR | Status: AC
  Administered 2015-10-24: 30 mg via INTRAVENOUS
  Filled 2015-10-24: qty 1

## 2015-10-24 MED ORDER — GUAIFENESIN ER 1200 MG PO TB12: 1.0000 | ORAL_TABLET | Freq: Two times a day (BID) | ORAL | Status: DC

## 2015-10-24 MED ORDER — ACETAMINOPHEN-CODEINE 120-12 MG/5ML PO SOLN: 10.0000 mL | ORAL | Status: DC | PRN

## 2015-10-24 NOTE — Discharge Instructions (Signed)
Return here as needed.  Follow-up with your primary care Dr. increase her fluid intake and rest as much as possible °

## 2015-10-24 NOTE — ED Notes (Signed)
Pt reports cough x 3 days with sore throat, generalized body aches and ear fullness starting 2 days ago. Pt also reports nausea and vomiting. PT reports recent UTI with antibiotic treatment. Pt alert x4.

## 2015-10-24 NOTE — ED Provider Notes (Signed)
CSN: [id number]     Arrival date & time 10/24/15  0865 History   First MD Initiated Contact with Patient 10/24/15 0813     Chief Complaint  Patient presents with  . Generalized Body Aches  . Sore Throat     (Consider location/radiation/quality/duration/timing/severity/associated sxs/prior Treatment) HPI Patient presents to the emergency department with cough for the last 3 days with sore throat and generalized body aches.  Patient states that she has also had some nausea with vomiting but mainly after coughing.  The patient states that she was recently placed on antibiotics for urinary tract infection.  She states that she has not have any chest pain, shortness breath, weakness, dizziness, headache, blurred vision, dysuria, incontinence, bloody stool, hematemesis, abdominal pain, rash, near syncope or syncope.  Patient states she did not take any medications prior to arrival.  Nothing seems make her condition better or worse Past Medical History  Diagnosis Date  . UTI (lower urinary tract infection)   . BV (bacterial vaginosis)   . UTI (lower urinary tract infection)   . Hypertension   . Kidney infection    Past Surgical History  Procedure Laterality Date  . Cesarean section      x3   Family History  Problem Relation Age of Onset  . Hypertension Mother   . Heart disease Father   . Asthma Brother    Social History  Substance Use Topics  . Smoking status: Current Every Day Smoker -- 0.25 packs/day for 7 years    Types: Cigarettes  . Smokeless tobacco: Current User  . Alcohol Use: No   OB History    Gravida Para Term Preterm AB TAB SAB Ectopic Multiple Living   0 0 3     Review of Systems All other systems negative except as documented in the HPI. All pertinent positives and negatives as reviewed in the HPI.   Allergies  Review of patient's allergies indicates no known allergies.  Home Medications   Prior to Admission medications   Medication Sig Start  Date End Date Taking? Authorizing Provider  ibuprofen (ADVIL,MOTRIN) 200 MG tablet Take 400 mg by mouth every 6 (six) hours as needed for moderate pain. Reported on 09/28/2015   Yes Historical Provider, MD  ciprofloxacin (CIPRO) 500 MG tablet Take 1 tablet (500 mg total) by mouth 2 (two) times daily. Patient taking differently: Take 500 mg by mouth 2 (two) times daily. Started on 10-15-15 for 7 days. Completed on 10-22-15 10/15/15   Alveta Heimlich, PA-C  etonogestrel-ethinyl estradiol (NUVARING) 0.12-0.015 MG/24HR vaginal ring Insert vaginally and leave in place for 3 consecutive weeks, then remove for 1 week. 10/03/15   Rhona Raider Stinson, DO  ondansetron (ZOFRAN) 4 MG tablet Take 1 tablet (4 mg total) by mouth every 6 (six) hours. 10/15/15   Stevi Barrett, PA-C   BP 146/98 mmHg  Pulse 78  Temp(Src) 98.2 F (36.8 C) (Oral)  Resp 16  SpO2 100%  LMP 10/07/2015 Physical Exam  Constitutional: She is oriented to person, place, and time. She appears well-developed and well-nourished. No distress.  HENT:  Head: Normocephalic and atraumatic.  Mouth/Throat: Oropharynx is clear and moist. No oropharyngeal exudate.  Eyes: Pupils are equal, round, and reactive to light.  Neck: Normal range of motion. Neck supple.  Cardiovascular: Normal rate, regular rhythm and normal heart sounds.  Exam reveals no gallop and no friction rub.   No murmur heard. Pulmonary/Chest: Effort normal and breath sounds normal.  No respiratory distress. She has no wheezes.  Abdominal: Soft. Bowel sounds are normal. She exhibits no distension. There is no tenderness.  Neurological: She is alert and oriented to person, place, and time. She exhibits normal muscle tone. Coordination normal.  Skin: Skin is warm and dry. No rash noted. No erythema.  Psychiatric: She has a normal mood and affect. Her behavior is normal.  Nursing note and vitals reviewed.   ED Course  Procedures (including critical care time) Labs Review Labs Reviewed   URINALYSIS, ROUTINE W REFLEX MICROSCOPIC (NOT AT [facility]) - Abnormal; Notable for the following:    APPearance CLOUDY (*)    Hgb urine dipstick MODERATE (*)    All other components within normal limits  CBC WITH DIFFERENTIAL/PLATELET - Abnormal; Notable for the following:    RDW 15.9 (*)    All other components within normal limits  URINE MICROSCOPIC-ADD ON - Abnormal; Notable for the following:    Squamous Epithelial / LPF 0-5 (*)    Bacteria, UA RARE (*)    All other components within normal limits  RAPID STREP SCREEN (NOT AT [facility])  CULTURE, GROUP A STREP [facility])  URINE CULTURE  PREGNANCY, URINE  BASIC METABOLIC PANEL    Imaging Review Dg Chest 2 View  10/24/2015  CLINICAL DATA:  Worsening productive cough. EXAM: CHEST  2 VIEW COMPARISON:  11/06/2013 FINDINGS: The cardiac silhouette is enlarged. Mediastinal contours appear intact. There is no evidence of focal airspace consolidation, pleural effusion or pneumothorax. Osseous structures are without acute abnormality. Soft tissues are grossly normal. IMPRESSION: Enlarged cardiac silhouette. No evidence no evidence of focal airspace consolidation. Electronically Signed   By: Ted Mcalpine M.D.   On: 10/24/2015 09:06   I have personally reviewed and evaluated these images and lab results as part of my medical decision-making.  Return here as needed.  Follow-up with your primary care Dr. increase her fluid intake and rest as much as possible.  Tylenol and Motrin for fever    Charlestine Night, PA-C 10/26/15 1634  Vanetta Mulders, MD 10/26/15 2239

## 2015-10-25 LAB — URINE CULTURE

## 2015-10-26 LAB — CULTURE, GROUP A STREP (THRC)

## 2015-12-01 ENCOUNTER — Emergency Department (HOSPITAL_COMMUNITY)
Admission: EM | Admit: 2015-12-01 | Discharge: 2015-12-01 | Disposition: A | Discharge status: Home / Self Care | Payer: 59 | Attending: Emergency Medicine | Admitting: Emergency Medicine

## 2015-12-01 ENCOUNTER — Encounter (HOSPITAL_COMMUNITY): Payer: Self-pay | Admitting: Emergency Medicine

## 2015-12-01 DIAGNOSIS — M79662 Pain in left lower leg: Secondary | ICD-10-CM | POA: Insufficient documentation

## 2015-12-01 DIAGNOSIS — R200 Anesthesia of skin: Secondary | ICD-10-CM | POA: Diagnosis not present

## 2015-12-01 DIAGNOSIS — Z79899 Other long term (current) drug therapy: Secondary | ICD-10-CM | POA: Diagnosis not present

## 2015-12-01 DIAGNOSIS — R600 Localized edema: Secondary | ICD-10-CM | POA: Diagnosis not present

## 2015-12-01 DIAGNOSIS — F17210 Nicotine dependence, cigarettes, uncomplicated: Secondary | ICD-10-CM | POA: Diagnosis not present

## 2015-12-01 DIAGNOSIS — M79605 Pain in left leg: Secondary | ICD-10-CM

## 2015-12-01 DIAGNOSIS — Z87448 Personal history of other diseases of urinary system: Secondary | ICD-10-CM | POA: Insufficient documentation

## 2015-12-01 DIAGNOSIS — I10 Essential (primary) hypertension: Secondary | ICD-10-CM | POA: Diagnosis not present

## 2015-12-01 DIAGNOSIS — Z8742 Personal history of other diseases of the female genital tract: Secondary | ICD-10-CM | POA: Diagnosis not present

## 2015-12-01 DIAGNOSIS — Z87440 Personal history of urinary (tract) infections: Secondary | ICD-10-CM | POA: Diagnosis not present

## 2015-12-01 LAB — CBC WITH DIFFERENTIAL/PLATELET
BASOS PCT: 0 %
Basophils Absolute: 0 10*3/uL (ref 0.0–0.1)
EOS ABS: 0.2 10*3/uL (ref 0.0–0.7)
Eosinophils Relative: 3 %
HCT: 36.9 % (ref 36.0–46.0)
HEMOGLOBIN: 12.7 g/dL (ref 12.0–15.0)
Lymphocytes Relative: 43 %
Lymphs Abs: 3.1 10*3/uL (ref 0.7–4.0)
MCH: 26 pg (ref 26.0–34.0)
MCHC: 34.4 g/dL (ref 30.0–36.0)
MCV: 75.6 fL — ABNORMAL LOW (ref 78.0–100.0)
Monocytes Absolute: 0.6 10*3/uL (ref 0.1–1.0)
Monocytes Relative: 9 %
NEUTROS PCT: 45 %
Neutro Abs: 3.3 10*3/uL (ref 1.7–7.7)
Platelets: 314 10*3/uL (ref 150–400)
RBC: 4.88 MIL/uL (ref 3.87–5.11)
RDW: 15.7 % — ABNORMAL HIGH (ref 11.5–15.5)
WBC: 7.3 10*3/uL (ref 4.0–10.5)

## 2015-12-01 LAB — BASIC METABOLIC PANEL
Anion gap: 9 (ref 5–15)
BUN: 13 mg/dL (ref 6–20)
CHLORIDE: 103 mmol/L (ref 101–111)
CO2: 27 mmol/L (ref 22–32)
CREATININE: 0.99 mg/dL (ref 0.44–1.00)
Calcium: 9.6 mg/dL (ref 8.9–10.3)
Glucose, Bld: 100 mg/dL — ABNORMAL HIGH (ref 65–99)
POTASSIUM: 4.1 mmol/L (ref 3.5–5.1)
SODIUM: 139 mmol/L (ref 135–145)

## 2015-12-01 LAB — D-DIMER, QUANTITATIVE (NOT AT ARMC): D DIMER QUANT: 0.41 ug{FEU}/mL (ref 0.00–0.50)

## 2015-12-01 MED ORDER — OXYCODONE-ACETAMINOPHEN 5-325 MG PO TABS
2.0000 | ORAL_TABLET | Freq: Once | ORAL | Status: AC
  Administered 2015-12-01: 2 via ORAL
  Filled 2015-12-01: qty 2

## 2015-12-01 MED ORDER — OXYCODONE-ACETAMINOPHEN 5-325 MG PO TABS: 1.0000 | ORAL_TABLET | ORAL | Status: DC | PRN

## 2015-12-01 NOTE — ED Notes (Signed)
Patient presents for left lower leg pain x2 weeks. Pain is worse on lateral left calf and anterior ankle. Patient reports knot in left lateral calf, no knot noted upon palpation, limb is warm to touch, pedal pulses intact.

## 2015-12-01 NOTE — ED Notes (Signed)
Pt states she has a ride home. 

## 2015-12-01 NOTE — ED Notes (Signed)
Pt ambulated to exam room from triage by triage tech. Gait steady.

## 2015-12-01 NOTE — ED Provider Notes (Signed)
CSN: [id number]     Arrival date & time 12/01/15  1733 History  By signing my name below, I, Linna DarnerRussell Turner, attest that this documentation has been prepared under the direction and in the presence of non-physician practitioner, Glean HessElizabeth Westfall, PA-C. Electronically Signed: Linna Darnerussell Turner, Scribe. 12/01/2015. 7:05 PM.    Chief Complaint  Patient presents with  . Leg Pain    The history is provided by the patient. No language interpreter was used.     HPI Comments: Lisa Vazquez is a 30 y.o. female with PMHx of HTN who presents to the Emergency Department complaining of sudden onset, intermittent, left calf pain for the last two weeks. She notes that her left calf pain has been constant and worsening for the last couple of days. She states that every type of movement/position makes her pain worse and notes no alleviating factors; she has tried lidocaine patches, icy hot, and various topical creams. She also endorses left ankle pain/swelling as well as pain exacerbation with palpation to her outer left ankle. She notes numbness in her left toes. She denies any recent injury. 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 She denies recent travel, surgery, immobility, or h/o blood clots. She denies shortness of breath or any other associated symptoms.   Past Medical History  Diagnosis Date  . UTI (lower urinary tract infection)   . BV (bacterial vaginosis)   . UTI (lower urinary tract infection)   . Hypertension   . Kidney infection    Past Surgical History  Procedure Laterality Date  . Cesarean section      x3   Family History  Problem Relation Age of Onset  . Hypertension Mother   . Heart disease Father   . Asthma Brother    Social History  Substance Use Topics  . Smoking status: Current Every Day Smoker -- 0.25 packs/day for 7 years    Types: Cigarettes  . Smokeless tobacco: Current User  . Alcohol Use: No   OB History    Gravida Para Term Preterm AB TAB  SAB Ectopic Multiple Living   4 3 1 2 1 1   0 0 3      Review of Systems  Respiratory: Negative for shortness of breath.   Musculoskeletal: Positive for arthralgias.  Neurological: Positive for numbness.      Allergies  Review of patient's allergies indicates no known allergies.  Home Medications   Prior to Admission medications   Medication Sig Start Date End Date Taking? Authorizing Provider  acetaminophen-codeine 120-12 MG/5ML solution Take 10 mLs by mouth every 4 (four) hours as needed for moderate pain. 10/24/15   Charlestine Nighthristopher Lawyer, PA-C  ciprofloxacin (CIPRO) 500 MG tablet Take 1 tablet (500 mg total) by mouth 2 (two) times daily. Patient taking differently: Take 500 mg by mouth 2 (two) times daily. Started on 10-15-15 for 7 days. Completed on 10-22-15 10/15/15   Alveta HeimlichStevi Barrett, PA-C  etonogestrel-ethinyl estradiol (NUVARING) 0.12-0.015 MG/24HR vaginal ring Insert vaginally and leave in place for 3 consecutive weeks, then remove for 1 week. 10/03/15   Rhona RaiderJacob J Stinson, DO  Guaifenesin 1200 MG TB12 Take 1 tablet (1,200 mg total) by mouth 2 (two) times daily. 10/24/15   Charlestine Nighthristopher Lawyer, PA-C  ibuprofen (ADVIL,MOTRIN) 200 MG tablet Take 400 mg by mouth every 6 (six) hours as needed for moderate pain. Reported on 09/28/2015    Historical Provider, MD  ondansetron (ZOFRAN) 4 MG tablet Take 1 tablet (4 mg  total) by mouth every 6 (six) hours. 10/15/15   Stevi Barrett, PA-C  oxyCODONE-acetaminophen (PERCOCET/ROXICET) 5-325 MG tablet Take 1 tablet by mouth every 4 (four) hours as needed for severe pain. 12/01/15   Mady Gemma, PA-C    BP 136/91 mmHg  Pulse 74  Temp(Src) 98.2 F (36.8 C) (Oral)  Resp 18  SpO2 98%  LMP 10/27/2015 Physical Exam  Constitutional: She is oriented to person, place, and time. She appears well-developed and well-nourished. No distress.  HENT:  Head: Normocephalic and atraumatic.  Right Ear: External ear normal.  Left Ear: External ear normal.  Nose: Nose  normal.  Mouth/Throat: Uvula is midline, oropharynx is clear and moist and mucous membranes are normal.  Eyes: Conjunctivae, EOM and lids are normal. Pupils are equal, round, and reactive to light. Right eye exhibits no discharge. Left eye exhibits no discharge. No scleral icterus.  Neck: Normal range of motion. Neck supple.  Cardiovascular: Normal rate, regular rhythm, normal heart sounds, intact distal pulses and normal pulses.   Pulmonary/Chest: Effort normal and breath sounds normal. No respiratory distress. She has no wheezes. She has no rales.  Abdominal: Soft. Normal appearance and bowel sounds are normal. She exhibits no distension and no mass. There is no tenderness. There is no rigidity, no rebound and no guarding.  Musculoskeletal: Normal range of motion. She exhibits edema and tenderness.  Mild diffuse TTP to left posterior calf and left anterior ankle with associated edema and minimal erythema. No palpable cords. No heat. Full ROM.  Neurological: She is alert and oriented to person, place, and time. She has normal strength. No sensory deficit.  Skin: Skin is warm, dry and intact. No rash noted. She is not diaphoretic. No erythema. No pallor.  Psychiatric: She has a normal mood and affect. Her speech is normal and behavior is normal.  Nursing note and vitals reviewed.   ED Course  Procedures (including critical care time)  DIAGNOSTIC STUDIES: Oxygen Saturation is 100% on RA, normal by my interpretation.    COORDINATION OF CARE: 7:05 PM Will administer oxycodone 2 tablets. Will order blood work. Discussed treatment plan with pt at bedside and pt agreed to plan.   Labs Review Labs Reviewed  CBC WITH DIFFERENTIAL/PLATELET - Abnormal; Notable for the following:    MCV 75.6 (*)    RDW 15.7 (*)    All other components within normal limits  BASIC METABOLIC PANEL - Abnormal; Notable for the following:    Glucose, Bld 100 (*)    All other components within normal limits  D-DIMER,  QUANTITATIVE (NOT AT [facility])    Imaging Review No results found.   I have personally reviewed and evaluated these lab results as part of my medical decision-making.   EKG Interpretation None      MDM   Final diagnoses:  Left leg pain    30 year old female presents with left lower extremity pain and swelling x 2 weeks.  Patient is afebrile. Vital signs stable. On exam, patient has mild diffuse TTP there o left posterior calf and left anterior ankle with associated edema and minimal erythema. No palpable cords. No heat. Full ROM. Patient is NVI.   Patient given pain medication, and reports subsequent symptom improvement. Patient discussed with Dr. Fayrene Fearing. Will obtain basic labs and d-dimer.   CBC negative for leukocytosis or anemia. BMP unremarkable. D-dimer negative.  Presentation does not seem consistent with cellulitis. No significant DVT risk factors and d-dimer negative, however advised patient to follow-up  for outpatient [place] (not available at this hour, however ordered in the ED today). Return precautions discussed. Patient verbalizes her understanding and is in agreement with plan.  BP 136/91 mmHg  Pulse 74  Temp(Src) 98.2 F (36.8 C) (Oral)  Resp 18  SpO2 98%  LMP 10/27/2015  I personally performed the services described in this documentation, which was scribed in my presence. The recorded information has been reviewed and is accurate.       Mady Gemma, PA-C 12/02/15 1242  Rolland Porter, MD 12/10/15 1451

## 2015-12-01 NOTE — Discharge Instructions (Signed)
1. Medications: percocet for pain, usual home medications 2. Treatment: rest, drink plenty of fluids 3. Follow Up: please followup with your primary doctor for discussion of your diagnoses and further evaluation after today's visit; if you do not have a primary care doctor use the phone number listed in your discharge paperwork to find one; please return to the ER for new or worsening symptoms   Musculoskeletal Pain Musculoskeletal pain is muscle and boney aches and pains. These pains can occur in any part of the body. Your caregiver may treat you without knowing the cause of the pain. They may treat you if blood or urine tests, X-rays, and other tests were normal.  CAUSES There is often not a definite cause or reason for these pains. These pains may be caused by a type of germ (virus). The discomfort may also come from overuse. Overuse includes working out too hard when your body is not fit. Boney aches also come from weather changes. Bone is sensitive to atmospheric pressure changes. HOME CARE INSTRUCTIONS   Ask when your test results will be ready. Make sure you get your test results.  Only take over-the-counter or prescription medicines for pain, discomfort, or fever as directed by your caregiver. If you were given medications for your condition, do not drive, operate machinery or power tools, or sign legal documents for 24 hours. Do not drink alcohol. Do not take sleeping pills or other medications that may interfere with treatment.  Continue all activities unless the activities cause more pain. When the pain lessens, slowly resume normal activities. Gradually increase the intensity and duration of the activities or exercise.  During periods of severe pain, bed rest may be helpful. Lay or sit in any position that is comfortable.  Putting ice on the injured area.  Put ice in a bag.  Place a towel between your skin and the bag.  Leave the ice on for 15 to 20 minutes, 3 to 4 times a  day.  Follow up with your caregiver for continued problems and no reason can be found for the pain. If the pain becomes worse or does not go away, it may be necessary to repeat tests or do additional testing. Your caregiver may need to look further for a possible cause. SEEK IMMEDIATE MEDICAL CARE IF:  You have pain that is getting worse and is not relieved by medications.  You develop chest pain that is associated with shortness or breath, sweating, feeling sick to your stomach (nauseous), or throw up (vomit).  Your pain becomes localized to the abdomen.  You develop any new symptoms that seem different or that concern you. MAKE SURE YOU:   Understand these instructions.  Will watch your condition.  Will get help right away if you are not doing well or get worse.   This information is not intended to replace advice given to you by your health care provider. Make sure you discuss any questions you have with your health care provider.   Document Released: 09/08/2005 Document Revised: 12/01/2011 Document Reviewed: 05/13/2013 Elsevier Interactive Patient Education [employer]2016 Elsevier Inc.

## 2015-12-03 ENCOUNTER — Ambulatory Visit (HOSPITAL_COMMUNITY)
Admission: RE | Admit: 2015-12-03 | Discharge: 2015-12-03 | Disposition: A | Discharge status: Home / Self Care | Payer: 59 | Source: Ambulatory Visit | Attending: Emergency Medicine | Admitting: Emergency Medicine

## 2015-12-03 DIAGNOSIS — M7989 Other specified soft tissue disorders: Secondary | ICD-10-CM | POA: Diagnosis not present

## 2015-12-03 DIAGNOSIS — M79609 Pain in unspecified limb: Secondary | ICD-10-CM

## 2015-12-03 DIAGNOSIS — M79605 Pain in left leg: Secondary | ICD-10-CM | POA: Insufficient documentation

## 2015-12-03 NOTE — Progress Notes (Signed)
Preliminary results by tech - Left Lower Ext. Venous Duplex Completed. Negative for deep and superficial vein thrombosis in the left leg. Rae Plotner, BS, RDMS, RVT  

## 2015-12-10 ENCOUNTER — Inpatient Hospital Stay: Payer: 59 | Admitting: Internal Medicine

## 2015-12-18 ENCOUNTER — Encounter (HOSPITAL_COMMUNITY): Payer: Self-pay | Admitting: Family Medicine

## 2015-12-18 ENCOUNTER — Emergency Department (HOSPITAL_COMMUNITY)
Admission: EM | Admit: 2015-12-18 | Discharge: 2015-12-18 | Disposition: A | Discharge status: Home / Self Care | Payer: 59 | Attending: Emergency Medicine | Admitting: Emergency Medicine

## 2015-12-18 ENCOUNTER — Emergency Department (HOSPITAL_COMMUNITY): Payer: 59

## 2015-12-18 DIAGNOSIS — I10 Essential (primary) hypertension: Secondary | ICD-10-CM | POA: Diagnosis not present

## 2015-12-18 DIAGNOSIS — Z792 Long term (current) use of antibiotics: Secondary | ICD-10-CM | POA: Insufficient documentation

## 2015-12-18 DIAGNOSIS — M545 Low back pain: Secondary | ICD-10-CM | POA: Diagnosis not present

## 2015-12-18 DIAGNOSIS — Z793 Long term (current) use of hormonal contraceptives: Secondary | ICD-10-CM | POA: Insufficient documentation

## 2015-12-18 DIAGNOSIS — M79662 Pain in left lower leg: Secondary | ICD-10-CM | POA: Insufficient documentation

## 2015-12-18 DIAGNOSIS — F17210 Nicotine dependence, cigarettes, uncomplicated: Secondary | ICD-10-CM | POA: Diagnosis not present

## 2015-12-18 DIAGNOSIS — R05 Cough: Secondary | ICD-10-CM | POA: Insufficient documentation

## 2015-12-18 DIAGNOSIS — Z87440 Personal history of urinary (tract) infections: Secondary | ICD-10-CM | POA: Insufficient documentation

## 2015-12-18 DIAGNOSIS — Z8619 Personal history of other infectious and parasitic diseases: Secondary | ICD-10-CM | POA: Diagnosis not present

## 2015-12-18 DIAGNOSIS — M79602 Pain in left arm: Secondary | ICD-10-CM | POA: Diagnosis not present

## 2015-12-18 DIAGNOSIS — Z79899 Other long term (current) drug therapy: Secondary | ICD-10-CM | POA: Insufficient documentation

## 2015-12-18 DIAGNOSIS — Z87448 Personal history of other diseases of urinary system: Secondary | ICD-10-CM | POA: Diagnosis not present

## 2015-12-18 DIAGNOSIS — Z8742 Personal history of other diseases of the female genital tract: Secondary | ICD-10-CM | POA: Diagnosis not present

## 2015-12-18 DIAGNOSIS — R079 Chest pain, unspecified: Secondary | ICD-10-CM | POA: Diagnosis not present

## 2015-12-18 LAB — CBC WITH DIFFERENTIAL/PLATELET
BASOS ABS: 0 10*3/uL (ref 0.0–0.1)
BASOS PCT: 0 %
Eosinophils Absolute: 0.2 10*3/uL (ref 0.0–0.7)
Eosinophils Relative: 3 %
HEMATOCRIT: 37.8 % (ref 36.0–46.0)
HEMOGLOBIN: 12.6 g/dL (ref 12.0–15.0)
LYMPHS PCT: 45 %
Lymphs Abs: 3.3 10*3/uL (ref 0.7–4.0)
MCH: 25.9 pg — ABNORMAL LOW (ref 26.0–34.0)
MCHC: 33.3 g/dL (ref 30.0–36.0)
MCV: 77.6 fL — ABNORMAL LOW (ref 78.0–100.0)
Monocytes Absolute: 0.9 10*3/uL (ref 0.1–1.0)
Monocytes Relative: 12 %
Neutro Abs: 2.9 10*3/uL (ref 1.7–7.7)
Neutrophils Relative %: 40 %
Platelets: 293 10*3/uL (ref 150–400)
RBC: 4.87 MIL/uL (ref 3.87–5.11)
RDW: 15.5 % (ref 11.5–15.5)
WBC: 7.3 10*3/uL (ref 4.0–10.5)

## 2015-12-18 LAB — BASIC METABOLIC PANEL
Anion gap: 8 (ref 5–15)
BUN: 11 mg/dL (ref 6–20)
CO2: 29 mmol/L (ref 22–32)
CREATININE: 0.97 mg/dL (ref 0.44–1.00)
Calcium: 9.5 mg/dL (ref 8.9–10.3)
Chloride: 101 mmol/L (ref 101–111)
Glucose, Bld: 78 mg/dL (ref 65–99)
POTASSIUM: 3.9 mmol/L (ref 3.5–5.1)
SODIUM: 138 mmol/L (ref 135–145)

## 2015-12-18 LAB — CK: Total CK: 160 U/L (ref 38–234)

## 2015-12-18 MED ORDER — KETOROLAC TROMETHAMINE 60 MG/2ML IM SOLN
60.0000 mg | Freq: Once | INTRAMUSCULAR | Status: AC
  Administered 2015-12-18: 60 mg via INTRAMUSCULAR
  Filled 2015-12-18: qty 2

## 2015-12-18 MED ORDER — MELOXICAM 7.5 MG PO TABS: 7.5000 mg | ORAL_TABLET | Freq: Every day | ORAL | Status: DC | PRN

## 2015-12-18 MED ORDER — METHOCARBAMOL 500 MG PO TABS: 500.0000 mg | ORAL_TABLET | Freq: Four times a day (QID) | ORAL | Status: DC | PRN

## 2015-12-18 NOTE — ED Provider Notes (Signed)
CSN: [id number]     Arrival date & time 12/18/15  0815 History   First MD Initiated Contact with Patient 12/18/15 1035     Chief Complaint  Patient presents with  . Leg Pain     (Consider location/radiation/quality/duration/timing/severity/associated sxs/prior Treatment) The history is provided by the patient.     Pt presents with persistent left leg pain that began 1 month ago.  The pain is located in her left calf and feels like stabbing, is constant.  She occasionally has numbness in her left 3rd-5th toes.  She has had some low back pain.  Also has sharp shooting pains into her left arm and across her chest that she feels are related.  Chest pain just began last night, is sharp, lasts 1-2 minutes, occurs randomly.  She also has developed a cough.   Denies fevers, chills, neck pain, SOB, hemoptysis, weakness of the extremities.  She is taking nothing for pain, states the percocet given to her last visit only took the edge off.  Denies any heavy lifting or falls/trauma.  Denies hx CA, IVDU.   Was seen in ED 12/01/15 with negative d-dimer, had negative venous duplex DVT study on 12/03/15.    Past Medical History  Diagnosis Date  . UTI (lower urinary tract infection)   . BV (bacterial vaginosis)   . UTI (lower urinary tract infection)   . Hypertension   . Kidney infection    Past Surgical History  Procedure Laterality Date  . Cesarean section      x3   Family History  Problem Relation Age of Onset  . Hypertension Mother   . Heart disease Father   . Asthma Brother    Social History  Substance Use Topics  . Smoking status: Current Every Day Smoker -- 0.25 packs/day for 7 years    Types: Cigarettes  . Smokeless tobacco: Current User  . Alcohol Use: No   OB History    Gravida Para Term Preterm AB TAB SAB Ectopic Multiple Living   0 0 3     Review of Systems  All other systems reviewed and are negative.     Allergies  Review of patient's allergies indicates  no known allergies.  Home Medications   Prior to Admission medications   Medication Sig Start Date End Date Taking? Authorizing Provider  acetaminophen-codeine 120-12 MG/5ML solution Take 10 mLs by mouth every 4 (four) hours as needed for moderate pain. 10/24/15   Charlestine Night, PA-C  ciprofloxacin (CIPRO) 500 MG tablet Take 1 tablet (500 mg total) by mouth 2 (two) times daily. Patient taking differently: Take 500 mg by mouth 2 (two) times daily. Started on 10-15-15 for 7 days. Completed on 10-22-15 10/15/15   Alveta Heimlich, PA-C  etonogestrel-ethinyl estradiol (NUVARING) 0.12-0.015 MG/24HR vaginal ring Insert vaginally and leave in place for 3 consecutive weeks, then remove for 1 week. 10/03/15   Rhona Raider Stinson, DO  Guaifenesin 1200 MG TB12 Take 1 tablet (1,200 mg total) by mouth 2 (two) times daily. 10/24/15   Charlestine Night, PA-C  ibuprofen (ADVIL,MOTRIN) 200 MG tablet Take 400 mg by mouth every 6 (six) hours as needed for moderate pain. Reported on 09/28/2015    Historical Provider, MD  ondansetron (ZOFRAN) 4 MG tablet Take 1 tablet (4 mg total) by mouth every 6 (six) hours. 10/15/15   Stevi Barrett, PA-C  oxyCODONE-acetaminophen (PERCOCET/ROXICET) 5-325 MG tablet Take 1 tablet by mouth every 4 (four) hours as needed  for severe pain. 12/01/15   Mady GemmaElizabeth C Westfall, PA-C   BP 142/110 mmHg  Pulse 79  Temp(Src) 98.3 F (36.8 C) (Oral)  Resp 19  Ht 5\' 4"  (1.626 m)  Wt 72.576 kg  BMI 27.45 kg/m2  SpO2 98%  LMP 10/27/2015 Physical Exam  Constitutional: She appears well-developed and well-nourished. No distress.  HENT:  Head: Normocephalic and atraumatic.  Neck: Neck supple.  Cardiovascular: Normal rate and regular rhythm.   Pulmonary/Chest: Effort normal and breath sounds normal. No respiratory distress. She has no wheezes. She has no rales.  Abdominal: Soft. She exhibits no distension. There is no tenderness. There is no rebound and no guarding.  Musculoskeletal:       Left hip: She  exhibits normal range of motion and normal strength.       Left knee: She exhibits normal range of motion, no swelling and no effusion. No tenderness found.       Back:       Left lower leg: She exhibits tenderness. She exhibits no bony tenderness, no edema, no deformity and no laceration.       Legs:      Left foot: There is no tenderness, no swelling, normal capillary refill, no crepitus and no deformity.  Left calf tender to moderate to deep palpation, appears slightly larger than right calf.  NO skin changes.  Decreased strength with plantar flexion (and causes pain to attempt), otherwise strength is intact.  Achilles tendon is intact.  Decreased sensation in 3rd-5th toes.  No erythema or warmth.    No tenderness of C or T spine.  No crepitus or stepoffs throughout the spine.  No overlying skin changes.  Upper extremities:  Strength 5/5, sensation intact, distal pulses intact.     Neurological: She is alert.  Skin: She is not diaphoretic.  Nursing note and vitals reviewed.   ED Course  Procedures (including critical care time) Labs Review Labs Reviewed  CBC WITH DIFFERENTIAL/PLATELET - Abnormal; Notable for the following:    MCV 77.6 (*)    MCH 25.9 (*)    All other components within normal limits  BASIC METABOLIC PANEL  CK    Imaging Review Dg Chest 2 View  12/18/2015  CLINICAL DATA:  Left arm pain, chest pain, numbness EXAM: CHEST  2 VIEW COMPARISON:  10/24/2015 FINDINGS: Borderline cardiomegaly. No acute infiltrate or pleural effusion. No pulmonary edema. Bony thorax is unremarkable. IMPRESSION: No active cardiopulmonary disease. Electronically Signed   By: Natasha MeadLiviu  Pop M.D.   On: 12/18/2015 12:18   Dg Lumbar Spine Complete  12/18/2015  CLINICAL DATA:  Chest pain EXAM: LUMBAR SPINE - COMPLETE 4+ VIEW COMPARISON:  None FINDINGS: There is no evidence of lumbar spine fracture. Alignment is normal. Intervertebral disc spaces are maintained. IMPRESSION: Negative. Electronically  Signed   By: Signa Kellaylor  Stroud M.D.   On: 12/18/2015 12:16   Dg Tibia/fibula Left  12/18/2015  CLINICAL DATA:  Left calf pain for 1 month.  Numbness of the toes. EXAM: LEFT TIBIA AND FIBULA - 2 VIEW COMPARISON:  None. FINDINGS: There is no evidence of fracture or other focal bone lesions. Soft tissues are unremarkable. IMPRESSION: Negative. Electronically Signed   By: Marnee SpringJonathon  Watts M.D.   On: 12/18/2015 12:16   I have personally reviewed and evaluated these images and lab results as part of my medical decision-making.   EKG Interpretation   Date/Time:  Tuesday December 18 2015 11:04:03 EDT Ventricular Rate:  79 PR Interval:  162 QRS  Duration: 102 QT Interval:  407 QTC Calculation: 467 R Axis:   -77 Text Interpretation:  Sinus rhythm Left anterior fascicular block  Borderline T abnormalities, anterior leads no STEMI. no change from old  Confirmed by Donnald Garre, MD, Lebron Conners [phone]) on 12/18/2015 12:00:09 PM      MDM   Final diagnoses:  Calf pain, left    Pt with 1 month hx left calf pain without known injury.  Also has what she considers associated left arm and chest pain.  Exam significant only for pain and weakness with plantar flexion of left foot, tenderness of left calf.  Labs unremarkable.  Xrays negative.  EKG unchanged and nonischemic. Chest pain is very atypical, doubt ACS, PE, PNA, dissection.  Suspect this pain and swelling of the calf is related to the calf muscle itself, vs possible neuropathic pain.  Unclear why pt is having this.  Reassuring workup including negative lumbar spine and tib/fib xrays, normal CK, normal electrolytes.  Pt has had this pain x 1 month and has already had negative d-dimer and negative venous duplex US.  Her pain was somewhat better with toradol.  She has upcoming PCP appointment.  I have encouraged her to continue seeking answers and further workup for this, referred to orthopedist.  D/C home with mobic, robaxin, PCP, orthopedic follow up.  Discussed result,  findings, treatment, and follow up  with patient.  Pt given return precautions.  Pt verbalizes understanding and agrees with plan.        Trixie Dredge, PA-C 12/18/15 1449  Arby Barrette, MD 12/19/15 [phone]

## 2015-12-18 NOTE — ED Notes (Addendum)
Pt here for complete left side pain. sts the pain goes from left hand and shoots into left leg and then back up into her chest. sts intermittent x 2 days. sts unable to put pressure on left leg. sts she works on her feet. sts that her toes are numb and sometimes her foot goes numb

## 2015-12-18 NOTE — Discharge Instructions (Signed)
Read the information below.  Use the prescribed medication as directed.  Please discuss all new medications with your pharmacist.  You may return to the Emergency Department at any time for worsening condition or any new symptoms that concern you.   If you develop uncontrolled pain, weakness or numbness of the extremity, severe discoloration of the skin, or you are unable to walk or move your leg, return to the ER for a recheck.      Musculoskeletal Pain Musculoskeletal pain is muscle and boney aches and pains. These pains can occur in any part of the body. Your caregiver may treat you without knowing the cause of the pain. They may treat you if blood or urine tests, X-rays, and other tests were normal.  CAUSES There is often not a definite cause or reason for these pains. These pains may be caused by a type of germ (virus). The discomfort may also come from overuse. Overuse includes working out too hard when your body is not fit. Boney aches also come from weather changes. Bone is sensitive to atmospheric pressure changes. HOME CARE INSTRUCTIONS   Ask when your test results will be ready. Make sure you get your test results.  Only take over-the-counter or prescription medicines for pain, discomfort, or fever as directed by your caregiver. If you were given medications for your condition, do not drive, operate machinery or power tools, or sign legal documents for 24 hours. Do not drink alcohol. Do not take sleeping pills or other medications that may interfere with treatment.  Continue all activities unless the activities cause more pain. When the pain lessens, slowly resume normal activities. Gradually increase the intensity and duration of the activities or exercise.  During periods of severe pain, bed rest may be helpful. Lay or sit in any position that is comfortable.  Putting ice on the injured area.  Put ice in a bag.  Place a towel between your skin and the bag.  Leave the ice on for 15  to 20 minutes, 3 to 4 times a day.  Follow up with your caregiver for continued problems and no reason can be found for the pain. If the pain becomes worse or does not go away, it may be necessary to repeat tests or do additional testing. Your caregiver may need to look further for a possible cause. SEEK IMMEDIATE MEDICAL CARE IF:  You have pain that is getting worse and is not relieved by medications.  You develop chest pain that is associated with shortness or breath, sweating, feeling sick to your stomach (nauseous), or throw up (vomit).  Your pain becomes localized to the abdomen.  You develop any new symptoms that seem different or that concern you. MAKE SURE YOU:   Understand these instructions.  Will watch your condition.  Will get help right away if you are not doing well or get worse.   This information is not intended to replace advice given to you by your health care provider. Make sure you discuss any questions you have with your health care provider.   Document Released: 09/08/2005 Document Revised: 12/01/2011 Document Reviewed: 05/13/2013 Elsevier Interactive Patient Education 2016 Elsevier Inc.  Pain Without a Known Cause WHAT IS PAIN WITHOUT A KNOWN CAUSE? Pain can occur in any part of the body and can range from mild to severe. Sometimes no cause can be found for why you are having pain. Some types of pain that can occur without a known cause include:   Headache.  Back pain.  Abdominal pain.  Neck pain. HOW IS PAIN WITHOUT A KNOWN CAUSE DIAGNOSED?  Your health care provider will try to find the cause of your pain. This may include:  Physical exam.  Medical history.  Blood tests.  Urine tests.  X-rays. If no cause is found, your health care provider may diagnose you with pain without a known cause.  IS THERE TREATMENT FOR PAIN WITHOUT A CAUSE?  Treatment depends on the kind of pain you have. Your health care provider may prescribe medicines to help  relieve your pain.  WHAT CAN I DO AT HOME FOR MY PAIN?   Take medicines only as directed by your health care provider.  Stop any activities that cause pain. During periods of severe pain, bed rest may help.  Try to reduce your stress with activities such as yoga or meditation. Talk to your health care provider for other stress-reducing activity recommendations.  Exercise regularly, if approved by your health care provider.  Eat a healthy diet that includes fruits and vegetables. This may improve pain. Talk to your health care provider if you have any questions about your diet. WHAT IF MY PAIN DOES NOT GET BETTER?  If you have a painful condition and no reason can be found for the pain or the pain gets worse, it is important to follow up with your health care provider. It may be necessary to repeat tests and look further for a possible cause.    This information is not intended to replace advice given to you by your health care provider. Make sure you discuss any questions you have with your health care provider.   Document Released: 06/03/2001 Document Revised: 09/29/2014 Document Reviewed: 01/24/2014 Elsevier Interactive Patient Education [employer]

## 2016-01-09 ENCOUNTER — Encounter (HOSPITAL_COMMUNITY): Payer: Self-pay | Admitting: *Deleted

## 2016-01-09 ENCOUNTER — Emergency Department (HOSPITAL_COMMUNITY)
Admission: EM | Admit: 2016-01-09 | Discharge: 2016-01-09 | Disposition: A | Discharge status: Home / Self Care | Payer: Self-pay | Attending: Emergency Medicine | Admitting: Emergency Medicine

## 2016-01-09 ENCOUNTER — Emergency Department (HOSPITAL_COMMUNITY): Payer: Self-pay

## 2016-01-09 DIAGNOSIS — R5383 Other fatigue: Secondary | ICD-10-CM | POA: Insufficient documentation

## 2016-01-09 DIAGNOSIS — I10 Essential (primary) hypertension: Secondary | ICD-10-CM | POA: Insufficient documentation

## 2016-01-09 DIAGNOSIS — M79602 Pain in left arm: Secondary | ICD-10-CM | POA: Insufficient documentation

## 2016-01-09 DIAGNOSIS — F17210 Nicotine dependence, cigarettes, uncomplicated: Secondary | ICD-10-CM | POA: Insufficient documentation

## 2016-01-09 DIAGNOSIS — M549 Dorsalgia, unspecified: Secondary | ICD-10-CM | POA: Insufficient documentation

## 2016-01-09 DIAGNOSIS — R1110 Vomiting, unspecified: Secondary | ICD-10-CM | POA: Insufficient documentation

## 2016-01-09 DIAGNOSIS — R6883 Chills (without fever): Secondary | ICD-10-CM | POA: Insufficient documentation

## 2016-01-09 DIAGNOSIS — R079 Chest pain, unspecified: Secondary | ICD-10-CM | POA: Insufficient documentation

## 2016-01-09 DIAGNOSIS — R109 Unspecified abdominal pain: Secondary | ICD-10-CM | POA: Insufficient documentation

## 2016-01-09 DIAGNOSIS — R42 Dizziness and giddiness: Secondary | ICD-10-CM | POA: Insufficient documentation

## 2016-01-09 LAB — CBC
HCT: 38.7 % (ref 36.0–46.0)
HEMOGLOBIN: 12.6 g/dL (ref 12.0–15.0)
MCH: 25 pg — ABNORMAL LOW (ref 26.0–34.0)
MCHC: 32.6 g/dL (ref 30.0–36.0)
MCV: 76.9 fL — ABNORMAL LOW (ref 78.0–100.0)
PLATELETS: 283 10*3/uL (ref 150–400)
RBC: 5.03 MIL/uL (ref 3.87–5.11)
RDW: 15.9 % — ABNORMAL HIGH (ref 11.5–15.5)
WBC: 8.8 10*3/uL (ref 4.0–10.5)

## 2016-01-09 LAB — BASIC METABOLIC PANEL
ANION GAP: 9 (ref 5–15)
BUN: 11 mg/dL (ref 6–20)
CALCIUM: 9.3 mg/dL (ref 8.9–10.3)
CO2: 24 mmol/L (ref 22–32)
CREATININE: 0.98 mg/dL (ref 0.44–1.00)
Chloride: 105 mmol/L (ref 101–111)
GLUCOSE: 115 mg/dL — AB (ref 65–99)
Potassium: 4 mmol/L (ref 3.5–5.1)
Sodium: 138 mmol/L (ref 135–145)

## 2016-01-09 LAB — I-STAT TROPONIN, ED: TROPONIN I, POC: 0.02 ng/mL (ref 0.00–0.08)

## 2016-01-09 MED ORDER — ONDANSETRON 4 MG PO TBDP
4.0000 mg | ORAL_TABLET | Freq: Once | ORAL | Status: AC
  Administered 2016-01-09: 4 mg via ORAL

## 2016-01-09 MED ORDER — ONDANSETRON 4 MG PO TBDP
ORAL_TABLET | ORAL | Status: AC
  Filled 2016-01-09: qty 1

## 2016-01-09 NOTE — ED Notes (Signed)
Pt ambulated out of the waiting area and entered a car, which drove off. Pt removed from the floor.

## 2016-01-09 NOTE — ED Notes (Signed)
Pt reports not felling well for about a hour. Pt activity vomiting in triage. Pt reports dizziness, fatigue, chills, chest pain, back pain, left arm pain.

## 2016-01-18 ENCOUNTER — Emergency Department (HOSPITAL_COMMUNITY)
Admission: EM | Admit: 2016-01-18 | Discharge: 2016-01-19 | Disposition: A | Discharge status: Home / Self Care | Payer: 59 | Attending: Emergency Medicine | Admitting: Emergency Medicine

## 2016-01-18 ENCOUNTER — Encounter (HOSPITAL_COMMUNITY): Payer: Self-pay | Admitting: Emergency Medicine

## 2016-01-18 DIAGNOSIS — R109 Unspecified abdominal pain: Secondary | ICD-10-CM | POA: Insufficient documentation

## 2016-01-18 DIAGNOSIS — I10 Essential (primary) hypertension: Secondary | ICD-10-CM | POA: Insufficient documentation

## 2016-01-18 DIAGNOSIS — F17210 Nicotine dependence, cigarettes, uncomplicated: Secondary | ICD-10-CM | POA: Diagnosis not present

## 2016-01-18 DIAGNOSIS — R509 Fever, unspecified: Secondary | ICD-10-CM | POA: Insufficient documentation

## 2016-01-18 LAB — URINE MICROSCOPIC-ADD ON

## 2016-01-18 LAB — URINALYSIS, ROUTINE W REFLEX MICROSCOPIC
GLUCOSE, UA: NEGATIVE mg/dL
Ketones, ur: NEGATIVE mg/dL
LEUKOCYTES UA: NEGATIVE
NITRITE: NEGATIVE
PH: 5.5 (ref 5.0–8.0)
PROTEIN: 30 mg/dL — AB
SPECIFIC GRAVITY, URINE: 1.037 — AB (ref 1.005–1.030)

## 2016-01-18 NOTE — ED Notes (Signed)
Pt states three days ago she began having flank pain radiating to the stomach.  Reports low grade fever yesterday.  Denies nausea & vomiting or any blood in urine.  Reports urinating less than usual.  Pt also states she has had kidney infections in the past.

## 2016-01-19 ENCOUNTER — Emergency Department (HOSPITAL_COMMUNITY)
Admission: EM | Admit: 2016-01-19 | Discharge: 2016-01-19 | Disposition: A | Discharge status: Home / Self Care | Payer: Self-pay | Attending: Emergency Medicine | Admitting: Emergency Medicine

## 2016-01-19 ENCOUNTER — Encounter (HOSPITAL_COMMUNITY): Payer: Self-pay

## 2016-01-19 ENCOUNTER — Emergency Department (HOSPITAL_COMMUNITY): Payer: Self-pay

## 2016-01-19 DIAGNOSIS — Z3202 Encounter for pregnancy test, result negative: Secondary | ICD-10-CM | POA: Insufficient documentation

## 2016-01-19 DIAGNOSIS — N390 Urinary tract infection, site not specified: Secondary | ICD-10-CM

## 2016-01-19 DIAGNOSIS — I10 Essential (primary) hypertension: Secondary | ICD-10-CM | POA: Insufficient documentation

## 2016-01-19 DIAGNOSIS — R10A0 Flank pain, unspecified side: Secondary | ICD-10-CM

## 2016-01-19 DIAGNOSIS — R109 Unspecified abdominal pain: Secondary | ICD-10-CM

## 2016-01-19 DIAGNOSIS — F17210 Nicotine dependence, cigarettes, uncomplicated: Secondary | ICD-10-CM | POA: Insufficient documentation

## 2016-01-19 DIAGNOSIS — Z8742 Personal history of other diseases of the female genital tract: Secondary | ICD-10-CM | POA: Insufficient documentation

## 2016-01-19 LAB — URINALYSIS, ROUTINE W REFLEX MICROSCOPIC
Bilirubin Urine: NEGATIVE
GLUCOSE, UA: NEGATIVE mg/dL
Ketones, ur: NEGATIVE mg/dL
Nitrite: POSITIVE — AB
Protein, ur: NEGATIVE mg/dL
SPECIFIC GRAVITY, URINE: 1.017 (ref 1.005–1.030)
pH: 6 (ref 5.0–8.0)

## 2016-01-19 LAB — URINE MICROSCOPIC-ADD ON

## 2016-01-19 LAB — POC URINE PREG, ED: PREG TEST UR: NEGATIVE

## 2016-01-19 MED ORDER — HYDROMORPHONE HCL 1 MG/ML IJ SOLN
1.0000 mg | Freq: Once | INTRAMUSCULAR | Status: AC
  Administered 2016-01-19: 1 mg via INTRAMUSCULAR
  Filled 2016-01-19: qty 1

## 2016-01-19 MED ORDER — HYDROCODONE-ACETAMINOPHEN 5-325 MG PO TABS: 1.0000 | ORAL_TABLET | ORAL | Status: DC | PRN

## 2016-01-19 MED ORDER — CEPHALEXIN 500 MG PO CAPS: 500.0000 mg | ORAL_CAPSULE | Freq: Four times a day (QID) | ORAL | Status: DC

## 2016-01-19 NOTE — ED Notes (Signed)
Patient here with dysuria and left sided flank pain x 3 days. [facility] but left prior to treatment.

## 2016-01-19 NOTE — ED Notes (Signed)
Pt called for room no reply from lobby 

## 2016-01-19 NOTE — ED Notes (Signed)
Pt has a hx of kidney infections and UTIs-- states this feels like a kidney infection. Denies fever for past 2 days-- [facility]. Felt too bad to sit in waiting room-- waited more than 4 hours.

## 2016-01-19 NOTE — ED Notes (Signed)
Pt called for room, no answer.

## 2016-01-19 NOTE — ED Provider Notes (Signed)
CSN: [id number]     Arrival date & time 01/19/16  0847 History   First MD Initiated Contact with Patient 01/19/16 [phone]     Chief Complaint  Patient presents with  . Flank Pain     (Consider location/radiation/quality/duration/timing/severity/associated sxs/prior Treatment) HPI Comments: Patient here complaining of left-sided flank pain 2 days which she states is similar to when she's had kidney infections in the past. Denies any fever or chills. Nothing makes her pain better. Has had some dark urine but denies any hematuria. Pain is been colicky and she denies any history of kidney stone. No vaginal bleeding or discharge recently. Was seen at another hospital yesterday but did not wait to complete the evaluation. The urinalysis with visit was noted to show hematuria but no signs of infection. She has been using over-the-counter medications and does not have relief from them.  Patient is a 30 y.o. female presenting with flank pain. The history is provided by the patient.  Flank Pain    Past Medical History  Diagnosis Date  . UTI (lower urinary tract infection)   . BV (bacterial vaginosis)   . UTI (lower urinary tract infection)   . Hypertension   . Kidney infection    Past Surgical History  Procedure Laterality Date  . Cesarean section      x3   Family History  Problem Relation Age of Onset  . Hypertension Mother   . Heart disease Father   . Asthma Brother    Social History  Substance Use Topics  . Smoking status: Current Every Day Smoker -- 0.25 packs/day for 7 years    Types: Cigarettes  . Smokeless tobacco: Current User  . Alcohol Use: No   OB History    Gravida Para Term Preterm AB TAB SAB Ectopic Multiple Living   4 3 1 2 1 1   0 0 3     Review of Systems  Genitourinary: Positive for flank pain.  All other systems reviewed and are negative.     Allergies  Review of patient's allergies indicates no known allergies.  Home Medications   Prior to Admission  medications   Medication Sig Start Date End Date Taking? Authorizing Provider  acetaminophen-codeine 120-12 MG/5ML solution Take 10 mLs by mouth every 4 (four) hours as needed for moderate pain. Patient not taking: Reported on 12/18/2015 10/24/15   Charlestine Nighthristopher Lawyer, PA-C  etonogestrel-ethinyl estradiol (NUVARING) 0.12-0.015 MG/24HR vaginal ring Insert vaginally and leave in place for 3 consecutive weeks, then remove for 1 week. Patient not taking: Reported on 12/18/2015 10/03/15   Rhona RaiderJacob J Stinson, DO  Guaifenesin 1200 MG TB12 Take 1 tablet (1,200 mg total) by mouth 2 (two) times daily. Patient not taking: Reported on 12/18/2015 10/24/15   Charlestine Nighthristopher Lawyer, PA-C  meloxicam (MOBIC) 7.5 MG tablet Take 1 tablet (7.5 mg total) by mouth daily as needed for pain. 12/18/15   Trixie DredgeEmily West, PA-C  methocarbamol (ROBAXIN) 500 MG tablet Take 1-2 tablets (500-1,000 mg total) by mouth every 6 (six) hours as needed for muscle spasms (and pain). 12/18/15   Trixie DredgeEmily West, PA-C  ondansetron (ZOFRAN) 4 MG tablet Take 1 tablet (4 mg total) by mouth every 6 (six) hours. Patient not taking: Reported on 12/18/2015 10/15/15   Rolm GalaStevi Barrett, PA-C   BP 116/88 mmHg  Pulse 102  Temp(Src) 98.2 F (36.8 C) (Oral)  Resp 18  SpO2 100%  LMP 01/06/2016 (Exact Date) Physical Exam  Constitutional: She is oriented to person, place, and time. She appears  well-developed and well-nourished.  Non-toxic appearance. No distress.  HENT:  Head: Normocephalic and atraumatic.  Eyes: Conjunctivae, EOM and lids are normal. Pupils are equal, round, and reactive to light.  Neck: Normal range of motion. Neck supple. No tracheal deviation present. No thyroid mass present.  Cardiovascular: Normal rate, regular rhythm and normal heart sounds.  Exam reveals no gallop.   No murmur heard. Pulmonary/Chest: Effort normal and breath sounds normal. No stridor. No respiratory distress. She has no decreased breath sounds. She has no wheezes. She has no rhonchi. She  has no rales.  Abdominal: Soft. Normal appearance and bowel sounds are normal. She exhibits no distension. There is no tenderness. There is no rigidity, no rebound, no guarding and no CVA tenderness.  Musculoskeletal: Normal range of motion. She exhibits no edema or tenderness.       Arms: Neurological: She is alert and oriented to person, place, and time. She has normal strength. No cranial nerve deficit or sensory deficit. GCS eye subscore is 4. GCS verbal subscore is 5. GCS motor subscore is 6.  Skin: Skin is warm and dry. No abrasion and no rash noted.  Psychiatric: She has a normal mood and affect. Her speech is normal and behavior is normal.  Nursing note and vitals reviewed.   ED Course  Procedures (including critical care time) Labs Review Labs Reviewed  URINALYSIS, ROUTINE W REFLEX MICROSCOPIC (NOT AT [facility])  POC URINE PREG, ED    Imaging Review No results found. I have personally reviewed and evaluated these images and lab results as part of my medical decision-making.   EKG Interpretation None      MDM   Final diagnoses:  None    Patient given pain medicine feels better here. Urinalysis reviewed and shows possible infection. Will treat as well as given medications for her pain.    Lorre Nick, MD 01/19/16 1136

## 2016-01-19 NOTE — Discharge Instructions (Signed)

## 2016-01-19 NOTE — ED Notes (Signed)
Pt states has a "boil" on labia area-- states is red/ swollen.

## 2016-01-19 NOTE — ED Notes (Signed)
Pt called for room with no reply x 3

## 2016-03-15 ENCOUNTER — Emergency Department (HOSPITAL_COMMUNITY)
Admission: EM | Admit: 2016-03-15 | Discharge: 2016-03-15 | Disposition: A | Discharge status: Home / Self Care | Payer: 59 | Attending: Emergency Medicine | Admitting: Emergency Medicine

## 2016-03-15 DIAGNOSIS — M62838 Other muscle spasm: Secondary | ICD-10-CM | POA: Insufficient documentation

## 2016-03-15 DIAGNOSIS — M542 Cervicalgia: Secondary | ICD-10-CM

## 2016-03-15 DIAGNOSIS — F17210 Nicotine dependence, cigarettes, uncomplicated: Secondary | ICD-10-CM | POA: Insufficient documentation

## 2016-03-15 DIAGNOSIS — Z79899 Other long term (current) drug therapy: Secondary | ICD-10-CM | POA: Insufficient documentation

## 2016-03-15 DIAGNOSIS — I10 Essential (primary) hypertension: Secondary | ICD-10-CM | POA: Insufficient documentation

## 2016-03-15 DIAGNOSIS — M25512 Pain in left shoulder: Secondary | ICD-10-CM

## 2016-03-15 MED ORDER — MELOXICAM 7.5 MG PO TABS: 7.5000 mg | ORAL_TABLET | Freq: Two times a day (BID) | ORAL | Status: DC

## 2016-03-15 MED ORDER — CYCLOBENZAPRINE HCL 5 MG PO TABS: 5.0000 mg | ORAL_TABLET | Freq: Three times a day (TID) | ORAL | Status: AC

## 2016-03-15 MED ORDER — CYCLOBENZAPRINE HCL 10 MG PO TABS
5.0000 mg | ORAL_TABLET | Freq: Once | ORAL | Status: AC
  Administered 2016-03-15: 5 mg via ORAL
  Filled 2016-03-15: qty 1

## 2016-03-15 MED ORDER — KETOROLAC TROMETHAMINE 30 MG/ML IJ SOLN
30.0000 mg | Freq: Once | INTRAMUSCULAR | Status: AC
  Administered 2016-03-15: 30 mg via INTRAMUSCULAR
  Filled 2016-03-15 (×2): qty 1

## 2016-03-15 NOTE — Discharge Instructions (Signed)
Cryotherapy  Cryotherapy is when you put ice on your injury. Ice helps lessen pain and puffiness (swelling) after an injury. Ice works the best when you start using it in the first 24 to 48 hours after an injury.  HOME CARE  · Put a dry or damp towel between the ice pack and your skin.  · You may press gently on the ice pack.  · Leave the ice on for no more than 10 to 20 minutes at a time.  · Check your skin after 5 minutes to make sure your skin is okay.  · Rest at least 20 minutes between ice pack uses.  · Stop using ice when your skin loses feeling (numbness).  · Do not use ice on someone who cannot tell you when it hurts. This includes small children and people with memory problems (dementia).  GET HELP RIGHT AWAY IF:  · You have white spots on your skin.  · Your skin turns blue or pale.  · Your skin feels waxy or hard.  · Your puffiness gets worse.  MAKE SURE YOU:   · Understand these instructions.  · Will watch your condition.  · Will get help right away if you are not doing well or get worse.     This information is not intended to replace advice given to you by your health care provider. Make sure you discuss any questions you have with your health care provider.     Document Released: 02/25/2008 Document Revised: 12/01/2011 Document Reviewed: 05/01/2011  Elsevier Interactive Patient Education ©2016 [employer]

## 2016-03-15 NOTE — ED Notes (Signed)
Pt complains of left shoulder and neck pain since waking this morning. Pt states she was intoxicated last night and is not sure if she fell. Pt states she tried ibuprofen and tylenol, which did not provide relief.

## 2016-03-15 NOTE — ED Provider Notes (Signed)
CSN: [id number]     Arrival date & time 03/15/16  1435 History  By signing my name below, I, Ronney LionSuzanne Le, attest that this documentation has been prepared under the direction and in the presence of Earley FavorGail Jahaad Penado, NP. Electronically Signed: Ronney LionSuzanne Le, ED Scribe. 03/15/2016. 3:28 PM.    Chief Complaint  Patient presents with  . Shoulder Pain  . Neck Pain   The history is provided by the patient. No language interpreter was used.    HPI Comments: Lisa Vazquez is a 30 y.o. female with a history of UTI, BV, hypertension, and kidney infection, who presents to the Emergency Department complaining of constant, 9/10, left shoulder and neck pain since waking up this morning at 10 AM, about 5 hours ago. Patient states she woke up while lying on her right side. Patient states she was intoxicated last night while out with friends and is unsure whether she fell. She states she had taken Tylenol and ibuprofen today with minimal relief to her pain. Patient states she had also tried applying heat, with minimal relief. Patient reports NKDA. She states her LMP occurred 02/19/16 and was normal.   Past Medical History  Diagnosis Date  . UTI (lower urinary tract infection)   . BV (bacterial vaginosis)   . UTI (lower urinary tract infection)   . Hypertension   . Kidney infection    Past Surgical History  Procedure Laterality Date  . Cesarean section      x3   Family History  Problem Relation Age of Onset  . Hypertension Mother   . Heart disease Father   . Asthma Brother    Social History  Substance Use Topics  . Smoking status: Current Every Day Smoker -- 0.25 packs/day for 7 years    Types: Cigarettes  . Smokeless tobacco: Current User  . Alcohol Use: No   OB History    Gravida Para Term Preterm AB TAB SAB Ectopic Multiple Living   4 3 1 2 1 1   0 0 3     Review of Systems  Musculoskeletal: Positive for arthralgias and neck pain.  Neurological: Negative for weakness.  All other systems  reviewed and are negative.   Allergies  Review of patient's allergies indicates no known allergies.  Home Medications   Prior to Admission medications   Medication Sig Start Date End Date Taking? Authorizing Provider  acetaminophen (TYLENOL) 325 MG tablet Take 650 mg by mouth every 6 (six) hours as needed for headache (pain).    Historical Provider, MD  cephALEXin (KEFLEX) 500 MG capsule Take 1 capsule (500 mg total) by mouth 4 (four) times daily. 01/19/16   Lorre NickAnthony Allen, MD  cyclobenzaprine (FLEXERIL) 5 MG tablet Take 1 tablet (5 mg total) by mouth 3 (three) times daily. 03/15/16 03/18/16  Earley FavorGail Maddox Hlavaty, NP  etonogestrel-ethinyl estradiol (NUVARING) 0.12-0.015 MG/24HR vaginal ring Insert vaginally and leave in place for 3 consecutive weeks, then remove for 1 week. Patient not taking: Reported on 12/18/2015 10/03/15   Levie HeritageJacob J Stinson, DO  HYDROcodone-acetaminophen (NORCO/VICODIN) 5-325 MG tablet Take 1-2 tablets by mouth every 4 (four) hours as needed. 01/19/16   Lorre NickAnthony Allen, MD  ibuprofen (ADVIL,MOTRIN) 200 MG tablet Take 600 mg by mouth every 6 (six) hours as needed (pain).    Historical Provider, MD  meloxicam (MOBIC) 7.5 MG tablet Take 1 tablet (7.5 mg total) by mouth 2 (two) times daily. 03/15/16   Earley FavorGail Larenz Frasier, NP  methocarbamol (ROBAXIN) 500 MG tablet Take 1-2 tablets (500-1,000  mg total) by mouth every 6 (six) hours as needed for muscle spasms (and pain). Patient not taking: Reported on 01/19/2016 12/18/15   Trixie DredgeEmily West, PA-C   BP 136/99 mmHg  Pulse 78  Temp(Src) 98.3 F (36.8 C) (Oral)  Resp 18  SpO2 100%  LMP 02/19/2016 Physical Exam  Constitutional: She is oriented to person, place, and time. She appears well-developed and well-nourished. No distress.  HENT:  Head: Normocephalic and atraumatic.  Eyes: Conjunctivae and EOM are normal.  Neck: Neck supple. No tracheal deviation present.  Cardiovascular: Normal rate.   Pulmonary/Chest: Effort normal. No respiratory distress.   Musculoskeletal: Normal range of motion.  Neurological: She is alert and oriented to person, place, and time.  Skin: Skin is warm and dry.  Psychiatric: She has a normal mood and affect. Her behavior is normal.  Nursing note and vitals reviewed.   ED Course  Procedures (including critical care time)  DIAGNOSTIC STUDIES: Oxygen Saturation is 100% on RA, normal by my interpretation.    COORDINATION OF CARE: 3:27 PM - Discussed treatment plan with pt at bedside which includes Rx muscle relaxants. Pt verbalized understanding and agreed to plan.  Patient will be given muscle relaxer and anti-inflammatory to take on a regular basis as well as cryotherapy.  Follow-up her primary care physician as needed  MDM   Final diagnoses:  Shoulder pain, acute, left  Neck pain on right side  Muscle spasm of left shoulder   I personally performed the services described in this documentation, which was scribed in my presence. The recorded information has been reviewed and is accurate.    Earley FavorGail Fionnuala Hemmerich, NP 03/15/16 1534  Earley FavorGail Mekala Winger, NP 03/15/16 1535  Lyndal Pulleyaniel Knott, MD 03/17/16 [phone]

## 2016-03-26 ENCOUNTER — Encounter (HOSPITAL_COMMUNITY): Payer: Self-pay

## 2016-03-26 ENCOUNTER — Emergency Department (HOSPITAL_COMMUNITY)
Admission: EM | Admit: 2016-03-26 | Discharge: 2016-03-26 | Disposition: A | Discharge status: Home / Self Care | Payer: 59 | Attending: Emergency Medicine | Admitting: Emergency Medicine

## 2016-03-26 DIAGNOSIS — F17210 Nicotine dependence, cigarettes, uncomplicated: Secondary | ICD-10-CM | POA: Insufficient documentation

## 2016-03-26 DIAGNOSIS — Z791 Long term (current) use of non-steroidal anti-inflammatories (NSAID): Secondary | ICD-10-CM | POA: Insufficient documentation

## 2016-03-26 DIAGNOSIS — I10 Essential (primary) hypertension: Secondary | ICD-10-CM | POA: Insufficient documentation

## 2016-03-26 DIAGNOSIS — N12 Tubulo-interstitial nephritis, not specified as acute or chronic: Secondary | ICD-10-CM

## 2016-03-26 LAB — CBC WITH DIFFERENTIAL/PLATELET
BASOS PCT: 1 %
Basophils Absolute: 0 10*3/uL (ref 0.0–0.1)
Eosinophils Absolute: 0.3 10*3/uL (ref 0.0–0.7)
Eosinophils Relative: 3 %
HEMATOCRIT: 39.7 % (ref 36.0–46.0)
HEMOGLOBIN: 13.1 g/dL (ref 12.0–15.0)
LYMPHS PCT: 47 %
Lymphs Abs: 3.9 10*3/uL (ref 0.7–4.0)
MCH: 24.6 pg — ABNORMAL LOW (ref 26.0–34.0)
MCHC: 33 g/dL (ref 30.0–36.0)
MCV: 74.6 fL — ABNORMAL LOW (ref 78.0–100.0)
MONOS PCT: 10 %
Monocytes Absolute: 0.8 10*3/uL (ref 0.1–1.0)
NEUTROS ABS: 3.2 10*3/uL (ref 1.7–7.7)
NEUTROS PCT: 39 %
Platelets: 327 10*3/uL (ref 150–400)
RBC: 5.32 MIL/uL — ABNORMAL HIGH (ref 3.87–5.11)
RDW: 17.8 % — ABNORMAL HIGH (ref 11.5–15.5)
WBC: 8.3 10*3/uL (ref 4.0–10.5)

## 2016-03-26 LAB — COMPREHENSIVE METABOLIC PANEL
ALBUMIN: 3.9 g/dL (ref 3.5–5.0)
ALK PHOS: 59 U/L (ref 38–126)
ALT: 12 U/L — ABNORMAL LOW (ref 14–54)
ANION GAP: 6 (ref 5–15)
AST: 19 U/L (ref 15–41)
BILIRUBIN TOTAL: 0.6 mg/dL (ref 0.3–1.2)
BUN: 13 mg/dL (ref 6–20)
CALCIUM: 8.8 mg/dL — AB (ref 8.9–10.3)
CO2: 27 mmol/L (ref 22–32)
Chloride: 102 mmol/L (ref 101–111)
Creatinine, Ser: 1.02 mg/dL — ABNORMAL HIGH (ref 0.44–1.00)
GFR calc Af Amer: >60 mL/min (ref >60)
GFR calc non Af Amer: >60 mL/min (ref >60)
GLUCOSE: 89 mg/dL (ref 65–99)
Potassium: 3.7 mmol/L (ref 3.5–5.1)
Sodium: 135 mmol/L (ref 135–145)
TOTAL PROTEIN: 7.1 g/dL (ref 6.5–8.1)

## 2016-03-26 LAB — URINE MICROSCOPIC-ADD ON

## 2016-03-26 LAB — POC URINE PREG, ED: Preg Test, Ur: NEGATIVE

## 2016-03-26 LAB — URINALYSIS, ROUTINE W REFLEX MICROSCOPIC
Bilirubin Urine: NEGATIVE
GLUCOSE, UA: NEGATIVE mg/dL
HGB URINE DIPSTICK: NEGATIVE
Ketones, ur: NEGATIVE mg/dL
LEUKOCYTES UA: NEGATIVE
Nitrite: POSITIVE — AB
Protein, ur: NEGATIVE mg/dL
Specific Gravity, Urine: 1.025 (ref 1.005–1.030)
pH: 5.5 (ref 5.0–8.0)

## 2016-03-26 LAB — LIPASE, BLOOD: Lipase: 20 U/L (ref 11–51)

## 2016-03-26 MED ORDER — NAPROXEN 500 MG PO TABS: 500.0000 mg | ORAL_TABLET | Freq: Two times a day (BID) | ORAL | Status: DC

## 2016-03-26 MED ORDER — ONDANSETRON 4 MG PO TBDP: 4.0000 mg | ORAL_TABLET | Freq: Three times a day (TID) | ORAL | Status: DC | PRN

## 2016-03-26 MED ORDER — SODIUM CHLORIDE 0.9 % IV BOLUS (SEPSIS)
1000.0000 mL | Freq: Once | INTRAVENOUS | Status: AC
  Administered 2016-03-26: 1000 mL via INTRAVENOUS

## 2016-03-26 MED ORDER — ONDANSETRON HCL 4 MG/2ML IJ SOLN
4.0000 mg | Freq: Once | INTRAMUSCULAR | Status: AC
  Administered 2016-03-26: 4 mg via INTRAVENOUS
  Filled 2016-03-26: qty 2

## 2016-03-26 MED ORDER — MORPHINE SULFATE (PF) 4 MG/ML IV SOLN
4.0000 mg | Freq: Once | INTRAVENOUS | Status: AC
  Administered 2016-03-26: 4 mg via INTRAVENOUS
  Filled 2016-03-26: qty 1

## 2016-03-26 MED ORDER — HYDROCODONE-ACETAMINOPHEN 5-325 MG PO TABS: 1.0000 | ORAL_TABLET | ORAL | Status: DC | PRN

## 2016-03-26 MED ORDER — CEPHALEXIN 500 MG PO CAPS: 500.0000 mg | ORAL_CAPSULE | Freq: Three times a day (TID) | ORAL | Status: AC

## 2016-03-26 MED ORDER — KETOROLAC TROMETHAMINE 30 MG/ML IJ SOLN
30.0000 mg | Freq: Once | INTRAMUSCULAR | Status: AC
  Administered 2016-03-26: 30 mg via INTRAVENOUS
  Filled 2016-03-26: qty 1

## 2016-03-26 NOTE — Discharge Instructions (Signed)

## 2016-03-26 NOTE — ED Provider Notes (Signed)
CSN: [id number]     Arrival date & time 03/26/16  1125 History   First MD Initiated Contact with Patient 03/26/16 1133     Chief Complaint  Patient presents with  . Flank Pain     (Consider location/radiation/quality/duration/timing/severity/associated sxs/prior Treatment) HPI  Pain in lower back shooting to stomach on left side.  Only urinating once per day, dark urine, strong odor. Hurts to move.  Has had 2 hr of sleep due to pain. 10/10 pain. Nausea but no vomiting, diarrhea, vaginal discharge, constipation, fevers.  Past Medical History  Diagnosis Date  . UTI (lower urinary tract infection)   . BV (bacterial vaginosis)   . UTI (lower urinary tract infection)   . Hypertension   . Kidney infection    Past Surgical History  Procedure Laterality Date  . Cesarean section      x3   Family History  Problem Relation Age of Onset  . Hypertension Mother   . Heart disease Father   . Asthma Brother    Social History  Substance Use Topics  . Smoking status: Current Every Day Smoker -- 0.25 packs/day for 7 years    Types: Cigarettes  . Smokeless tobacco: Current User  . Alcohol Use: No   OB History    Gravida Para Term Preterm AB TAB SAB Ectopic Multiple Living   4 3 1 2 1 1   0 0 3     Review of Systems  Constitutional: Negative for fever.  HENT: Negative for sore throat.   Eyes: Negative for visual disturbance.  Respiratory: Negative for cough and shortness of breath.   Cardiovascular: Negative for chest pain.  Gastrointestinal: Positive for nausea. Negative for vomiting, abdominal pain, diarrhea and constipation.  Genitourinary: Positive for urgency and flank pain. Negative for dysuria, frequency, vaginal bleeding, vaginal discharge and difficulty urinating.  Musculoskeletal: Negative for back pain and neck pain.  Skin: Negative for rash.  Neurological: Negative for syncope and headaches.      Allergies  Review of patient's allergies indicates no known  allergies.  Home Medications   Prior to Admission medications   Medication Sig Start Date End Date Taking? Authorizing Provider  ibuprofen (ADVIL,MOTRIN) 200 MG tablet Take 800 mg by mouth every 4 (four) hours as needed (for pain).    Yes Historical Provider, MD  oxyCODONE-acetaminophen (PERCOCET/ROXICET) 5-325 MG tablet Take 1 tablet by mouth every 6 (six) hours as needed for severe pain.   Yes Historical Provider, MD  cephALEXin (KEFLEX) 500 MG capsule Take 1 capsule (500 mg total) by mouth 3 (three) times daily. 03/26/16 04/09/16  Alvira MondayErin Tamecca Artiga, MD  etonogestrel-ethinyl estradiol (NUVARING) 0.12-0.015 MG/24HR vaginal ring Insert vaginally and leave in place for 3 consecutive weeks, then remove for 1 week. Patient not taking: Reported on 12/18/2015 10/03/15   Levie HeritageJacob J Stinson, DO  HYDROcodone-acetaminophen (NORCO/VICODIN) 5-325 MG tablet Take 1 tablet by mouth every 4 (four) hours as needed. 03/26/16   Alvira MondayErin Clio Gerhart, MD  methocarbamol (ROBAXIN) 500 MG tablet Take 1-2 tablets (500-1,000 mg total) by mouth every 6 (six) hours as needed for muscle spasms (and pain). Patient not taking: Reported on 01/19/2016 12/18/15   Trixie DredgeEmily West, PA-C  naproxen (NAPROSYN) 500 MG tablet Take 1 tablet (500 mg total) by mouth 2 (two) times daily with a meal. 03/26/16   Alvira MondayErin Tressy Kunzman, MD  ondansetron (ZOFRAN ODT) 4 MG disintegrating tablet Take 1 tablet (4 mg total) by mouth every 8 (eight) hours as needed for nausea or vomiting. 03/26/16  Alvira MondayErin Casimir Barcellos, MD   BP 114/80 mmHg  Pulse 73  Temp(Src) 97.9 F (36.6 C) (Oral)  Resp 18  SpO2 94%  LMP 03/21/2016 (Approximate) Physical Exam  Constitutional: She is oriented to person, place, and time. She appears well-developed and well-nourished. No distress.  HENT:  Head: Normocephalic and atraumatic.  Eyes: Conjunctivae and EOM are normal.  Neck: Normal range of motion.  Cardiovascular: Normal rate, regular rhythm, normal heart sounds and intact distal pulses.  Exam  reveals no gallop and no friction rub.   No murmur heard. Pulmonary/Chest: Effort normal and breath sounds normal. No respiratory distress. She has no wheezes. She has no rales.  Abdominal: Soft. She exhibits no distension. There is tenderness in the suprapubic area and left upper quadrant. There is CVA tenderness (L). There is no guarding, no tenderness at McBurney's point and negative Murphy's sign.  Musculoskeletal: She exhibits no edema or tenderness.  Neurological: She is alert and oriented to person, place, and time.  Skin: Skin is warm and dry. No rash noted. She is not diaphoretic. No erythema.  Nursing note and vitals reviewed.   ED Course  Procedures (including critical care time) Labs Review Labs Reviewed  URINALYSIS, ROUTINE W REFLEX MICROSCOPIC (NOT AT [facility]) - Abnormal; Notable for the following:    Nitrite POSITIVE (*)    All other components within normal limits  CBC WITH DIFFERENTIAL/PLATELET - Abnormal; Notable for the following:    RBC 5.32 (*)    MCV 74.6 (*)    MCH 24.6 (*)    RDW 17.8 (*)    All other components within normal limits  COMPREHENSIVE METABOLIC PANEL - Abnormal; Notable for the following:    Creatinine, Ser 1.02 (*)    Calcium 8.8 (*)    ALT 12 (*)    All other components within normal limits  URINE MICROSCOPIC-ADD ON - Abnormal; Notable for the following:    Squamous Epithelial / LPF 0-5 (*)    Bacteria, UA MANY (*)    All other components within normal limits  LIPASE, BLOOD  POC URINE PREG, ED    Imaging Review No results found. I have personally reviewed and evaluated these images and lab results as part of my medical decision-making.   EKG Interpretation None      MDM   Final diagnoses:  Pyelonephritis   29yo female with hx of htn, pyelonephritis presents with concern for urinary hesitancy, urgency, ad left flank pain.  History, physical exam and urinalysis consistent with likely pyelonephritis. Pain not colicky, doubt  nephrolithiasis. No vaginal discharge, denies concern for pelvic infxn. Given rx for keflex for 2 weeks, zofran, naproxen, small rx for norco. Discussed reasons to return in detail. Patient discharged in stable condition with understanding of reasons to return.    Alvira MondayErin Deklin Bieler, MD 03/27/16 1309

## 2016-03-26 NOTE — ED Notes (Signed)
Discharge instructions, follow up care, and prescriptions reviewed with patient. Patient verbalized understanding. 

## 2016-03-26 NOTE — ED Notes (Signed)
Pt presents with c/o left flank pain that radiates through to her stomach. Pt reports she believes she has a kidney infection, hx of same. Pt denies any blood in her urine or vomiting but reports she has been nauseated. Pt reports the pain has been going on since last week.

## 2016-03-30 ENCOUNTER — Emergency Department (HOSPITAL_COMMUNITY)
Admission: EM | Admit: 2016-03-30 | Discharge: 2016-03-30 | Disposition: A | Discharge status: Home / Self Care | Payer: 59 | Attending: Emergency Medicine | Admitting: Emergency Medicine

## 2016-03-30 ENCOUNTER — Encounter (HOSPITAL_COMMUNITY): Payer: Self-pay | Admitting: *Deleted

## 2016-03-30 DIAGNOSIS — R109 Unspecified abdominal pain: Secondary | ICD-10-CM

## 2016-03-30 DIAGNOSIS — R112 Nausea with vomiting, unspecified: Secondary | ICD-10-CM | POA: Insufficient documentation

## 2016-03-30 DIAGNOSIS — R1030 Lower abdominal pain, unspecified: Secondary | ICD-10-CM | POA: Insufficient documentation

## 2016-03-30 DIAGNOSIS — I10 Essential (primary) hypertension: Secondary | ICD-10-CM | POA: Insufficient documentation

## 2016-03-30 DIAGNOSIS — Z792 Long term (current) use of antibiotics: Secondary | ICD-10-CM | POA: Insufficient documentation

## 2016-03-30 DIAGNOSIS — R300 Dysuria: Secondary | ICD-10-CM | POA: Insufficient documentation

## 2016-03-30 DIAGNOSIS — F17210 Nicotine dependence, cigarettes, uncomplicated: Secondary | ICD-10-CM | POA: Insufficient documentation

## 2016-03-30 DIAGNOSIS — Z791 Long term (current) use of non-steroidal anti-inflammatories (NSAID): Secondary | ICD-10-CM | POA: Insufficient documentation

## 2016-03-30 LAB — CBC WITH DIFFERENTIAL/PLATELET
BASOS PCT: 0 %
Basophils Absolute: 0 10*3/uL (ref 0.0–0.1)
EOS ABS: 0.1 10*3/uL (ref 0.0–0.7)
Eosinophils Relative: 2 %
HCT: 36.9 % (ref 36.0–46.0)
HEMOGLOBIN: 12.2 g/dL (ref 12.0–15.0)
Lymphocytes Relative: 40 %
Lymphs Abs: 2.7 10*3/uL (ref 0.7–4.0)
MCH: 24.6 pg — ABNORMAL LOW (ref 26.0–34.0)
MCHC: 33.1 g/dL (ref 30.0–36.0)
MCV: 74.5 fL — ABNORMAL LOW (ref 78.0–100.0)
Monocytes Absolute: 0.6 10*3/uL (ref 0.1–1.0)
Monocytes Relative: 9 %
NEUTROS PCT: 49 %
Neutro Abs: 3.2 10*3/uL (ref 1.7–7.7)
PLATELETS: 305 10*3/uL (ref 150–400)
RBC: 4.95 MIL/uL (ref 3.87–5.11)
RDW: 17.9 % — ABNORMAL HIGH (ref 11.5–15.5)
WBC: 6.6 10*3/uL (ref 4.0–10.5)

## 2016-03-30 LAB — URINALYSIS, ROUTINE W REFLEX MICROSCOPIC
BILIRUBIN URINE: NEGATIVE
Glucose, UA: NEGATIVE mg/dL
Hgb urine dipstick: NEGATIVE
KETONES UR: NEGATIVE mg/dL
Leukocytes, UA: NEGATIVE
NITRITE: NEGATIVE
Protein, ur: NEGATIVE mg/dL
Specific Gravity, Urine: 1.026 (ref 1.005–1.030)
pH: 6 (ref 5.0–8.0)

## 2016-03-30 LAB — RAPID HIV SCREEN (HIV 1/2 AB+AG)
HIV 1/2 ANTIBODIES: NONREACTIVE
HIV-1 P24 ANTIGEN - HIV24: NONREACTIVE

## 2016-03-30 LAB — I-STAT CHEM 8, ED
BUN: 10 mg/dL (ref 6–20)
CALCIUM ION: 1.16 mmol/L (ref 1.13–1.30)
Chloride: 103 mmol/L (ref 101–111)
Creatinine, Ser: 0.9 mg/dL (ref 0.44–1.00)
Glucose, Bld: 95 mg/dL (ref 65–99)
HCT: 41 % (ref 36.0–46.0)
HEMOGLOBIN: 13.9 g/dL (ref 12.0–15.0)
Potassium: 3.7 mmol/L (ref 3.5–5.1)
SODIUM: 140 mmol/L (ref 135–145)
TCO2: 25 mmol/L (ref 0–100)

## 2016-03-30 MED ORDER — ONDANSETRON HCL 4 MG/2ML IJ SOLN
4.0000 mg | Freq: Once | INTRAMUSCULAR | Status: AC
  Administered 2016-03-30: 4 mg via INTRAVENOUS
  Filled 2016-03-30: qty 2

## 2016-03-30 MED ORDER — NAPROXEN 500 MG PO TABS: 500.0000 mg | ORAL_TABLET | Freq: Two times a day (BID) | ORAL | Status: DC

## 2016-03-30 MED ORDER — SODIUM CHLORIDE 0.9 % IV BOLUS (SEPSIS)
1000.0000 mL | Freq: Once | INTRAVENOUS | Status: AC
  Administered 2016-03-30: 1000 mL via INTRAVENOUS

## 2016-03-30 MED ORDER — MORPHINE SULFATE (PF) 4 MG/ML IV SOLN
4.0000 mg | Freq: Once | INTRAVENOUS | Status: AC
  Administered 2016-03-30: 4 mg via INTRAVENOUS
  Filled 2016-03-30: qty 1

## 2016-03-30 MED ORDER — SULFAMETHOXAZOLE-TRIMETHOPRIM 800-160 MG PO TABS: 1.0000 | ORAL_TABLET | Freq: Two times a day (BID) | ORAL | Status: AC

## 2016-03-30 NOTE — ED Notes (Signed)
Pt refused blood draw at this time stating she wants to wait to see EDP first since she just had blood drawn 3 days ago

## 2016-03-30 NOTE — Discharge Instructions (Signed)
Flank Pain °Flank pain is pain in your side. The flank is the area of your side between your upper belly (abdomen) and your back. Pain in this area can be caused by many different things. °HOME CARE °Home care and treatment will depend on the cause of your pain. °· Rest as told by your doctor. °· Drink enough fluids to keep your pee (urine) clear or pale yellow.   °· Only take medicine as told by your doctor. °· Tell your doctor about any changes in your pain. °· Follow up with your doctor. °GET HELP RIGHT AWAY IF:  °· Your pain does not get better with medicine.   °· You have new symptoms or your symptoms get worse. °· Your pain gets worse.   °· You have belly (abdominal) pain.   °· You are short of breath.   °· You always feel sick to your stomach (nauseous).   °· You keep throwing up (vomiting).   °· You have puffiness (swelling) in your belly.   °· You feel light-headed or you pass out (faint).   °· You have blood in your pee. °· You have a fever or lasting symptoms for more than 2-3 days. °· You have a fever and your symptoms suddenly get worse. °MAKE SURE YOU:  °· Understand these instructions. °· Will watch your condition. °· Will get help right away if you are not doing well or get worse. °  °This information is not intended to replace advice given to you by your health care provider. Make sure you discuss any questions you have with your health care provider. °  °Document Released: 06/17/2008 Document Revised: 09/29/2014 Document Reviewed: 04/22/2012 °Elsevier Interactive Patient Education ©2016 [employer] Inc. ° °

## 2016-03-30 NOTE — ED Provider Notes (Signed)
CSN: [id number]     Arrival date & time 03/30/16  0719 History   First MD Initiated Contact with Patient 03/30/16 [phone]     Chief Complaint  Patient presents with  . Flank Pain     (Consider location/radiation/quality/duration/timing/severity/associated sxs/prior Treatment) HPI   30 year old female with history of recurrent urinary tract infection, kidney infection and hypertension presenting with complaint of left flank pain. Patient reports developing left flank pain, suprapubic pain, and dysuria with dark urine and strong odor for the past 5 days. Pain worsened with movement, and report decrease in urine production. She endorse nausea without vomiting or diarrhea. Denies vaginal bleeding or discharge. Patient report remote history of STD. She is sexually active, does use protection. Last menstrual period was 6/30. She was seen in the ED 4 days ago for her complaint. She was diagnosed with having a urinary tract infection and suspect pyelonephritis. Patient was discharged with Keflex, naproxen, and a short course of Norco. Patient states she has been taking her medication but it provided no improvement at all. She is doing endorsing 10 out of 10 abdominal pain and flank Pain.  Past Medical History  Diagnosis Date  . UTI (lower urinary tract infection)   . BV (bacterial vaginosis)   . UTI (lower urinary tract infection)   . Hypertension   . Kidney infection    Past Surgical History  Procedure Laterality Date  . Cesarean section      x3   Family History  Problem Relation Age of Onset  . Hypertension Mother   . Heart disease Father   . Asthma Brother    Social History  Substance Use Topics  . Smoking status: Current Every Day Smoker -- 0.25 packs/day for 7 years    Types: Cigarettes  . Smokeless tobacco: Current User  . Alcohol Use: No   OB History    Gravida Para Term Preterm AB TAB SAB Ectopic Multiple Living   0 0 3     Review of Systems  All other systems  reviewed and are negative.     Allergies  Review of patient's allergies indicates no known allergies.  Home Medications   Prior to Admission medications   Medication Sig Start Date End Date Taking? Authorizing Provider  cephALEXin (KEFLEX) 500 MG capsule Take 1 capsule (500 mg total) by mouth 3 (three) times daily. 03/26/16 04/09/16 Yes Alvira Monday, MD  ibuprofen (ADVIL,MOTRIN) 200 MG tablet Take 800 mg by mouth every 4 (four) hours as needed (for pain).    Yes Historical Provider, MD  ondansetron (ZOFRAN ODT) 4 MG disintegrating tablet Take 1 tablet (4 mg total) by mouth every 8 (eight) hours as needed for nausea or vomiting. 03/26/16  Yes Alvira Monday, MD  HYDROcodone-acetaminophen (NORCO/VICODIN) 5-325 MG tablet Take 1 tablet by mouth every 4 (four) hours as needed. Patient not taking: Reported on 03/30/2016 03/26/16   Alvira Monday, MD  naproxen (NAPROSYN) 500 MG tablet Take 1 tablet (500 mg total) by mouth 2 (two) times daily with a meal. Patient not taking: Reported on 03/30/2016 03/26/16   Alvira Monday, MD   BP 143/103 mmHg  Pulse 98  Temp(Src) 98.2 F (36.8 C) (Oral)  Resp 16  SpO2 98%  LMP 03/21/2016 (Approximate) Physical Exam  Constitutional: She appears well-developed and well-nourished. No distress.  HENT:  Head: Atraumatic.  Eyes:  Subconjunctival hemorrhage noted in right eye incidentally  Neck: Neck supple.  Cardiovascular: Normal rate and regular rhythm.  Pulmonary/Chest: Effort normal and breath sounds normal.  Abdominal: Soft. There is tenderness (Mild diffuse abdominal tenderness on palpation without focal point tenderness).  CVA tenderness bilaterally  Genitourinary:  Pelvic exam deferred per pt's request  Neurological: She is alert.  Skin: No rash noted.  Psychiatric: She has a normal mood and affect.  Nursing note and vitals reviewed.   ED Course  Procedures (including critical care time) Labs Review Labs Reviewed  CBC WITH  DIFFERENTIAL/PLATELET - Abnormal; Notable for the following:    MCV 74.5 (*)    MCH 24.6 (*)    RDW 17.9 (*)    All other components within normal limits  URINALYSIS, ROUTINE W REFLEX MICROSCOPIC (NOT AT [facility])  RAPID HIV SCREEN (HIV 1/2 AB+AG)  RPR  I-STAT CHEM 8, ED    Imaging Review No results found. I have personally reviewed and evaluated these images and lab results as part of my medical decision-making.   EKG Interpretation None      MDM   Final diagnoses:  Flank pain    BP 143/103 mmHg  Pulse 98  Temp(Src) 98.2 F (36.8 C) (Oral)  Resp 16  SpO2 98%  LMP 03/21/2016 (Approximate)   8:34 AM Patient here with persistent abdominal pain described via an flank pain not improve despite taking Keflex for 4 days for her urinary tract infection. She is currently afebrile with stable vital sign.  8:58 AM UA shows clearing of her urinary tract infection. However patient still continued to endorse flank pain and states that she thinks this is on arthritis. Labs are reassuring. I offer to perform a pelvic exam to rule out STD but patient declines stating that "I have STD before and this does not feel like an STD, this feels like pyelonephritis".    Patient understand that her evaluation will be incomplete without pelvic examination. I encouraged patient to finish her course of Keflex but she voiced concern for possible drug resistance symptoms she has had recurrent pyelonephritis and take the medication has not helped her. Therefore, patient prescribed Bactrim. Return precaution discussed.  Fayrene HelperBowie Alliya Marcon, PA-C 03/30/16 [phone]  Gerhard Munchobert Lockwood, MD 03/30/16 1539

## 2016-03-30 NOTE — ED Notes (Signed)
Pt refused pelvic and is speaking to provider at this time

## 2016-03-30 NOTE — ED Notes (Signed)
Pt was seen three days ago and dx with pyelonephritis.  Pt reports continued left flank pain that hasn't improved despite taking keflex.  Pt a/o x 4 and ambulatory in triage. Rates pain 10/10.

## 2016-03-30 NOTE — ED Notes (Signed)
Per Lisa Vazquez, pt to finish fluid and then d/c home

## 2016-03-31 LAB — RPR: RPR: NONREACTIVE

## 2016-03-31 LAB — URINE CULTURE
Culture: NO GROWTH
SPECIAL REQUESTS: NORMAL

## 2016-05-23 ENCOUNTER — Emergency Department (HOSPITAL_COMMUNITY)
Admission: EM | Admit: 2016-05-23 | Discharge: 2016-05-23 | Disposition: A | Discharge status: Home / Self Care | Payer: Medicaid Other | Attending: Dermatology | Admitting: Dermatology

## 2016-05-23 ENCOUNTER — Encounter (HOSPITAL_COMMUNITY): Payer: Self-pay | Admitting: Emergency Medicine

## 2016-05-23 DIAGNOSIS — F410 Panic disorder [episodic paroxysmal anxiety] without agoraphobia: Secondary | ICD-10-CM | POA: Diagnosis present

## 2016-05-23 DIAGNOSIS — Z5321 Procedure and treatment not carried out due to patient leaving prior to being seen by health care provider: Secondary | ICD-10-CM | POA: Diagnosis not present

## 2016-05-23 DIAGNOSIS — I10 Essential (primary) hypertension: Secondary | ICD-10-CM | POA: Diagnosis not present

## 2016-05-23 DIAGNOSIS — F17210 Nicotine dependence, cigarettes, uncomplicated: Secondary | ICD-10-CM | POA: Insufficient documentation

## 2016-05-23 NOTE — ED Triage Notes (Signed)
Patient presents for anxiety, reports panic attacks x2 days, associated symptoms are diaphoresis, vomiting, chest pain, SOB. A&O x4, respirations even and unlabored, skin warm and day, speaking slowly.

## 2016-05-23 NOTE — ED Notes (Signed)
No answer from waiting room.

## 2016-05-23 NOTE — ED Notes (Signed)
No answer from waiting room, restroom or out front of ED.

## 2016-05-23 NOTE — ED Notes (Signed)
Bed: [facility] Expected date:  Expected time:  Means of arrival:  Comments: 

## 2016-05-28 ENCOUNTER — Encounter (HOSPITAL_COMMUNITY): Payer: Self-pay | Admitting: Certified Registered Nurse Anesthetist

## 2016-05-28 ENCOUNTER — Inpatient Hospital Stay (HOSPITAL_COMMUNITY)
Admission: AD | Admit: 2016-05-28 | Discharge: 2016-05-28 | Disposition: A | Discharge status: Home / Self Care | Payer: Medicaid Other | Source: Ambulatory Visit | Attending: Obstetrics and Gynecology | Admitting: Obstetrics and Gynecology

## 2016-05-28 ENCOUNTER — Encounter (HOSPITAL_COMMUNITY): Payer: Self-pay

## 2016-05-28 DIAGNOSIS — Z79899 Other long term (current) drug therapy: Secondary | ICD-10-CM | POA: Diagnosis not present

## 2016-05-28 DIAGNOSIS — R350 Frequency of micturition: Secondary | ICD-10-CM | POA: Insufficient documentation

## 2016-05-28 DIAGNOSIS — N3001 Acute cystitis with hematuria: Secondary | ICD-10-CM | POA: Diagnosis not present

## 2016-05-28 DIAGNOSIS — R109 Unspecified abdominal pain: Secondary | ICD-10-CM | POA: Diagnosis not present

## 2016-05-28 DIAGNOSIS — F17210 Nicotine dependence, cigarettes, uncomplicated: Secondary | ICD-10-CM | POA: Diagnosis not present

## 2016-05-28 DIAGNOSIS — I10 Essential (primary) hypertension: Secondary | ICD-10-CM | POA: Diagnosis not present

## 2016-05-28 DIAGNOSIS — R110 Nausea: Secondary | ICD-10-CM | POA: Diagnosis not present

## 2016-05-28 LAB — URINE MICROSCOPIC-ADD ON

## 2016-05-28 LAB — URINALYSIS, ROUTINE W REFLEX MICROSCOPIC
Bilirubin Urine: NEGATIVE
GLUCOSE, UA: NEGATIVE mg/dL
KETONES UR: NEGATIVE mg/dL
Nitrite: POSITIVE — AB
PH: 6 (ref 5.0–8.0)
Protein, ur: NEGATIVE mg/dL
SPECIFIC GRAVITY, URINE: 1.025 (ref 1.005–1.030)

## 2016-05-28 LAB — POCT PREGNANCY, URINE: Preg Test, Ur: NEGATIVE

## 2016-05-28 MED ORDER — SODIUM CHLORIDE 0.9 % IV SOLN
INTRAVENOUS | Status: DC
  Administered 2016-05-28: 15:00:00 via INTRAVENOUS

## 2016-05-28 MED ORDER — ONDANSETRON HCL 4 MG/2ML IJ SOLN
4.0000 mg | Freq: Once | INTRAMUSCULAR | Status: AC
  Administered 2016-05-28: 4 mg via INTRAVENOUS
  Filled 2016-05-28: qty 2

## 2016-05-28 MED ORDER — DEXTROSE 5 % IV SOLN
1.0000 g | Freq: Once | INTRAVENOUS | Status: AC
  Administered 2016-05-28: 1 g via INTRAVENOUS
  Filled 2016-05-28: qty 10

## 2016-05-28 MED ORDER — PROMETHAZINE HCL 12.5 MG PO TABS: 12.5000 mg | ORAL_TABLET | Freq: Four times a day (QID) | ORAL | 0 refills | Status: DC | PRN

## 2016-05-28 MED ORDER — HYDROMORPHONE HCL 1 MG/ML IJ SOLN
1.0000 mg | Freq: Once | INTRAMUSCULAR | Status: AC
  Administered 2016-05-28: 1 mg via INTRAVENOUS
  Filled 2016-05-28: qty 1

## 2016-05-28 MED ORDER — SULFAMETHOXAZOLE-TRIMETHOPRIM 800-160 MG PO TABS: 1.0000 | ORAL_TABLET | Freq: Two times a day (BID) | ORAL | 0 refills | Status: DC

## 2016-05-28 NOTE — MAU Provider Note (Signed)
History     CSN: [id number]  Arrival date and time: 05/28/16 1209   First Provider Initiated Contact with Patient 05/28/16 1314      Chief Complaint  Patient presents with  . Flank Pain  . Urinary Frequency   Lisa Vazquez is a 30 y.o. female who presents for flank pain & urinary frequency. Has history of recurrent UTIs & kidney infections. Last kidney infection in July. Was told that she needs a urologist but was never referred to one and doesn't have a PCP.   Flank Pain  This is a new problem. The current episode started yesterday. The problem occurs constantly. The problem is unchanged. Pain location: left flank. The quality of the pain is described as aching and stabbing. The pain does not radiate. The pain is at a severity of 8/10. Pertinent negatives include no abdominal pain, dysuria or fever. (+ urinary frequency) She has tried nothing for the symptoms.  Urinary Frequency   This is a new problem. The current episode started yesterday. The problem occurs every urination. The problem has been unchanged. There has been no fever. Associated symptoms include flank pain, frequency, nausea and vomiting. Pertinent negatives include no chills, discharge, hematuria or possible pregnancy. She has tried nothing for the symptoms. Her past medical history is significant for recurrent UTIs.     OB History    Gravida Para Term Preterm AB Living   4 3 1 2 1 3    SAB TAB Ectopic Multiple Live Births     1 0 0 3      Past Medical History:  Diagnosis Date  . BV (bacterial vaginosis)   . Hypertension   . Kidney infection   . UTI (lower urinary tract infection)     Past Surgical History:  Procedure Laterality Date  . CESAREAN SECTION     x3    Family History  Problem Relation Age of Onset  . Hypertension Mother   . Heart disease Father   . Asthma Brother     Social History  Substance Use Topics  . Smoking status: Current Every Day Smoker    Packs/day: 0.25    Years:  7.00    Types: Cigarettes  . Smokeless tobacco: Current User  . Alcohol use No    Allergies: No Known Allergies  Prescriptions Prior to Admission  Medication Sig Dispense Refill Last Dose  . HYDROcodone-acetaminophen (NORCO/VICODIN) 5-325 MG tablet Take 1 tablet by mouth every 4 (four) hours as needed. (Patient not taking: Reported on 03/30/2016) 6 tablet 0 Completed Course at Unknown time  . ibuprofen (ADVIL,MOTRIN) 200 MG tablet Take 800 mg by mouth every 4 (four) hours as needed (for pain).    Past Week at Unknown time  . naproxen (NAPROSYN) 500 MG tablet Take 1 tablet (500 mg total) by mouth 2 (two) times daily with a meal. 30 tablet 0   . ondansetron (ZOFRAN ODT) 4 MG disintegrating tablet Take 1 tablet (4 mg total) by mouth every 8 (eight) hours as needed for nausea or vomiting. 20 tablet 0 UNKNOWN    Review of Systems  Constitutional: Negative.  Negative for chills and fever.  Gastrointestinal: Positive for nausea and vomiting. Negative for abdominal pain, constipation and diarrhea.  Genitourinary: Positive for flank pain and frequency. Negative for dysuria and hematuria.   Physical Exam   Blood pressure 133/93, pulse 95, temperature 98.2 F (36.8 C), temperature source Oral, resp. rate 18, weight 157 lb (71.2 kg), last menstrual period 04/19/2016.  Physical Exam  Nursing note and vitals reviewed. Constitutional: She is oriented to person, place, and time. She appears well-developed and well-nourished. No distress.  HENT:  Head: Normocephalic and atraumatic.  Eyes: Conjunctivae are normal. Right eye exhibits no discharge. Left eye exhibits no discharge. No scleral icterus.  Neck: Normal range of motion.  Cardiovascular: Normal rate, regular rhythm and normal heart sounds.   No murmur heard. Respiratory: Effort normal and breath sounds normal. No respiratory distress. She has no wheezes.  GI: Soft. There is CVA tenderness (left).  Neurological: She is alert and oriented to  person, place, and time.  Skin: Skin is warm and dry. She is not diaphoretic.  Psychiatric: She has a normal mood and affect. Her behavior is normal. Judgment and thought content normal.    MAU Course  Procedures Results for orders placed or performed during the hospital encounter of 05/28/16 (from the past 24 hour(s))  Urinalysis, Routine w reflex microscopic (not at [facility])     Status: Abnormal   Collection Time: 05/28/16 12:35 PM  Result Value Ref Range   Color, Urine YELLOW YELLOW   APPearance CLEAR CLEAR   Specific Gravity, Urine 1.025 1.005 - 1.030   pH 6.0 5.0 - 8.0   Glucose, UA NEGATIVE NEGATIVE mg/dL   Hgb urine dipstick TRACE (A) NEGATIVE   Bilirubin Urine NEGATIVE NEGATIVE   Ketones, ur NEGATIVE NEGATIVE mg/dL   Protein, ur NEGATIVE NEGATIVE mg/dL   Nitrite POSITIVE (A) NEGATIVE   Leukocytes, UA SMALL (A) NEGATIVE  Urine microscopic-add on     Status: Abnormal   Collection Time: 05/28/16 12:35 PM  Result Value Ref Range   Squamous Epithelial / LPF 0-5 (A) NONE SEEN   WBC, UA 6-30 0 - 5 WBC/hpf   RBC / HPF 0-5 0 - 5 RBC/hpf   Bacteria, UA FEW (A) NONE SEEN  Pregnancy, urine POC     Status: None   Collection Time: 05/28/16 12:41 PM  Result Value Ref Range   Preg Test, Ur NEGATIVE NEGATIVE    MDM UPT negative Rocephin 1 gm IV Zofran 4 mg IV Dilaudid 1 mg IV Urine sent for culture Will send home on PO abx  Pt states needs to go home to take care of her kids; will discharge home  Assessment and Plan  A: 1. Acute cystitis with hematuria   2. Nausea without vomiting     P: Discharge home Rx bactrim & phenergan [facility] primary care for self pay Return to ED for worsening symptoms, fever/chills, unable to tolerate abx Urine culture pending  Lisa Vazquez 05/28/2016, 1:14 PM

## 2016-05-28 NOTE — Discharge Instructions (Signed)
Pyelonephritis, Adult °Pyelonephritis is a kidney infection. The kidneys are the organs that filter a person's blood and move waste out of the bloodstream and into the urine. Urine passes from the kidneys, through the ureters, and into the bladder. There are two main types of pyelonephritis: °· Infections that come on quickly without any warning (acute pyelonephritis). °· Infections that last for a long period of time (chronic pyelonephritis). °In most cases, the infection clears up with treatment and does not cause further problems. More severe infections or chronic infections can sometimes spread to the bloodstream or lead to other problems with the kidneys. °CAUSES °This condition is usually caused by: °· Bacteria traveling from the bladder to the kidney through infected urine. The urine in the bladder can become infected with bacteria from: °· Bladder infection (cystitis). °· Inflammation of the prostate gland (prostatitis). °· Sexual intercourse, in females. °· Bacteria traveling from the bloodstream to the kidney. °RISK FACTORS °This condition is more likely to develop in: °· Pregnant women. °· Older people. °· People who have diabetes. °· People who have kidney stones or bladder stones. °· People who have other abnormalities of the kidney or ureter. °· People who have a catheter placed in the bladder. °· People who have cancer. °· People who are sexually active. °· Women who use spermicides. °· People who have had a prior urinary tract infection. °SYMPTOMS °Symptoms of this condition include: °· Frequent urination. °· Strong or persistent urge to urinate. °· Burning or stinging when urinating. °· Abdominal pain. °· Back pain. °· Pain in the side or flank area. °· Fever. °· Chills. °· Blood in the urine, or dark urine. °· Nausea. °· Vomiting. °DIAGNOSIS °This condition may be diagnosed based on: °· Medical history and physical exam. °· Urine tests. °· Blood tests. °You may also have imaging tests of the  kidneys, such as an ultrasound or CT scan. °TREATMENT °Treatment for this condition may depend on the severity of the infection. °· If the infection is mild and is found early, you may be treated with antibiotic medicines taken by mouth. You will need to drink fluids to remain hydrated. °· If the infection is more severe, you may need to stay in the hospital and receive antibiotics given directly into a vein through an IV tube. You may also need to receive fluids through an IV tube if you are not able to remain hydrated. After your hospital stay, you may need to take oral antibiotics for a period of time. °Other treatments may be required, depending on the cause of the infection. °HOME CARE INSTRUCTIONS °Medicines °· Take over-the-counter and prescription medicines only as told by your health care provider. °· If you were prescribed an antibiotic medicine, take it as told by your health care provider. Do not stop taking the antibiotic even if you start to feel better. °General Instructions °· Drink enough fluid to keep your urine clear or pale yellow. °· Avoid caffeine, tea, and carbonated beverages. They tend to irritate the bladder. °· Urinate often. Avoid holding in urine for long periods of time. °· Urinate before and after sex. °· After a bowel movement, women should cleanse from front to back. Use each tissue only once. °· Keep all follow-up visits as told by your health care provider. This is important. °SEEK MEDICAL CARE IF: °· Your symptoms do not get better after 2 days of treatment. °· Your symptoms get worse. °· You have a fever. °SEEK IMMEDIATE MEDICAL CARE IF: °· You   are unable to take your antibiotics or fluids. °· You have shaking chills. °· You vomit. °· You have severe flank or back pain. °· You have extreme weakness or fainting. °  °This information is not intended to replace advice given to you by your health care provider. Make sure you discuss any questions you have with your health care  provider. °  °Document Released: 09/08/2005 Document Revised: 05/30/2015 Document Reviewed: 01/01/2015 °Elsevier Interactive Patient Education ©2016 Elsevier Inc. ° °Urinary Tract Infection °Urinary tract infections (UTIs) can develop anywhere along your urinary tract. Your urinary tract is your body's drainage system for removing wastes and extra water. Your urinary tract includes two kidneys, two ureters, a bladder, and a urethra. Your kidneys are a pair of bean-shaped organs. Each kidney is about the size of your fist. They are located below your ribs, one on each side of your spine. °CAUSES °Infections are caused by microbes, which are microscopic organisms, including fungi, viruses, and bacteria. These organisms are so small that they can only be seen through a microscope. Bacteria are the microbes that most commonly cause UTIs. °SYMPTOMS  °Symptoms of UTIs may vary by age and gender of the patient and by the location of the infection. Symptoms in young women typically include a frequent and intense urge to urinate and a painful, burning feeling in the bladder or urethra during urination. Older women and men are more likely to be tired, shaky, and weak and have muscle aches and abdominal pain. A fever may mean the infection is in your kidneys. Other symptoms of a kidney infection include pain in your back or sides below the ribs, nausea, and vomiting. °DIAGNOSIS °To diagnose a UTI, your caregiver will ask you about your symptoms. Your caregiver will also ask you to provide a urine sample. The urine sample will be tested for bacteria and white blood cells. White blood cells are made by your body to help fight infection. °TREATMENT  °Typically, UTIs can be treated with medication. Because most UTIs are caused by a bacterial infection, they usually can be treated with the use of antibiotics. The choice of antibiotic and length of treatment depend on your symptoms and the type of bacteria causing your  infection. °HOME CARE INSTRUCTIONS °· If you were prescribed antibiotics, take them exactly as your caregiver instructs you. Finish the medication even if you feel better after you have only taken some of the medication. °· Drink enough water and fluids to keep your urine clear or pale yellow. °· Avoid caffeine, tea, and carbonated beverages. They tend to irritate your bladder. °· Empty your bladder often. Avoid holding urine for long periods of time. °· Empty your bladder before and after sexual intercourse. °· After a bowel movement, women should cleanse from front to back. Use each tissue only once. °SEEK MEDICAL CARE IF:  °· You have back pain. °· You develop a fever. °· Your symptoms do not begin to resolve within 3 days. °SEEK IMMEDIATE MEDICAL CARE IF:  °· You have severe back pain or lower abdominal pain. °· You develop chills. °· You have nausea or vomiting. °· You have continued burning or discomfort with urination. °MAKE SURE YOU:  °· Understand these instructions. °· Will watch your condition. °· Will get help right away if you are not doing well or get worse. °  °This information is not intended to replace advice given to you by your health care provider. Make sure you discuss any questions you have with your   health care provider. °  °Document Released: 06/18/2005 Document Revised: 05/30/2015 Document Reviewed: 10/17/2011 °Elsevier Interactive Patient Education ©2016 Elsevier Inc. ° °

## 2016-05-28 NOTE — MAU Note (Signed)
Having really bad side pain (flank area), started yesterday.  Has missed a period. Urinary frequency, no pain associated .

## 2016-05-30 LAB — URINE CULTURE: Culture: >=100000 — AB

## 2016-06-03 ENCOUNTER — Emergency Department (HOSPITAL_COMMUNITY)
Admission: EM | Admit: 2016-06-03 | Discharge: 2016-06-03 | Disposition: A | Discharge status: Home / Self Care | Payer: Medicaid Other | Attending: Emergency Medicine | Admitting: Emergency Medicine

## 2016-06-03 ENCOUNTER — Encounter (HOSPITAL_COMMUNITY): Payer: Self-pay

## 2016-06-03 ENCOUNTER — Emergency Department (HOSPITAL_COMMUNITY): Payer: Medicaid Other

## 2016-06-03 DIAGNOSIS — Z791 Long term (current) use of non-steroidal anti-inflammatories (NSAID): Secondary | ICD-10-CM | POA: Insufficient documentation

## 2016-06-03 DIAGNOSIS — R102 Pelvic and perineal pain: Secondary | ICD-10-CM | POA: Diagnosis not present

## 2016-06-03 DIAGNOSIS — O26891 Other specified pregnancy related conditions, first trimester: Secondary | ICD-10-CM | POA: Insufficient documentation

## 2016-06-03 DIAGNOSIS — F17210 Nicotine dependence, cigarettes, uncomplicated: Secondary | ICD-10-CM | POA: Diagnosis not present

## 2016-06-03 DIAGNOSIS — Z3A01 Less than 8 weeks gestation of pregnancy: Secondary | ICD-10-CM | POA: Diagnosis not present

## 2016-06-03 DIAGNOSIS — I10 Essential (primary) hypertension: Secondary | ICD-10-CM | POA: Insufficient documentation

## 2016-06-03 DIAGNOSIS — R109 Unspecified abdominal pain: Secondary | ICD-10-CM

## 2016-06-03 DIAGNOSIS — Z3490 Encounter for supervision of normal pregnancy, unspecified, unspecified trimester: Secondary | ICD-10-CM

## 2016-06-03 DIAGNOSIS — R110 Nausea: Secondary | ICD-10-CM | POA: Diagnosis not present

## 2016-06-03 DIAGNOSIS — O99331 Smoking (tobacco) complicating pregnancy, first trimester: Secondary | ICD-10-CM | POA: Insufficient documentation

## 2016-06-03 DIAGNOSIS — Z79899 Other long term (current) drug therapy: Secondary | ICD-10-CM | POA: Diagnosis not present

## 2016-06-03 DIAGNOSIS — O26899 Other specified pregnancy related conditions, unspecified trimester: Secondary | ICD-10-CM

## 2016-06-03 LAB — COMPREHENSIVE METABOLIC PANEL
ALBUMIN: 3.8 g/dL (ref 3.5–5.0)
ALK PHOS: 55 U/L (ref 38–126)
ALT: 16 U/L (ref 14–54)
AST: 27 U/L (ref 15–41)
Anion gap: 7 (ref 5–15)
BILIRUBIN TOTAL: 0.6 mg/dL (ref 0.3–1.2)
BUN: 12 mg/dL (ref 6–20)
CALCIUM: 9.5 mg/dL (ref 8.9–10.3)
CO2: 26 mmol/L (ref 22–32)
CREATININE: 0.99 mg/dL (ref 0.44–1.00)
Chloride: 104 mmol/L (ref 101–111)
GFR calc Af Amer: >60 mL/min (ref >60)
GFR calc non Af Amer: >60 mL/min (ref >60)
GLUCOSE: 136 mg/dL — AB (ref 65–99)
Potassium: 4.1 mmol/L (ref 3.5–5.1)
Sodium: 137 mmol/L (ref 135–145)
TOTAL PROTEIN: 7.5 g/dL (ref 6.5–8.1)

## 2016-06-03 LAB — CBC
HCT: 37.7 % (ref 36.0–46.0)
Hemoglobin: 12.9 g/dL (ref 12.0–15.0)
MCH: 25.3 pg — AB (ref 26.0–34.0)
MCHC: 34.2 g/dL (ref 30.0–36.0)
MCV: 74.1 fL — ABNORMAL LOW (ref 78.0–100.0)
Platelets: 293 10*3/uL (ref 150–400)
RBC: 5.09 MIL/uL (ref 3.87–5.11)
RDW: 15.9 % — AB (ref 11.5–15.5)
WBC: 8 10*3/uL (ref 4.0–10.5)

## 2016-06-03 LAB — URINALYSIS, ROUTINE W REFLEX MICROSCOPIC
BILIRUBIN URINE: NEGATIVE
Glucose, UA: NEGATIVE mg/dL
HGB URINE DIPSTICK: NEGATIVE
KETONES UR: NEGATIVE mg/dL
Leukocytes, UA: NEGATIVE
NITRITE: NEGATIVE
Protein, ur: NEGATIVE mg/dL
Specific Gravity, Urine: 1.02 (ref 1.005–1.030)
pH: 6.5 (ref 5.0–8.0)

## 2016-06-03 LAB — I-STAT BETA HCG BLOOD, ED (MC, WL, AP ONLY): HCG, QUANTITATIVE: 49.4 m[IU]/mL — AB (ref <5)

## 2016-06-03 LAB — LIPASE, BLOOD: Lipase: 19 U/L (ref 11–51)

## 2016-06-03 MED ORDER — SODIUM CHLORIDE 0.9 % IV BOLUS (SEPSIS)
1000.0000 mL | Freq: Once | INTRAVENOUS | Status: AC
  Administered 2016-06-03: 1000 mL via INTRAVENOUS

## 2016-06-03 MED ORDER — PRENATAL VITAMINS 0.8 MG PO TABS: 1.0000 | ORAL_TABLET | Freq: Every day | ORAL | 10 refills | Status: DC

## 2016-06-03 MED ORDER — ACETAMINOPHEN 500 MG PO TABS
1000.0000 mg | ORAL_TABLET | Freq: Once | ORAL | Status: AC
  Administered 2016-06-03: 1000 mg via ORAL
  Filled 2016-06-03: qty 2

## 2016-06-03 NOTE — ED Provider Notes (Signed)
[facility]-EMERGENCY DEPT Provider Note   CSN: [id number] Arrival date & time: 06/03/16  1030     History   Chief Complaint Chief Complaint  Patient presents with  . Abdominal Pain  . Nausea    HPI Lisa Vazquez is a 30 y.o. female.  HPI   Patient with hx urinary and kidney infections with recently diagnosed cystitis (culture grew e.coli sensitive to the bactrim she was given) presents with left sided flank pain.  The pain began approximately 10 days ago, has not improved with medications.  Has associated nausea and subjective fevers, urinary urgency.  Denies vomiting, bowel changes, abnormal vaginal bleeding or discharge.  LMP Aug 20.  G4P3, 2 preterm births at 2828 and 35 weeks due to preeclampsia, 1 full term birth, 1 abortion.  Has had 3 c-sections, no other abdominal surgeries.    Past Medical History:  Diagnosis Date  . BV (bacterial vaginosis)   . Hypertension   . Kidney infection   . UTI (lower urinary tract infection)     Patient Active Problem List   Diagnosis Date Noted  . Bacterial vaginosis 04/06/2013  . Bacterial vaginal infection 04/06/2013  . CIN I (cervical intraepithelial neoplasia I) 04/06/2013  . Atypical squamous cell changes of undetermined significance (ASCUS) on cervical cytology with positive high risk human papilloma virus (HPV) 03/17/2013    Past Surgical History:  Procedure Laterality Date  . CESAREAN SECTION     x3    OB History    Gravida Para Term Preterm AB Living   4 3 1 2 1 3    SAB TAB Ectopic Multiple Live Births     1 0 0 3       Home Medications    Prior to Admission medications   Medication Sig Start Date End Date Taking? Authorizing Provider  ibuprofen (ADVIL,MOTRIN) 200 MG tablet Take 800 mg by mouth every 4 (four) hours as needed for headache, mild pain or moderate pain.    Yes Historical Provider, MD  promethazine (PHENERGAN) 12.5 MG tablet Take 1 tablet (12.5 mg total) by mouth every 6 (six) hours as needed for  nausea or vomiting. 05/28/16  Yes Judeth HornErin Lawrence, NP  Prenatal Multivit-Min-Fe-FA (PRENATAL VITAMINS) 0.8 MG tablet Take 1 tablet by mouth daily. 06/03/16   Trixie DredgeEmily Maddie Brazier, PA-C  sulfamethoxazole-trimethoprim (BACTRIM DS,SEPTRA DS) 800-160 MG tablet Take 1 tablet by mouth 2 (two) times daily. Patient not taking: Reported on 06/03/2016 05/28/16   Judeth HornErin Lawrence, NP    Family History Family History  Problem Relation Age of Onset  . Hypertension Mother   . Heart disease Father   . Asthma Brother     Social History Social History  Substance Use Topics  . Smoking status: Current Every Day Smoker    Packs/day: 0.25    Years: 7.00    Types: Cigarettes  . Smokeless tobacco: Current User  . Alcohol use No     Allergies   Review of patient's allergies indicates no known allergies.   Review of Systems Review of Systems  All other systems reviewed and are negative.    Physical Exam Updated Vital Signs BP 108/68 (BP Location: Right Arm)   Pulse 102   Temp 97.5 F (36.4 C) (Oral)   Resp 16   Ht 5\' 4"  (1.626 m)   Wt 68 kg   LMP 04/19/2016   SpO2 100%   BMI 25.75 kg/m   Physical Exam  Constitutional: She appears well-developed and well-nourished. No distress.  HENT:  Head: Normocephalic and atraumatic.  Neck: Neck supple.  Cardiovascular: Normal rate and regular rhythm.   Pulmonary/Chest: Effort normal and breath sounds normal. No respiratory distress. She has no wheezes. She has no rales.  Abdominal: Soft. She exhibits no distension. There is tenderness. There is no rebound and no guarding.  Diffuse left sided abdominal tenderness   Neurological: She is alert.  Skin: She is not diaphoretic.  Nursing note and vitals reviewed.    ED Treatments / Results  Labs (all labs ordered are listed, but only abnormal results are displayed) Labs Reviewed  COMPREHENSIVE METABOLIC PANEL - Abnormal; Notable for the following:       Result Value   Glucose, Bld 136 (*)    All other  components within normal limits  CBC - Abnormal; Notable for the following:    MCV 74.1 (*)    MCH 25.3 (*)    RDW 15.9 (*)    All other components within normal limits  I-STAT BETA HCG BLOOD, ED ([facility], [facility], [facility] ONLY) - Abnormal; Notable for the following:    I-stat hCG, quantitative 49.4 (*)    All other components within normal limits  WET PREP, GENITAL  URINE CULTURE  LIPASE, BLOOD  URINALYSIS, ROUTINE W REFLEX MICROSCOPIC (NOT AT [facility])  RPR  HIV ANTIBODY (ROUTINE TESTING)  ABO/RH  GC/CHLAMYDIA PROBE AMP ([place]) NOT AT [facility]    EKG  EKG Interpretation None       Radiology US Ob Comp Less 14 Wks  Result Date: 06/03/2016 CLINICAL DATA:  Pregnant patient with kidney infection. EXAM: OBSTETRIC <14 WK [place] AND TRANSVAGINAL OB US TECHNIQUE: Both transabdominal and transvaginal ultrasound examinations were performed for complete evaluation of the gestation as well as the maternal uterus, adnexal regions, and pelvic cul-de-sac. Transvaginal technique was performed to assess early pregnancy. COMPARISON:  None. FINDINGS: No intrauterine pregnancy is identified. Moderate simple fluid is seen in the pelvis. The right ovary is normal in appearance. A 1.9 x 2.3 x 2.0 cm hyperechoic rounded region is seen the left ovary, probably a corpus luteum cyst given pregnancy. No other abnormalities. IMPRESSION: 1. No intrauterine pregnancy is identified. Lack of an IUP in a pregnant patient could be seen with early pregnancy, recent miscarriage, or ectopic pregnancy. There is moderate fluid in the pelvis but it is simple in appearance and nonspecific in etiology. Recommend clinical correlation and follow-up imaging and labs as clinically warranted. Electronically Signed   By: Gerome Sam III M.D   On: 06/03/2016 13:59   US Ob Transvaginal  Result Date: 06/03/2016 CLINICAL DATA:  Pregnant patient with kidney infection. EXAM: OBSTETRIC <14 WK [place] AND TRANSVAGINAL OB US TECHNIQUE: Both transabdominal and  transvaginal ultrasound examinations were performed for complete evaluation of the gestation as well as the maternal uterus, adnexal regions, and pelvic cul-de-sac. Transvaginal technique was performed to assess early pregnancy. COMPARISON:  None. FINDINGS: No intrauterine pregnancy is identified. Moderate simple fluid is seen in the pelvis. The right ovary is normal in appearance. A 1.9 x 2.3 x 2.0 cm hyperechoic rounded region is seen the left ovary, probably a corpus luteum cyst given pregnancy. No other abnormalities. IMPRESSION: 1. No intrauterine pregnancy is identified. Lack of an IUP in a pregnant patient could be seen with early pregnancy, recent miscarriage, or ectopic pregnancy. There is moderate fluid in the pelvis but it is simple in appearance and nonspecific in etiology. Recommend clinical correlation and follow-up imaging and labs as clinically warranted. Electronically Signed   By:  Gerome Sam III M.D   On: 06/03/2016 13:59    Procedures Procedures (including critical care time)  Medications Ordered in ED Medications  sodium chloride 0.9 % bolus 1,000 mL (0 mLs Intravenous Stopped 06/03/16 1701)  acetaminophen (TYLENOL) tablet 1,000 mg (1,000 mg Oral Given 06/03/16 1655)     Initial Impression / Assessment and Plan / ED Course  I have reviewed the triage vital signs and the nursing notes.  Pertinent labs & imaging results that were available during my care of the patient were reviewed by me and considered in my medical decision making (see chart for details).  Clinical Course  Value Comment By Time  I-stat hCG, quantitative: (!) 49.4 (Reviewed) Trixie Dredge, PA-C 09/12 1251   Pt refused pelvic exam  Trixie Dredge, PA-C 09/12 1528    Afebrile nontoxic patient with left flank pain, nausea, urinary urgency.  I-stat HcG 49.  In looking through her records she seems to have current infections and also recurrent abdominal/flank pain complaints without evidence of infection.  Pt  refused pelvic exam.  I discussed with her at length what I would be looking for with the pelvic exam and the concerns that I had for infection, possible ectopic pregnancy.  She is aware that the workup is less complete than I believe is indicated without this exam.  Pt declined.  She is aware that ectopic may be life threatening and she absolutely needs to follow closely with clinic OB or [facility].  Urine does not appear infected. She has likely cleared her recently demonstrated UTI.  US demonstrates left ovarian corpus luteum cyst, small pelvic fluid that is unremarkable, uterus without IUP noted.  HCG is very low, LMP Aug 20, likely extremely early pregnancy.  Pt agrees to close follow up.  Discussed medication safety in pregnancy, tylenol recommended.  Discussed strict return precautions.  Pt eager to go, needing to pick up her children.  D/C home with prenatal vitamins, close OBGYN follow up in clinic or at [facility] recheck.  Pt verbalizes understanding that this recheck is important as we have not completely ruled out ectopic pregnancy.  Though clinically I doubt ruptured ectopic.  Discussed result, findings, treatment, and follow up  with patient.  Pt given return precautions.  Pt verbalizes understanding and agrees with plan.      Final Clinical Impressions(s) / ED Diagnoses   Final diagnoses:  Pelvic pain in pregnancy  Left flank pain  Pregnant    New Prescriptions Discharge Medication List as of 06/03/2016  4:47 PM    START taking these medications   Details  Prenatal Multivit-Min-Fe-FA (PRENATAL VITAMINS) 0.8 MG tablet Take 1 tablet by mouth daily., Starting Tue 06/03/2016, Print         [place], PA-C 06/03/16 1842    Doug Sou, MD 06/04/16 1106

## 2016-06-03 NOTE — ED Notes (Signed)
Pt refuses pelvic exam

## 2016-06-03 NOTE — Progress Notes (Signed)
Pt in imaging at this time  Cm spoke with female visitor He states pt does not have a pcp  CM spoke with pt who [place].  CM discussed and provided written information to assist pt with determining choice for uninsured accepting pcps, discussed the importance of pcp vs EDP services for f/u care, www.needymeds.org, www.goodrx.com, discounted pharmacies and other [place] resources such as [facility] , [facility], affordable care act, financial assistance, uninsured dental services, [place] med assist, DSS and  health department  Reviewed resources for [place] uninsured accepting pcps like [facility], [facility] medicine [facility] [address], [facility], [facility], local urgent care centers, [facility], [facility], general medical clinics, [facility] services of the [place], [facility] urgent care plus others, medication resources, [facility] out patient pharmacies and housing Pt voiced understanding and appreciation of resources provided   Provided [facility] contact information Pt did agreed to a referral

## 2016-06-03 NOTE — Progress Notes (Signed)
Entered in d/c instructions Please use the resources provided to you in emergency room by case manager to assist with doctor for follow up  These [place] uninsured resources provide possible primary care providers, resources for discounted medications, housing, dental resources, affordable care act information, plus [place] resources [place]   [facility]

## 2016-06-03 NOTE — ED Triage Notes (Signed)
Pt here with llq abdominal pain radiating to back.  Pt states anxiety attacks.  States her kidneys are hurting. Decreased urination.  Nausea with no emesis.  Pt denies fever.

## 2016-06-03 NOTE — Discharge Instructions (Signed)
Read the information below.  You may return to the Emergency Department at any time for worsening condition or any new symptoms that concern you.  If you develop high fevers, worsening abdominal pain, uncontrolled vomiting, or are unable to tolerate fluids by mouth, return to the ER for a recheck.   Please call the OBGYN services above tomorrow morning for a close follow up appointment.  If you are unable to see them this week, please go to [facility] for a recheck.

## 2016-06-04 LAB — RPR: RPR Ser Ql: NONREACTIVE

## 2016-06-04 LAB — ABO/RH: ABO/RH(D): A POS

## 2016-06-04 LAB — HIV ANTIBODY (ROUTINE TESTING W REFLEX): HIV SCREEN 4TH GENERATION: NONREACTIVE

## 2016-06-05 LAB — URINE CULTURE

## 2016-06-19 ENCOUNTER — Ambulatory Visit (HOSPITAL_COMMUNITY): Payer: 59

## 2016-07-05 ENCOUNTER — Encounter (HOSPITAL_COMMUNITY): Payer: Self-pay | Admitting: *Deleted

## 2016-07-05 ENCOUNTER — Inpatient Hospital Stay (HOSPITAL_COMMUNITY)
Admission: AD | Admit: 2016-07-05 | Discharge: 2016-07-06 | Disposition: A | Discharge status: Home / Self Care | Payer: Medicaid Other | Source: Ambulatory Visit | Attending: Obstetrics and Gynecology | Admitting: Obstetrics and Gynecology

## 2016-07-05 ENCOUNTER — Inpatient Hospital Stay (HOSPITAL_COMMUNITY): Payer: Medicaid Other

## 2016-07-05 DIAGNOSIS — R109 Unspecified abdominal pain: Secondary | ICD-10-CM | POA: Diagnosis present

## 2016-07-05 DIAGNOSIS — O99331 Smoking (tobacco) complicating pregnancy, first trimester: Secondary | ICD-10-CM | POA: Insufficient documentation

## 2016-07-05 DIAGNOSIS — O26891 Other specified pregnancy related conditions, first trimester: Secondary | ICD-10-CM

## 2016-07-05 DIAGNOSIS — F17210 Nicotine dependence, cigarettes, uncomplicated: Secondary | ICD-10-CM | POA: Insufficient documentation

## 2016-07-05 DIAGNOSIS — R102 Pelvic and perineal pain: Secondary | ICD-10-CM

## 2016-07-05 DIAGNOSIS — O2341 Unspecified infection of urinary tract in pregnancy, first trimester: Secondary | ICD-10-CM | POA: Insufficient documentation

## 2016-07-05 DIAGNOSIS — Z3491 Encounter for supervision of normal pregnancy, unspecified, first trimester: Secondary | ICD-10-CM

## 2016-07-05 DIAGNOSIS — Z3A08 8 weeks gestation of pregnancy: Secondary | ICD-10-CM | POA: Diagnosis not present

## 2016-07-05 LAB — CBC
HEMATOCRIT: 33.5 % — AB (ref 36.0–46.0)
HEMOGLOBIN: 11.3 g/dL — AB (ref 12.0–15.0)
MCH: 25.3 pg — ABNORMAL LOW (ref 26.0–34.0)
MCHC: 33.7 g/dL (ref 30.0–36.0)
MCV: 74.9 fL — AB (ref 78.0–100.0)
Platelets: 240 10*3/uL (ref 150–400)
RBC: 4.47 MIL/uL (ref 3.87–5.11)
RDW: 17.3 % — ABNORMAL HIGH (ref 11.5–15.5)
WBC: 9.1 10*3/uL (ref 4.0–10.5)

## 2016-07-05 LAB — URINE MICROSCOPIC-ADD ON: RBC / HPF: NONE SEEN RBC/hpf (ref 0–5)

## 2016-07-05 LAB — WET PREP, GENITAL
Sperm: NONE SEEN
TRICH WET PREP: NONE SEEN
WBC WET PREP: NONE SEEN
YEAST WET PREP: NONE SEEN

## 2016-07-05 LAB — HCG, QUANTITATIVE, PREGNANCY: hCG, Beta Chain, Quant, S: 66518 m[IU]/mL — ABNORMAL HIGH (ref <5)

## 2016-07-05 LAB — URINALYSIS, ROUTINE W REFLEX MICROSCOPIC
Bilirubin Urine: NEGATIVE
Glucose, UA: NEGATIVE mg/dL
Ketones, ur: NEGATIVE mg/dL
Leukocytes, UA: NEGATIVE
NITRITE: POSITIVE — AB
PH: 5.5 (ref 5.0–8.0)
Protein, ur: NEGATIVE mg/dL
SPECIFIC GRAVITY, URINE: 1.015 (ref 1.005–1.030)

## 2016-07-05 NOTE — MAU Note (Signed)
Pt C/O lower abd & back pain, sharp, for the last week.  Pain became worse around 1800 this evening.  Denies bleeding.

## 2016-07-05 NOTE — MAU Provider Note (Signed)
Chief Complaint: Abdominal Pain and Back Pain    SUBJECTIVE HPI: Lisa Vazquez is a 30 y.o. [phone] at [redacted]w[redacted]d who presents to [facility] reporting sharp abdominal pain.  1 week of pain, intermittent daily. Nothing brings it on, nothing makes it worse, nothing makes it go away. Denies VB. Pain is located in the suprapubic region, radiates to back R>L in low back. Has history of multiple pyelonephritis. Denies fevers/chills. Feels similar to bladder infections.  Has history of 3 cesareans. +frequency of urination, +incomplete voiding, denies dysuria. Denies hematuria. +nausea daily, vomited once today only. Able to hold down food and fluids.    Past Medical History:  Diagnosis Date  . BV (bacterial vaginosis)   . Hypertension   . Kidney infection   . UTI (lower urinary tract infection)    OB History  Gravida Para Term Preterm AB Living  5 3 1 2 1 3   SAB TAB Ectopic Multiple Live Births    1 0 0 3    # Outcome Date GA Lbr Len/2nd Weight Sex Delivery Anes PTL Lv  5 Current           4 Term 09/19/09    M CS-LTranv Spinal  LIV  3 Preterm 04/26/04 [redacted]w[redacted]d   M CS-LTranv Spinal  LIV  2 Preterm 06/30/03 [redacted]w[redacted]d   F CS-LTranv Spinal  LIV  1 TAB              Past Surgical History:  Procedure Laterality Date  . CESAREAN SECTION     x3   Social History   Social History  . Marital status: Single    Spouse name: N/A  . Number of children: N/A  . Years of education: N/A   Occupational History  . Not on file.   Social History Main Topics  . Smoking status: Current Every Day Smoker    Packs/day: 0.25    Years: 7.00    Types: Cigarettes  . Smokeless tobacco: Current User  . Alcohol use No  . Drug use: No  . Sexual activity: Yes    Birth control/ protection: Condom   Other Topics Concern  . Not on file   Social History Narrative  . No narrative on file   No current facility-administered medications on file prior to encounter.    Current Outpatient Prescriptions  on File Prior to Encounter  Medication Sig Dispense Refill  . ibuprofen (ADVIL,MOTRIN) 200 MG tablet Take 800 mg by mouth every 4 (four) hours as needed for headache, mild pain or moderate pain.     . Prenatal Multivit-Min-Fe-FA (PRENATAL VITAMINS) 0.8 MG tablet Take 1 tablet by mouth daily. 30 tablet 10  . promethazine (PHENERGAN) 12.5 MG tablet Take 1 tablet (12.5 mg total) by mouth every 6 (six) hours as needed for nausea or vomiting. 30 tablet 0  . sulfamethoxazole-trimethoprim (BACTRIM DS,SEPTRA DS) 800-160 MG tablet Take 1 tablet by mouth 2 (two) times daily. (Patient not taking: Reported on 06/03/2016) 10 tablet 0   No Known Allergies  I have reviewed the past Medical Hx, Surgical Hx, Social Hx, Allergies and Medications.   REVIEW OF SYSTEMS  A comprehensive ROS was negative except per HPI.      OBJECTIVE Patient Vitals for the past 24 hrs:  BP Temp Temp src Pulse Resp Height Weight  07/05/16 2120 111/81 99.3 F (37.4 C) Oral 81 18 - -  07/05/16 2106 - - - - - 5\' 4"  (1.626 m) 159 lb (72.1 kg)  PHYSICAL EXAM Constitutional: Well-developed, well-nourished female in no acute distress.  Cardiovascular: normal rate Respiratory: normal rate and effort.  GI: Abd soft, non-tender, gravid appropriate for gestational age. Pos BS x 4 MS: Extremities nontender, no edema, normal ROM Neurologic: Alert and oriented x 4.  GU: Neg CVAT.    LAB RESULTS Results for orders placed or performed during the hospital encounter of 07/05/16 (from the past 24 hour(s))  Urinalysis, Routine w reflex microscopic (not at [facility])     Status: Abnormal   Collection Time: 07/05/16  9:05 PM  Result Value Ref Range   Color, Urine YELLOW YELLOW   APPearance CLEAR CLEAR   Specific Gravity, Urine 1.015 1.005 - 1.030   pH 5.5 5.0 - 8.0   Glucose, UA NEGATIVE NEGATIVE mg/dL   Hgb urine dipstick TRACE (A) NEGATIVE   Bilirubin Urine NEGATIVE NEGATIVE   Ketones, ur NEGATIVE NEGATIVE mg/dL   Protein, ur  NEGATIVE NEGATIVE mg/dL   Nitrite POSITIVE (A) NEGATIVE   Leukocytes, UA NEGATIVE NEGATIVE  Urine microscopic-add on     Status: Abnormal   Collection Time: 07/05/16  9:05 PM  Result Value Ref Range   Squamous Epithelial / LPF 0-5 (A) NONE SEEN   WBC, UA 0-5 0 - 5 WBC/hpf   RBC / HPF NONE SEEN 0 - 5 RBC/hpf   Bacteria, UA MANY (A) NONE SEEN   Urine-Other MUCOUS PRESENT   CBC     Status: Abnormal   Collection Time: 07/05/16 10:08 PM  Result Value Ref Range   WBC 9.1 4.0 - 10.5 K/uL   RBC 4.47 3.87 - 5.11 MIL/uL   Hemoglobin 11.3 (L) 12.0 - 15.0 g/dL   HCT 40.9 (L) 81.1 - 91.4 %   MCV 74.9 (L) 78.0 - 100.0 fL   MCH 25.3 (L) 26.0 - 34.0 pg   MCHC 33.7 30.0 - 36.0 g/dL   RDW 78.2 (H) 95.6 - 21.3 %   Platelets 240 150 - 400 K/uL    IMAGING US Ob Transvaginal  Result Date: 07/05/2016 CLINICAL DATA:  Pregnant patient in first-trimester pregnancy with pelvic pain. EXAM: TRANSVAGINAL OB ULTRASOUND TECHNIQUE: Transvaginal ultrasound was performed for complete evaluation of the gestation as well as the maternal uterus, adnexal regions, and pelvic cul-de-sac. COMPARISON:  06/03/2016 FINDINGS: Intrauterine gestational sac: Single Yolk sac:  Visualized. Embryo:  Visualized. Cardiac Activity: Visualized. Heart Rate: 169 bpm CRL:   20  mm   8 w 1 d                  [place] EDC: 02/10/2017 Subchorionic hemorrhage:  None visualized. Maternal uterus/adnexae: Both ovaries are visualized and are normal with blood flow. No pelvic free fluid. IMPRESSION: Single live intrauterine pregnancy estimated gestational age [redacted] weeks 1 day for estimated date of delivery 02/10/2017. No subchorionic hemorrhage. Electronically Signed   By: Rubye Oaks M.D.   On: 07/05/2016 23:27    MAU COURSE SSE SVE TVUS/TAUS Wet Prep BHCG CBC Gc/Ct UA/UCx   MDM Plan of care reviewed with patient, including labs and tests ordered and medical treatment. Patient found to have a IUP with cardiac activity, dating  [redacted]w[redacted]d.   ASSESSMENT 1. Normal intrauterine pregnancy on prenatal ultrasound in first trimester   2. Pelvic pain affecting pregnancy in first trimester, antepartum   3. Urinary tract infection in mother during first trimester of pregnancy     PLAN Discharge home in stable condition. Vaginal bleeding precautions given Antibiotics for UTI Follow up with OB provider for  initial OB visit in 2 weeks     Medication List    STOP taking these medications   ibuprofen 200 MG tablet Commonly known as:  ADVIL,MOTRIN   promethazine 12.5 MG tablet Commonly known as:  PHENERGAN   sulfamethoxazole-trimethoprim 800-160 MG tablet Commonly known as:  BACTRIM DS,SEPTRA DS     TAKE these medications   cephALEXin 500 MG capsule Commonly known as:  KEFLEX Take 1 capsule (500 mg total) by mouth 4 (four) times daily.   Prenatal Vitamins 0.8 MG tablet Take 1 tablet by mouth daily.        [address]lizabeth [place]land [place], [place] 07/05/2016  10:28 PM

## 2016-07-06 ENCOUNTER — Other Ambulatory Visit: Payer: Self-pay | Admitting: Advanced Practice Midwife

## 2016-07-06 DIAGNOSIS — O2341 Unspecified infection of urinary tract in pregnancy, first trimester: Secondary | ICD-10-CM | POA: Diagnosis not present

## 2016-07-06 DIAGNOSIS — R102 Pelvic and perineal pain: Secondary | ICD-10-CM | POA: Diagnosis not present

## 2016-07-06 DIAGNOSIS — O26891 Other specified pregnancy related conditions, first trimester: Secondary | ICD-10-CM | POA: Diagnosis not present

## 2016-07-06 MED ORDER — CEPHALEXIN 500 MG PO CAPS: 500.0000 mg | ORAL_CAPSULE | Freq: Four times a day (QID) | ORAL | 0 refills | Status: DC

## 2016-07-06 MED ORDER — PROMETHAZINE HCL 12.5 MG PO TABS: 12.5000 mg | ORAL_TABLET | Freq: Four times a day (QID) | ORAL | 2 refills | Status: DC | PRN

## 2016-07-06 NOTE — Discharge Instructions (Signed)
Pregnancy and Urinary Tract Infection °A urinary tract infection (UTI) is a bacterial infection of the urinary tract. Infection of the urinary tract can include the ureters, kidneys (pyelonephritis), bladder (cystitis), and urethra (urethritis). All pregnant women should be screened for bacteria in the urinary tract. Identifying and treating a UTI will decrease the risk of preterm labor and developing more serious infections in both the mother and baby. °CAUSES °Bacteria germs cause almost all UTIs.  °RISK FACTORS °Many factors can increase your chances of getting a UTI during pregnancy. These include: °· Having a short urethra. °· Poor toilet and hygiene habits. °· Sexual intercourse. °· Blockage of urine along the urinary tract. °· Problems with the pelvic muscles or nerves. °· Diabetes. °· Obesity. °· Bladder problems after having several children. °· Previous history of UTI. °SIGNS AND SYMPTOMS  °· Pain, burning, or a stinging feeling when urinating. °· Suddenly feeling the need to urinate right away (urgency). °· Loss of bladder control (urinary incontinence). °· Frequent urination, more than is common with pregnancy. °· Lower abdominal or back discomfort. °· Cloudy urine. °· Blood in the urine (hematuria). °· Fever.  °When the kidneys are infected, the symptoms may be: °· Back pain. °· Flank pain on the right side more so than the left. °· Fever. °· Chills. °· Nausea. °· Vomiting. °DIAGNOSIS  °A urinary tract infection is usually diagnosed through urine tests. Additional tests and procedures are sometimes done. These may include: °· Ultrasound exam of the kidneys, ureters, bladder, and urethra. °· Looking in the bladder with a lighted tube (cystoscopy). °TREATMENT °Typically, UTIs can be treated with antibiotic medicines.  °HOME CARE INSTRUCTIONS  °· Only take over-the-counter or prescription medicines as directed by your health care provider. If you were prescribed antibiotics, take them as directed. Finish  them even if you start to feel better. °· Drink enough fluids to keep your urine clear or pale yellow. °· Do not have sexual intercourse until the infection is gone and your health care provider says it is okay. °· Make sure you are tested for UTIs throughout your pregnancy. These infections often come back.  °Preventing a UTI in the Future °· Practice good toilet habits. Always wipe from front to back. Use the tissue only once. °· Do not hold your urine. Empty your bladder as soon as possible when the urge comes. °· Do not douche or use deodorant sprays. °· Wash with soap and warm water around the genital area and the anus. °· Empty your bladder before and after sexual intercourse. °· Wear underwear with a cotton crotch. °· Avoid caffeine and carbonated drinks. They can irritate the bladder. °· Drink cranberry juice or take cranberry pills. This may decrease the risk of getting a UTI. °· Do not drink alcohol. °· Keep all your appointments and tests as scheduled.  °SEEK MEDICAL CARE IF:  °· Your symptoms get worse. °· You are still having fevers 2 or more days after treatment begins. °· You have a rash. °· You feel that you are having problems with medicines prescribed. °· You have abnormal vaginal discharge. °SEEK IMMEDIATE MEDICAL CARE IF:  °· You have back or flank pain. °· You have chills. °· You have blood in your urine. °· You have nausea and vomiting. °· You have contractions of your uterus. °· You have a gush of fluid from the vagina. °MAKE SURE YOU: °· Understand these instructions.   °· Will watch your condition.   °· Will get help right away if you are not doing   well or get worse.    This information is not intended to replace advice given to you by your health care provider. Make sure you discuss any questions you have with your health care provider.   Document Released: 01/03/2011 Document Revised: 06/29/2013 Document Reviewed: 04/07/2013 Elsevier Interactive Patient Education [employer]2016 Else[employer]  Inc[employer]   First Trimester of Pregnancy The first trimester of pregnancy is from week 1 until the end of week 12 (months 1 through 3). A week after a sperm fertilizes an egg, the egg will implant on the wall of the uterus. This embryo will begin to develop into a baby. Genes from you and your partner are forming the baby. The female genes determine whether the baby is a boy or a girl. At 6-8 weeks, the eyes and face are formed, and the heartbeat can be seen on ultrasound. At the end of 12 weeks, all the baby's organs are formed.  Now that you are pregnant, you will want to do everything you can to have a healthy baby. Two of the most important things are to get good prenatal care and to follow your health care provider's instructions. Prenatal care is all the medical care you receive before the baby's birth. This care will help prevent, find, and treat any problems during the pregnancy and childbirth. BODY CHANGES Your body goes through many changes during pregnancy. The changes vary from woman to woman.   You may gain or lose a couple of pounds at first.  You may feel sick to your stomach (nauseous) and throw up (vomit). If the vomiting is uncontrollable, call your health care provider.  You may tire easily.  You may develop headaches that can be relieved by medicines approved by your health care provider.  You may urinate more often. Painful urination may mean you have a bladder infection.  You may develop heartburn as a result of your pregnancy.  You may develop constipation because certain hormones are causing the muscles that push waste through your intestines to slow down.  You may develop hemorrhoids or swollen, bulging veins (varicose veins).  Your breasts may begin to grow larger and become tender. Your nipples may stick out more, and the tissue that surrounds them (areola) may become darker.  Your gums may bleed and may be sensitive to brushing and flossing.  Dark spots or blotches  (chloasma, mask of pregnancy) may develop on your face. This will likely fade after the baby is born.  Your menstrual periods will stop.  You may have a loss of appetite.  You may develop cravings for certain kinds of food.  You may have changes in your emotions from day to day, such as being excited to be pregnant or being concerned that something may go wrong with the pregnancy and baby.  You may have more vivid and strange dreams.  You may have changes in your hair. These can include thickening of your hair, rapid growth, and changes in texture. Some women also have hair loss during or after pregnancy, or hair that feels dry or thin. Your hair will most likely return to normal after your baby is born. WHAT TO EXPECT AT YOUR PRENATAL VISITS During a routine prenatal visit:  You will be weighed to make sure you and the baby are growing normally.  Your blood pressure will be taken.  Your abdomen will be measured to track your baby's growth.  The fetal heartbeat will be listened to starting around week 10 or 12 of your  pregnancy.  Test results from any previous visits will be discussed. Your health care provider may ask you:  How you are feeling.  If you are feeling the baby move.  If you have had any abnormal symptoms, such as leaking fluid, bleeding, severe headaches, or abdominal cramping.  If you are using any tobacco products, including cigarettes, chewing tobacco, and electronic cigarettes.  If you have any questions. Other tests that may be performed during your first trimester include:  Blood tests to find your blood type and to check for the presence of any previous infections. They will also be used to check for low iron levels (anemia) and Rh antibodies. Later in the pregnancy, blood tests for diabetes will be done along with other tests if problems develop.  Urine tests to check for infections, diabetes, or protein in the urine.  An ultrasound to confirm the  proper growth and development of the baby.  An amniocentesis to check for possible genetic problems.  Fetal screens for spina bifida and Down syndrome.  You may need other tests to make sure you and the baby are doing well.  HIV (human immunodeficiency virus) testing. Routine prenatal testing includes screening for HIV, unless you choose not to have this test. HOME CARE INSTRUCTIONS  Medicines  Follow your health care provider's instructions regarding medicine use. Specific medicines may be either safe or unsafe to take during pregnancy.  Take your prenatal vitamins as directed.  If you develop constipation, try taking a stool softener if your health care provider approves. Diet  Eat regular, well-balanced meals. Choose a variety of foods, such as meat or vegetable-based protein, fish, milk and low-fat dairy products, vegetables, fruits, and whole grain breads and cereals. Your health care provider will help you determine the amount of weight gain that is right for you.  Avoid raw meat and uncooked cheese. These carry germs that can cause birth defects in the baby.  Eating four or five small meals rather than three large meals a day may help relieve nausea and vomiting. If you start to feel nauseous, eating a few soda crackers can be helpful. Drinking liquids between meals instead of during meals also seems to help nausea and vomiting.  If you develop constipation, eat more high-fiber foods, such as fresh vegetables or fruit and whole grains. Drink enough fluids to keep your urine clear or pale yellow. Activity and Exercise  Exercise only as directed by your health care provider. Exercising will help you:  Control your weight.  Stay in shape.  Be prepared for labor and delivery.  Experiencing pain or cramping in the lower abdomen or low back is a good sign that you should stop exercising. Check with your health care provider before continuing normal exercises.  Try to avoid  standing for long periods of time. Move your legs often if you must stand in one place for a long time.  Avoid heavy lifting.  Wear low-heeled shoes, and practice good posture.  You may continue to have sex unless your health care provider directs you otherwise. Relief of Pain or Discomfort  Wear a good support bra for breast tenderness.   Take warm sitz baths to soothe any pain or discomfort caused by hemorrhoids. Use hemorrhoid cream if your health care provider approves.   Rest with your legs elevated if you have leg cramps or low back pain.  If you develop varicose veins in your legs, wear support hose. Elevate your feet for 15 minutes, 3-4 times a  day. Limit salt in your diet. Prenatal Care  Schedule your prenatal visits by the twelfth week of pregnancy. They are usually scheduled monthly at first, then more often in the last 2 months before delivery.  Write down your questions. Take them to your prenatal visits.  Keep all your prenatal visits as directed by your health care provider. Safety  Wear your seat belt at all times when driving.  Make a list of emergency phone numbers, including numbers for family, friends, the hospital, and police and fire departments. General Tips  Ask your health care provider for a referral to a local prenatal education class. Begin classes no later than at the beginning of month 6 of your pregnancy.  Ask for help if you have counseling or nutritional needs during pregnancy. Your health care provider can offer advice or refer you to specialists for help with various needs.  Do not use hot tubs, steam rooms, or saunas.  Do not douche or use tampons or scented sanitary pads.  Do not cross your legs for long periods of time.  Avoid cat litter boxes and soil used by cats. These carry germs that can cause birth defects in the baby and possibly loss of the fetus by miscarriage or stillbirth.  Avoid all smoking, herbs, alcohol, and medicines  not prescribed by your health care provider. Chemicals in these affect the formation and growth of the baby.  Do not use any tobacco products, including cigarettes, chewing tobacco, and electronic cigarettes. If you need help quitting, ask your health care provider. You may receive counseling support and other resources to help you quit.  Schedule a dentist appointment. At home, brush your teeth with a soft toothbrush and be gentle when you floss. SEEK MEDICAL CARE IF:   You have dizziness.  You have mild pelvic cramps, pelvic pressure, or nagging pain in the abdominal area.  You have persistent nausea, vomiting, or diarrhea.  You have a bad smelling vaginal discharge.  You have pain with urination.  You notice increased swelling in your face, hands, legs, or ankles. SEEK IMMEDIATE MEDICAL CARE IF:   You have a fever.  You are leaking fluid from your vagina.  You have spotting or bleeding from your vagina.  You have severe abdominal cramping or pain.  You have rapid weight gain or loss.  You vomit blood or material that looks like coffee grounds.  You are exposed to [place] measles and have never had them.  You are exposed to fifth disease or chickenpox.  You develop a severe headache.  You have shortness of breath.  You have any kind of trauma, such as from a fall or a car accident.   This information is not intended to replace advice given to you by your health care provider. Make sure you discuss any questions you have with your health care provider.   Document Released: 09/02/2001 Document Revised: 09/29/2014 Document Reviewed: 07/19/2013 Elsevier Interactive Patient Education [employer]

## 2016-07-07 LAB — GC/CHLAMYDIA PROBE AMP (~~LOC~~) NOT AT ARMC
Chlamydia: NEGATIVE
NEISSERIA GONORRHEA: NEGATIVE

## 2016-07-09 ENCOUNTER — Other Ambulatory Visit: Payer: Self-pay | Admitting: Family Medicine

## 2016-07-09 ENCOUNTER — Encounter: Payer: 59 | Admitting: Family Medicine

## 2016-07-17 ENCOUNTER — Encounter: Payer: Self-pay | Admitting: Advanced Practice Midwife

## 2016-07-17 ENCOUNTER — Ambulatory Visit (INDEPENDENT_AMBULATORY_CARE_PROVIDER_SITE_OTHER): Payer: Medicaid Other | Admitting: Advanced Practice Midwife

## 2016-07-17 VITALS — BP 121/83 | HR 81 | Wt 154.0 lb

## 2016-07-17 DIAGNOSIS — O219 Vomiting of pregnancy, unspecified: Secondary | ICD-10-CM | POA: Diagnosis not present

## 2016-07-17 DIAGNOSIS — Z87440 Personal history of urinary (tract) infections: Secondary | ICD-10-CM

## 2016-07-17 DIAGNOSIS — O10911 Unspecified pre-existing hypertension complicating pregnancy, first trimester: Secondary | ICD-10-CM

## 2016-07-17 DIAGNOSIS — Z3490 Encounter for supervision of normal pregnancy, unspecified, unspecified trimester: Secondary | ICD-10-CM

## 2016-07-17 DIAGNOSIS — R87610 Atypical squamous cells of undetermined significance on cytologic smear of cervix (ASC-US): Secondary | ICD-10-CM

## 2016-07-17 DIAGNOSIS — Z3481 Encounter for supervision of other normal pregnancy, first trimester: Secondary | ICD-10-CM

## 2016-07-17 DIAGNOSIS — O10919 Unspecified pre-existing hypertension complicating pregnancy, unspecified trimester: Secondary | ICD-10-CM | POA: Insufficient documentation

## 2016-07-17 DIAGNOSIS — O0990 Supervision of high risk pregnancy, unspecified, unspecified trimester: Secondary | ICD-10-CM | POA: Insufficient documentation

## 2016-07-17 DIAGNOSIS — R87810 Cervical high risk human papillomavirus (HPV) DNA test positive: Secondary | ICD-10-CM | POA: Diagnosis not present

## 2016-07-17 HISTORY — DX: Unspecified pre-existing hypertension complicating pregnancy, unspecified trimester: O10.919

## 2016-07-17 MED ORDER — PROMETHAZINE HCL 25 MG PO TABS
12.5000 mg | ORAL_TABLET | Freq: Four times a day (QID) | ORAL | 5 refills | Status: DC | PRN
Start: 2016-07-17 — End: 2016-12-22

## 2016-07-17 MED ORDER — CONCEPT OB 130-92.4-1 MG PO CAPS: 1.0000 | ORAL_CAPSULE | Freq: Every day | ORAL | 12 refills | Status: DC

## 2016-07-17 NOTE — Progress Notes (Signed)
  Subjective:    Lisa Vazquez is being seen today for her first obstetrical visit.  She is at 50w5dgestation by LMP/8 week [place] Her obstetrical history is significant for Hx PTD x 2 fow severe Pre-E. Patient does intend to breast feed. Pregnancy history fully reviewed.  Patient reports N/V.  Review of Systems:   Review of Systems  Constitutional: Negative for chills and fever.  Eyes: Negative for visual disturbance.  Gastrointestinal: Positive for nausea and vomiting. Negative for abdominal pain, constipation and diarrhea.  Genitourinary: Negative for vaginal bleeding.  Neurological: Negative for headaches.    Objective:     BP 121/83   Pulse 81   Wt 154 lb (69.9 kg)   LMP 05/10/2016 (Approximate)   BMI 26.43 kg/m  Physical Exam  Constitutional: She is oriented to person, place, and time. She appears well-developed and well-nourished. No distress.  Cardiovascular: Normal rate, regular rhythm and normal heart sounds.   Respiratory: Effort normal and breath sounds normal.  GI: Soft. There is no tenderness.  Neurological: She is alert and oriented to person, place, and time.  Skin: Skin is warm and dry.  Psychiatric: She has a normal mood and affect.    Exam    Assessment:    Pregnancy: [id number] Active Problem List   Diagnosis Date Noted  . Supervision of normal pregnancy, antepartum 07/17/2016  . Chronic hypertension during pregnancy, antepartum 07/17/2016  . Bacterial vaginosis 04/06/2013  . Bacterial vaginal infection 04/06/2013  . CIN I (cervical intraepithelial neoplasia I) 04/06/2013  . Atypical squamous cell changes of undetermined significance (ASCUS) on cervical cytology with positive high risk human papilloma virus (HPV) 03/17/2013     1. Encounter for supervision of high risk pregnancy, antepartum, unspecified gravidity  - Hemoglobinopathy evaluation - Varicella zoster antibody, IgG - VITAMIN D 25 Hydroxy (Vit-D Deficiency, Fractures) -  Culture, OB Urine - Prenatal Profile I - Cystic Fibrosis Mutation 97 - Prenat w/o A Vit-FeFum-FePo-FA (CONCEPT OB) 130-92.4-1 MG CAPS; Take 1 tablet by mouth daily.  Dispense: 30 capsule; Refill: 12 - ToxASSURE Select 13 (MW), Urine  2. Chronic hypertension during pregnancy, antepartum  - Protein / creatinine ratio, urine - Comp Met (CMET)  3. Nausea/vomiting in pregnancy  - promethazine (PHENERGAN) 25 MG tablet; Take 0.5-1 tablets (12.5-25 mg total) by mouth every 6 (six) hours as needed for nausea or vomiting.  Dispense: 30 tablet; Refill: 5  4. Hx LSIL Pap w/ pos HRHPV.  - Colpo postpartum  Plan:     Initial labs drawn. Prenatal vitamins. Problem list reviewed and updated. AFP3 discussed: undecided. Role of ultrasound in pregnancy discussed; fetal survey: requested. Amniocentesis discussed: not indicated. Follow up in 4 weeks. Baseline PCR and CMET  VManya Silvas10/26/2017

## 2016-07-18 LAB — COMPREHENSIVE METABOLIC PANEL
ALBUMIN: 4 g/dL (ref 3.5–5.5)
ALK PHOS: 54 IU/L (ref 39–117)
ALT: 5 IU/L (ref 0–32)
AST: 11 IU/L (ref 0–40)
Albumin/Globulin Ratio: 1.3 (ref 1.2–2.2)
BUN / CREAT RATIO: 14 (ref 9–23)
BUN: 10 mg/dL (ref 6–20)
Bilirubin Total: <0.2 mg/dL (ref 0.0–1.2)
CALCIUM: 9.4 mg/dL (ref 8.7–10.2)
CO2: 24 mmol/L (ref 18–29)
CREATININE: 0.74 mg/dL (ref 0.57–1.00)
Chloride: 98 mmol/L (ref 96–106)
GFR, EST AFRICAN AMERICAN: 125 mL/min/{1.73_m2} (ref >59)
GFR, EST NON AFRICAN AMERICAN: 108 mL/min/{1.73_m2} (ref >59)
GLOBULIN, TOTAL: 3.1 g/dL (ref 1.5–4.5)
GLUCOSE: 68 mg/dL (ref 65–99)
Potassium: 4.1 mmol/L (ref 3.5–5.2)
SODIUM: 137 mmol/L (ref 134–144)
TOTAL PROTEIN: 7.1 g/dL (ref 6.0–8.5)

## 2016-07-18 LAB — PROTEIN / CREATININE RATIO, URINE
CREATININE, UR: 111.5 mg/dL
Protein, Ur: 38.7 mg/dL
Protein/Creat Ratio: 347 mg/g creat — ABNORMAL HIGH (ref 0–200)

## 2016-07-20 LAB — CULTURE, OB URINE

## 2016-07-20 LAB — URINE CULTURE, OB REFLEX

## 2016-07-24 ENCOUNTER — Other Ambulatory Visit: Payer: Self-pay | Admitting: Advanced Practice Midwife

## 2016-07-24 DIAGNOSIS — Z87440 Personal history of urinary (tract) infections: Secondary | ICD-10-CM | POA: Insufficient documentation

## 2016-07-24 DIAGNOSIS — O10919 Unspecified pre-existing hypertension complicating pregnancy, unspecified trimester: Secondary | ICD-10-CM

## 2016-07-24 HISTORY — DX: Personal history of urinary (tract) infections: Z87.440

## 2016-07-24 LAB — PRENATAL PROFILE I(LABCORP)
ANTIBODY SCREEN: NEGATIVE
BASOS: 0 %
Basophils Absolute: 0 10*3/uL (ref 0.0–0.2)
EOS (ABSOLUTE): 0.2 10*3/uL (ref 0.0–0.4)
Eos: 2 %
HEMATOCRIT: 38.5 % (ref 34.0–46.6)
HEP B S AG: NEGATIVE
Hemoglobin: 12.3 g/dL (ref 11.1–15.9)
IMMATURE GRANS (ABS): 0 10*3/uL (ref 0.0–0.1)
Immature Granulocytes: 0 %
LYMPHS: 49 %
Lymphocytes Absolute: 3.7 10*3/uL — ABNORMAL HIGH (ref 0.7–3.1)
MCH: 24.6 pg — ABNORMAL LOW (ref 26.6–33.0)
MCHC: 31.9 g/dL (ref 31.5–35.7)
MCV: 77 fL — AB (ref 79–97)
MONOS ABS: 0.7 10*3/uL (ref 0.1–0.9)
Monocytes: 9 %
NEUTROS ABS: 3.1 10*3/uL (ref 1.4–7.0)
Neutrophils: 40 %
Platelets: 286 10*3/uL (ref 150–379)
RBC: 4.99 x10E6/uL (ref 3.77–5.28)
RDW: 17.4 % — AB (ref 12.3–15.4)
RPR: NONREACTIVE
RUBELLA: 4.8 {index} (ref >0.99)
Rh Factor: POSITIVE
WBC: 7.7 10*3/uL (ref 3.4–10.8)

## 2016-07-24 LAB — VARICELLA ZOSTER ANTIBODY, IGG: Varicella zoster IgG: 1234 index (ref >165)

## 2016-07-24 LAB — CYSTIC FIBROSIS MUTATION 97: Interpretation: NOT DETECTED

## 2016-07-24 LAB — VITAMIN D 25 HYDROXY (VIT D DEFICIENCY, FRACTURES): Vit D, 25-Hydroxy: 12.9 ng/mL — ABNORMAL LOW (ref 30.0–100.0)

## 2016-07-24 LAB — HEMOGLOBINOPATHY EVALUATION
HGB A: 76.2 % — AB (ref 94.0–98.0)
HGB C: 0 %
HGB S: 0 %
Hemoglobin A2 Quantitation: 1.7 % (ref 0.7–3.1)
Hemoglobin F Quantitation: 22.1 % — ABNORMAL HIGH (ref 0.0–2.0)

## 2016-07-24 MED ORDER — ASPIRIN EC 81 MG PO TBEC: 81.0000 mg | DELAYED_RELEASE_TABLET | Freq: Every day | ORAL | 2 refills | Status: DC

## 2016-07-25 LAB — TOXASSURE SELECT 13 (MW), URINE

## 2016-07-26 ENCOUNTER — Encounter: Payer: Self-pay | Admitting: Advanced Practice Midwife

## 2016-07-26 DIAGNOSIS — O99321 Drug use complicating pregnancy, first trimester: Secondary | ICD-10-CM | POA: Insufficient documentation

## 2016-07-26 DIAGNOSIS — D582 Other hemoglobinopathies: Secondary | ICD-10-CM

## 2016-07-26 DIAGNOSIS — F1290 Cannabis use, unspecified, uncomplicated: Secondary | ICD-10-CM

## 2016-07-26 HISTORY — DX: Cannabis use, unspecified, uncomplicated: F12.90

## 2016-07-26 HISTORY — DX: Other hemoglobinopathies: D58.2

## 2016-07-31 ENCOUNTER — Telehealth: Payer: Self-pay | Admitting: *Deleted

## 2016-07-31 NOTE — Telephone Encounter (Signed)
Per message from Lisa Vazquez, CNM need to call patient- she had a positive urine culture . Had been prescribed keflex in MAU from 07/06/16 visit. Ask her if she took it, if she didn't- she needs to take it. If she did take it - will need other medicine- ask Lisa Vazquez what else she wants to order. She also does not have a ROB scheduled. Needs aROB 3 weeks from last visit.  Also tell her she sent rx for baby aspirin for her to start due to her CHTN.    I called Lisa Vazquez and left a message for her that we are calling with some information. Please call our office during office hours.   I am sending a message to registars to schedule her for ROB and to call her with the appt.

## 2016-08-04 ENCOUNTER — Encounter: Payer: Self-pay | Admitting: *Deleted

## 2016-08-04 NOTE — Telephone Encounter (Signed)
Will send a certified letter since we can not reach her.

## 2016-08-04 NOTE — Telephone Encounter (Signed)
I called patient and heard a message this number is no longer in service.

## 2016-08-07 ENCOUNTER — Encounter (HOSPITAL_COMMUNITY): Payer: Self-pay

## 2016-08-07 ENCOUNTER — Inpatient Hospital Stay (HOSPITAL_COMMUNITY)
Admission: AD | Admit: 2016-08-07 | Discharge: 2016-08-08 | Disposition: A | Discharge status: Home / Self Care | Payer: Medicaid Other | Source: Ambulatory Visit | Attending: Obstetrics & Gynecology | Admitting: Obstetrics & Gynecology

## 2016-08-07 DIAGNOSIS — O99321 Drug use complicating pregnancy, first trimester: Secondary | ICD-10-CM | POA: Diagnosis not present

## 2016-08-07 DIAGNOSIS — O10011 Pre-existing essential hypertension complicating pregnancy, first trimester: Secondary | ICD-10-CM | POA: Diagnosis not present

## 2016-08-07 DIAGNOSIS — Z7982 Long term (current) use of aspirin: Secondary | ICD-10-CM | POA: Diagnosis not present

## 2016-08-07 DIAGNOSIS — M549 Dorsalgia, unspecified: Secondary | ICD-10-CM | POA: Diagnosis present

## 2016-08-07 DIAGNOSIS — F17210 Nicotine dependence, cigarettes, uncomplicated: Secondary | ICD-10-CM | POA: Diagnosis not present

## 2016-08-07 DIAGNOSIS — F1910 Other psychoactive substance abuse, uncomplicated: Secondary | ICD-10-CM | POA: Insufficient documentation

## 2016-08-07 DIAGNOSIS — O99331 Smoking (tobacco) complicating pregnancy, first trimester: Secondary | ICD-10-CM | POA: Diagnosis not present

## 2016-08-07 DIAGNOSIS — N3000 Acute cystitis without hematuria: Secondary | ICD-10-CM | POA: Diagnosis not present

## 2016-08-07 DIAGNOSIS — O2311 Infections of bladder in pregnancy, first trimester: Secondary | ICD-10-CM | POA: Diagnosis not present

## 2016-08-07 DIAGNOSIS — Z3A12 12 weeks gestation of pregnancy: Secondary | ICD-10-CM

## 2016-08-07 DIAGNOSIS — N3090 Cystitis, unspecified without hematuria: Secondary | ICD-10-CM | POA: Diagnosis not present

## 2016-08-07 DIAGNOSIS — O10919 Unspecified pre-existing hypertension complicating pregnancy, unspecified trimester: Secondary | ICD-10-CM

## 2016-08-07 LAB — URINALYSIS, ROUTINE W REFLEX MICROSCOPIC
BILIRUBIN URINE: NEGATIVE
Glucose, UA: NEGATIVE mg/dL
KETONES UR: NEGATIVE mg/dL
Leukocytes, UA: NEGATIVE
Nitrite: POSITIVE — AB
PROTEIN: NEGATIVE mg/dL
Specific Gravity, Urine: >1.03 — ABNORMAL HIGH (ref 1.005–1.030)
pH: 6 (ref 5.0–8.0)

## 2016-08-07 LAB — URINE MICROSCOPIC-ADD ON

## 2016-08-07 NOTE — MAU Provider Note (Signed)
History     CSN: [id number]  Arrival date and time: 08/07/16 2306   First Provider Initiated Contact with Patient 08/07/16 2340      Chief Complaint  Patient presents with  . Back Pain   Lisa Vazquez is a 30 y.o. [phone] at 1263w6d who presents today with back pain, and she states that she "knows that she has a UTI." Patient states that she has had 2 UTIs recently, and has taken the prescriptions that were given. She also has hip pain, and she feels this may be from her mattress.    Back Pain  This is a new problem. The current episode started today. The problem occurs constantly. The problem is unchanged. The pain is present in the lumbar spine. Radiates to: hips  The pain is at a severity of 7/10. The symptoms are aggravated by position. Pertinent negatives include no abdominal pain, dysuria or fever.   Past Medical History:  Diagnosis Date  . BV (bacterial vaginosis)   . Hypertension   . Kidney infection   . UTI (lower urinary tract infection)     Past Surgical History:  Procedure Laterality Date  . CESAREAN SECTION     x3    Family History  Problem Relation Age of Onset  . Hypertension Mother   . Heart disease Father   . Asthma Brother     Social History  Substance Use Topics  . Smoking status: Current Every Day Smoker    Packs/day: 0.25    Years: 7.00    Types: Cigarettes  . Smokeless tobacco: Former NeurosurgeonUser     Comment: 3 cigs/day, has used vape pens  . Alcohol use No    Allergies: No Known Allergies  Prescriptions Prior to Admission  Medication Sig Dispense Refill Last Dose  . aspirin EC 81 MG tablet Take 1 tablet (81 mg total) by mouth daily. Take after 12 weeks for prevention of preeclampssia later in pregnancy 300 tablet 2   . cephALEXin (KEFLEX) 500 MG capsule Take 1 capsule (500 mg total) by mouth 4 (four) times daily. 28 capsule 0 Taking  . Prenat w/o A Vit-FeFum-FePo-FA (CONCEPT OB) 130-92.4-1 MG CAPS Take 1 tablet by mouth daily. 30 capsule  12   . Prenatal Multivit-Min-Fe-FA (PRENATAL VITAMINS) 0.8 MG tablet Take 1 tablet by mouth daily. (Patient not taking: Reported on 07/17/2016) 30 tablet 10 Not Taking  . promethazine (PHENERGAN) 25 MG tablet Take 0.5-1 tablets (12.5-25 mg total) by mouth every 6 (six) hours as needed for nausea or vomiting. 30 tablet 5     Review of Systems  Constitutional: Negative for chills and fever.  Gastrointestinal: Positive for nausea and vomiting. Negative for abdominal pain, constipation and diarrhea.  Genitourinary: Positive for urgency. Negative for dysuria and frequency.       Odor to urine.   Musculoskeletal: Positive for back pain.   Physical Exam   Blood pressure 115/79, pulse 104, temperature 98.5 F (36.9 C), temperature source Oral, resp. rate 17, height 5\' 4"  (1.626 m), weight 157 lb (71.2 kg), last menstrual period 05/10/2016, SpO2 100 %.  Physical Exam  Nursing note and vitals reviewed. Constitutional: She is oriented to person, place, and time. She appears well-developed and well-nourished. No distress.  HENT:  Head: Normocephalic.  Cardiovascular: Normal rate.   Respiratory: Effort normal.  GI: Soft. There is no tenderness. There is no rebound.  Genitourinary:  Genitourinary Comments: No CVA tenderness   Neurological: She is alert and oriented to person, place,  and time.  Skin: Skin is warm and dry.  Psychiatric: She has a normal mood and affect.   Results for orders placed or performed during the hospital encounter of 08/07/16 (from the past 24 hour(s))  Urinalysis, Routine w reflex microscopic (not [facility])     Status: Abnormal   Collection Time: 08/07/16 11:16 PM  Result Value Ref Range   Color, Urine YELLOW YELLOW   APPearance CLEAR CLEAR   Specific Gravity, Urine >1.030 (H) 1.005 - 1.030   pH 6.0 5.0 - 8.0   Glucose, UA NEGATIVE NEGATIVE mg/dL   Hgb urine dipstick SMALL (A) NEGATIVE   Bilirubin Urine NEGATIVE NEGATIVE   Ketones, ur NEGATIVE NEGATIVE mg/dL    Protein, ur NEGATIVE NEGATIVE mg/dL   Nitrite POSITIVE (A) NEGATIVE   Leukocytes, UA NEGATIVE NEGATIVE  Urine microscopic-add on     Status: Abnormal   Collection Time: 08/07/16 11:16 PM  Result Value Ref Range   Squamous Epithelial / LPF 0-5 (A) NONE SEEN   WBC, UA 6-30 0 - 5 WBC/hpf   RBC / HPF 0-5 0 - 5 RBC/hpf   Bacteria, UA MANY (A) NONE SEEN   Urine-Other MUCOUS PRESENT     MAU Course  Procedures  MDM   Assessment and Plan   1. Acute cystitis without hematuria   2. 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 Chronic hypertension during pregnancy, antepartum   4. [redacted] weeks gestation of pregnancy    DC home Comfort measures reviewed  2nd Trimester precautions  UC pending  RX: keflex QID x 7 days, natachew daily, advised to pick up ASA rx and start ASAP  Return to MAU as needed FU with OB as planned  Follow-up Information    [facility] Follow up.   Contact information: [address] [place] [phone]           Tawnya CrookHogan, Heather Donovan 08/07/2016, 11:41 PM

## 2016-08-07 NOTE — MAU Note (Signed)
Pt reports bilateral hip pain for the last 3 days. States she has tried tylenol and heat with no relief. Also reports her urine has a really strong odor.

## 2016-08-08 DIAGNOSIS — N3000 Acute cystitis without hematuria: Secondary | ICD-10-CM

## 2016-08-08 MED ORDER — CEPHALEXIN 500 MG PO CAPS: 500.0000 mg | ORAL_CAPSULE | Freq: Four times a day (QID) | ORAL | 0 refills | Status: DC

## 2016-08-08 MED ORDER — NATACHEW 28-1 MG PO CHEW: 1.0000 | CHEWABLE_TABLET | Freq: Every day | ORAL | 11 refills | Status: DC

## 2016-08-08 NOTE — Discharge Instructions (Signed)
Pregnancy and Urinary Tract Infection °WHAT IS A URINARY TRACT INFECTION? °A urinary tract infection (UTI) is an infection of any part of the urinary tract. This includes the kidneys, the tubes that connect your kidneys to your bladder (ureters), the bladder, and the tube that carries urine out of your body (urethra). These organs make, store, and get rid of urine in the body. A UTI can be a bladder infection (cystitis) or a kidney infection (pyelonephritis). This infection may be caused by fungi, viruses, and bacteria. Bacteria are the most common cause of UTIs. °You are more likely to develop a UTI during pregnancy because: °· The physical and hormonal changes your body goes through can make it easier for bacteria to get into your urinary tract. °· Your growing baby puts pressure on your uterus and can affect urine flow. °DOES A UTI PLACE MY BABY AT RISK? °An untreated UTI during pregnancy could lead to a kidney infection, which can cause health problems that could affect your baby. Possible complications of an untreated UTI include: °· Having your baby before 37 weeks of pregnancy (premature). °· Having a baby with a low birth weight. °· Developing high blood pressure during pregnancy (preeclampsia). °WHAT ARE THE SYMPTOMS OF A UTI? °Symptoms of a UTI include: °· Fever. °· Frequent urination or passing small amounts of urine frequently. °· Needing to urinate urgently. °· Pain or a burning sensation with urination. °· Urine that smells bad or unusual. °· Cloudy urine. °· Pain in the lower abdomen or back. °· Trouble urinating. °· Blood in the urine. °· Vomiting or being less hungry than normal. °· Diarrhea or abdominal pain. °· Vaginal discharge. °WHAT ARE THE TREATMENT OPTIONS FOR A UTI DURING PREGNANCY? °Treatment for this condition may include: °· Antibiotic medicines that are safe to take during pregnancy. °· Other medicines to treat less common causes of UTI. °HOW CAN I PREVENT A UTI? °To prevent a UTI: °· Go  to the bathroom as soon as you feel the need. °· Always wipe from front to back. °· Wash your genital area with soap and warm water daily. °· Empty your bladder before and after sex. °· Wear cotton underwear. °· Limit your intake of high sugar foods or drinks, such as regular soda, juice, and sweets.. °· Drink 6-8 glasses of water daily. °· Do not wear tight-fitting pants. °· Do not douche or use deodorant sprays. °· Do not drink alcohol, caffeine, or carbonated drinks. These can irritate the bladder. °WHEN SHOULD I SEEK MEDICAL CARE? °Seek medical care if: °· Your symptoms do not improve or get worse. °· You have a fever after two days of treatment. °· You have a rash. °· You have abnormal vaginal discharge. °· You have back or side pain. °· You have chills. °· You have nausea and vomiting. °WHEN SHOULD I SEEK IMMEDIATE MEDICAL CARE? °Seek immediate medical care if you are pregnant and: °· You feel contractions in your uterus. °· You have lower belly pain. °· You have a gush of fluid from your vagina. °· You have blood in your urine. °· You are vomiting and cannot keep down any medicines or water. °This information is not intended to replace advice given to you by your health care provider. Make sure you discuss any questions you have with your health care provider. °Document Released: 01/03/2011 Document Revised: 02/11/2016 Document Reviewed: 07/30/2015 °Elsevier Interactive Patient Education © 2017 [employer] Inc[employer] ° °

## 2016-08-10 LAB — CULTURE, OB URINE: Culture: >=100000 — AB

## 2016-08-19 ENCOUNTER — Encounter (HOSPITAL_COMMUNITY): Payer: Self-pay | Admitting: *Deleted

## 2016-08-19 ENCOUNTER — Ambulatory Visit (INDEPENDENT_AMBULATORY_CARE_PROVIDER_SITE_OTHER): Payer: Medicaid Other | Admitting: Obstetrics & Gynecology

## 2016-08-19 ENCOUNTER — Inpatient Hospital Stay (HOSPITAL_COMMUNITY): Payer: Medicaid Other

## 2016-08-19 ENCOUNTER — Inpatient Hospital Stay (HOSPITAL_COMMUNITY)
Admission: AD | Admit: 2016-08-19 | Discharge: 2016-08-19 | Disposition: A | Discharge status: Home / Self Care | Payer: Medicaid Other | Source: Ambulatory Visit | Attending: Family Medicine | Admitting: Family Medicine

## 2016-08-19 VITALS — BP 105/69 | HR 82

## 2016-08-19 DIAGNOSIS — Z79899 Other long term (current) drug therapy: Secondary | ICD-10-CM | POA: Diagnosis not present

## 2016-08-19 DIAGNOSIS — Z3480 Encounter for supervision of other normal pregnancy, unspecified trimester: Secondary | ICD-10-CM

## 2016-08-19 DIAGNOSIS — F17210 Nicotine dependence, cigarettes, uncomplicated: Secondary | ICD-10-CM | POA: Diagnosis not present

## 2016-08-19 DIAGNOSIS — O26892 Other specified pregnancy related conditions, second trimester: Secondary | ICD-10-CM | POA: Insufficient documentation

## 2016-08-19 DIAGNOSIS — Z7982 Long term (current) use of aspirin: Secondary | ICD-10-CM | POA: Insufficient documentation

## 2016-08-19 DIAGNOSIS — Z87440 Personal history of urinary (tract) infections: Secondary | ICD-10-CM | POA: Diagnosis not present

## 2016-08-19 DIAGNOSIS — O2342 Unspecified infection of urinary tract in pregnancy, second trimester: Secondary | ICD-10-CM

## 2016-08-19 DIAGNOSIS — Z3482 Encounter for supervision of other normal pregnancy, second trimester: Secondary | ICD-10-CM

## 2016-08-19 DIAGNOSIS — M545 Low back pain: Secondary | ICD-10-CM | POA: Diagnosis present

## 2016-08-19 DIAGNOSIS — Z3A15 15 weeks gestation of pregnancy: Secondary | ICD-10-CM | POA: Insufficient documentation

## 2016-08-19 DIAGNOSIS — R3129 Other microscopic hematuria: Secondary | ICD-10-CM

## 2016-08-19 DIAGNOSIS — O219 Vomiting of pregnancy, unspecified: Secondary | ICD-10-CM | POA: Diagnosis not present

## 2016-08-19 DIAGNOSIS — O99332 Smoking (tobacco) complicating pregnancy, second trimester: Secondary | ICD-10-CM | POA: Diagnosis not present

## 2016-08-19 DIAGNOSIS — R109 Unspecified abdominal pain: Secondary | ICD-10-CM

## 2016-08-19 LAB — URINALYSIS, ROUTINE W REFLEX MICROSCOPIC
BILIRUBIN URINE: NEGATIVE
Glucose, UA: NEGATIVE mg/dL
Ketones, ur: NEGATIVE mg/dL
Leukocytes, UA: NEGATIVE
Nitrite: NEGATIVE
PH: 6 (ref 5.0–8.0)
Protein, ur: NEGATIVE mg/dL
SPECIFIC GRAVITY, URINE: 1.025 (ref 1.005–1.030)

## 2016-08-19 LAB — URINE MICROSCOPIC-ADD ON: WBC, UA: NONE SEEN WBC/hpf (ref 0–5)

## 2016-08-19 MED ORDER — CYCLOBENZAPRINE HCL 10 MG PO TABS
10.0000 mg | ORAL_TABLET | Freq: Once | ORAL | Status: AC
  Administered 2016-08-19: 10 mg via ORAL
  Filled 2016-08-19: qty 1

## 2016-08-19 MED ORDER — IBUPROFEN 800 MG PO TABS
400.0000 mg | ORAL_TABLET | Freq: Once | ORAL | Status: AC
  Administered 2016-08-19: 400 mg via ORAL
  Filled 2016-08-19: qty 1

## 2016-08-19 MED ORDER — CYCLOBENZAPRINE HCL 10 MG PO TABS: 10.0000 mg | ORAL_TABLET | Freq: Two times a day (BID) | ORAL | 0 refills | Status: DC | PRN

## 2016-08-19 MED ORDER — NITROFURANTOIN MONOHYD MACRO 100 MG PO CAPS: 100.0000 mg | ORAL_CAPSULE | Freq: Every day | ORAL | 0 refills | Status: DC

## 2016-08-19 NOTE — Progress Notes (Signed)
Patient complains of pain down the middle of her spine, along with abdominal pain, denies bleeding and reports feeling fetal flutter movements.

## 2016-08-19 NOTE — Discharge Instructions (Signed)
Pregnancy and Urinary Tract Infection °WHAT IS A URINARY TRACT INFECTION? °A urinary tract infection (UTI) is an infection of any part of the urinary tract. This includes the kidneys, the tubes that connect your kidneys to your bladder (ureters), the bladder, and the tube that carries urine out of your body (urethra). These organs make, store, and get rid of urine in the body. A UTI can be a bladder infection (cystitis) or a kidney infection (pyelonephritis). This infection may be caused by fungi, viruses, and bacteria. Bacteria are the most common cause of UTIs. °You are more likely to develop a UTI during pregnancy because: °· The physical and hormonal changes your body goes through can make it easier for bacteria to get into your urinary tract. °· Your growing baby puts pressure on your uterus and can affect urine flow. °DOES A UTI PLACE MY BABY AT RISK? °An untreated UTI during pregnancy could lead to a kidney infection, which can cause health problems that could affect your baby. Possible complications of an untreated UTI include: °· Having your baby before 37 weeks of pregnancy (premature). °· Having a baby with a low birth weight. °· Developing high blood pressure during pregnancy (preeclampsia). °WHAT ARE THE SYMPTOMS OF A UTI? °Symptoms of a UTI include: °· Fever. °· Frequent urination or passing small amounts of urine frequently. °· Needing to urinate urgently. °· Pain or a burning sensation with urination. °· Urine that smells bad or unusual. °· Cloudy urine. °· Pain in the lower abdomen or back. °· Trouble urinating. °· Blood in the urine. °· Vomiting or being less hungry than normal. °· Diarrhea or abdominal pain. °· Vaginal discharge. °WHAT ARE THE TREATMENT OPTIONS FOR A UTI DURING PREGNANCY? °Treatment for this condition may include: °· Antibiotic medicines that are safe to take during pregnancy. °· Other medicines to treat less common causes of UTI. °HOW CAN I PREVENT A UTI? °To prevent a UTI: °· Go  to the bathroom as soon as you feel the need. °· Always wipe from front to back. °· Wash your genital area with soap and warm water daily. °· Empty your bladder before and after sex. °· Wear cotton underwear. °· Limit your intake of high sugar foods or drinks, such as regular soda, juice, and sweets.. °· Drink 6-8 glasses of water daily. °· Do not wear tight-fitting pants. °· Do not douche or use deodorant sprays. °· Do not drink alcohol, caffeine, or carbonated drinks. These can irritate the bladder. °WHEN SHOULD I SEEK MEDICAL CARE? °Seek medical care if: °· Your symptoms do not improve or get worse. °· You have a fever after two days of treatment. °· You have a rash. °· You have abnormal vaginal discharge. °· You have back or side pain. °· You have chills. °· You have nausea and vomiting. °WHEN SHOULD I SEEK IMMEDIATE MEDICAL CARE? °Seek immediate medical care if you are pregnant and: °· You feel contractions in your uterus. °· You have lower belly pain. °· You have a gush of fluid from your vagina. °· You have blood in your urine. °· You are vomiting and cannot keep down any medicines or water. °This information is not intended to replace advice given to you by your health care provider. Make sure you discuss any questions you have with your health care provider. °Document Released: 01/03/2011 Document Revised: 02/11/2016 Document Reviewed: 07/30/2015 °Elsevier Interactive Patient Education © 2017 [employer] Inc[employer] ° °

## 2016-08-19 NOTE — Progress Notes (Signed)
C/o right flank pain for 3 days   PRENATAL VISIT NOTE  Subjective:  Lisa Vazquez is a 30 y.o. [phone] at 6126w0d being seen today for ongoing prenatal care.  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  Patient reports contractions since flank and low abd pain, took meds for UTI 1 week ago and no fever.   .  .  Movement: Present. Denies leaking of fluid.   The following portions of the patient's history were reviewed and updated as appropriate: allergies, current medications, past family history, past medical history, past social history, past surgical history and problem list. Problem list updated.  Objective:   Vitals:   08/19/16 1046  BP: 105/69  Pulse: 82    Fetal Status: Fetal Heart Rate (bpm): 156   Movement: Present     General:  Alert, oriented and cooperative. Patient is in no acute distress.  Skin: Skin is warm and dry. No rash noted.   Cardiovascular: Normal heart rate noted  Respiratory: Normal respiratory effort, no problems with respiration noted  Abdomen: Soft, gravid, appropriate for gestational age. Pain/Pressure: Present     Pelvic:  Cervical exam deferred        Extremities: Normal range of motion.  Edema: None  Mental Status: Normal mood and affect. Normal behavior. Normal judgment and thought content.   Assessment and Plan:  Pregnancy: [id number] at 6926w0d  1. Urinary tract infection in mother during second trimester of pregnancy Right CVAT  2. Supervision of other normal  pregnancy, antepartum   Preterm labor symptoms and general obstetric precautions including but not limited to vaginal bleeding, contractions, leaking of fluid and fetal movement were reviewed in detail with the patient. Please refer to After Visit Summary for other counseling recommendations.  Return in about 1 week (around 08/26/2016). To MAU to evaluate for possible pyelonephritis  Adam PhenixJames G Arnold, MD

## 2016-08-19 NOTE — MAU Note (Signed)
Pain in low back, esp left, started 3 days ago. Steadily worsened.  Woke her during the night.  Noted pelvic pressure and blood in her urine this morning.

## 2016-08-19 NOTE — MAU Provider Note (Signed)
History     CSN: [id number]  Arrival date and time: 08/19/16 1324    First Provider Initiated Contact with Patient 08/19/16 1456      Chief Complaint  Patient presents with  . Back Pain  . Hematuria  . pelvic pressure   HPI Lisa Vazquez is a 30 y.o. [id number] at 4279w0d who presents from office for evaluation of recurrent UTIs & flank pain. Patient has history of recurrent UTIs & kidney infection. Most recently completed course of keflex for UTI last week. Reports left lower back/flank pain x 3 days that is constant & sharp. Rates pain 7/10. Has not treated. Sitting upright helps with pain. Lying down or moving makes pain worse. Saw some blood in toilet this morning x 1 episode; thinks was urinary in origin as opposed to vaginal, but unsure. Endorses n/v throughout pregnancy; no changes and no vomiting today. Denies fever/chills, dysuria, increased urinary frequency, or abdominal pain. [facility]old [facility] [place] & sent here to r/o pyelo.   OB History    Gravida Para Term Preterm AB Living   5 3 1 2 1 3    SAB TAB Ectopic Multiple Live Births     1 0 0 3      Past Medical History:  Diagnosis Date  . BV (bacterial vaginosis)   . Hypertension   . Kidney infection   . UTI (lower urinary tract infection)     Past Surgical History:  Procedure Laterality Date  . CESAREAN SECTION     x3    Family History  Problem Relation Age of Onset  . Hypertension Mother   . Heart disease Father   . Asthma Brother     Social History  Substance Use Topics  . Smoking status: Current Every Day Smoker    Packs/day: 0.25    Years: 7.00    Types: Cigarettes  . Smokeless tobacco: Former NeurosurgeonUser     Comment: 3 cigs/day, has used vape pens  . Alcohol use No    Allergies: No Known Allergies  Prescriptions Prior to Admission  Medication Sig Dispense Refill Last Dose  . aspirin EC 81 MG tablet Take 1 tablet (81 mg total) by mouth daily. Take after 12 weeks for prevention of  preeclampssia later in pregnancy 300 tablet 2   . cephALEXin (KEFLEX) 500 MG capsule Take 1 capsule (500 mg total) by mouth 4 (four) times daily. 28 capsule 0   . Prenat w/o A Vit-FeFum-FePo-FA (CONCEPT OB) 130-92.4-1 MG CAPS Take 1 tablet by mouth daily. 30 capsule 12   . Prenatal Multivit-Min-Fe-FA (PRENATAL VITAMINS) 0.8 MG tablet Take 1 tablet by mouth daily. (Patient not taking: Reported on 07/17/2016) 30 tablet 10 Not Taking  . Prenatal Vit-Fe Fum-Fe Bisg-FA (NATACHEW) 28-1 MG CHEW Chew 1 tablet by mouth daily. 30 tablet 11   . promethazine (PHENERGAN) 25 MG tablet Take 0.5-1 tablets (12.5-25 mg total) by mouth every 6 (six) hours as needed for nausea or vomiting. 30 tablet 5     Review of Systems  Constitutional: Negative for chills and fever.  Gastrointestinal: Positive for nausea and vomiting. Negative for abdominal pain, constipation and diarrhea.  Genitourinary: Positive for flank pain and hematuria. Negative for dysuria and frequency.  Musculoskeletal: Positive for back pain.   Physical Exam   Blood pressure 106/64, pulse 81, temperature 98.9 F (37.2 C), temperature source Oral, resp. rate 16, weight 158 lb 9.6 oz (71.9 kg), last menstrual period 05/10/2016.  Physical Exam  Nursing note and vitals reviewed. Constitutional: She is oriented to person, place, and time. She appears well-developed and well-nourished. No distress.  HENT:  Head: Normocephalic and atraumatic.  Eyes: Conjunctivae are normal. Right eye exhibits no discharge. Left eye exhibits no discharge. No scleral icterus.  Neck: Normal range of motion.  Cardiovascular: Normal rate, regular rhythm and normal heart sounds.   No murmur heard. Respiratory: Effort normal and breath sounds normal. No respiratory distress. She has no wheezes.  GI: Soft. There is no CVA tenderness.  Genitourinary: Cervix exhibits no motion tenderness.  Genitourinary Comments: Cervix closed  Musculoskeletal:       Lumbar back: She  exhibits pain. She exhibits no tenderness, no swelling, no edema and no deformity.  Neurological: She is alert and oriented to person, place, and time.  Skin: Skin is warm and dry. She is not diaphoretic.  Psychiatric: She has a normal mood and affect. Her behavior is normal. Judgment and thought content normal.    MAU Course  Procedures Results for orders placed or performed during the hospital encounter of 08/19/16 (from the past 24 hour(s))  Urinalysis, Routine w reflex microscopic (not [facility])     Status: Abnormal   Collection Time: 08/19/16  1:57 PM  Result Value Ref Range   Color, Urine YELLOW YELLOW   APPearance CLEAR CLEAR   Specific Gravity, Urine 1.025 1.005 - 1.030   pH 6.0 5.0 - 8.0   Glucose, UA NEGATIVE NEGATIVE mg/dL   Hgb urine dipstick SMALL (A) NEGATIVE   Bilirubin Urine NEGATIVE NEGATIVE   Ketones, ur NEGATIVE NEGATIVE mg/dL   Protein, ur NEGATIVE NEGATIVE mg/dL   Nitrite NEGATIVE NEGATIVE   Leukocytes, UA NEGATIVE NEGATIVE  Urine microscopic-add on     Status: Abnormal   Collection Time: 08/19/16  1:57 PM  Result Value Ref Range   Squamous Epithelial / LPF 0-5 (A) NONE SEEN   WBC, UA NONE SEEN 0 - 5 WBC/hpf   RBC / HPF 0-5 0 - 5 RBC/hpf   Bacteria, UA FEW (A) NONE SEEN   Urine-Other MUCOUS PRESENT    [place] Renal  Result Date: 08/19/2016 CLINICAL DATA:  Hematuria. Flank pain. Low back pain left worse than right. Three days duration. Fifteen weeks pregnant. EXAM: RENAL / URINARY TRACT ULTRASOUND COMPLETE COMPARISON:  CT 01/19/2016.  Ultrasound 08/26/2015 FINDINGS: Right Kidney: Length: 12.2 cm. Echogenicity within normal limits. No mass or hydronephrosis visualized. Left Kidney: Length: 11.2 cm. Echogenicity within normal limits. No mass or hydronephrosis visualized. Bladder: Appears normal for degree of bladder distention. Ureteral jet visualized on the left. Right ureteral jet not visualized. Fifteen week gravid uterus, not evaluated in any detail. IMPRESSION:  Normal appearance of the kidneys.  No hydronephrosis. Electronically Signed   By: Paulina FusiMark  Shogry M.D.   On: 08/19/2016 16:39    MDM FHT 145 by doppler VSS, afebrile Normal renal ultrasound Urine sent for culture Ibuprofen 400 mg & flexeril 10 mg PO Pt reports some improvement in symptoms Assessment and Plan  A; 1. Recurrent UTI (urinary tract infection) complicating pregnancy, second trimester   2. Flank pain   3. Hematuria, microscopic    P: Discharge home Urine culture pending Rx macrobid for UTI supression [facility] to [facility] [place] clinical pool for referral to urology Discussed reasons to return to MAU   Judeth HornErin Ashauna Bertholf 08/19/2016, 2:49 PM

## 2016-08-19 NOTE — Progress Notes (Signed)
Patient is in the office related to abdominal pain.

## 2016-08-20 LAB — CULTURE, OB URINE: CULTURE: NO GROWTH

## 2016-08-26 ENCOUNTER — Encounter: Payer: Medicaid Other | Admitting: Obstetrics & Gynecology

## 2016-09-10 ENCOUNTER — Encounter: Payer: Self-pay | Admitting: Obstetrics

## 2016-09-10 ENCOUNTER — Encounter: Payer: Medicaid Other | Admitting: Obstetrics

## 2016-09-10 ENCOUNTER — Ambulatory Visit (INDEPENDENT_AMBULATORY_CARE_PROVIDER_SITE_OTHER): Payer: Medicaid Other | Admitting: Obstetrics

## 2016-09-10 VITALS — BP 122/77 | HR 96 | Wt 164.5 lb

## 2016-09-10 DIAGNOSIS — Z3480 Encounter for supervision of other normal pregnancy, unspecified trimester: Secondary | ICD-10-CM

## 2016-09-10 DIAGNOSIS — O24410 Gestational diabetes mellitus in pregnancy, diet controlled: Secondary | ICD-10-CM | POA: Diagnosis not present

## 2016-09-10 NOTE — Progress Notes (Signed)
  Subjective:    Asencion IslamDominique M Beason is a 30 y.o. female being seen today for her obstetrical visit. She is at 10684w1d gestation. Patient reports: no complaints.  Problem List Items Addressed This Visit    Supervision of normal pregnancy, antepartum     Patient Active Problem List   Diagnosis Date Noted  . Marijuana use 07/26/2016  . 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 High blood hemoglobin F (HCC) 07/26/2016  . History of recurrent UTIs 07/24/2016  . Supervision of normal pregnancy, antepartum 07/17/2016  . Chronic hypertension during pregnancy, antepartum 07/17/2016  . CIN I (cervical intraepithelial neoplasia I) 04/06/2013  . Atypical squamous cell changes of undetermined significance (ASCUS) on cervical cytology with positive high risk human papilloma virus (HPV) 03/17/2013    Objective:     BP 122/77   Pulse 96   Wt 164 lb 8 oz (74.6 kg)   LMP 05/10/2016 (Approximate)   BMI 28.24 kg/m  Uterine Size: Below umbilicus     Assessment:    Pregnancy @ 2684w1d  weeks Doing well    Plan:    Problem list reviewed and updated. Labs reviewed.  Follow up in 4 weeks. FIRST/CF mutation testing/NIPT/QUAD SCREEN/fragile X/Ashkenazi Jewish population testing/Spinal muscular atrophy discussed: requested. Role of ultrasound in pregnancy discussed; fetal survey: requested. Amniocentesis discussed: not indicated. 50% of 15 minute visit spent on counseling and coordination of care.  Patient ID: Asencion IslamDominique M Zaucha, female   DOB: [birth year], 30 y.o.   MRN: [id number]

## 2016-09-11 ENCOUNTER — Other Ambulatory Visit: Payer: Medicaid Other

## 2016-09-11 DIAGNOSIS — Z3480 Encounter for supervision of other normal pregnancy, unspecified trimester: Secondary | ICD-10-CM

## 2016-09-11 NOTE — Addendum Note (Signed)
Addended by: Maretta BeesMCGLASHAN, Jamira Barfuss J on: 09/11/2016 08:35 AM   Modules accepted: Orders

## 2016-09-25 ENCOUNTER — Ambulatory Visit (HOSPITAL_COMMUNITY)
Admission: RE | Admit: 2016-09-25 | Discharge: 2016-09-25 | Disposition: A | Discharge status: Home / Self Care | Payer: Medicaid Other | Source: Ambulatory Visit | Attending: Obstetrics | Admitting: Obstetrics

## 2016-09-25 DIAGNOSIS — Z3A19 19 weeks gestation of pregnancy: Secondary | ICD-10-CM | POA: Diagnosis not present

## 2016-09-25 DIAGNOSIS — F1910 Other psychoactive substance abuse, uncomplicated: Secondary | ICD-10-CM | POA: Insufficient documentation

## 2016-09-25 DIAGNOSIS — O10012 Pre-existing essential hypertension complicating pregnancy, second trimester: Secondary | ICD-10-CM | POA: Diagnosis not present

## 2016-09-25 DIAGNOSIS — Z3480 Encounter for supervision of other normal pregnancy, unspecified trimester: Secondary | ICD-10-CM

## 2016-09-25 DIAGNOSIS — O99322 Drug use complicating pregnancy, second trimester: Secondary | ICD-10-CM | POA: Diagnosis present

## 2016-10-01 ENCOUNTER — Ambulatory Visit (INDEPENDENT_AMBULATORY_CARE_PROVIDER_SITE_OTHER): Payer: Medicaid Other | Admitting: Obstetrics

## 2016-10-01 ENCOUNTER — Encounter: Payer: Self-pay | Admitting: Obstetrics

## 2016-10-01 VITALS — BP 118/79 | HR 88 | Temp 97.5°F | Wt 169.0 lb

## 2016-10-01 DIAGNOSIS — Z3482 Encounter for supervision of other normal pregnancy, second trimester: Secondary | ICD-10-CM

## 2016-10-01 DIAGNOSIS — Z87440 Personal history of urinary (tract) infections: Secondary | ICD-10-CM

## 2016-10-01 DIAGNOSIS — Z3480 Encounter for supervision of other normal pregnancy, unspecified trimester: Secondary | ICD-10-CM

## 2016-10-01 LAB — AFP, QUAD SCREEN
DIA Mom Value: 1.63
DIA Value (EIA): 272.41 pg/mL
DSR (BY AGE) 1 IN: 485
DSR (Second Trimester) 1 IN: 4364
GESTATIONAL AGE AFP: 18.3 wk
MATERNAL AGE AT EDD: 32.5 a
MSAFP MOM: 0.92
MSAFP: 39 ng/mL
MSHCG MOM: 0.41
MSHCG: 10353 m[IU]/mL
OSB RISK: 10000
Test Results:: NEGATIVE
UE3 MOM: 0.81
UE3 VALUE: 1.12 ng/mL
Weight: 164 [lb_av]

## 2016-10-01 LAB — POCT URINALYSIS DIPSTICK
Bilirubin, UA: NEGATIVE
GLUCOSE UA: NEGATIVE
Ketones, UA: NEGATIVE
Leukocytes, UA: NEGATIVE
Nitrite, UA: NEGATIVE
SPEC GRAV UA: 1.01
UROBILINOGEN UA: 0.2
pH, UA: 6

## 2016-10-01 NOTE — Progress Notes (Signed)
Patient is having severe lower back pain and lower abdominal pain. She is taking her UTI suppression.

## 2016-10-01 NOTE — Progress Notes (Signed)
Subjective:    Asencion IslamDominique M Dittrich is a 31 y.o. female being seen today for her obstetrical visit. She is at 1726w1d gestation. Patient reports: no complaints . Fetal movement: normal.  Problem List Items Addressed This Visit    History of recurrent UTIs - Primary   Relevant Orders   POCT urinalysis dipstick (Completed)   Culture, OB Urine   Ambulatory referral to Urology     Patient Active Problem List   Diagnosis Date Noted  . Marijuana use 07/26/2016  . 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 High blood hemoglobin F (HCC) 07/26/2016  . History of recurrent UTIs 07/24/2016  . Supervision of normal pregnancy, antepartum 07/17/2016  . Chronic hypertension during pregnancy, antepartum 07/17/2016  . CIN I (cervical intraepithelial neoplasia I) 04/06/2013  . Atypical squamous cell changes of undetermined significance (ASCUS) on cervical cytology with positive high risk human papilloma virus (HPV) 03/17/2013   Objective:    BP 118/79   Pulse 88   Temp 97.5 F (36.4 C)   Wt 169 lb (76.7 kg)   LMP 05/10/2016 (Approximate)   BMI 29.01 kg/m  FHT: 150 BPM  Uterine Size: size equals dates     Assessment:    Pregnancy @ 1226w1d     History of recurrent UTI's  Severe right flank pain today   Urine dip clean.  Plan:    Urology referral ordered  [facility]  Signs and symptoms of preterm labor: discussed.  Labs, problem list reviewed and updated 2 hr GTT planned Follow up in 2 weeks.  Patient ID: Asencion IslamDominique M Champine, female   DOB: [birth year], 31 y.o.   MRN: [id number]

## 2016-10-06 LAB — URINE CULTURE, OB REFLEX

## 2016-10-06 LAB — CULTURE, OB URINE

## 2016-10-08 ENCOUNTER — Encounter: Payer: Medicaid Other | Admitting: Obstetrics

## 2016-10-16 ENCOUNTER — Ambulatory Visit (INDEPENDENT_AMBULATORY_CARE_PROVIDER_SITE_OTHER): Payer: Medicaid Other | Admitting: Obstetrics and Gynecology

## 2016-10-16 VITALS — BP 125/78 | HR 81 | Wt 172.0 lb

## 2016-10-16 DIAGNOSIS — Z3480 Encounter for supervision of other normal pregnancy, unspecified trimester: Secondary | ICD-10-CM

## 2016-10-16 DIAGNOSIS — Z87440 Personal history of urinary (tract) infections: Secondary | ICD-10-CM

## 2016-10-16 DIAGNOSIS — O99322 Drug use complicating pregnancy, second trimester: Secondary | ICD-10-CM

## 2016-10-16 DIAGNOSIS — O10919 Unspecified pre-existing hypertension complicating pregnancy, unspecified trimester: Secondary | ICD-10-CM

## 2016-10-16 DIAGNOSIS — O99321 Drug use complicating pregnancy, first trimester: Secondary | ICD-10-CM

## 2016-10-16 DIAGNOSIS — O10912 Unspecified pre-existing hypertension complicating pregnancy, second trimester: Secondary | ICD-10-CM | POA: Diagnosis not present

## 2016-10-16 NOTE — Progress Notes (Signed)
   PRENATAL VISIT NOTE  Subjective:  Asencion IslamDominique M Cervone is a 31 y.o. [id number] at 5831w2d being seen today for ongoing prenatal care.  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  Patient reports no complaints.  Contractions: Irritability. Vag. Bleeding: None.  Movement: Present. Denies leaking of fluid.   The following portions of the patient's history were reviewed and updated as appropriate: allergies, current medications, past family history, past medical history, past social history, past surgical history and problem list. Problem list updated.  Objective:   Vitals:   10/16/16 0949  BP: 125/78  Pulse: 81  Weight: 172 lb (78 kg)    Fetal Status: Fetal Heart Rate (bpm): 142 Fundal Height: 23 cm Movement: Present     General:  Alert, oriented and cooperative. Patient is in no acute distress.  Skin: Skin is warm and dry. No rash noted.   Cardiovascular: Normal heart rate noted  Respiratory: Normal respiratory effort, no problems with respiration noted  Abdomen: Soft, gravid, appropriate for gestational age. Pain/Pressure: Absent     Pelvic:  Cervical exam deferred        Extremities: Normal range of motion.  Edema: None  Mental Status: Normal mood and affect. Normal behavior. Normal judgment and thought content.   Assessment and Plan:  Pregnancy: [id number] at 6631w2d  1. Supervision of other normal pregnancy, antepartum Patient is doing well Follow up ultrasound for incomplete anatomy ordered Third  trimester labs next visit Patient still undecided on contraception - US MFM OB FOLLOW UP; Future  2. Chronic hypertension during pregnancy, antepartum Continue daily ASA - US MFM OB FOLLOW UP; Future  3. 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 History of recurrent UTIs Continue daily macrobid for supression Urology appointment in the process   Preterm labor symptoms and general obstetric precautions including but not limited to vaginal bleeding, contractions, leaking of fluid and fetal movement were reviewed in detail with the patient. Please refer to After Visit Summary for other counseling recommendations.  Return in about 4 weeks (around 11/13/2016) for ROB and 2 hr glucola.   Catalina AntiguaPeggy Lealon Vanputten, MD

## 2016-10-16 NOTE — Progress Notes (Signed)
Patient reports good fetal movement, complains of back pain.

## 2016-10-29 ENCOUNTER — Ambulatory Visit (HOSPITAL_COMMUNITY): Payer: Medicaid Other

## 2016-10-31 ENCOUNTER — Ambulatory Visit (HOSPITAL_COMMUNITY)
Admission: RE | Admit: 2016-10-31 | Discharge: 2016-10-31 | Disposition: A | Discharge status: Home / Self Care | Payer: Medicaid Other | Source: Ambulatory Visit | Attending: Obstetrics and Gynecology | Admitting: Obstetrics and Gynecology

## 2016-10-31 DIAGNOSIS — O99322 Drug use complicating pregnancy, second trimester: Secondary | ICD-10-CM | POA: Diagnosis not present

## 2016-10-31 DIAGNOSIS — F1910 Other psychoactive substance abuse, uncomplicated: Secondary | ICD-10-CM | POA: Diagnosis not present

## 2016-10-31 DIAGNOSIS — Z3A25 25 weeks gestation of pregnancy: Secondary | ICD-10-CM | POA: Diagnosis not present

## 2016-10-31 DIAGNOSIS — O10012 Pre-existing essential hypertension complicating pregnancy, second trimester: Secondary | ICD-10-CM | POA: Diagnosis not present

## 2016-10-31 DIAGNOSIS — Z3689 Encounter for other specified antenatal screening: Secondary | ICD-10-CM | POA: Insufficient documentation

## 2016-10-31 DIAGNOSIS — O10919 Unspecified pre-existing hypertension complicating pregnancy, unspecified trimester: Secondary | ICD-10-CM

## 2016-10-31 DIAGNOSIS — Z3480 Encounter for supervision of other normal pregnancy, unspecified trimester: Secondary | ICD-10-CM

## 2016-11-05 ENCOUNTER — Encounter (HOSPITAL_COMMUNITY): Payer: Self-pay

## 2016-11-05 ENCOUNTER — Inpatient Hospital Stay (HOSPITAL_COMMUNITY): Payer: Medicaid Other

## 2016-11-05 ENCOUNTER — Inpatient Hospital Stay (HOSPITAL_COMMUNITY)
Admission: AD | Admit: 2016-11-05 | Discharge: 2016-11-05 | Disposition: A | Discharge status: Home / Self Care | Payer: Medicaid Other | Source: Ambulatory Visit | Attending: Obstetrics and Gynecology | Admitting: Obstetrics and Gynecology

## 2016-11-05 DIAGNOSIS — Z87891 Personal history of nicotine dependence: Secondary | ICD-10-CM | POA: Diagnosis not present

## 2016-11-05 DIAGNOSIS — Z3A26 26 weeks gestation of pregnancy: Secondary | ICD-10-CM | POA: Insufficient documentation

## 2016-11-05 DIAGNOSIS — F1910 Other psychoactive substance abuse, uncomplicated: Secondary | ICD-10-CM | POA: Diagnosis not present

## 2016-11-05 DIAGNOSIS — O9989 Other specified diseases and conditions complicating pregnancy, childbirth and the puerperium: Secondary | ICD-10-CM | POA: Diagnosis not present

## 2016-11-05 DIAGNOSIS — O99322 Drug use complicating pregnancy, second trimester: Secondary | ICD-10-CM | POA: Diagnosis not present

## 2016-11-05 DIAGNOSIS — N3001 Acute cystitis with hematuria: Secondary | ICD-10-CM | POA: Diagnosis not present

## 2016-11-05 DIAGNOSIS — O99321 Drug use complicating pregnancy, first trimester: Secondary | ICD-10-CM

## 2016-11-05 DIAGNOSIS — R05 Cough: Secondary | ICD-10-CM | POA: Diagnosis not present

## 2016-11-05 DIAGNOSIS — O10919 Unspecified pre-existing hypertension complicating pregnancy, unspecified trimester: Secondary | ICD-10-CM

## 2016-11-05 DIAGNOSIS — R1030 Lower abdominal pain, unspecified: Secondary | ICD-10-CM | POA: Diagnosis present

## 2016-11-05 DIAGNOSIS — R059 Cough, unspecified: Secondary | ICD-10-CM

## 2016-11-05 DIAGNOSIS — Z7982 Long term (current) use of aspirin: Secondary | ICD-10-CM | POA: Diagnosis not present

## 2016-11-05 DIAGNOSIS — O10012 Pre-existing essential hypertension complicating pregnancy, second trimester: Secondary | ICD-10-CM | POA: Insufficient documentation

## 2016-11-05 LAB — URINALYSIS, ROUTINE W REFLEX MICROSCOPIC
Bilirubin Urine: NEGATIVE
GLUCOSE, UA: NEGATIVE mg/dL
KETONES UR: 20 mg/dL — AB
Nitrite: NEGATIVE
PROTEIN: NEGATIVE mg/dL
Specific Gravity, Urine: 1.009 (ref 1.005–1.030)
pH: 5 (ref 5.0–8.0)

## 2016-11-05 LAB — INFLUENZA PANEL BY PCR (TYPE A & B)
INFLAPCR: NEGATIVE
INFLBPCR: NEGATIVE

## 2016-11-05 LAB — CBC
HCT: 31.3 % — ABNORMAL LOW (ref 36.0–46.0)
Hemoglobin: 10.6 g/dL — ABNORMAL LOW (ref 12.0–15.0)
MCH: 25.7 pg — ABNORMAL LOW (ref 26.0–34.0)
MCHC: 33.9 g/dL (ref 30.0–36.0)
MCV: 76 fL — AB (ref 78.0–100.0)
PLATELETS: 202 10*3/uL (ref 150–400)
RBC: 4.12 MIL/uL (ref 3.87–5.11)
RDW: 15.2 % (ref 11.5–15.5)
WBC: 16.4 10*3/uL — ABNORMAL HIGH (ref 4.0–10.5)

## 2016-11-05 MED ORDER — CEPHALEXIN 500 MG PO CAPS: 500.0000 mg | ORAL_CAPSULE | Freq: Four times a day (QID) | ORAL | 0 refills | Status: DC

## 2016-11-05 NOTE — MAU Note (Addendum)
Pt presents to MAU with complaints of body aches, fever, and pain in the right lower side of her abdomen. Declined FLU shot previously

## 2016-11-05 NOTE — MAU Provider Note (Signed)
History     CSN: [id number]  Arrival date and time: 11/05/16 1021   None     Chief Complaint  Patient presents with  . Abdominal Pain  . Generalized Body Aches  . Fever   Patient is a 31 year old G5 P3 at 26 weeks and 1 day who presents to the MA U with less than 24 hours of fatigue subjective fever productive cough mild chest pain diffuse body aches and malaise. She denies any sick contacts. She reports she is feeling well until she woke up with these symptoms this morning. She does report some urgency with urination but no burning. She also reports "she feels like she has to push to [place]" she reports she has been taking her Macrobid as prescribed.    OB History    Gravida Para Term Preterm AB Living   5 3 1 2 1 3    SAB TAB Ectopic Multiple Live Births     1 0 0 3      Past Medical History:  Diagnosis Date  . BV (bacterial vaginosis)   . Hypertension   . Kidney infection   . UTI (lower urinary tract infection)     Past Surgical History:  Procedure Laterality Date  . CESAREAN SECTION     x3    Family History  Problem Relation Age of Onset  . Hypertension Mother   . Heart disease Father   . Asthma Brother     Social History  Substance Use Topics  . Smoking status: Former Smoker    Packs/day: 0.25    Years: 7.00    Types: Cigarettes  . Smokeless tobacco: Former Neurosurgeon     Comment: 3 cigs/day, has used vape pens  . Alcohol use No    Allergies: No Known Allergies  Prescriptions Prior to Admission  Medication Sig Dispense Refill Last Dose  . Prenat w/o A Vit-FeFum-FePo-FA (CONCEPT OB) 130-92.4-1 MG CAPS Take 1 tablet by mouth daily. 30 capsule 12 11/04/2016 at Unknown time  . promethazine (PHENERGAN) 25 MG tablet Take 0.5-1 tablets (12.5-25 mg total) by mouth every 6 (six) hours as needed for nausea or vomiting. 30 tablet 5 Past Week at Unknown time  . aspirin EC 81 MG tablet Take 1 tablet (81 mg total) by mouth daily. Take after 12 weeks for prevention of  preeclampssia later in pregnancy (Patient not taking: Reported on 10/16/2016) 300 tablet 2 Not Taking at Unknown time  . cyclobenzaprine (FLEXERIL) 10 MG tablet Take 1 tablet (10 mg total) by mouth 2 (two) times daily as needed for muscle spasms. (Patient not taking: Reported on 10/01/2016) 10 tablet 0 Not Taking at Unknown time  . nitrofurantoin, macrocrystal-monohydrate, (MACROBID) 100 MG capsule Take 1 capsule (100 mg total) by mouth at bedtime. (Patient not taking: Reported on 11/05/2016) 30 capsule 0 Completed Course at Unknown time    Review of Systems  Constitutional: Positive for chills and fever.  HENT: Positive for congestion and sore throat.   Respiratory: Positive for choking and shortness of breath.   Cardiovascular: Positive for chest pain and palpitations.  Genitourinary: Positive for difficulty urinating. Negative for frequency, hematuria and urgency.  Musculoskeletal: Positive for back pain and myalgias.   Physical Exam   Blood pressure 124/76, pulse 101, temperature 99.2 F (37.3 C), resp. rate 18, height 5\' 4"  (1.626 m), weight 177 lb (80.3 kg), last menstrual period 05/10/2016, SpO2 99 %.  Physical Exam  Constitutional: She is oriented to person, place, and time. She appears well-developed  and well-nourished.  HENT:  Head: Normocephalic and atraumatic.  Cardiovascular: Normal rate, regular rhythm, normal heart sounds and intact distal pulses.   Respiratory:  No increased respiratory effort, normal pulse oximetry, rhonchi in the right upper lung fields, good air movement throughout  GI: Soft. Bowel sounds are normal. There is no tenderness. There is no rebound.  Musculoskeletal: Normal range of motion. She exhibits no edema.  Neurological: She is alert and oriented to person, place, and time. No cranial nerve deficit.  Skin: Skin is warm and dry.  Psychiatric: She has a normal mood and affect.    MAU Course  Procedures  MDM In MA U patient underwent evaluation with  chest x-ray which was a normal, flu swab which was negative, CBC evaluation which revealed elevated white count to 16 which could be a normal variant of pregnancy. Additionally she had a urinalysis which did appear to be infected. It was sent for culture. Her Macrobid was stopped and patient was placed on Keflex 500 mg 4 times a day. She was given supportive care instructions for her viral syndrome.  Assessment and Plan  #1: UTI urine culture sent*Keflex 500 mg 4 times a day #2: Viral syndrome advise supportive care.  Ernestina Pennaicholas Jakaree Pickard 11/05/2016, 1:19 PM

## 2016-11-05 NOTE — Discharge Instructions (Signed)
Urinary Tract Infection, Adult Introduction A urinary tract infection (UTI) is an infection of any part of the urinary tract. The urinary tract includes the:  Kidneys.  Ureters.  Bladder.  Urethra. These organs make, store, and get rid of pee (urine) in the body. Follow these instructions at home:  Take over-the-counter and prescription medicines only as told by your doctor.  If you were prescribed an antibiotic medicine, take it as told by your doctor. Do not stop taking the antibiotic even if you start to feel better.  Avoid the following drinks:  Alcohol.  Caffeine.  Tea.  Carbonated drinks.  Drink enough fluid to keep your pee clear or pale yellow.  Keep all follow-up visits as told by your doctor. This is important.  Make sure to:  Empty your bladder often and completely. Do not to hold pee for long periods of time.  Empty your bladder before and after sex.  Wipe from front to back after a bowel movement if you are female. Use each tissue one time when you wipe. Contact a doctor if:  You have back pain.  You have a fever.  You feel sick to your stomach (nauseous).  You throw up (vomit).  Your symptoms do not get better after 3 days.  Your symptoms go away and then come back. Get help right away if:  You have very bad back pain.  You have very bad lower belly (abdominal) pain.  You are throwing up and cannot keep down any medicines or water. This information is not intended to replace advice given to you by your health care provider. Make sure you discuss any questions you have with your health care provider. Document Released: 02/25/2008 Document Revised: 02/14/2016 Document Reviewed: 07/30/2015  2017 Elsevier  

## 2016-11-05 NOTE — MAU Provider Note (Signed)
Chief Complaint:  Abdominal Pain; Generalized Body Aches; and Fever   None     HPI: Lisa Vazquez is a 31 y.o. [phone] at 4466w1d who presents to maternity admissions reporting new onset lower abdominal pain, productive cough , malaise, and increased work of breathing. This morning at 3 Am, the patient reports new onset 8/10 sharp abdominal pain which is worsen with movement & alleviate by lying down. The patient denies trial of any over-the-counter medications to help. She reports cough productive for green phlegm but denies hemoptysis or purulent phlegm.   She reports 1 week history of  cloudy urine & incomplete voiding. She denies dysuria or hematuria. She endorses a positive history of repeated pyelonephritis.     The patient endorses nausea report it as her baseline during pregnancy. She endorses subjective fever, chills, constipation, and malaise. she denies vomiting, diarrhea, soar throat, and muscle weakness. She did not receive a flu vaccine this season.        She denies contractions, leakage of fluid or vaginal bleeding. Good fetal movement.   Pregnancy Course:   Past Medical History: Past Medical History:  Diagnosis Date  . BV (bacterial vaginosis)   . Hypertension   . Kidney infection   . UTI (lower urinary tract infection)     Past obstetric history: OB History  Gravida Para Term Preterm AB Living  5 3 1 2 1 3   SAB TAB Ectopic Multiple Live Births    1 0 0 3    # Outcome Date GA Lbr Len/2nd Weight Sex Delivery Anes PTL Lv  5 Current           4 Term 09/19/09    M CS-LTranv Spinal  LIV  3 Preterm 04/26/04 6941w0d   M CS-LTranv Spinal  LIV  2 Preterm 06/30/03 5736w0d   F CS-LTranv Spinal  LIV  1 TAB               Past Surgical History: Past Surgical History:  Procedure Laterality Date  . CESAREAN SECTION     x3     Family History: Family History  Problem Relation Age of Onset  . Hypertension Mother   . Heart disease Father   . Asthma Brother      Social History: Social History  Substance Use Topics  . Smoking status: Former Smoker    Packs/day: 0.25    Years: 7.00    Types: Cigarettes  . Smokeless tobacco: Former NeurosurgeonUser     Comment: 3 cigs/day, has used vape pens  . Alcohol use No    Allergies: No Known Allergies  Meds:  Prescriptions Prior to Admission  Medication Sig Dispense Refill Last Dose  . Prenat w/o A Vit-FeFum-FePo-FA (CONCEPT OB) 130-92.4-1 MG CAPS Take 1 tablet by mouth daily. 30 capsule 12 11/04/2016 at Unknown time  . promethazine (PHENERGAN) 25 MG tablet Take 0.5-1 tablets (12.5-25 mg total) by mouth every 6 (six) hours as needed for nausea or vomiting. 30 tablet 5 Past Week at Unknown time  . aspirin EC 81 MG tablet Take 1 tablet (81 mg total) by mouth daily. Take after 12 weeks for prevention of preeclampssia later in pregnancy (Patient not taking: Reported on 10/16/2016) 300 tablet 2 Not Taking at Unknown time  . cyclobenzaprine (FLEXERIL) 10 MG tablet Take 1 tablet (10 mg total) by mouth 2 (two) times daily as needed for muscle spasms. (Patient not taking: Reported on 10/01/2016) 10 tablet 0 Not Taking at Unknown time  . nitrofurantoin,  macrocrystal-monohydrate, (MACROBID) 100 MG capsule Take 1 capsule (100 mg total) by mouth at bedtime. (Patient not taking: Reported on 11/05/2016) 30 capsule 0 Completed Course at Unknown time    I have reviewed patient's Past Medical Hx, Surgical Hx, Family Hx, Social Hx, medications and allergies.   ROS:  A comprehensive ROS was negative except per HPI.    Physical Exam  Patient Vitals for the past 24 hrs:  BP Temp Pulse Resp SpO2 Height Weight  11/05/16 1053 - - - - 99 % - -  11/05/16 1051 124/76 99.2 F (37.3 C) 101 18 - 5\' 4"  (1.626 m) 80.3 kg (177 lb)   Constitutional: Well-developed, fatigued female with surgical mask, lying in bed  Cardiovascular: normal rate, normal s1 & s2, no m/r/g Respiratory: rend expiratory rales in all lung fields bilaterally, extended  expiratory phase, increased work of breathing GI: Abd soft, lower abdominal tenderness with palpation, gravid appropriate for gestational age. Pos BS x 4 MS: Extremities nontender, no edema, normal ROM Neurologic: Alert and oriented x 3      Labs: Results for orders placed or performed during the hospital encounter of 11/05/16 (from the past 24 hour(s))  Urinalysis, Routine w reflex microscopic     Status: Abnormal   Collection Time: 11/05/16 10:30 AM  Result Value Ref Range   Color, Urine YELLOW YELLOW   APPearance CLOUDY (A) CLEAR   Specific Gravity, Urine 1.009 1.005 - 1.030   pH 5.0 5.0 - 8.0   Glucose, UA NEGATIVE NEGATIVE mg/dL   Hgb urine dipstick MODERATE (A) NEGATIVE   Bilirubin Urine NEGATIVE NEGATIVE   Ketones, ur 20 (A) NEGATIVE mg/dL   Protein, ur NEGATIVE NEGATIVE mg/dL   Nitrite NEGATIVE NEGATIVE   Leukocytes, UA LARGE (A) NEGATIVE   RBC / HPF 6-30 0 - 5 RBC/hpf   WBC, UA TOO NUMEROUS TO COUNT 0 - 5 WBC/hpf   Bacteria, UA RARE (A) NONE SEEN   Squamous Epithelial / LPF 0-5 (A) NONE SEEN   WBC Clumps PRESENT    Mucous PRESENT   Influenza panel by PCR (type A & B)     Status: None   Collection Time: 11/05/16 10:50 AM  Result Value Ref Range   Influenza A By PCR NEGATIVE NEGATIVE   Influenza B By PCR NEGATIVE NEGATIVE    Imaging:  [place] Mfm Ob Follow Up  Result Date: 10/31/2016 ----------------------------------------------------------------------  OBSTETRICS REPORT                      (Signed Final 10/31/2016 04:57 pm) ---------------------------------------------------------------------- Patient Info  ID #:       [id number]                         D.O.B.:   1986/01/29 (30 yrs)  Name:       Lisa Vazquez [facility]           Visit Date:  10/31/2016 04:26 pm ---------------------------------------------------------------------- Performed By  Performed By:     Georgeanna Lea       Ref. Address:     [address]  [place], [place]                                                             [address]  Attending:        Charlsie Merles MD         Location:         [facility]  Referred By:      Catalina Antigua MD ---------------------------------------------------------------------- Orders   #  Description                                 Code   1  [place] MFM OB FOLLOW UP                         [phone]  ----------------------------------------------------------------------   #  Ordered By               Order #        Accession #    Episode #   1  PEGGY CONSTANT           [id number]      [id number]     [id number]  ---------------------------------------------------------------------- Indications   [redacted] weeks gestation of pregnancy                Z3A.25   Encounter for antenatal screening,             Z36.9   unspecified   Substance abuse affecting pregnancy,           O99.320 F19.10   antepartum   Preterm labor without delivery, third trimesterO60.03   ( 28 weeks amd 34 weeks)   Hypertension - Chronic/Pre-existing            O10.019  ---------------------------------------------------------------------- OB History  Blood Type:            Height:  5'4"   Weight (lb):  164      BMI:   28.15  Gravidity:    5         Term:   1        Prem:   2        SAB:   1  TOP:          0       Ectopic:  0        Living: 3 ---------------------------------------------------------------------- Fetal Evaluation  Num Of Fetuses:     1  Fetal Heart         158  Rate(bpm):  Cardiac Activity:   Observed  Presentation:       Cephalic  Placenta:           Anterior, above cervical os  P. Cord Insertion:  Visualized  Amniotic Fluid  AFI FV:      Subjectively within normal limits                              Largest Pocket(cm)  7.0 ---------------------------------------------------------------------- Biometry  BPD:      60.3  mm     G. Age:  24w 4d          27  %    CI:        70.11   %   70 - 86                                                          FL/HC:      19.6   %   18.7 - 20.3  HC:      229.7  mm     G. Age:  25w 0d         29  %    HC/AC:      1.14       1.04 - 1.22  AC:      201.1  mm     G. Age:  24w 5d         33  %    FL/BPD:     74.6   %   71 - 87  FL:         45  mm     G. Age:  24w 6d         33  %    FL/AC:      22.4   %   20 - 24  Est. FW:     736  gm    1 lb 10 oz      49  % ---------------------------------------------------------------------- Gestational Age  U/S Today:     24w 6d                                        EDD:   02/14/17  Best:          Alcus Dad 0d    Det. By:   Previous Ultrasound      EDD:   02/13/17                                      (07/05/16) ---------------------------------------------------------------------- Anatomy  Cranium:               Appears normal         Aortic Arch:            Previously seen  Cavum:                 Previously seen        Ductal Arch:            Previously seen  Ventricles:            Previously seen        Diaphragm:              Appears normal  Choroid Plexus:        Previously seen        Stomach:                Appears normal, left  sided  Cerebellum:            Appears normal         Abdomen:                Previously seen  Posterior Fossa:       Previously seen        Abdominal Wall:         Previously seen  Nuchal Fold:           Previously seen        Cord Vessels:           Previously seen  Face:                  Appears normal         Kidneys:                Appear normal                         (orbits and profile)  Lips:                  Appears normal         Bladder:                Appears normal  Thoracic:              Appears normal         Spine:                  Appears normal  Heart:                 Previously seen        Upper Extremities:      Previously seen  RVOT:                  Previously seen        Lower  Extremities:      Previously seen  LVOT:                  Previously seen  Other:  Appears to be female Both heels and 5th digits preiously seen ---------------------------------------------------------------------- Cervix Uterus Adnexa  Cervix  Length:           3.71  cm.  Normal appearance by transabdominal scan. ---------------------------------------------------------------------- Impression  Singleton intrauterine pregnancy at 25+0 weeks  Review of the anatomy shows no sonographic markers for  aneuploidy or structural anomalies  Amniotic fluid volume is normal  Estimated fetal weight is 736g which is growth in the 49th  percentile ---------------------------------------------------------------------- Recommendations  All relevant anatomy has been seen. Recommend periodic  growth evaluations and antepartum testing after 32 weeks  due to her history of CHTN ----------------------------------------------------------------------                 Charlsie Merles, MD Electronically Signed Final Report   10/31/2016 04:57 pm ----------------------------------------------------------------------   MAU Course:   MDM: Plan of care reviewed with patient, including labs and tests ordered and medical treatment.   Assessment: 1. Cough   2. 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 Chronic hypertension during pregnancy, antepartum     Plan: Increased work of breathing:  CBC shows leukocytosis; CXR pending  Labor precautions and fetal kick counts     Collie Siad, Medical Student MS3 11/05/2016 11:50 AM

## 2016-11-07 LAB — CULTURE, OB URINE: Culture: >=100000 — AB

## 2016-11-08 ENCOUNTER — Inpatient Hospital Stay (HOSPITAL_COMMUNITY)
Admission: AD | Admit: 2016-11-08 | Discharge: 2016-11-11 | DRG: 781 | Disposition: A | Discharge status: Home / Self Care | Payer: Medicaid Other | Source: Ambulatory Visit | Attending: Obstetrics and Gynecology | Admitting: Obstetrics and Gynecology

## 2016-11-08 ENCOUNTER — Encounter (HOSPITAL_COMMUNITY): Payer: Self-pay | Admitting: Advanced Practice Midwife

## 2016-11-08 DIAGNOSIS — O2300 Infections of kidney in pregnancy, unspecified trimester: Secondary | ICD-10-CM

## 2016-11-08 DIAGNOSIS — B9620 Unspecified Escherichia coli [E. coli] as the cause of diseases classified elsewhere: Secondary | ICD-10-CM | POA: Diagnosis present

## 2016-11-08 DIAGNOSIS — O2303 Infections of kidney in pregnancy, third trimester: Secondary | ICD-10-CM | POA: Diagnosis present

## 2016-11-08 DIAGNOSIS — Z3A26 26 weeks gestation of pregnancy: Secondary | ICD-10-CM

## 2016-11-08 DIAGNOSIS — O10919 Unspecified pre-existing hypertension complicating pregnancy, unspecified trimester: Secondary | ICD-10-CM

## 2016-11-08 DIAGNOSIS — O99321 Drug use complicating pregnancy, first trimester: Secondary | ICD-10-CM

## 2016-11-08 DIAGNOSIS — Z87891 Personal history of nicotine dependence: Secondary | ICD-10-CM

## 2016-11-08 DIAGNOSIS — O2302 Infections of kidney in pregnancy, second trimester: Principal | ICD-10-CM | POA: Diagnosis present

## 2016-11-08 NOTE — MAU Note (Addendum)
Having pain since Weds lower abd, RUQ of abdomen that radiates to back. Unable to keep down anything since WEds. Was seen MAU Weds. No vag bleeding or d/c. No diarrhea. Being treated for uti. Hx pyelo and pt thinks has kidney infection

## 2016-11-09 ENCOUNTER — Observation Stay (HOSPITAL_COMMUNITY): Payer: Medicaid Other

## 2016-11-09 ENCOUNTER — Encounter (HOSPITAL_COMMUNITY): Payer: Self-pay | Admitting: Advanced Practice Midwife

## 2016-11-09 DIAGNOSIS — O2302 Infections of kidney in pregnancy, second trimester: Secondary | ICD-10-CM

## 2016-11-09 DIAGNOSIS — Z3A26 26 weeks gestation of pregnancy: Secondary | ICD-10-CM

## 2016-11-09 DIAGNOSIS — O2303 Infections of kidney in pregnancy, third trimester: Secondary | ICD-10-CM | POA: Diagnosis present

## 2016-11-09 LAB — URINALYSIS, ROUTINE W REFLEX MICROSCOPIC
Bilirubin Urine: NEGATIVE
CELLULAR CAST UA: 3
GLUCOSE, UA: NEGATIVE mg/dL
KETONES UR: NEGATIVE mg/dL
Nitrite: NEGATIVE
PROTEIN: 100 mg/dL — AB
Specific Gravity, Urine: 1.023 (ref 1.005–1.030)
pH: 5 (ref 5.0–8.0)

## 2016-11-09 LAB — CBC WITH DIFFERENTIAL/PLATELET
Basophils Absolute: 0 10*3/uL (ref 0.0–0.1)
Basophils Relative: 0 %
EOS ABS: 0.1 10*3/uL (ref 0.0–0.7)
Eosinophils Relative: 1 %
HCT: 31.3 % — ABNORMAL LOW (ref 36.0–46.0)
HEMOGLOBIN: 10.9 g/dL — AB (ref 12.0–15.0)
LYMPHS PCT: 19 %
Lymphs Abs: 2.2 10*3/uL (ref 0.7–4.0)
MCH: 25.8 pg — ABNORMAL LOW (ref 26.0–34.0)
MCHC: 34.8 g/dL (ref 30.0–36.0)
MCV: 74.2 fL — ABNORMAL LOW (ref 78.0–100.0)
Monocytes Absolute: 1.1 10*3/uL — ABNORMAL HIGH (ref 0.1–1.0)
Monocytes Relative: 9 %
NEUTROS ABS: 8.4 10*3/uL — AB (ref 1.7–7.7)
Neutrophils Relative %: 71 %
OTHER: 0 %
Platelets: 204 10*3/uL (ref 150–400)
RBC: 4.22 MIL/uL (ref 3.87–5.11)
RDW: 15.2 % (ref 11.5–15.5)
WBC: 11.8 10*3/uL — AB (ref 4.0–10.5)

## 2016-11-09 LAB — TYPE AND SCREEN
ABO/RH(D): A POS
ANTIBODY SCREEN: NEGATIVE

## 2016-11-09 MED ORDER — OXYCODONE-ACETAMINOPHEN 5-325 MG PO TABS
1.0000 | ORAL_TABLET | ORAL | Status: DC | PRN
  Administered 2016-11-09 – 2016-11-11 (×10): 2 via ORAL
  Filled 2016-11-09 (×10): qty 2

## 2016-11-09 MED ORDER — ASPIRIN EC 81 MG PO TBEC
81.0000 mg | DELAYED_RELEASE_TABLET | Freq: Every day | ORAL | Status: DC
  Administered 2016-11-09 – 2016-11-10 (×2): 81 mg via ORAL
  Filled 2016-11-09 (×3): qty 1

## 2016-11-09 MED ORDER — CALCIUM CARBONATE ANTACID 500 MG PO CHEW
2.0000 | CHEWABLE_TABLET | ORAL | Status: DC | PRN
  Administered 2016-11-09: 400 mg via ORAL
  Filled 2016-11-09: qty 2

## 2016-11-09 MED ORDER — DOCUSATE SODIUM 100 MG PO CAPS
100.0000 mg | ORAL_CAPSULE | Freq: Every day | ORAL | Status: DC
  Administered 2016-11-09 – 2016-11-10 (×2): 100 mg via ORAL
  Filled 2016-11-09 (×3): qty 1

## 2016-11-09 MED ORDER — ACETAMINOPHEN 325 MG PO TABS: 650.0000 mg | ORAL_TABLET | ORAL | Status: DC | PRN

## 2016-11-09 MED ORDER — PRENATAL MULTIVITAMIN CH
1.0000 | ORAL_TABLET | Freq: Every day | ORAL | Status: DC
  Administered 2016-11-09 – 2016-11-10 (×2): 1 via ORAL
  Filled 2016-11-09 (×3): qty 1

## 2016-11-09 MED ORDER — BUTALBITAL-APAP-CAFFEINE 50-325-40 MG PO TABS
1.0000 | ORAL_TABLET | ORAL | Status: DC | PRN
  Administered 2016-11-09: 1 via ORAL
  Filled 2016-11-09: qty 1

## 2016-11-09 MED ORDER — ZOLPIDEM TARTRATE 5 MG PO TABS
5.0000 mg | ORAL_TABLET | Freq: Every evening | ORAL | Status: DC | PRN
  Administered 2016-11-09: 5 mg via ORAL
  Filled 2016-11-09: qty 1

## 2016-11-09 MED ORDER — ONDANSETRON HCL 4 MG/2ML IJ SOLN: 4.0000 mg | Freq: Three times a day (TID) | INTRAMUSCULAR | Status: DC | PRN

## 2016-11-09 MED ORDER — ONDANSETRON HCL 4 MG PO TABS: 4.0000 mg | ORAL_TABLET | Freq: Three times a day (TID) | ORAL | Status: DC | PRN

## 2016-11-09 MED ORDER — DEXTROSE 5 % IV SOLN
2.0000 g | INTRAVENOUS | Status: DC
  Administered 2016-11-09 – 2016-11-11 (×3): 2 g via INTRAVENOUS
  Filled 2016-11-09 (×3): qty 2

## 2016-11-09 MED ORDER — INFLUENZA VAC SPLIT QUAD 0.5 ML IM SUSY: 0.5000 mL | PREFILLED_SYRINGE | INTRAMUSCULAR | Status: DC

## 2016-11-09 MED ORDER — SODIUM CHLORIDE 0.9 % IV BOLUS (SEPSIS)
1000.0000 mL | Freq: Once | INTRAVENOUS | Status: AC
  Administered 2016-11-09: 1000 mL via INTRAVENOUS

## 2016-11-09 NOTE — MAU Note (Cosign Needed)
Patient seen and evaluated in MAU and required admission to antenatal. Please see H&P note.   Marcy Sirenatherine Wallace, D.O. 11/09/2016, 1:54 AM PGY-2, [facility]  I was present for the exam and agree with above.  ShorewoodVirginia Mechille Varghese, CNM 11/09/2016 2:44 AM

## 2016-11-09 NOTE — Progress Notes (Signed)
efm applied. Baby very active. RN at bedside adjusting transducer to keep FHR for EFM strip.

## 2016-11-09 NOTE — Progress Notes (Signed)
CNM called and in delivery. Will see pt when finished in BS

## 2016-11-09 NOTE — Progress Notes (Signed)
To 317 via w/c

## 2016-11-09 NOTE — H&P (Signed)
FACULTY PRACTICE ANTEPARTUM ADMISSION HISTORY AND PHYSICAL NOTE   History of Present Illness: Lisa Vazquez is a 31 y.o. [phone] at [redacted]w[redacted]d with PMH significant for recurrent UTI/pyelonephritis who presented with worsening back pain, abdominal pain, nausea, and vomiting. Patient was started on daily Macrobid in Nov 2017 for UTI prophylaxis.   Patient presented to [facility] on 2/14 with similar symptoms as above and was found to have UA consistent with a UTI. She was started on Keflex 500 mg QID and patient reports compliance with this medication. Macrobid was discontinued at that time. Urine culture grew >100k colonies of E. Coli pan-sensitive.   Today, she returned with worsening of her symptoms. Reports this feels consistent with previous kidney infections. Denies fevers. Denies contractions, vaginal bleeding, pelvic pain, and vaginal discharge. Reports good fetal movement.   Patient Active Problem List   Diagnosis Date Noted  . Pyelonephritis affecting pregnancy 11/09/2016  . Marijuana use 07/26/2016  . 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 High blood hemoglobin F (HCC) 07/26/2016  . History of recurrent UTIs 07/24/2016  . Supervision of normal pregnancy, antepartum 07/17/2016  . Chronic hypertension during pregnancy, antepartum 07/17/2016  . CIN I (cervical intraepithelial neoplasia I) 04/06/2013  . Atypical squamous cell changes of undetermined significance (ASCUS) on cervical cytology with positive high risk human papilloma virus (HPV) 03/17/2013    Past Medical History:  Diagnosis Date  . BV (bacterial vaginosis)   . Hypertension   . Kidney infection   . UTI (lower urinary tract infection)     Past Surgical History:  Procedure Laterality Date  . CESAREAN SECTION     x3    OB History  Gravida Para Term Preterm AB Living  5 3 1 2 1 3   SAB TAB Ectopic Multiple Live Births    1 0 0 3    # Outcome Date GA Lbr Len/2nd Weight Sex  Delivery Anes PTL Lv  5 Current           4 Term 09/19/09    M CS-LTranv Spinal  LIV  3 Preterm 04/26/04 [redacted]w[redacted]d   M CS-LTranv Spinal  LIV  2 Preterm 06/30/03 [redacted]w[redacted]d   F CS-LTranv Spinal  LIV  1 TAB               Social History   Social History  . Marital status: Single    Spouse name: N/A  . Number of children: N/A  . Years of education: N/A   Social History Main Topics  . Smoking status: Former Smoker    Packs/day: 0.25    Years: 7.00    Types: Cigarettes  . Smokeless tobacco: Former Neurosurgeon     Comment: 3 cigs/day, has used vape pens  . Alcohol use No  . Drug use: No  . Sexual activity: Yes    Birth control/ protection: Condom   Other Topics Concern  . None   Social History Narrative  . None    Family History  Problem Relation Age of Onset  . Hypertension Mother   . Heart disease Father   . Asthma Brother     No Known Allergies  Prescriptions Prior to Admission  Medication Sig Dispense Refill Last Dose  . aspirin EC 81 MG tablet Take 1 tablet (81 mg total) by mouth daily. Take after 12 weeks for prevention of preeclampssia later in pregnancy 300 tablet 2 11/08/2016 at Unknown time  . cephALEXin (KEFLEX) 500 MG capsule Take 1 capsule (500  mg total) by mouth 4 (four) times daily. 40 capsule 0 11/08/2016 at 2200  . promethazine (PHENERGAN) 25 MG tablet Take 0.5-1 tablets (12.5-25 mg total) by mouth every 6 (six) hours as needed for nausea or vomiting. 30 tablet 5 11/08/2016 at Unknown time  . cyclobenzaprine (FLEXERIL) 10 MG tablet Take 1 tablet (10 mg total) by mouth 2 (two) times daily as needed for muscle spasms. (Patient not taking: Reported on 10/01/2016) 10 tablet 0 Not Taking at Unknown time  . nitrofurantoin, macrocrystal-monohydrate, (MACROBID) 100 MG capsule Take 1 capsule (100 mg total) by mouth at bedtime. (Patient not taking: Reported on 11/05/2016) 30 capsule 0 Completed Course at Unknown time  . Prenat w/o A Vit-FeFum-FePo-FA (CONCEPT OB) 130-92.4-1 MG CAPS  Take 1 tablet by mouth daily. 30 capsule 12 11/04/2016 at Unknown time    Review of Systems - Negative except abdominal pain, back pain, nausea, and vomiting.   Vitals:  BP 115/79 (BP Location: Right Arm)   Pulse 109   Temp 97.6 F (36.4 C)   Ht 5\' 4"  (1.626 m)   Wt 78.8 kg (173 lb 12.8 oz)   LMP 05/10/2016 (Approximate)   BMI 29.83 kg/m  Physical Examination: CONSTITUTIONAL: Well-developed, well-nourished female. Appears comfortable secondary to pain.  HENT:  Normocephalic, atraumatic. Oropharynx is clear and moist EYES: Conjunctivae and EOM are normal. Pupils are equal, round, and reactive to light.  CARDIOVASCULAR: Normal heart rate noted, regular rhythm RESPIRATORY: Effort and breath sounds normal, no problems with respiration noted ABDOMEN: Soft, diffusely TTP, gravid consistent with GA, no rebound or guarding  MUSCULOSKELETAL: CVA tenderness R >L, lumbar paraspinal tenderness bilaterally  SKIN: Skin is warm and dry. No rash noted. Not diaphoretic. No erythema. No pallor. NEUROLGIC: Alert and oriented to person, place, and time. Normal muscle tone coordination.  PSYCHIATRIC: Normal mood and affect. Normal behavior. Normal judgment and thought content.   Fetal Monitoring:Baseline: 125 bpm, Variability: Good {> 6 bpm), Accelerations: Reactive and Decelerations: Absent Tocometer: No contractions   Labs:  Results for orders placed or performed during the hospital encounter of 11/08/16 (from the past 24 hour(s))  Urinalysis, Routine w reflex microscopic   Collection Time: 11/09/16 12:01 AM  Result Value Ref Range   Color, Urine YELLOW YELLOW   APPearance HAZY (A) CLEAR   Specific Gravity, Urine 1.023 1.005 - 1.030   pH 5.0 5.0 - 8.0   Glucose, UA NEGATIVE NEGATIVE mg/dL   Hgb urine dipstick MODERATE (A) NEGATIVE   Bilirubin Urine NEGATIVE NEGATIVE   Ketones, ur NEGATIVE NEGATIVE mg/dL   Protein, ur 119 (A) NEGATIVE mg/dL   Nitrite NEGATIVE NEGATIVE   Leukocytes, UA  MODERATE (A) NEGATIVE   RBC / HPF TOO NUMEROUS TO COUNT 0 - 5 RBC/hpf   WBC, UA TOO NUMEROUS TO COUNT 0 - 5 WBC/hpf   Bacteria, UA RARE (A) NONE SEEN   Squamous Epithelial / LPF 0-5 (A) NONE SEEN   Mucous PRESENT    Hyaline Casts, UA PRESENT    Granular Casts, UA PRESENT    Cellular Cast, UA 3   CBC with Differential/Platelet   Collection Time: 11/09/16  1:31 AM  Result Value Ref Range   WBC 11.8 (H) 4.0 - 10.5 K/uL   RBC 4.22 3.87 - 5.11 MIL/uL   Hemoglobin 10.9 (L) 12.0 - 15.0 g/dL   HCT 14.7 (L) 82.9 - 56.2 %   MCV 74.2 (L) 78.0 - 100.0 fL   MCH 25.8 (L) 26.0 - 34.0 pg   MCHC  34.8 30.0 - 36.0 g/dL   RDW 69.6 29.5 - 28.4 %   Platelets 204 150 - 400 K/uL   Neutrophils Relative % 71 %   Lymphocytes Relative 19 %   Monocytes Relative 9 %   Eosinophils Relative 1 %   Basophils Relative 0 %   Other 0 %   Neutro Abs 8.4 (H) 1.7 - 7.7 K/uL   Lymphs Abs 2.2 0.7 - 4.0 K/uL   Monocytes Absolute 1.1 (H) 0.1 - 1.0 K/uL   Eosinophils Absolute 0.1 0.0 - 0.7 K/uL   Basophils Absolute 0.0 0.0 - 0.1 K/uL    Imaging Studies: Dg Chest 2 View  Result Date: 11/05/2016 CLINICAL DATA:  Shortness of breath, chest congestion, fever EXAM: CHEST  2 VIEW COMPARISON:  01/09/2016 FINDINGS: Mild cardiomegaly. Peribronchial thickening. No confluent opacities or effusions. No acute bony abnormality. IMPRESSION: Mild cardiomegaly.  Bronchitic changes. Electronically Signed   By: Charlett Nose M.D.   On: 11/05/2016 12:11   [place] [facility] Ob Follow Up  Result Date: 10/31/2016 ----------------------------------------------------------------------  OBSTETRICS REPORT                      (Signed Final 10/31/2016 04:57 pm) ---------------------------------------------------------------------- Patient Info  ID #:       [id number]                         D.O.B.:   1986/05/23 (30 yrs)  Name:       Lisa Vazquez [facility]           Visit Date:  10/31/2016 04:26 pm  ---------------------------------------------------------------------- Performed By  Performed By:     Georgeanna Lea       Ref. Address:     [address]                                                             [place], [place]                                                             [address]  Attending:        Charlsie Merles MD         Location:         [facility]  Referred By:      Catalina Antigua MD ---------------------------------------------------------------------- Orders   #  Description                                 Code   1  [place] [facility]FM OB FOLLOW  UP                         [id number]  ----------------------------------------------------------------------   #  Ordered By               Order #        Accession #    Episode #   1  PEGGY CONSTANT           [id number]      [id number]     [id number]  ---------------------------------------------------------------------- Indications   [redacted] weeks gestation of pregnancy                Z3A.25   Encounter for antenatal screening,             Z36.9   unspecified   Substance abuse affecting pregnancy,           O99.320 F19.10   antepartum   Preterm labor without delivery, third trimesterO60.03   ( 28 weeks amd 34 weeks)   Hypertension - Chronic/Pre-existing            O10.019  ---------------------------------------------------------------------- OB History  Blood Type:            Height:  5'4"   Weight (lb):  164      BMI:   28.15  Gravidity:    5         Term:   1        Prem:   2        SAB:   1  TOP:          0       Ectopic:  0        Living: 3 ---------------------------------------------------------------------- Fetal Evaluation  Num Of Fetuses:     1  Fetal Heart         158  Rate(bpm):  Cardiac Activity:   Observed  Presentation:       Cephalic  Placenta:           Anterior, above cervical os  P. Cord Insertion:  Visualized  Amniotic Fluid  AFI FV:      Subjectively within  normal limits                              Largest Pocket(cm)                              7.0 ---------------------------------------------------------------------- Biometry  BPD:      60.3  mm     G. Age:  24w 4d         27  %    CI:        70.11   %   70 - 86                                                          FL/HC:      19.6   %   18.7 - 20.3  HC:      229.7  mm     G. Age:  25w 0d         29  %    HC/AC:  1.14       1.04 - 1.22  AC:      201.1  mm     G. Age:  24w 5d         33  %    FL/BPD:     74.6   %   71 - 87  FL:         45  mm     G. Age:  24w 6d         33  %    FL/AC:      22.4   %   20 - 24  Est. FW:     736  gm    1 lb 10 oz      49  % ---------------------------------------------------------------------- Gestational Age  U/S Today:     24w 6d                                        EDD:   02/14/17  Best:          Alcus Dad 0d    Det. By:   Previous Ultrasound      EDD:   02/13/17                                      (07/05/16) ---------------------------------------------------------------------- Anatomy  Cranium:               Appears normal         Aortic Arch:            Previously seen  Cavum:                 Previously seen        Ductal Arch:            Previously seen  Ventricles:            Previously seen        Diaphragm:              Appears normal  Choroid Plexus:        Previously seen        Stomach:                Appears normal, left                                                                        sided  Cerebellum:            Appears normal         Abdomen:                Previously seen  Posterior Fossa:       Previously seen        Abdominal Wall:         Previously seen  Nuchal Fold:           Previously seen        Cord Vessels:           Previously seen  Face:  Appears normal         Kidneys:                Appear normal                         (orbits and profile)  Lips:                  Appears normal         Bladder:                Appears normal   Thoracic:              Appears normal         Spine:                  Appears normal  Heart:                 Previously seen        Upper Extremities:      Previously seen  RVOT:                  Previously seen        Lower Extremities:      Previously seen  LVOT:                  Previously seen  Other:  Appears to be female Both heels and 5th digits preiously seen ---------------------------------------------------------------------- Cervix Uterus Adnexa  Cervix  Length:           3.71  cm.  Normal appearance by transabdominal scan. ---------------------------------------------------------------------- Impression  Singleton intrauterine pregnancy at 25+0 weeks  Review of the anatomy shows no sonographic markers for  aneuploidy or structural anomalies  Amniotic fluid volume is normal  Estimated fetal weight is 736g which is growth in the 49th  percentile ---------------------------------------------------------------------- Recommendations  All relevant anatomy has been seen. Recommend periodic  growth evaluations and antepartum testing after 32 weeks  due to her history of CHTN ----------------------------------------------------------------------                 Charlsie Merles, MD Electronically Signed Final Report   10/31/2016 04:57 pm ----------------------------------------------------------------------    Assessment and Plan: Lisa Vazquez is 31 y.o. [phone] female at [redacted]w[redacted]d with PMH of recurrent UTI/pyelonephritis recently on Macrobid for UTI suppression and currently on Keflex for UTI who presented with worsening of symptoms concerning for pyelonephritis. UA still with signs of infectious process. Patient mildly tachycardic but vital signs otherwise unremarkable. Leukocytosis to 16 on 2/14.  -admit to antenatal with routine care  -Rocephin 2 mg q24h  -anti-emetics and pain control  -NS 1L bolus  -repeat CBC  -obtain renal ultrasound    Marcy Siren, D.O. 11/09/2016, 2:49 AM PGY-2, [facility]

## 2016-11-09 NOTE — Progress Notes (Signed)
efm d/ced 

## 2016-11-09 NOTE — Progress Notes (Signed)
Pt holding transducer for FHR

## 2016-11-10 DIAGNOSIS — O99322 Drug use complicating pregnancy, second trimester: Secondary | ICD-10-CM

## 2016-11-10 DIAGNOSIS — Z3A26 26 weeks gestation of pregnancy: Secondary | ICD-10-CM | POA: Diagnosis not present

## 2016-11-10 DIAGNOSIS — R109 Unspecified abdominal pain: Secondary | ICD-10-CM | POA: Diagnosis not present

## 2016-11-10 DIAGNOSIS — O2302 Infections of kidney in pregnancy, second trimester: Secondary | ICD-10-CM | POA: Diagnosis not present

## 2016-11-10 DIAGNOSIS — F1910 Other psychoactive substance abuse, uncomplicated: Secondary | ICD-10-CM

## 2016-11-10 DIAGNOSIS — Z87891 Personal history of nicotine dependence: Secondary | ICD-10-CM | POA: Diagnosis not present

## 2016-11-10 DIAGNOSIS — B9620 Unspecified Escherichia coli [E. coli] as the cause of diseases classified elsewhere: Secondary | ICD-10-CM | POA: Diagnosis present

## 2016-11-10 MED ORDER — AMOXICILLIN-POT CLAVULANATE 875-125 MG PO TABS: 1.0000 | ORAL_TABLET | Freq: Two times a day (BID) | ORAL | 1 refills | Status: AC

## 2016-11-10 MED ORDER — OXYCODONE-ACETAMINOPHEN 5-325 MG PO TABS: 1.0000 | ORAL_TABLET | ORAL | 0 refills | Status: DC | PRN

## 2016-11-10 NOTE — Progress Notes (Signed)
Pt instructed to inform RN when leaving unit.  Pt verbalized understanding & agreeable.

## 2016-11-10 NOTE — Progress Notes (Signed)
RN doing hourly rounding on pt, pt not in room or BR.  RN unaware of pt whereabouts secondary pt didn't inform RN she was leaving unit.

## 2016-11-10 NOTE — Progress Notes (Signed)
The patient notes increased pain right flank. RCVAT noted by me. Will cancel discharge and reexamine tomorrow.  Adam PhenixJames G Tradarius Reinwald, MD

## 2016-11-10 NOTE — Discharge Summary (Signed)
Antenatal Physician Discharge Summary  Patient ID: Lisa Vazquez MRN: [id number] DOB/AGE: [birth year] 33 y.o.  Admit date: 11/08/2016 Discharge date: 11/10/2016  Admission Diagnoses: pyelonephritis  Discharge Diagnoses: pyelonephritis  Prenatal Procedures: Renal US- right hydronephrosis.  o/w WNL  Significant Diagnostic Studies:  Results for orders placed or performed during the hospital encounter of 11/08/16 (from the past 168 hour(s))  Urinalysis, Routine w reflex microscopic   Collection Time: 11/09/16 12:01 AM  Result Value Ref Range   Color, Urine YELLOW YELLOW   APPearance HAZY (A) CLEAR   Specific Gravity, Urine 1.023 1.005 - 1.030   pH 5.0 5.0 - 8.0   Glucose, UA NEGATIVE NEGATIVE mg/dL   Hgb urine dipstick MODERATE (A) NEGATIVE   Bilirubin Urine NEGATIVE NEGATIVE   Ketones, ur NEGATIVE NEGATIVE mg/dL   Protein, ur 782 (A) NEGATIVE mg/dL   Nitrite NEGATIVE NEGATIVE   Leukocytes, UA MODERATE (A) NEGATIVE   RBC / HPF TOO NUMEROUS TO COUNT 0 - 5 RBC/hpf   WBC, UA TOO NUMEROUS TO COUNT 0 - 5 WBC/hpf   Bacteria, UA RARE (A) NONE SEEN   Squamous Epithelial / LPF 0-5 (A) NONE SEEN   Mucous PRESENT    Hyaline Casts, UA PRESENT    Granular Casts, UA PRESENT    Cellular Cast, UA 3   CBC with Differential/Platelet   Collection Time: 11/09/16  1:31 AM  Result Value Ref Range   WBC 11.8 (H) 4.0 - 10.5 K/uL   RBC 4.22 3.87 - 5.11 MIL/uL   Hemoglobin 10.9 (L) 12.0 - 15.0 g/dL   HCT 95.6 (L) 21.3 - 08.6 %   MCV 74.2 (L) 78.0 - 100.0 fL   MCH 25.8 (L) 26.0 - 34.0 pg   MCHC 34.8 30.0 - 36.0 g/dL   RDW 57.8 46.9 - 62.9 %   Platelets 204 150 - 400 K/uL   Neutrophils Relative % 71 %   Lymphocytes Relative 19 %   Monocytes Relative 9 %   Eosinophils Relative 1 %   Basophils Relative 0 %   Other 0 %   Neutro Abs 8.4 (H) 1.7 - 7.7 K/uL   Lymphs Abs 2.2 0.7 - 4.0 K/uL   Monocytes Absolute 1.1 (H) 0.1 - 1.0 K/uL   Eosinophils Absolute 0.1 0.0 - 0.7 K/uL   Basophils  Absolute 0.0 0.0 - 0.1 K/uL  Type and screen [facility]   Collection Time: 11/09/16  2:45 AM  Result Value Ref Range   ABO/RH(D) A POS    Antibody Screen NEG    Sample Expiration 11/12/2016   Results for orders placed or performed during the hospital encounter of 11/05/16 (from the past 168 hour(s))  Culture, OB Urine   Collection Time: 11/05/16 10:30 AM  Result Value Ref Range   Specimen Description URINE, CLEAN CATCH    Special Requests NONE    Culture (A)     >=100,000 COLONIES/mL ESCHERICHIA COLI NO GROUP B STREP (S.AGALACTIAE) ISOLATED Performed at [facility], [address] [place], [place] [address]    Report Status 11/07/2016 FINAL    Organism ID, Bacteria ESCHERICHIA COLI (A)       Susceptibility   Escherichia coli - MIC*    AMPICILLIN <=2 SENSITIVE Sensitive     CEFAZOLIN <=4 SENSITIVE Sensitive     CEFTRIAXONE <=1 SENSITIVE Sensitive     CIPROFLOXACIN <=0.25 SENSITIVE Sensitive     GENTAMICIN <=1 SENSITIVE Sensitive     IMIPENEM <=0.25 SENSITIVE Sensitive  NITROFURANTOIN <=16 SENSITIVE Sensitive     TRIMETH/SULFA <=20 SENSITIVE Sensitive     AMPICILLIN/SULBACTAM <=2 SENSITIVE Sensitive     PIP/TAZO <=4 SENSITIVE Sensitive     Extended ESBL NEGATIVE Sensitive     * >=100,000 COLONIES/mL ESCHERICHIA COLI  Urinalysis, Routine w reflex microscopic   Collection Time: 11/05/16 10:30 AM  Result Value Ref Range   Color, Urine YELLOW YELLOW   APPearance CLOUDY (A) CLEAR   Specific Gravity, Urine 1.009 1.005 - 1.030   pH 5.0 5.0 - 8.0   Glucose, UA NEGATIVE NEGATIVE mg/dL   Hgb urine dipstick MODERATE (A) NEGATIVE   Bilirubin Urine NEGATIVE NEGATIVE   Ketones, ur 20 (A) NEGATIVE mg/dL   Protein, ur NEGATIVE NEGATIVE mg/dL   Nitrite NEGATIVE NEGATIVE   Leukocytes, UA LARGE (A) NEGATIVE   RBC / HPF 6-30 0 - 5 RBC/hpf   WBC, UA TOO NUMEROUS TO COUNT 0 - 5 WBC/hpf   Bacteria, UA RARE (A) NONE SEEN   Squamous Epithelial / LPF 0-5 (A) NONE  SEEN   WBC Clumps PRESENT    Mucous PRESENT   Influenza panel by PCR (type A & B)   Collection Time: 11/05/16 10:50 AM  Result Value Ref Range   Influenza A By PCR NEGATIVE NEGATIVE   Influenza B By PCR NEGATIVE NEGATIVE  CBC   Collection Time: 11/05/16 11:47 AM  Result Value Ref Range   WBC 16.4 (H) 4.0 - 10.5 K/uL   RBC 4.12 3.87 - 5.11 MIL/uL   Hemoglobin 10.6 (L) 12.0 - 15.0 g/dL   HCT 16.131.3 (L) 09.636.0 - 04.546.0 %   MCV 76.0 (L) 78.0 - 100.0 fL   MCH 25.7 (L) 26.0 - 34.0 pg   MCHC 33.9 30.0 - 36.0 g/dL   RDW 40.915.2 81.111.5 - 91.415.5 %   Platelets 202 150 - 400 K/uL    Treatments: IV hydration and antibiotics: ceftriaxone  Hospital Course:  This is a 31 y.o. [id number] with IUP at 5671w6d admitted for pyelonephritis.  Pt has a h/o recurrent UTIs despite being on suppression, she reported increasing pain was was admitted 2/18 for pyelonephritis. She was started onhin IV.  Today I suggested that pt be discharged in the am. Pt reported that 'I was hoping ot go home today'.  Pt reports that her pain has been controlled by po pain meds.  She reports very minimal pain. She denies N/V/F/C.  She has a f/u appt on Thur in 3 days with her OB and reports that she will be able to keep that appt. Her WBC is down and she is afebrile. Pt deemed stable for discharge.  She received her Ceftriaxone for today. She was deemed stable for discharge to home with outpatient follow up.  Discharge Exam: BP 116/76 (BP Location: Right Arm)   Pulse 91   Temp 98.2 F (36.8 C) (Oral)   Resp 18   Ht 5\' 4"  (1.626 m)   Wt 173 lb 12.8 oz (78.8 kg)   LMP 05/10/2016 (Approximate)   SpO2 99%   BMI 29.83 kg/m  General appearance: alert Back: mild tenderness to mod firm palpation on right side  GI: soft, non-tender; bowel sounds normal; no masses,  no organomegaly and gravid Extremities: extremities normal, atraumatic, no cyanosis or edema  Discharge Condition: good  Disposition: 01-Home or Self Care  Discharge  Instructions    Discharge activity:  No Restrictions    Complete by:  As directed    Discharge diet:  No restrictions  Complete by:  As directed    No sexual activity restrictions    Complete by:  As directed    Notify physician for a general feeling that "something is not right"    Complete by:  As directed    Notify physician for increase or change in vaginal discharge    Complete by:  As directed    Notify physician for intestinal cramps, with or without diarrhea, sometimes described as "gas pain"    Complete by:  As directed    Notify physician for leaking of fluid    Complete by:  As directed    Notify physician for low, dull backache, unrelieved by heat or Tylenol    Complete by:  As directed    Notify physician for menstrual like cramps    Complete by:  As directed    Notify physician for pelvic pressure    Complete by:  As directed    Notify physician for uterine contractions.  These may be painless and feel like the uterus is tightening or the baby is  "balling up"    Complete by:  As directed    Notify physician for vaginal bleeding    Complete by:  As directed    PRETERM LABOR:  Includes any of the follwing symptoms that occur between 20 - [redacted] weeks gestation.  If these symptoms are not stopped, preterm labor can result in preterm delivery, placing your baby at risk    Complete by:  As directed      Allergies as of 11/10/2016   No Known Allergies     Medication List    STOP taking these medications   acetaminophen 325 MG tablet Commonly known as:  TYLENOL   cephALEXin 500 MG capsule Commonly known as:  KEFLEX     TAKE these medications   amoxicillin-clavulanate 875-125 MG tablet Commonly known as:  AUGMENTIN Take 1 tablet by mouth 2 (two) times daily.   aspirin EC 81 MG tablet Take 1 tablet (81 mg total) by mouth daily. Take after 12 weeks for prevention of preeclampssia later in pregnancy   CONCEPT OB 130-92.4-1 MG Caps Take 1 tablet by mouth daily.    oxyCODONE-acetaminophen 5-325 MG tablet Commonly known as:  PERCOCET/ROXICET Take 1-2 tablets by mouth every 4 (four) hours as needed for severe pain.   promethazine 25 MG tablet Commonly known as:  PHENERGAN Take 0.5-1 tablets (12.5-25 mg total) by mouth every 6 (six) hours as needed for nausea or vomiting.      Follow-up Information    [facility] Follow up in 3 day(s).   Why:  Pt aleady has appointmetn scheduled Contact information: [address] [place] [address] [phone]          Signed: Willodean Rosenthal M.D. 11/10/2016, 7:47 AM

## 2016-11-11 DIAGNOSIS — O2302 Infections of kidney in pregnancy, second trimester: Principal | ICD-10-CM

## 2016-11-11 NOTE — Discharge Instructions (Signed)
Pregnancy and Urinary Tract Infection °WHAT IS A URINARY TRACT INFECTION? °A urinary tract infection (UTI) is an infection of any part of the urinary tract. This includes the kidneys, the tubes that connect your kidneys to your bladder (ureters), the bladder, and the tube that carries urine out of your body (urethra). These organs make, store, and get rid of urine in the body. A UTI can be a bladder infection (cystitis) or a kidney infection (pyelonephritis). This infection may be caused by fungi, viruses, and bacteria. Bacteria are the most common cause of UTIs. °You are more likely to develop a UTI during pregnancy because: °· The physical and hormonal changes your body goes through can make it easier for bacteria to get into your urinary tract. °· Your growing baby puts pressure on your uterus and can affect urine flow. °DOES A UTI PLACE MY BABY AT RISK? °An untreated UTI during pregnancy could lead to a kidney infection, which can cause health problems that could affect your baby. Possible complications of an untreated UTI include: °· Having your baby before 37 weeks of pregnancy (premature). °· Having a baby with a low birth weight. °· Developing high blood pressure during pregnancy (preeclampsia). °WHAT ARE THE SYMPTOMS OF A UTI? °Symptoms of a UTI include: °· Fever. °· Frequent urination or passing small amounts of urine frequently. °· Needing to urinate urgently. °· Pain or a burning sensation with urination. °· Urine that smells bad or unusual. °· Cloudy urine. °· Pain in the lower abdomen or back. °· Trouble urinating. °· Blood in the urine. °· Vomiting or being less hungry than normal. °· Diarrhea or abdominal pain. °· Vaginal discharge. °WHAT ARE THE TREATMENT OPTIONS FOR A UTI DURING PREGNANCY? °Treatment for this condition may include: °· Antibiotic medicines that are safe to take during pregnancy. °· Other medicines to treat less common causes of UTI. °HOW CAN I PREVENT A UTI? °To prevent a UTI: °· Go  to the bathroom as soon as you feel the need. °· Always wipe from front to back. °· Wash your genital area with soap and warm water daily. °· Empty your bladder before and after sex. °· Wear cotton underwear. °· Limit your intake of high sugar foods or drinks, such as regular soda, juice, and sweets.. °· Drink 6-8 glasses of water daily. °· Do not wear tight-fitting pants. °· Do not douche or use deodorant sprays. °· Do not drink alcohol, caffeine, or carbonated drinks. These can irritate the bladder. °WHEN SHOULD I SEEK MEDICAL CARE? °Seek medical care if: °· Your symptoms do not improve or get worse. °· You have a fever after two days of treatment. °· You have a rash. °· You have abnormal vaginal discharge. °· You have back or side pain. °· You have chills. °· You have nausea and vomiting. °WHEN SHOULD I SEEK IMMEDIATE MEDICAL CARE? °Seek immediate medical care if you are pregnant and: °· You feel contractions in your uterus. °· You have lower belly pain. °· You have a gush of fluid from your vagina. °· You have blood in your urine. °· You are vomiting and cannot keep down any medicines or water. °This information is not intended to replace advice given to you by your health care provider. Make sure you discuss any questions you have with your health care provider. °Document Released: 01/03/2011 Document Revised: 02/11/2016 Document Reviewed: 07/30/2015 °Elsevier Interactive Patient Education © 2017 [employer] Inc[employer] ° °

## 2016-11-11 NOTE — Discharge Summary (Signed)
Physician Discharge Summary  Patient ID: Lisa Vazquez MRN: [id number] DOB/AGE: 24-Oct-1985 31 y.o.  Admit date: 11/08/2016 Discharge date: 11/11/2016  Admission Diagnoses:pyelonephritis  Discharge Diagnoses:  Active Problems:   Pyelonephritis affecting pregnancy in second trimester   Discharged Condition: fair  Hospital Course:  This is a 31 y.o. [id number] with IUP at [redacted]w[redacted]d admitted for pyelonephritis.  Pt has a h/o recurrent UTIs despite being on suppression, she reported increasing pain was was admitted 2/18 for pyelonephritis. She was started onhin IV.  Today I suggested that pt be discharged in the am. Pt reported that 'I was hoping ot go home today'.  Pt reports that her pain has been controlled by po pain meds.  She reports very minimal pain. She denies N/V/F/C.  She has a f/u appt on Thur in 3 days with her OB and reports that she will be able to keep that appt. Her WBC is down and she is afebrile. Pt deemed stable for discharge.  She received her Ceftriaxone for today. She was deemed stable for discharge to home with outpatient follow up.  Consults: None  Significant Diagnostic Studies: microbiology: urine culture: positive for e. coli  Treatments: IV hydration and antibiotics: ceftriaxone  Discharge Exam: Blood pressure 111/69, pulse 84, temperature 97.9 F (36.6 C), temperature source Oral, resp. rate 18, height 5\' 4"  (1.626 m), weight 78.8 kg (173 lb 12.8 oz), last menstrual period 05/10/2016, SpO2 100 %. General appearance: alert, cooperative and no distress Resp: normal effort GI: gravid Extremities: extremities normal, atraumatic, no cyanosis or edema Mild right CVAT Disposition: 01-Home or Self Care  Discharge Instructions    Discharge activity:  No Restrictions    Complete by:  As directed    Discharge diet:  No restrictions    Complete by:  As directed    No sexual activity restrictions    Complete by:  As directed    Notify physician for a general  feeling that "something is not right"    Complete by:  As directed    Notify physician for increase or change in vaginal discharge    Complete by:  As directed    Notify physician for intestinal cramps, with or without diarrhea, sometimes described as "gas pain"    Complete by:  As directed    Notify physician for leaking of fluid    Complete by:  As directed    Notify physician for low, dull backache, unrelieved by heat or Tylenol    Complete by:  As directed    Notify physician for menstrual like cramps    Complete by:  As directed    Notify physician for pelvic pressure    Complete by:  As directed    Notify physician for uterine contractions.  These may be painless and feel like the uterus is tightening or the baby is  "balling up"    Complete by:  As directed    Notify physician for vaginal bleeding    Complete by:  As directed    PRETERM LABOR:  Includes any of the follwing symptoms that occur between 20 - [redacted] weeks gestation.  If these symptoms are not stopped, preterm labor can result in preterm delivery, placing your baby at risk    Complete by:  As directed      Allergies as of 11/11/2016   No Known Allergies     Medication List    STOP taking these medications   acetaminophen 325 MG tablet Commonly known as:  TYLENOL  cephALEXin 500 MG capsule Commonly known as:  KEFLEX     TAKE these medications   amoxicillin-clavulanate 875-125 MG tablet Commonly known as:  AUGMENTIN Take 1 tablet by mouth 2 (two) times daily.   aspirin EC 81 MG tablet Take 1 tablet (81 mg total) by mouth daily. Take after 12 weeks for prevention of preeclampssia later in pregnancy   CONCEPT OB 130-92.4-1 MG Caps Take 1 tablet by mouth daily.   oxyCODONE-acetaminophen 5-325 MG tablet Commonly known as:  PERCOCET/ROXICET Take 1-2 tablets by mouth every 4 (four) hours as needed for severe pain.   promethazine 25 MG tablet Commonly known as:  PHENERGAN Take 0.5-1 tablets (12.5-25 mg  total) by mouth every 6 (six) hours as needed for nausea or vomiting.      [facility]-up Information    [facility] day(s).   Why:  Pt aleady has appointmetn scheduled Contact information: [address] [place] [phone]          Signed: Scheryl DarterJames Arnold 11/11/2016, 7:11 AM

## 2016-11-13 ENCOUNTER — Other Ambulatory Visit: Payer: Medicaid Other

## 2016-11-13 ENCOUNTER — Ambulatory Visit (INDEPENDENT_AMBULATORY_CARE_PROVIDER_SITE_OTHER): Payer: Medicaid Other | Admitting: Obstetrics & Gynecology

## 2016-11-13 VITALS — BP 122/78 | HR 96 | Wt 176.7 lb

## 2016-11-13 DIAGNOSIS — O0992 Supervision of high risk pregnancy, unspecified, second trimester: Secondary | ICD-10-CM

## 2016-11-13 DIAGNOSIS — O2302 Infections of kidney in pregnancy, second trimester: Secondary | ICD-10-CM

## 2016-11-13 DIAGNOSIS — O10919 Unspecified pre-existing hypertension complicating pregnancy, unspecified trimester: Secondary | ICD-10-CM

## 2016-11-13 DIAGNOSIS — O10912 Unspecified pre-existing hypertension complicating pregnancy, second trimester: Secondary | ICD-10-CM

## 2016-11-13 MED ORDER — OXYCODONE-ACETAMINOPHEN 5-325 MG PO TABS: 1.0000 | ORAL_TABLET | Freq: Four times a day (QID) | ORAL | 0 refills | Status: DC | PRN

## 2016-11-13 NOTE — Patient Instructions (Signed)
Return to clinic for any scheduled appointments or obstetric concerns, or go to MAU for evaluation  

## 2016-11-13 NOTE — Progress Notes (Signed)
   PRENATAL VISIT NOTE  Subjective:  Lisa Vazquez is a 31 y.o. [phone] at 7122w2d being seen today for ongoing prenatal care.  She is currently monitored for the following issues for this high-risk pregnancy and has Atypical squamous cell changes of undetermined significance (ASCUS) on cervical cytology with positive high risk human papilloma virus (HPV); CIN I (cervical intraepithelial neoplasia I); Supervision of high-risk pregnancy; Chronic hypertension during pregnancy, antepartum; History of recurrent UTIs; Marijuana use; Substance abuse affecting pregnancy in first trimester, antepartum; High blood hemoglobin F (HCC); and Pyelonephritis affecting pregnancy in second trimester on her problem list.  Patient reports severe backache due to recent pyelonephritis. Alleviated by Percocet, recently ran out. Was given 15 tablets after recent discharge from hospital.  Contractions: Not present. Vag. Bleeding: None.  Movement: Present. Denies leaking of fluid.   The following portions of the patient's history were reviewed and updated as appropriate: allergies, current medications, past family history, past medical history, past social history, past surgical history and problem list. Problem list updated.  Objective:   Vitals:   11/13/16 0919  BP: 122/78  Pulse: 96  Weight: 176 lb 11.2 oz (80.2 kg)    Fetal Status: Fetal Heart Rate (bpm): 138 Fundal Height: 27 cm Movement: Present     General:  Alert, oriented and cooperative. Patient is in no acute distress.  Skin: Skin is warm and dry. No rash noted.   Cardiovascular: Normal heart rate noted  Respiratory: Normal respiratory effort, no problems with respiration noted  Abdomen: Soft, gravid, appropriate for gestational age. Pain/Pressure: Absent     Pelvic:  Cervical exam deferred        Extremities: Normal range of motion.  Edema: None  Mental Status: Normal mood and affect. Normal behavior. Normal judgment and thought content.    Assessment and Plan:  Pregnancy: [id number] at 7922w2d  1. Pyelonephritis affecting pregnancy in second trimester Patient informed this is last narcotic prescription for now. Extra strength Tylenol recommended. - oxyCODONE-acetaminophen (PERCOCET/ROXICET) 5-325 MG tablet; Take 1-2 tablets by mouth every 6 (six) hours as needed for severe pain.  Dispense: 30 tablet; Refill: 0  2. Chronic hypertension during pregnancy, antepartum Stable BP, up to date on scans. Antenatal testing to start 32 weeks.  3. Supervision of high risk pregnancy in second trimester Declined Tdap. Third trimester labs today. - Glucose Tolerance, 2 Hours w/1 Hour - CBC - HIV antibody (with reflex) - RPR Considering BTS, papers to be signed next visit (no Medicaid card in system or with patient today). Preterm labor symptoms and general obstetric precautions including but not limited to vaginal bleeding, contractions, leaking of fluid and fetal movement were reviewed in detail with the patient. Please refer to After Visit Summary for other counseling recommendations.  Return in about 3 weeks (around 12/04/2016) for OB Visit.   Tereso NewcomerUgonna A Meleny Tregoning, MD

## 2016-11-14 ENCOUNTER — Encounter: Payer: Self-pay | Admitting: Obstetrics & Gynecology

## 2016-11-14 DIAGNOSIS — O24419 Gestational diabetes mellitus in pregnancy, unspecified control: Secondary | ICD-10-CM | POA: Insufficient documentation

## 2016-11-14 LAB — CBC
Hematocrit: 30.8 % — ABNORMAL LOW (ref 34.0–46.6)
Hemoglobin: 9.9 g/dL — ABNORMAL LOW (ref 11.1–15.9)
MCH: 25.4 pg — AB (ref 26.6–33.0)
MCHC: 32.1 g/dL (ref 31.5–35.7)
MCV: 79 fL (ref 79–97)
Platelets: 337 10*3/uL (ref 150–379)
RBC: 3.9 x10E6/uL (ref 3.77–5.28)
RDW: 15.7 % — ABNORMAL HIGH (ref 12.3–15.4)
WBC: 8.5 10*3/uL (ref 3.4–10.8)

## 2016-11-14 LAB — GLUCOSE TOLERANCE, 2 HOURS W/ 1HR
GLUCOSE, 1 HOUR: 97 mg/dL (ref 65–179)
GLUCOSE, 2 HOUR: 65 mg/dL (ref 65–152)
GLUCOSE, FASTING: 100 mg/dL — AB (ref 65–91)

## 2016-11-14 LAB — HIV ANTIBODY (ROUTINE TESTING W REFLEX): HIV SCREEN 4TH GENERATION: NONREACTIVE

## 2016-11-14 LAB — RPR: RPR: NONREACTIVE

## 2016-11-26 ENCOUNTER — Other Ambulatory Visit: Payer: Self-pay | Admitting: *Deleted

## 2016-11-26 DIAGNOSIS — O24419 Gestational diabetes mellitus in pregnancy, unspecified control: Secondary | ICD-10-CM

## 2016-11-26 MED ORDER — ACCU-CHEK FASTCLIX LANCETS MISC: 1.0000 | Freq: Four times a day (QID) | 5 refills | Status: DC

## 2016-11-26 MED ORDER — GLUCOSE BLOOD VI STRP: ORAL_STRIP | 5 refills | Status: DC

## 2016-11-26 MED ORDER — ACCU-CHEK GUIDE W/DEVICE KIT: 1.0000 | PACK | Freq: Once | 0 refills | Status: AC

## 2016-11-26 NOTE — Progress Notes (Signed)
Diabetic orders placed per Dr Macon LargeAnyanwu. See lab note.

## 2016-12-04 ENCOUNTER — Encounter: Payer: Self-pay | Admitting: *Deleted

## 2016-12-04 ENCOUNTER — Ambulatory Visit (INDEPENDENT_AMBULATORY_CARE_PROVIDER_SITE_OTHER): Payer: Medicaid Other | Admitting: Obstetrics and Gynecology

## 2016-12-04 VITALS — BP 131/78 | HR 90 | Wt 174.4 lb

## 2016-12-04 DIAGNOSIS — D582 Other hemoglobinopathies: Secondary | ICD-10-CM

## 2016-12-04 DIAGNOSIS — O10919 Unspecified pre-existing hypertension complicating pregnancy, unspecified trimester: Secondary | ICD-10-CM

## 2016-12-04 DIAGNOSIS — O10913 Unspecified pre-existing hypertension complicating pregnancy, third trimester: Secondary | ICD-10-CM | POA: Diagnosis not present

## 2016-12-04 DIAGNOSIS — O24410 Gestational diabetes mellitus in pregnancy, diet controlled: Secondary | ICD-10-CM

## 2016-12-04 DIAGNOSIS — O0993 Supervision of high risk pregnancy, unspecified, third trimester: Secondary | ICD-10-CM

## 2016-12-04 DIAGNOSIS — O2302 Infections of kidney in pregnancy, second trimester: Secondary | ICD-10-CM

## 2016-12-04 DIAGNOSIS — O2303 Infections of kidney in pregnancy, third trimester: Secondary | ICD-10-CM | POA: Diagnosis not present

## 2016-12-04 NOTE — Addendum Note (Signed)
Addended by: Hermina StaggersERVIN, Yakira Duquette L on: 12/04/2016 11:36 AM   Modules accepted: Orders

## 2016-12-04 NOTE — Progress Notes (Signed)
Subjective:  Lisa Vazquez is a 31 y.o. [phone] at 396w2d being seen today for ongoing prenatal care.  She is currently monitored for the following issues for this high-risk pregnancy and has Atypical squamous cell changes of undetermined significance (ASCUS) on cervical cytology with positive high risk human papilloma virus (HPV); CIN I (cervical intraepithelial neoplasia I); Supervision of high-risk pregnancy; Chronic hypertension during pregnancy, antepartum; History of recurrent UTIs; Marijuana use; Substance abuse affecting pregnancy in first trimester, antepartum; High blood hemoglobin F (HCC); Pyelonephritis affecting pregnancy in second trimester; and Gestational diabetes mellitus, antepartum on her problem list.  Patient reports no complaints.  Contractions: Irregular. Vag. Bleeding: None.  Movement: Present. Denies leaking of fluid.   The following portions of the patient's history were reviewed and updated as appropriate: allergies, current medications, past family history, past medical history, past social history, past surgical history and problem list. Problem list updated.  Objective:   Vitals:   12/04/16 0855  BP: 131/78  Pulse: 90  Weight: 174 lb 6.4 oz (79.1 kg)    Fetal Status: Fetal Heart Rate (bpm): 142   Movement: Present     General:  Alert, oriented and cooperative. Patient is in no acute distress.  Skin: Skin is warm and dry. No rash noted.   Cardiovascular: Normal heart rate noted  Respiratory: Normal respiratory effort, no problems with respiration noted  Abdomen: Soft, gravid, appropriate for gestational age. Pain/Pressure: Present     Pelvic:  Cervical exam deferred        Extremities: Normal range of motion.  Edema: Trace  Mental Status: Normal mood and affect. Normal behavior. Normal judgment and thought content.   Urinalysis:      Assessment and Plan:  Pregnancy: [id number] at 366w2d  1. Chronic hypertension during pregnancy, antepartum BP stable  without meds Continue with PNV - US MFM OB FOLLOW UP; Future Will start twice weekly testing with next OBV 2. Diet controlled gestational diabetes mellitus (GDM), antepartum Forgot to bring BS readings today. Stressed importance of bringing readings to appts She reports BS have been in goal range  3. Pyelonephritis affecting pregnancy in second trimester Had to reschedule urology appt  4. High blood hemoglobin F (HCC)   5. Supervision of high risk pregnancy in third trimester BTL papers signed today  Preterm labor symptoms and general obstetric precautions including but not limited to vaginal bleeding, contractions, leaking of fluid and fetal movement were reviewed in detail with the patient. Please refer to After Visit Summary for other counseling recommendations.  No Follow-up on file.   Hermina StaggersMichael L Amilah Greenspan, MD

## 2016-12-08 ENCOUNTER — Encounter (HOSPITAL_COMMUNITY): Payer: Self-pay | Admitting: *Deleted

## 2016-12-08 ENCOUNTER — Inpatient Hospital Stay (HOSPITAL_COMMUNITY)
Admission: AD | Admit: 2016-12-08 | Discharge: 2016-12-08 | Disposition: A | Discharge status: Home / Self Care | Payer: Medicaid Other | Source: Ambulatory Visit | Attending: Obstetrics and Gynecology | Admitting: Obstetrics and Gynecology

## 2016-12-08 DIAGNOSIS — Z7982 Long term (current) use of aspirin: Secondary | ICD-10-CM | POA: Insufficient documentation

## 2016-12-08 DIAGNOSIS — O26893 Other specified pregnancy related conditions, third trimester: Secondary | ICD-10-CM | POA: Insufficient documentation

## 2016-12-08 DIAGNOSIS — M549 Dorsalgia, unspecified: Secondary | ICD-10-CM

## 2016-12-08 DIAGNOSIS — O368130 Decreased fetal movements, third trimester, not applicable or unspecified: Secondary | ICD-10-CM

## 2016-12-08 DIAGNOSIS — R1030 Lower abdominal pain, unspecified: Secondary | ICD-10-CM | POA: Diagnosis present

## 2016-12-08 DIAGNOSIS — M545 Low back pain: Secondary | ICD-10-CM | POA: Insufficient documentation

## 2016-12-08 DIAGNOSIS — O99321 Drug use complicating pregnancy, first trimester: Secondary | ICD-10-CM

## 2016-12-08 DIAGNOSIS — O163 Unspecified maternal hypertension, third trimester: Secondary | ICD-10-CM | POA: Insufficient documentation

## 2016-12-08 DIAGNOSIS — R3989 Other symptoms and signs involving the genitourinary system: Secondary | ICD-10-CM

## 2016-12-08 DIAGNOSIS — O9989 Other specified diseases and conditions complicating pregnancy, childbirth and the puerperium: Secondary | ICD-10-CM | POA: Diagnosis not present

## 2016-12-08 DIAGNOSIS — Z79899 Other long term (current) drug therapy: Secondary | ICD-10-CM | POA: Diagnosis not present

## 2016-12-08 DIAGNOSIS — N390 Urinary tract infection, site not specified: Secondary | ICD-10-CM | POA: Diagnosis not present

## 2016-12-08 DIAGNOSIS — Z87891 Personal history of nicotine dependence: Secondary | ICD-10-CM | POA: Insufficient documentation

## 2016-12-08 DIAGNOSIS — Z3A30 30 weeks gestation of pregnancy: Secondary | ICD-10-CM | POA: Insufficient documentation

## 2016-12-08 DIAGNOSIS — Z3689 Encounter for other specified antenatal screening: Secondary | ICD-10-CM

## 2016-12-08 LAB — COMPREHENSIVE METABOLIC PANEL
ALBUMIN: 3.2 g/dL — AB (ref 3.5–5.0)
ALK PHOS: 77 U/L (ref 38–126)
ALT: 8 U/L — ABNORMAL LOW (ref 14–54)
ANION GAP: 3 — AB (ref 5–15)
AST: 14 U/L — ABNORMAL LOW (ref 15–41)
BUN: 9 mg/dL (ref 6–20)
CHLORIDE: 107 mmol/L (ref 101–111)
CO2: 26 mmol/L (ref 22–32)
Calcium: 8.7 mg/dL — ABNORMAL LOW (ref 8.9–10.3)
Creatinine, Ser: 0.52 mg/dL (ref 0.44–1.00)
GFR calc Af Amer: >60 mL/min (ref >60)
GFR calc non Af Amer: >60 mL/min (ref >60)
GLUCOSE: 82 mg/dL (ref 65–99)
POTASSIUM: 4 mmol/L (ref 3.5–5.1)
SODIUM: 136 mmol/L (ref 135–145)
Total Bilirubin: 0.3 mg/dL (ref 0.3–1.2)
Total Protein: 7.2 g/dL (ref 6.5–8.1)

## 2016-12-08 LAB — CBC WITH DIFFERENTIAL/PLATELET
BASOS PCT: 0 %
Basophils Absolute: 0 10*3/uL (ref 0.0–0.1)
EOS ABS: 0.1 10*3/uL (ref 0.0–0.7)
Eosinophils Relative: 1 %
HCT: 33.3 % — ABNORMAL LOW (ref 36.0–46.0)
HEMOGLOBIN: 10.9 g/dL — AB (ref 12.0–15.0)
Lymphocytes Relative: 33 %
Lymphs Abs: 3 10*3/uL (ref 0.7–4.0)
MCH: 25.6 pg — ABNORMAL LOW (ref 26.0–34.0)
MCHC: 32.7 g/dL (ref 30.0–36.0)
MCV: 78.2 fL (ref 78.0–100.0)
MONOS PCT: 4 %
Monocytes Absolute: 0.4 10*3/uL (ref 0.1–1.0)
NEUTROS PCT: 62 %
Neutro Abs: 5.8 10*3/uL (ref 1.7–7.7)
PLATELETS: 234 10*3/uL (ref 150–400)
RBC: 4.26 MIL/uL (ref 3.87–5.11)
RDW: 16.6 % — AB (ref 11.5–15.5)
WBC: 9.3 10*3/uL (ref 4.0–10.5)

## 2016-12-08 LAB — URINALYSIS, ROUTINE W REFLEX MICROSCOPIC
BILIRUBIN URINE: NEGATIVE
Glucose, UA: NEGATIVE mg/dL
KETONES UR: NEGATIVE mg/dL
Leukocytes, UA: NEGATIVE
NITRITE: NEGATIVE
PH: 7.5 (ref 5.0–8.0)
Protein, ur: NEGATIVE mg/dL
Specific Gravity, Urine: 1.01 (ref 1.005–1.030)

## 2016-12-08 LAB — URINALYSIS, MICROSCOPIC (REFLEX): WBC, UA: NONE SEEN WBC/hpf (ref 0–5)

## 2016-12-08 MED ORDER — NITROFURANTOIN MONOHYD MACRO 100 MG PO CAPS: 100.0000 mg | ORAL_CAPSULE | Freq: Once | ORAL | 0 refills | Status: AC

## 2016-12-08 NOTE — MAU Provider Note (Signed)
S: Asencion IslamDominique M Schnoor is a 31 year old [id number] female at 9257w6d with h/o recurrent UTIs and pyelonephritis who presents with 2 days of right back and lower abdominal pain.  Pain reminds her of her recent pyelonephritis bout on 11/09/16.  States that after discharge on 2/19, she took all but 2 pills from her course of amoxicillin.  Denies nausea, vomiting, fever, chills, SOB, chest pain, headaches, dizziness, edema, dysuria, hematuria.  ROS is positive for sharp suprapubic pain and flank pain, difficulty maintaining urine stream.  Feels good fetal movement this morning, no contractions, no vaginal bleeding.  Currently taking Macrobid for UTI prevention.  Boy/breast/BTL.  O: Vitals - BP 120/84  HR 90  RR 18  T 97.8  SpO2 100%  Physical Exam  Constitutional: She is oriented to person, place, and time. She appears well-developed and well-nourished.  HENT:  Head: Normocephalic and atraumatic.  Cardiovascular: Normal rate, regular rhythm and normal heart sounds.  Exam reveals no gallop and no friction rub.   No murmur heard. Pulmonary/Chest: Breath sounds normal. No respiratory distress. She has no wheezes. She has no rales.  Abdominal: Soft. Bowel sounds are normal. There is tenderness in the suprapubic area. There is CVA tenderness (right-sided).  Musculoskeletal: She exhibits no edema.  Neurological: She is alert and oriented to person, place, and time.  Skin: Skin is warm and dry.   Labs: U/A, CBC, CMP (results pending)  A: Asencion IslamDominique M Lamison is a 31 year old [phone] at 4157w6d with history of recurrent UTI/pyelonephritis (most recent bout 2/18) who presents with 2 days of right flank pain and sharp suprapubic pain and difficulty maintaining urine stream.  P: 1. Flank pain, suprapubic pain: obtain U/A (results pending), check CBC, CMP.  Admit for treatment if determined to be pyelo.  Consult urology re: recurrent pyelo.     Meridee Scoreeborah Aala Ransom, MS3

## 2016-12-08 NOTE — MAU Provider Note (Signed)
History     CSN: [id number]  Arrival date and time: 12/08/16 5400   First Provider Initiated Contact with Patient 12/08/16 [phone]      Chief Complaint  Patient presents with  . Abdominal Pain  . Back Pain  . Decreased Fetal Movement   HPI   [name] is 31 y.o. female [phone] @ 60w6dhere in MAU with complaints of abdominal pain and lower back pain. She complains of right sided lower back pain and abdominal pain that started 2 days ago. The pain in both areas is constant. She has taken tylenol for the pain. Her last dose was last night before bed, which did not help. She does have a history of chronic pain with substance abuse history of her problem list.   She was started on macrobid suppression, however was told to stop taking it. Denies vaginal bleeding. + fetal movement since arrival.   She has an appointment with urology coming up. Has had 6 UTI's this year. Had a renal UKorealast month  OB History    Gravida Para Term Preterm AB Living   '5 3 1 2 1 3   '$ SAB TAB Ectopic Multiple Live Births     1 0 0 3      Past Medical History:  Diagnosis Date  . BV (bacterial vaginosis)   . Hypertension   . Kidney infection   . UTI (lower urinary tract infection)     Past Surgical History:  Procedure Laterality Date  . CESAREAN SECTION     x3    Family History  Problem Relation Age of Onset  . Hypertension Mother   . Heart disease Father   . Asthma Brother     Social History  Substance Use Topics  . Smoking status: Former Smoker    Packs/day: 0.25    Years: 7.00    Types: Cigarettes  . 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 Alcohol use No    Allergies: No Known Allergies  Prescriptions Prior to Admission  Medication Sig Dispense Refill Last Dose  . acetaminophen (TYLENOL) 500 MG tablet Take 1,000 mg by mouth every 6 (six) hours as needed for mild pain or headache.   12/07/2016 at Unknown time  . aspirin EC 81 MG  tablet Take 1 tablet (81 mg total) by mouth daily. Take after 12 weeks for prevention of preeclampssia later in pregnancy 300 tablet 2 12/07/2016 at Unknown time  . oxyCODONE-acetaminophen (PERCOCET/ROXICET) 5-325 MG tablet Take 1-2 tablets by mouth every 6 (six) hours as needed for severe pain. 30 tablet 0 12/07/2016 at Unknown time  . Prenat w/o A Vit-FeFum-FePo-FA (CONCEPT OB) 130-92.4-1 MG CAPS Take 1 tablet by mouth daily. 30 capsule 12 12/08/2016 at Unknown time  . ACCU-CHEK FASTCLIX LANCETS MISC 1 each by Percutaneous route 4 (four) times daily. 100 each 5   . glucose blood (ACCU-CHEK GUIDE) test strip Use as instructed 100 each 5   . promethazine (PHENERGAN) 25 MG tablet Take 0.5-1 tablets (12.5-25 mg total) by mouth every 6 (six) hours as needed for nausea or vomiting. 30 tablet 5 11/24/2016   Results for orders placed or performed during the hospital encounter of 12/08/16 (from the past 48 hour(s))  Urinalysis, Routine w reflex microscopic     Status: Abnormal   Collection Time: 12/08/16  8:35 AM  Result Value Ref Range   Color, Urine YELLOW YELLOW   APPearance CLEAR CLEAR  Specific Gravity, Urine 1.010 1.005 - 1.030   pH 7.5 5.0 - 8.0   Glucose, UA NEGATIVE NEGATIVE mg/dL   Hgb urine dipstick TRACE (A) NEGATIVE   Bilirubin Urine NEGATIVE NEGATIVE   Ketones, ur NEGATIVE NEGATIVE mg/dL   Protein, ur NEGATIVE NEGATIVE mg/dL   Nitrite NEGATIVE NEGATIVE   Leukocytes, UA NEGATIVE NEGATIVE  Urinalysis, Microscopic (reflex)     Status: Abnormal   Collection Time: 12/08/16  8:35 AM  Result Value Ref Range   RBC / HPF 0-5 0 - 5 RBC/hpf   WBC, UA NONE SEEN 0 - 5 WBC/hpf   Bacteria, UA RARE (A) NONE SEEN   Squamous Epithelial / LPF 0-5 (A) NONE SEEN  CBC with Differential     Status: Abnormal   Collection Time: 12/08/16  9:44 AM  Result Value Ref Range   WBC 9.3 4.0 - 10.5 K/uL   RBC 4.26 3.87 - 5.11 MIL/uL   Hemoglobin 10.9 (L) 12.0 - 15.0 g/dL   HCT 33.3 (L) 36.0 - 46.0 %   MCV 78.2  78.0 - 100.0 fL   MCH 25.6 (L) 26.0 - 34.0 pg   MCHC 32.7 30.0 - 36.0 g/dL   RDW 16.6 (H) 11.5 - 15.5 %   Platelets 234 150 - 400 K/uL   Neutrophils Relative % 62 %   Neutro Abs 5.8 1.7 - 7.7 K/uL   Lymphocytes Relative 33 %   Lymphs Abs 3.0 0.7 - 4.0 K/uL   Monocytes Relative 4 %   Monocytes Absolute 0.4 0.1 - 1.0 K/uL   Eosinophils Relative 1 %   Eosinophils Absolute 0.1 0.0 - 0.7 K/uL   Basophils Relative 0 %   Basophils Absolute 0.0 0.0 - 0.1 K/uL  Comprehensive metabolic panel     Status: Abnormal   Collection Time: 12/08/16  9:44 AM  Result Value Ref Range   Sodium 136 135 - 145 mmol/L   Potassium 4.0 3.5 - 5.1 mmol/L   Chloride 107 101 - 111 mmol/L   CO2 26 22 - 32 mmol/L   Glucose, Bld 82 65 - 99 mg/dL   BUN 9 6 - 20 mg/dL   Creatinine, Ser 0.52 0.44 - 1.00 mg/dL   Calcium 8.7 (L) 8.9 - 10.3 mg/dL   Total Protein 7.2 6.5 - 8.1 g/dL   Albumin 3.2 (L) 3.5 - 5.0 g/dL   AST 14 (L) 15 - 41 U/L   ALT 8 (L) 14 - 54 U/L   Alkaline Phosphatase 77 38 - 126 U/L   Total Bilirubin 0.3 0.3 - 1.2 mg/dL   GFR calc non Af Amer >60 >60 mL/min   GFR calc Af Amer >60 >60 mL/min    Comment: (NOTE) The eGFR has been calculated using the CKD EPI equation. This calculation has not been validated in all clinical situations. eGFR's persistently <60 mL/min signify possible Chronic Kidney Disease.    Anion gap 3 (L) 5 - 15   Review of Systems  Gastrointestinal: Negative for nausea and vomiting.  Genitourinary: Positive for flank pain. Negative for dysuria and urgency.   Physical Exam   Blood pressure 120/84, pulse 90, temperature 97.8 F (36.6 C), temperature source Oral, resp. rate 18, weight 172 lb 12 oz (78.4 kg), last menstrual period 05/10/2016, SpO2 100 %.  Physical Exam  Constitutional: She is oriented to person, place, and time. She appears well-developed and well-nourished. No distress.  HENT:  Head: Normocephalic.  Eyes: Pupils are equal, round, and reactive to light.  GI: Soft. Normal appearance. There is tenderness in the right lower quadrant, suprapubic area and left lower quadrant. There is CVA tenderness (Right sided ). There is no rigidity, no rebound and no guarding.  Genitourinary:  Genitourinary Comments: Dilation: Closed Effacement (%): Thick Cervical Position: Posterior Exam by:: Noni Saupe, NP  Musculoskeletal: Normal range of motion.  Neurological: She is alert and oriented to person, place, and time.  Skin: Skin is warm. She is not diaphoretic.  Psychiatric: Her behavior is normal.   Fetal Tracing: Baseline: 135 bpm  Variability: Moderate  Accelerations: 15x15 Decelerations: None Toco: None  MAU Course  Procedures  None  MDM  Urine culture pending  UA without signs of active infection.  Discussed patient with Dr. Rip Harbour @ 1020.  Assessment and Plan   A:  1. Suspected UTI   2. 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 Back pain in pregnancy   4. Decreased fetal movements in third trimester, single or unspecified fetus   5. NST (non-stress test) reactive     P:  Discharge home in stable condition Rx: macrobid suppression  Return to MAU if symptoms worsen Urine culture pending.  Ok to use tylenol for pain.    Lezlie Lye, NP 12/08/2016 7:33 PM

## 2016-12-08 NOTE — Discharge Instructions (Signed)

## 2016-12-08 NOTE — MAU Note (Signed)
abd pain  And back pain started a couple days ago, getting worse.  Baby was kicking up a storm yesterday morning, then no movement until on the way here  Denies hx of PTL or PTD

## 2016-12-09 LAB — CULTURE, OB URINE: Special Requests: NORMAL

## 2016-12-15 ENCOUNTER — Encounter: Payer: Medicaid Other | Admitting: Obstetrics and Gynecology

## 2016-12-18 ENCOUNTER — Ambulatory Visit (HOSPITAL_COMMUNITY)
Admission: RE | Admit: 2016-12-18 | Discharge: 2016-12-18 | Disposition: A | Discharge status: Home / Self Care | Payer: Medicaid Other | Source: Ambulatory Visit | Attending: Obstetrics and Gynecology | Admitting: Obstetrics and Gynecology

## 2016-12-18 ENCOUNTER — Encounter (HOSPITAL_COMMUNITY): Payer: Self-pay

## 2016-12-18 DIAGNOSIS — Z362 Encounter for other antenatal screening follow-up: Secondary | ICD-10-CM | POA: Diagnosis present

## 2016-12-18 DIAGNOSIS — Z3A31 31 weeks gestation of pregnancy: Secondary | ICD-10-CM | POA: Insufficient documentation

## 2016-12-18 DIAGNOSIS — O10013 Pre-existing essential hypertension complicating pregnancy, third trimester: Secondary | ICD-10-CM | POA: Diagnosis not present

## 2016-12-18 DIAGNOSIS — O24410 Gestational diabetes mellitus in pregnancy, diet controlled: Secondary | ICD-10-CM

## 2016-12-18 DIAGNOSIS — O99323 Drug use complicating pregnancy, third trimester: Secondary | ICD-10-CM | POA: Diagnosis not present

## 2016-12-18 DIAGNOSIS — O10919 Unspecified pre-existing hypertension complicating pregnancy, unspecified trimester: Secondary | ICD-10-CM

## 2016-12-18 HISTORY — DX: Gestational diabetes mellitus in pregnancy, unspecified control: O24.419

## 2016-12-18 NOTE — Addendum Note (Signed)
Encounter addended by: Genevie CheshireHeather M Sharada Albornoz, RT on: 12/18/2016 12:14 PM<BR>    Actions taken: Imaging Exam ended

## 2016-12-22 ENCOUNTER — Ambulatory Visit (INDEPENDENT_AMBULATORY_CARE_PROVIDER_SITE_OTHER): Payer: Medicaid Other | Admitting: Obstetrics & Gynecology

## 2016-12-22 ENCOUNTER — Encounter (HOSPITAL_COMMUNITY): Payer: Self-pay

## 2016-12-22 ENCOUNTER — Other Ambulatory Visit: Payer: Self-pay | Admitting: Obstetrics & Gynecology

## 2016-12-22 VITALS — BP 122/82 | HR 88 | Temp 98.4°F | Wt 175.0 lb

## 2016-12-22 DIAGNOSIS — O10913 Unspecified pre-existing hypertension complicating pregnancy, third trimester: Secondary | ICD-10-CM

## 2016-12-22 DIAGNOSIS — F1123 Opioid dependence with withdrawal: Secondary | ICD-10-CM | POA: Diagnosis not present

## 2016-12-22 DIAGNOSIS — O2303 Infections of kidney in pregnancy, third trimester: Secondary | ICD-10-CM | POA: Diagnosis not present

## 2016-12-22 DIAGNOSIS — O24419 Gestational diabetes mellitus in pregnancy, unspecified control: Secondary | ICD-10-CM

## 2016-12-22 DIAGNOSIS — O0993 Supervision of high risk pregnancy, unspecified, third trimester: Secondary | ICD-10-CM

## 2016-12-22 DIAGNOSIS — F1193 Opioid use, unspecified with withdrawal: Secondary | ICD-10-CM

## 2016-12-22 DIAGNOSIS — O10919 Unspecified pre-existing hypertension complicating pregnancy, unspecified trimester: Secondary | ICD-10-CM

## 2016-12-22 MED ORDER — PROMETHAZINE HCL 25 MG PO TABS: 25.0000 mg | ORAL_TABLET | Freq: Four times a day (QID) | ORAL | 2 refills | Status: DC | PRN

## 2016-12-22 MED ORDER — LOPERAMIDE HCL 2 MG PO TABS: 2.0000 mg | ORAL_TABLET | Freq: Four times a day (QID) | ORAL | 2 refills | Status: DC | PRN

## 2016-12-22 MED ORDER — HYDROXYZINE HCL 25 MG PO TABS: 25.0000 mg | ORAL_TABLET | Freq: Four times a day (QID) | ORAL | 2 refills | Status: DC | PRN

## 2016-12-22 NOTE — Progress Notes (Signed)
PRENATAL VISIT NOTE  Subjective:  Lisa Vazquez is a 31 y.o. [phone] at [redacted]w[redacted]d being seen today for ongoing prenatal care.  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  Patient reports rare contractions. She is more concerned about feeling "awful".  Has shakes, chills, nausea, restlessness, diarrhea since last dose of Percocet last week; was taking this due to pain 2/2 pyelonephritis.  Has taken Percocet before but never had these symptoms. Contractions: Irregular. Vag. Bleeding: None.  Movement: Present. Denies leaking of fluid.   The following portions of the patient's history were reviewed and updated as appropriate: allergies, current medications, past family history, past medical history, past social history, past surgical history and problem list. Problem list updated.  Objective:   Vitals:   12/22/16 1008  BP: 122/82  Pulse: 88  Temp: 98.4 F (36.9 C)  Weight: 175 lb (79.4 kg)    Fetal Status: Fetal Heart Rate (bpm): NST Fundal Height: 32 cm Movement: Present     General:  Alert, oriented and cooperative. Patient is in no acute distress.  Skin: Skin is warm and dry. No rash noted.   Cardiovascular: Normal heart rate noted  Respiratory: Normal respiratory effort, no problems with respiration noted  Abdomen: Soft, gravid, appropriate for gestational age. Pain/Pressure: Present     Pelvic:  Cervical exam  deferred        Extremities: Normal range of motion.  Edema: Trace  Mental Status: Normal mood and affect. Normal behavior. Normal judgment and thought content.   NST performed today was reviewed and was found to be reactive.  Continue recommended antenatal testing and prenatal care.   Assessment and Plan:  Pregnancy: [id number] at [redacted]w[redacted]d  1. Opioid withdrawal (HCC) Patient's symptoms are consistent with opioid withdrawal. Offered referral to Methadone clinic, she declined. Feels the symptoms are improving day by day. No need for acute intervention now (no need for Clonidine etc) but prescribed other medications that could alleviate other symptoms. She was told to call/come back for worsening symptoms.  Stable vitals for patient, reactive NST for fetus today.  - promethazine (PHENERGAN) 25 MG tablet; Take 1 tablet (25 mg total) by mouth every 6 (six) hours as needed for nausea or vomiting.  Dispense: 30 tablet; Refill: 2 - loperamide (IMODIUM A-D) 2 MG tablet; Take 1 tablet (2 mg total) by mouth 4 (four) times daily as needed for diarrhea or loose stools.  Dispense: 30 tablet; Refill: 2 - hydrOXYzine (ATARAX/VISTARIL) 25 MG tablet; Take 1-2 tablets (25-50 mg total) by mouth every 6 (six) hours as needed for anxiety, nausea or vomiting (insomnia).  Dispense: 30 tablet; Refill: 2  2. Chronic hypertension during pregnancy, antepartum Reactive NST. Stable BP.   Continue recommended antenatal testing and prenatal care. Weekly BPP ordered at MFM.  3. Gestational diabetes mellitus (GDM), antepartum, gestational diabetes method of control unspecified Patient did not bring log but reported normal BS in 80-90s for fastings, and in 120s for postprandials.  Emphasized importance of bring log to visits to ensure appropriate glycemic control and also to reduce risks  of GDM associated maternal/fetal morbidity and mortality.   4. Pyelonephritis affecting pregnancy in third trimester Continue Macrobid  suppression.  5. Supervision of high risk pregnancy in third trimester Surgical order sent in today to schedule RCS and BTS at 39 weeks.  Preterm labor symptoms and general obstetric precautions including but not limited to vaginal bleeding, contractions, leaking of fluid and fetal movement were reviewed in detail with the patient. Please refer to After Visit Summary for other counseling recommendations.  Return in about 1 week (around 12/29/2016) for OB Visit, NST.   Tereso Newcomer, MD

## 2016-12-22 NOTE — Patient Instructions (Signed)
Return to clinic for any scheduled appointments or obstetric concerns, or go to MAU for evaluation  

## 2016-12-24 ENCOUNTER — Other Ambulatory Visit: Payer: Self-pay | Admitting: Obstetrics & Gynecology

## 2016-12-24 ENCOUNTER — Encounter (HOSPITAL_COMMUNITY): Payer: Self-pay

## 2016-12-24 ENCOUNTER — Ambulatory Visit (HOSPITAL_COMMUNITY)
Admission: RE | Admit: 2016-12-24 | Discharge: 2016-12-24 | Disposition: A | Discharge status: Home / Self Care | Payer: Medicaid Other | Source: Ambulatory Visit | Attending: Obstetrics & Gynecology | Admitting: Obstetrics & Gynecology

## 2016-12-24 DIAGNOSIS — Z8751 Personal history of pre-term labor: Secondary | ICD-10-CM

## 2016-12-24 DIAGNOSIS — Z3A33 33 weeks gestation of pregnancy: Secondary | ICD-10-CM

## 2016-12-24 DIAGNOSIS — O0993 Supervision of high risk pregnancy, unspecified, third trimester: Secondary | ICD-10-CM

## 2016-12-24 DIAGNOSIS — O34219 Maternal care for unspecified type scar from previous cesarean delivery: Secondary | ICD-10-CM | POA: Diagnosis not present

## 2016-12-24 DIAGNOSIS — O10013 Pre-existing essential hypertension complicating pregnancy, third trimester: Secondary | ICD-10-CM | POA: Insufficient documentation

## 2016-12-24 DIAGNOSIS — O24410 Gestational diabetes mellitus in pregnancy, diet controlled: Secondary | ICD-10-CM | POA: Diagnosis not present

## 2016-12-24 DIAGNOSIS — O09219 Supervision of pregnancy with history of pre-term labor, unspecified trimester: Secondary | ICD-10-CM | POA: Insufficient documentation

## 2016-12-24 DIAGNOSIS — O10919 Unspecified pre-existing hypertension complicating pregnancy, unspecified trimester: Secondary | ICD-10-CM

## 2016-12-24 DIAGNOSIS — O24419 Gestational diabetes mellitus in pregnancy, unspecified control: Secondary | ICD-10-CM

## 2016-12-29 ENCOUNTER — Ambulatory Visit (INDEPENDENT_AMBULATORY_CARE_PROVIDER_SITE_OTHER): Payer: Medicaid Other | Admitting: Obstetrics and Gynecology

## 2016-12-29 ENCOUNTER — Ambulatory Visit: Payer: Medicaid Other

## 2016-12-29 ENCOUNTER — Ambulatory Visit (HOSPITAL_COMMUNITY)
Admission: RE | Admit: 2016-12-29 | Discharge: 2016-12-29 | Disposition: A | Discharge status: Home / Self Care | Payer: Medicaid Other | Source: Ambulatory Visit | Attending: Obstetrics and Gynecology | Admitting: Obstetrics and Gynecology

## 2016-12-29 VITALS — BP 123/85 | HR 106 | Wt 182.0 lb

## 2016-12-29 DIAGNOSIS — Z3A33 33 weeks gestation of pregnancy: Secondary | ICD-10-CM | POA: Insufficient documentation

## 2016-12-29 DIAGNOSIS — R87610 Atypical squamous cells of undetermined significance on cytologic smear of cervix (ASC-US): Secondary | ICD-10-CM | POA: Diagnosis not present

## 2016-12-29 DIAGNOSIS — O10919 Unspecified pre-existing hypertension complicating pregnancy, unspecified trimester: Secondary | ICD-10-CM | POA: Diagnosis not present

## 2016-12-29 DIAGNOSIS — O24410 Gestational diabetes mellitus in pregnancy, diet controlled: Secondary | ICD-10-CM

## 2016-12-29 DIAGNOSIS — R87810 Cervical high risk human papillomavirus (HPV) DNA test positive: Secondary | ICD-10-CM

## 2016-12-29 DIAGNOSIS — O0993 Supervision of high risk pregnancy, unspecified, third trimester: Secondary | ICD-10-CM

## 2016-12-29 DIAGNOSIS — O34219 Maternal care for unspecified type scar from previous cesarean delivery: Secondary | ICD-10-CM | POA: Diagnosis not present

## 2016-12-29 DIAGNOSIS — O09219 Supervision of pregnancy with history of pre-term labor, unspecified trimester: Secondary | ICD-10-CM | POA: Diagnosis not present

## 2016-12-29 DIAGNOSIS — O2303 Infections of kidney in pregnancy, third trimester: Secondary | ICD-10-CM

## 2016-12-29 DIAGNOSIS — O10013 Pre-existing essential hypertension complicating pregnancy, third trimester: Secondary | ICD-10-CM | POA: Insufficient documentation

## 2016-12-29 NOTE — Progress Notes (Signed)
   PRENATAL VISIT NOTE  Subjective:  Lisa Vazquez is a 31 y.o. [phone] at [redacted]w[redacted]d being seen today for ongoing prenatal care.  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  Patient reports no complaints.  Contractions: Irregular. Vag. Bleeding: None.  Movement: Present. Denies leaking of fluid.   The following portions of the patient's history were reviewed and updated as appropriate: allergies, current medications, past family history, past medical history, past social history, past surgical history and problem list. Problem list updated.  Objective:   Vitals:   12/29/16 0939  BP: 123/85  Pulse: (!) 106  Weight: 182 lb (82.6 kg)    Fetal Status: Fetal Heart Rate (bpm): NST Fundal Height: 34 cm Movement: Present     General:  Alert, oriented and cooperative. Patient is in no acute distress.  Skin: Skin is warm and dry. No rash noted.   Cardiovascular: Normal heart rate noted  Respiratory: Normal respiratory effort, no problems with respiration noted  Abdomen: Soft, gravid, appropriate for gestational age. Pain/Pressure: Present     Pelvic:  Cervical exam deferred        Extremities: Normal range of motion.  Edema: Trace  Mental Status: Normal mood and affect. Normal behavior. Normal judgment and thought content.   Assessment and Plan:  Pregnancy: [id number] at [redacted]w[redacted]d  1. Supervision of high risk pregnancy in  third trimester Patient is doing well without complaints Normal growth on 3/29- no further growth recommended Scheduled for repeat cesarean section with BTL on 5/16  2. Pyelonephritis affecting pregnancy in third trimester Continue daily suppression  3. Diet controlled gestational diabetes mellitus (GDM), antepartum Patient did not bring CBG log but reports fasting in the 80's and pp in 120's Advised patient to bring CBG log at next visit  4. Atypical squamous cell changes of undetermined significance (ASCUS) on cervical cytology with positive high risk human papilloma virus (HPV) colpo pp  5. Chronic hypertension during pregnancy, antepartum BP well controlled 3/29 EFW 1421 gm 63%tile. No further growth  Recommended per Dr Ezzard Standing Will order follow up at 38 weeks NST reviewed and non reactive- baseline 140, mod variability 10 x 10 accels, no decels- patient sent for BPP due to non reactive NST   Preterm labor symptoms and general obstetric precautions including but not limited to vaginal bleeding, contractions, leaking of fluid and fetal movement were reviewed in detail with the patient. Please refer to After Visit Summary for other counseling recommendations.  Return in about 1 week (around 01/05/2017) for ROB, NST.   Catalina Antigua, MD

## 2016-12-29 NOTE — Progress Notes (Signed)
Patient reports good fetal  movement, and contractions that come and go. 

## 2017-01-02 ENCOUNTER — Ambulatory Visit (HOSPITAL_COMMUNITY): Admission: RE | Admit: 2017-01-02 | Payer: Medicaid Other | Source: Ambulatory Visit

## 2017-01-05 ENCOUNTER — Encounter: Payer: Medicaid Other | Admitting: Obstetrics and Gynecology

## 2017-01-06 ENCOUNTER — Encounter (HOSPITAL_COMMUNITY): Payer: Self-pay

## 2017-01-06 ENCOUNTER — Inpatient Hospital Stay (HOSPITAL_COMMUNITY)
Admission: AD | Admit: 2017-01-06 | Discharge: 2017-01-06 | Disposition: A | Discharge status: Home / Self Care | Payer: Medicaid Other | Source: Ambulatory Visit | Attending: Family Medicine | Admitting: Family Medicine

## 2017-01-06 ENCOUNTER — Inpatient Hospital Stay (HOSPITAL_COMMUNITY): Payer: Medicaid Other

## 2017-01-06 DIAGNOSIS — O99613 Diseases of the digestive system complicating pregnancy, third trimester: Secondary | ICD-10-CM | POA: Diagnosis not present

## 2017-01-06 DIAGNOSIS — Z79899 Other long term (current) drug therapy: Secondary | ICD-10-CM | POA: Insufficient documentation

## 2017-01-06 DIAGNOSIS — K5900 Constipation, unspecified: Secondary | ICD-10-CM | POA: Diagnosis not present

## 2017-01-06 DIAGNOSIS — Z87891 Personal history of nicotine dependence: Secondary | ICD-10-CM | POA: Diagnosis not present

## 2017-01-06 DIAGNOSIS — R102 Pelvic and perineal pain: Secondary | ICD-10-CM | POA: Insufficient documentation

## 2017-01-06 DIAGNOSIS — O10913 Unspecified pre-existing hypertension complicating pregnancy, third trimester: Secondary | ICD-10-CM | POA: Insufficient documentation

## 2017-01-06 DIAGNOSIS — O10919 Unspecified pre-existing hypertension complicating pregnancy, unspecified trimester: Secondary | ICD-10-CM

## 2017-01-06 DIAGNOSIS — O9989 Other specified diseases and conditions complicating pregnancy, childbirth and the puerperium: Secondary | ICD-10-CM

## 2017-01-06 DIAGNOSIS — Z7982 Long term (current) use of aspirin: Secondary | ICD-10-CM | POA: Diagnosis not present

## 2017-01-06 DIAGNOSIS — Z3A35 35 weeks gestation of pregnancy: Secondary | ICD-10-CM

## 2017-01-06 DIAGNOSIS — O26893 Other specified pregnancy related conditions, third trimester: Secondary | ICD-10-CM | POA: Insufficient documentation

## 2017-01-06 DIAGNOSIS — O288 Other abnormal findings on antenatal screening of mother: Secondary | ICD-10-CM

## 2017-01-06 LAB — URINALYSIS, ROUTINE W REFLEX MICROSCOPIC
BACTERIA UA: NONE SEEN
BILIRUBIN URINE: NEGATIVE
Glucose, UA: NEGATIVE mg/dL
Ketones, ur: NEGATIVE mg/dL
LEUKOCYTES UA: NEGATIVE
NITRITE: NEGATIVE
PH: 7 (ref 5.0–8.0)
Protein, ur: NEGATIVE mg/dL
SPECIFIC GRAVITY, URINE: 1.017 (ref 1.005–1.030)

## 2017-01-06 MED ORDER — DOCUSATE SODIUM 100 MG PO CAPS: 100.0000 mg | ORAL_CAPSULE | Freq: Two times a day (BID) | ORAL | 0 refills | Status: DC

## 2017-01-06 MED ORDER — POLYETHYLENE GLYCOL 3350 17 G PO PACK: 17.0000 g | PACK | Freq: Every day | ORAL | 0 refills | Status: DC | PRN

## 2017-01-06 NOTE — MAU Provider Note (Signed)
History     CSN: [id number]  Arrival date and time: 01/06/17 1104  First Provider Initiated Contact with Patient 01/06/17 1157   Chief Complaint  Patient presents with  . pelvic pressure  . Pelvic Pain   HPI LEIANA RUND is a 31 y.o. [phone] at [redacted]w[redacted]d who presents with pelvic pain & pressure. Symptoms began 2 days ago. Reports feeling suprapubic pressure that is worse when she has to urinate. Unsure if she's having contractions. Denies dysuria, hematuria, fever, n/v, or flank pain. Denies vaginal bleeding or LOF. Positive fetal movement. Complains of constipation. Last normal BM was a week ago. Had a small BM last night that resulted in small amount of bright red anal bleeding; spotting on toilet paper. Is not taking anything to treat constipation. Anal bleeding has not continued.   OB History    Gravida Para Term Preterm AB Living   SAB TAB Ectopic Multiple Live Births     1 0 0 3      Past Medical History:  Diagnosis Date  . BV (bacterial vaginosis)   . Gestational diabetes   . Hypertension   . Kidney infection   . UTI (lower urinary tract infection)     Past Surgical History:  Procedure Laterality Date  . CESAREAN SECTION     x3    Family History  Problem Relation Age of Onset  . Hypertension Mother   . Heart disease Father   . Asthma Brother     Social History  Substance Use Topics  . Smoking status: Former Smoker    Packs/day: 0.25    Years: 7.00    Types: Cigarettes  . Smokeless tobacco: Former Neurosurgeon     Comment: 3 cigs/day, has used vape pens  . Alcohol use No    Allergies: No Known Allergies  Prescriptions Prior to Admission  Medication Sig Dispense Refill Last Dose  . ACCU-CHEK FASTCLIX LANCETS MISC 1 each by Percutaneous route 4 (four) times daily. 100 each 5 Taking  . acetaminophen (TYLENOL) 500 MG tablet Take 1,000 mg by mouth every 6 (six) hours as needed for mild pain or headache.   Taking  . aspirin EC 81 MG tablet Take  1 tablet (81 mg total) by mouth daily. Take after 12 weeks for prevention of preeclampssia later in pregnancy 300 tablet 2 Taking  . glucose blood (ACCU-CHEK GUIDE) test strip Use as instructed 100 each 5 Taking  . hydrOXYzine (ATARAX/VISTARIL) 25 MG tablet Take 1-2 tablets (25-50 mg total) by mouth every 6 (six) hours as needed for anxiety, nausea or vomiting (insomnia). 30 tablet 2 Taking  . loperamide (IMODIUM A-D) 2 MG tablet Take 1 tablet (2 mg total) by mouth 4 (four) times daily as needed for diarrhea or loose stools. 30 tablet 2 Taking  . oxyCODONE-acetaminophen (PERCOCET/ROXICET) 5-325 MG tablet Take 1-2 tablets by mouth every 6 (six) hours as needed for severe pain. 30 tablet 0 Taking  . Prenat w/o A Vit-FeFum-FePo-FA (CONCEPT OB) 130-92.4-1 MG CAPS Take 1 tablet by mouth daily. 30 capsule 12 Taking  . promethazine (PHENERGAN) 25 MG tablet Take 1 tablet (25 mg total) by mouth every 6 (six) hours as needed for nausea or vomiting. 30 tablet 2 Taking    Review of Systems  Constitutional: Negative.   Gastrointestinal: Positive for anal bleeding and constipation. Negative for abdominal pain, blood in stool, diarrhea, nausea, rectal pain and vomiting.  Genitourinary: Positive for pelvic pain. Negative for  dysuria, flank pain, hematuria, vaginal bleeding and vaginal discharge.   Physical Exam   Blood pressure 111/73, pulse 80, temperature 97.9 F (36.6 C), temperature source Oral, resp. rate 18, height  (1.626 m), weight 183 lb (83 kg), last menstrual period 05/10/2016, SpO2 98 %.  Physical Exam  Nursing note and vitals reviewed. Constitutional: She is oriented to person, place, and time. She appears well-developed and well-nourished. No distress.  HENT:  Head: Normocephalic and atraumatic.  Eyes: Conjunctivae are normal. Right eye exhibits no discharge. Left eye exhibits no discharge. No scleral icterus.  Neck: Normal range of motion.  Cardiovascular: Normal rate, regular rhythm  and normal heart sounds.   No murmur heard. Respiratory: Effort normal and breath sounds normal. No respiratory distress. She has no wheezes.  GI: Soft. There is no tenderness. There is no CVA tenderness.  Genitourinary: Rectal exam shows external hemorrhoid. Rectal exam shows no fissure.  Genitourinary Comments: 2 small (<1cm) flesh colored hemorrhoids  Neurological: She is alert and oriented to person, place, and time.  Skin: Skin is warm and dry. She is not diaphoretic.  Psychiatric: She has a normal mood and affect. Her behavior is normal. Judgment and thought content normal.   Fetal Tracing:  Baseline: 135 Variability: moderate Accelerations: 10x10 Decelerations: none  Toco: UI  Dilation: Closed Effacement (%): Thick Cervical Position: Posterior Station: -3 Exam by:: Estanislado Spire NP  MAU Course  Procedures Results for orders placed or performed during the hospital encounter of 01/06/17 (from the past 24 hour(s))  Urinalysis, Routine w reflex microscopic     Status: Abnormal   Collection Time: 01/06/17 11:15 AM  Result Value Ref Range   Color, Urine YELLOW YELLOW   APPearance CLEAR CLEAR   Specific Gravity, Urine 1.017 1.005 - 1.030   pH 7.0 5.0 - 8.0   Glucose, UA NEGATIVE NEGATIVE mg/dL   Hgb urine dipstick SMALL (A) NEGATIVE   Bilirubin Urine NEGATIVE NEGATIVE   Ketones, ur NEGATIVE NEGATIVE mg/dL   Protein, ur NEGATIVE NEGATIVE mg/dL   Nitrite NEGATIVE NEGATIVE   Leukocytes, UA NEGATIVE NEGATIVE   RBC / HPF 0-5 0 - 5 RBC/hpf   WBC, UA 0-5 0 - 5 WBC/hpf   Bacteria, UA NONE SEEN NONE SEEN   Squamous Epithelial / LPF 0-5 (A) NONE SEEN   Mucous PRESENT     MDM Category 1 tracing but not reactive -- BPP ordered. Pt having weekly BPPs d/t CHTN & GDM; last BBP over a week ago.  BPP 6/8, off for breathing, AFI 15; NST became reactive prior to patient going to ultrasound S/w Dr. Adrian Blackwater regarding tracing & BPP -- ok to discharge home -- pt has f/u appt & BPP in 2  days  Assessment and Plan  A; 1. Constipation during pregnancy in third trimester   2. Chronic hypertension during pregnancy, antepartum   3. NST (non-stress test) nonreactive   4. [redacted] weeks gestation of pregnancy    P: Discharge home Rx colace & miralax Discussed reasons to return to MAU Keep appointment with OB  Judeth Horn 01/06/2017, 11:37 AM

## 2017-01-06 NOTE — Discharge Instructions (Signed)

## 2017-01-06 NOTE — MAU Note (Signed)
Increase in pelvic pain and pressure last 2 days.  Has to really push to urinate. Had a small gush a couple nights ago, did not continue, not sure if she peed on herself or what.  Has been constipated, ? Hemorrhoids- had some blood when wiped after trying to have bm

## 2017-01-08 ENCOUNTER — Ambulatory Visit (HOSPITAL_COMMUNITY): Admission: RE | Admit: 2017-01-08 | Payer: Medicaid Other | Source: Ambulatory Visit

## 2017-01-12 ENCOUNTER — Encounter: Payer: Medicaid Other | Admitting: Obstetrics and Gynecology

## 2017-01-15 ENCOUNTER — Other Ambulatory Visit: Payer: Self-pay | Admitting: Obstetrics & Gynecology

## 2017-01-15 ENCOUNTER — Ambulatory Visit (HOSPITAL_COMMUNITY)
Admission: RE | Admit: 2017-01-15 | Discharge: 2017-01-15 | Disposition: A | Discharge status: Home / Self Care | Payer: Medicaid Other | Source: Ambulatory Visit | Attending: Obstetrics & Gynecology | Admitting: Obstetrics & Gynecology

## 2017-01-15 ENCOUNTER — Encounter (HOSPITAL_COMMUNITY): Payer: Self-pay

## 2017-01-15 DIAGNOSIS — O0993 Supervision of high risk pregnancy, unspecified, third trimester: Secondary | ICD-10-CM

## 2017-01-15 DIAGNOSIS — O09899 Supervision of other high risk pregnancies, unspecified trimester: Secondary | ICD-10-CM

## 2017-01-15 DIAGNOSIS — O24410 Gestational diabetes mellitus in pregnancy, diet controlled: Secondary | ICD-10-CM | POA: Diagnosis present

## 2017-01-15 DIAGNOSIS — O10013 Pre-existing essential hypertension complicating pregnancy, third trimester: Secondary | ICD-10-CM

## 2017-01-15 DIAGNOSIS — Z3A36 36 weeks gestation of pregnancy: Secondary | ICD-10-CM | POA: Diagnosis not present

## 2017-01-15 DIAGNOSIS — O24419 Gestational diabetes mellitus in pregnancy, unspecified control: Secondary | ICD-10-CM

## 2017-01-15 DIAGNOSIS — O09219 Supervision of pregnancy with history of pre-term labor, unspecified trimester: Secondary | ICD-10-CM | POA: Diagnosis not present

## 2017-01-15 DIAGNOSIS — O34219 Maternal care for unspecified type scar from previous cesarean delivery: Secondary | ICD-10-CM

## 2017-01-15 DIAGNOSIS — O10919 Unspecified pre-existing hypertension complicating pregnancy, unspecified trimester: Secondary | ICD-10-CM

## 2017-01-16 ENCOUNTER — Other Ambulatory Visit: Payer: Self-pay | Admitting: Obstetrics & Gynecology

## 2017-01-16 DIAGNOSIS — O09219 Supervision of pregnancy with history of pre-term labor, unspecified trimester: Secondary | ICD-10-CM

## 2017-01-16 DIAGNOSIS — O34219 Maternal care for unspecified type scar from previous cesarean delivery: Secondary | ICD-10-CM

## 2017-01-16 DIAGNOSIS — O24410 Gestational diabetes mellitus in pregnancy, diet controlled: Secondary | ICD-10-CM

## 2017-01-16 DIAGNOSIS — Z3A36 36 weeks gestation of pregnancy: Secondary | ICD-10-CM

## 2017-01-16 DIAGNOSIS — O10013 Pre-existing essential hypertension complicating pregnancy, third trimester: Secondary | ICD-10-CM

## 2017-01-16 DIAGNOSIS — O09899 Supervision of other high risk pregnancies, unspecified trimester: Secondary | ICD-10-CM

## 2017-01-19 ENCOUNTER — Ambulatory Visit (INDEPENDENT_AMBULATORY_CARE_PROVIDER_SITE_OTHER): Payer: Medicaid Other | Admitting: Obstetrics and Gynecology

## 2017-01-19 VITALS — BP 138/87 | HR 97 | Wt 185.2 lb

## 2017-01-19 DIAGNOSIS — O10919 Unspecified pre-existing hypertension complicating pregnancy, unspecified trimester: Secondary | ICD-10-CM | POA: Diagnosis not present

## 2017-01-19 DIAGNOSIS — R87810 Cervical high risk human papillomavirus (HPV) DNA test positive: Secondary | ICD-10-CM

## 2017-01-19 DIAGNOSIS — R87610 Atypical squamous cells of undetermined significance on cytologic smear of cervix (ASC-US): Secondary | ICD-10-CM | POA: Diagnosis not present

## 2017-01-19 DIAGNOSIS — Z87440 Personal history of urinary (tract) infections: Secondary | ICD-10-CM

## 2017-01-19 DIAGNOSIS — O24410 Gestational diabetes mellitus in pregnancy, diet controlled: Secondary | ICD-10-CM

## 2017-01-19 DIAGNOSIS — O0993 Supervision of high risk pregnancy, unspecified, third trimester: Secondary | ICD-10-CM | POA: Diagnosis not present

## 2017-01-19 NOTE — Progress Notes (Signed)
   PRENATAL VISIT NOTE  Subjective:  Lisa Vazquez is a 31 y.o. [phone] at [redacted]w[redacted]d being seen today for ongoing prenatal care.  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  Patient reports no complaints.  Contractions: Not present. Vag. Bleeding: None.  Movement: Present. Denies leaking of fluid.   The following portions of the patient's history were reviewed and updated as appropriate: allergies, current medications, past family history, past medical history, past social history, past surgical history and problem list. Problem list updated.  Objective:   Vitals:   01/19/17 0847  BP: 138/87  Pulse: 97  Weight: 185 lb 3.2 oz (84 kg)    Fetal Status: Fetal Heart Rate (bpm): NST Fundal Height: 36 cm Movement: Present  Presentation: Vertex  General:  Alert, oriented and cooperative. Patient is in no acute distress.  Skin: Skin is warm and dry. No rash noted.   Cardiovascular: Normal heart rate noted  Respiratory: Normal respiratory effort, no problems with respiration noted  Abdomen: Soft, gravid, appropriate for gestational age. Pain/Pressure: Present     Pelvic:  Cervical exam performed Dilation: Closed Effacement (%): Thick Station: Ballotable  Extremities: Normal range of motion.  Edema: Trace  Mental Status: Normal mood and affect. Normal behavior. Normal judgment and thought content.   Assessment and Plan:    Pregnancy: [id number] at [redacted]w[redacted]d  1. Chronic hypertension during pregnancy, antepartum BP well controlled Normal growth on 4/26  2. Atypical squamous cell changes of undetermined significance (ASCUS) on cervical cytology with positive high risk human papilloma virus (HPV) colpo pp  3. Supervision of high risk pregnancy in third trimester Patient is doing well without complaints - GC/Chlamydia probe amp ([place])not at [facility] - Strep Gp B NAA  4. History of recurrent UTIs Continue daily suppression  5. Diet controlled gestational diabetes mellitus (GDM), antepartum Patient did not bring CBG log but reports values all within normal range - Amniotic fluid index - Fetal nonstress test- NST reviewed and reactive  Preterm labor symptoms and general obstetric precautions including but not limited to vaginal bleeding, contractions, leaking of fluid and fetal movement were reviewed in detail with the patient. Please refer to After Visit Summary for other counseling recommendations.  Return for NST.   Catalina Antigua, MD

## 2017-01-21 LAB — STREP GP B NAA: STREP GROUP B AG: NEGATIVE

## 2017-01-22 ENCOUNTER — Other Ambulatory Visit: Payer: Medicaid Other

## 2017-01-22 LAB — STREP GP B NAA

## 2017-01-23 ENCOUNTER — Other Ambulatory Visit: Payer: Medicaid Other

## 2017-01-26 ENCOUNTER — Other Ambulatory Visit (HOSPITAL_COMMUNITY)
Admission: RE | Admit: 2017-01-26 | Discharge: 2017-01-26 | Disposition: A | Discharge status: Home / Self Care | Payer: Medicaid Other | Source: Ambulatory Visit | Attending: Obstetrics and Gynecology | Admitting: Obstetrics and Gynecology

## 2017-01-26 ENCOUNTER — Ambulatory Visit (INDEPENDENT_AMBULATORY_CARE_PROVIDER_SITE_OTHER): Payer: Medicaid Other | Admitting: Obstetrics and Gynecology

## 2017-01-26 VITALS — BP 132/89 | HR 98 | Wt 182.0 lb

## 2017-01-26 DIAGNOSIS — O0993 Supervision of high risk pregnancy, unspecified, third trimester: Secondary | ICD-10-CM

## 2017-01-26 DIAGNOSIS — O10919 Unspecified pre-existing hypertension complicating pregnancy, unspecified trimester: Secondary | ICD-10-CM | POA: Diagnosis not present

## 2017-01-26 DIAGNOSIS — O2303 Infections of kidney in pregnancy, third trimester: Secondary | ICD-10-CM | POA: Diagnosis not present

## 2017-01-26 DIAGNOSIS — O24410 Gestational diabetes mellitus in pregnancy, diet controlled: Secondary | ICD-10-CM | POA: Diagnosis not present

## 2017-01-26 DIAGNOSIS — F1120 Opioid dependence, uncomplicated: Secondary | ICD-10-CM

## 2017-01-26 DIAGNOSIS — Z79891 Long term (current) use of opiate analgesic: Secondary | ICD-10-CM

## 2017-01-26 DIAGNOSIS — O99320 Drug use complicating pregnancy, unspecified trimester: Secondary | ICD-10-CM

## 2017-01-26 NOTE — Progress Notes (Signed)
   PRENATAL VISIT NOTE  Subjective:  Lisa Vazquez is a 31 y.o. [phone] at 1327w6d being seen today for ongoing prenatal care.  She is currently monitored for the following issues for this high-risk pregnancy and has Atypical squamous cell changes of undetermined significance (ASCUS) on cervical cytology with positive high risk human papilloma virus (HPV); CIN I (cervical intraepithelial neoplasia I); Supervision of high-risk pregnancy; Chronic hypertension during pregnancy, antepartum; History of recurrent UTIs; Marijuana use; Substance abuse affecting pregnancy in first trimester, antepartum; High blood hemoglobin F (HCC); Pyelonephritis affecting pregnancy in third trimester; Gestational diabetes mellitus, antepartum; and Pregnancy complicated by subutex maintenance, antepartum (HCC) on her problem list.  Patient reports no complaints. Patient admitted to starting subutex a month ago to help with the withdrawal symptoms she experienced from discontinuing percocet. Contractions: Not present. Vag. Bleeding: None.  Movement: Present. Denies leaking of fluid.   The following portions of the patient's history were reviewed and updated as appropriate: allergies, current medications, past family history, past medical history, past social history, past surgical history and problem list. Problem list updated.  Objective:   Vitals:   01/26/17 1021  BP: 132/89  Pulse: 98  Weight: 182 lb (82.6 kg)    Fetal Status: Fetal Heart Rate (bpm): NST   Movement: Present     General:  Alert, oriented and cooperative. Patient is in no acute distress.  Skin: Skin is warm and dry. No rash noted.   Cardiovascular: Normal heart rate noted  Respiratory: Normal respiratory effort, no problems with respiration noted  Abdomen: Soft, gravid, appropriate for gestational age. Pain/Pressure: Present     Pelvic:  Cervical exam deferred        Extremities: Normal range of motion.  Edema: Trace  Mental Status: Normal  mood and affect. Normal behavior. Normal judgment and thought content.   Assessment and Plan:  Pregnancy: [id number] at 1527w6d  1. Supervision of high risk pregnancy in third trimester Patient is doing well without complaints Scheduled for RCS on 5/16 with BTL - Fetal nonstress test; Future- reviewed and reactive with baseline 140, mod variability, +accels, no decels - GC[facility]Chlamydia probe amp ([place] - US MFM FETAL BPP WO NON STRESS; Future  2. Diet controlled gestational diabetes mellitus (GDM), antepartum Patient did not bring log book but reports values between 80-121 BPP scheduled later this week - Fetal nonstress test; Future - US MFM FETAL BPP WO NON STRESS; Future  3. Pyelonephritis affecting pregnancy in third trimester Continue suppression  4. Chronic hypertension during pregnancy, antepartum Not currently on any medication - US MFM FETAL BPP WO NON STRESS; Future  5. Pregnancy complicated by subutex maintenance, antepartum [facility](HCC) Patient admitted to being prescribed 16 mg daily. She only takes 8 mg daily. She is very ashamed of having to be treated as an addict. She does not want FOB to know due to fear that he will leave her and her children. He is providing financial support for all of them at this time.  Emotional support provided.  Term labor symptoms and general obstetric precautions including but not limited to vaginal bleeding, contractions, leaking of fluid and fetal movement were reviewed in detail with the patient. Please refer to After Visit Summary for other counseling recommendations.  Return in about 1 week (around 02/02/2017) for ROB, NST.   Suleyman Ehrman, Gigi GinPeggy, MD

## 2017-01-26 NOTE — Progress Notes (Signed)
Patient reports doing well today. Patient reports normal glucose readings.

## 2017-01-27 ENCOUNTER — Telehealth (HOSPITAL_COMMUNITY): Payer: Self-pay | Admitting: *Deleted

## 2017-01-27 LAB — GC/CHLAMYDIA PROBE AMP (~~LOC~~) NOT AT ARMC
CHLAMYDIA, DNA PROBE: NEGATIVE
Neisseria Gonorrhea: NEGATIVE

## 2017-01-27 NOTE — Telephone Encounter (Signed)
Preadmission screen  

## 2017-01-28 ENCOUNTER — Encounter (HOSPITAL_COMMUNITY): Payer: Self-pay

## 2017-01-28 ENCOUNTER — Ambulatory Visit (HOSPITAL_COMMUNITY): Admission: RE | Admit: 2017-01-28 | Payer: Medicaid Other | Source: Ambulatory Visit

## 2017-01-29 ENCOUNTER — Inpatient Hospital Stay (HOSPITAL_COMMUNITY)
Admission: AD | Admit: 2017-01-29 | Discharge: 2017-01-29 | Disposition: A | Discharge status: Home / Self Care | Payer: Medicaid Other | Source: Ambulatory Visit | Attending: Obstetrics and Gynecology | Admitting: Obstetrics and Gynecology

## 2017-01-29 ENCOUNTER — Encounter (HOSPITAL_COMMUNITY): Payer: Self-pay

## 2017-01-29 DIAGNOSIS — Z79899 Other long term (current) drug therapy: Secondary | ICD-10-CM | POA: Insufficient documentation

## 2017-01-29 DIAGNOSIS — Z3A38 38 weeks gestation of pregnancy: Secondary | ICD-10-CM | POA: Insufficient documentation

## 2017-01-29 DIAGNOSIS — O99320 Drug use complicating pregnancy, unspecified trimester: Secondary | ICD-10-CM

## 2017-01-29 DIAGNOSIS — R109 Unspecified abdominal pain: Secondary | ICD-10-CM | POA: Diagnosis present

## 2017-01-29 DIAGNOSIS — O99333 Smoking (tobacco) complicating pregnancy, third trimester: Secondary | ICD-10-CM | POA: Diagnosis not present

## 2017-01-29 DIAGNOSIS — F1120 Opioid dependence, uncomplicated: Secondary | ICD-10-CM | POA: Diagnosis not present

## 2017-01-29 DIAGNOSIS — F17210 Nicotine dependence, cigarettes, uncomplicated: Secondary | ICD-10-CM | POA: Insufficient documentation

## 2017-01-29 DIAGNOSIS — Z7982 Long term (current) use of aspirin: Secondary | ICD-10-CM | POA: Insufficient documentation

## 2017-01-29 DIAGNOSIS — O26893 Other specified pregnancy related conditions, third trimester: Secondary | ICD-10-CM | POA: Diagnosis not present

## 2017-01-29 DIAGNOSIS — O163 Unspecified maternal hypertension, third trimester: Secondary | ICD-10-CM | POA: Diagnosis not present

## 2017-01-29 DIAGNOSIS — O10919 Unspecified pre-existing hypertension complicating pregnancy, unspecified trimester: Secondary | ICD-10-CM

## 2017-01-29 DIAGNOSIS — O471 False labor at or after 37 completed weeks of gestation: Secondary | ICD-10-CM

## 2017-01-29 DIAGNOSIS — O26899 Other specified pregnancy related conditions, unspecified trimester: Secondary | ICD-10-CM

## 2017-01-29 DIAGNOSIS — O479 False labor, unspecified: Secondary | ICD-10-CM | POA: Diagnosis not present

## 2017-01-29 DIAGNOSIS — N949 Unspecified condition associated with female genital organs and menstrual cycle: Secondary | ICD-10-CM

## 2017-01-29 DIAGNOSIS — O34219 Maternal care for unspecified type scar from previous cesarean delivery: Secondary | ICD-10-CM

## 2017-01-29 DIAGNOSIS — O99321 Drug use complicating pregnancy, first trimester: Secondary | ICD-10-CM

## 2017-01-29 LAB — WET PREP, GENITAL
Sperm: NONE SEEN
TRICH WET PREP: NONE SEEN
Yeast Wet Prep HPF POC: NONE SEEN

## 2017-01-29 LAB — URINALYSIS, ROUTINE W REFLEX MICROSCOPIC
Bilirubin Urine: NEGATIVE
GLUCOSE, UA: NEGATIVE mg/dL
KETONES UR: NEGATIVE mg/dL
LEUKOCYTES UA: NEGATIVE
Nitrite: NEGATIVE
PROTEIN: 30 mg/dL — AB
Specific Gravity, Urine: 1.016 (ref 1.005–1.030)
pH: 7 (ref 5.0–8.0)

## 2017-01-29 NOTE — Discharge Instructions (Signed)

## 2017-01-29 NOTE — MAU Note (Signed)
Pt had to leave, but was waiting on results from one test. MAU provider will call pt with results. Not AMA

## 2017-01-29 NOTE — MAU Note (Signed)
Pt reports she has had contraction on and off since yesterday. Ctx about q 20 min. Denies any vag bleeding or leaking at  This time. Good fetal movement reported.

## 2017-01-29 NOTE — MAU Provider Note (Signed)
Chief Complaint:  Labor Eval   First Provider Initiated Contact with Patient 01/29/17 1113      HPI: Lisa Vazquez is a 31 y.o. [phone] at [redacted]w[redacted]d who presents to maternity admissions reporting constant low abdominal pressure/pain with intermittent sharp pain radiating down into her vagina and around to her low back. She reports this pain as intermittent, occurring 3-4 times per hour and severe in intensity.  She reports it is unchanged in intensity since onset yesterday.  She has tried changing positions and standing and walking make the pain worse. She is drinking fluids, which has not helped.  Nothing makes the pain better. She also reports thick white discharge without odor.  There are no other associated symptoms. She has history of 3 previous C/S and has never experienced labor. She is concerned that this could be labor. She reports good fetal movement, denies LOF, vaginal bleeding, vaginal itching/burning, urinary symptoms, h/a, dizziness, n/v, or fever/chills.    HPI  Past Medical History: Past Medical History:  Diagnosis Date  . BV (bacterial vaginosis)   . Gestational diabetes   . Hypertension   . Kidney infection   . UTI (lower urinary tract infection)     Past obstetric history: OB History  Gravida Para Term Preterm AB Living  5 3 1 2 1 3   SAB TAB Ectopic Multiple Live Births    1 0 0 3    # Outcome Date GA Lbr Len/2nd Weight Sex Delivery Anes PTL Lv  5 Current           4 Term 09/19/09    M CS-LTranv Spinal  LIV  3 Preterm 04/26/04 [redacted]w[redacted]d   M CS-LTranv Spinal  LIV  2 Preterm 06/30/03 [redacted]w[redacted]d   F CS-LTranv Spinal  LIV  1 TAB               Past Surgical History: Past Surgical History:  Procedure Laterality Date  . CESAREAN SECTION     x3    Family History: Family History  Problem Relation Age of Onset  . Hypertension Mother   . Heart disease Father   . Asthma Brother     Social History: Social History  Substance Use Topics  . Smoking status: Current  Some Day Smoker    Packs/day: 0.25    Years: 7.00    Types: Cigarettes  . Smokeless tobacco: Former Neurosurgeon     Comment: 3 cigs/day, has used vape pens  . Alcohol use No    Allergies: No Known Allergies  Meds:  Prescriptions Prior to Admission  Medication Sig Dispense Refill Last Dose  . acetaminophen (TYLENOL) 500 MG tablet Take 1,000 mg by mouth every 6 (six) hours as needed for mild pain or headache.   Past Month at Unknown time  . aspirin EC 81 MG tablet Take 1 tablet (81 mg total) by mouth daily. Take after 12 weeks for prevention of preeclampssia later in pregnancy 300 tablet 2 Past Week at Unknown time  . Prenat w/o A Vit-FeFum-FePo-FA (CONCEPT OB) 130-92.4-1 MG CAPS Take 1 tablet by mouth daily. 30 capsule 12 01/29/2017 at Unknown time  . ACCU-CHEK FASTCLIX LANCETS MISC 1 each by Percutaneous route 4 (four) times daily. 100 each 5 Taking  . docusate sodium (COLACE) 100 MG capsule Take 1 capsule (100 mg total) by mouth 2 (two) times daily. (Patient not taking: Reported on 01/15/2017) 60 capsule 0 Not Taking at Unknown time  . glucose blood (ACCU-CHEK GUIDE) test strip Use as instructed  100 each 5 Taking  . hydrOXYzine (ATARAX/VISTARIL) 25 MG tablet Take 1-2 tablets (25-50 mg total) by mouth every 6 (six) hours as needed for anxiety, nausea or vomiting (insomnia). (Patient not taking: Reported on 01/15/2017) 30 tablet 2 Not Taking at Unknown time  . oxyCODONE-acetaminophen (PERCOCET/ROXICET) 5-325 MG tablet Take 1-2 tablets by mouth every 6 (six) hours as needed for severe pain. (Patient not taking: Reported on 01/15/2017) 30 tablet 0 Not Taking at Unknown time  . polyethylene glycol (MIRALAX / GLYCOLAX) packet Take 17 g by mouth daily as needed. (Patient not taking: Reported on 01/19/2017) 10 each 0 Not Taking at Unknown time  . promethazine (PHENERGAN) 25 MG tablet Take 1 tablet (25 mg total) by mouth every 6 (six) hours as needed for nausea or vomiting. (Patient not taking: Reported on  01/19/2017) 30 tablet 2 Not Taking at Unknown time    ROS:  Review of Systems  Constitutional: Negative for chills, fatigue and fever.  Eyes: Negative for visual disturbance.  Respiratory: Negative for shortness of breath.   Cardiovascular: Negative for chest pain.  Gastrointestinal: Positive for abdominal pain. Negative for nausea and vomiting.  Genitourinary: Positive for pelvic pain. Negative for difficulty urinating, dysuria, flank pain, vaginal bleeding, vaginal discharge and vaginal pain.  Musculoskeletal: Positive for back pain.  Neurological: Negative for dizziness and headaches.  Psychiatric/Behavioral: Negative.      I have reviewed patient's Past Medical Hx, Surgical Hx, Family Hx, Social Hx, medications and allergies.   Physical Exam   Patient Vitals for the past 24 hrs:  BP Temp Temp src Pulse Resp Height Weight  01/29/17 1042 125/86 98.7 F (37.1 C) Oral 96 18 5\' 4"  (1.626 m) 182 lb 6.4 oz (82.7 kg)   Constitutional: Well-developed, well-nourished female in no acute distress.  Cardiovascular: normal rate Respiratory: normal effort GI: Abd soft, non-tender, gravid appropriate for gestational age.  MS: Extremities nontender, no edema, normal ROM Neurologic: Alert and oriented x 4.  GU: Neg CVAT.  PELVIC EXAM: Wet prep collected by blind swab  Dilation: Closed Effacement (%): Thick Cervical Position: Posterior Exam by:: L Leftwich-Kirby CNM  FHT:  Baseline 135 , moderate variability, accelerations present, no decelerations Contractions: rare, mild to palpation   Labs: Results for orders placed or performed during the hospital encounter of 01/29/17 (from the past 24 hour(s))  Wet prep, genital     Status: Abnormal   Collection Time: 01/29/17 11:17 AM  Result Value Ref Range   Yeast Wet Prep HPF POC NONE SEEN NONE SEEN   Trich, Wet Prep NONE SEEN NONE SEEN   Clue Cells Wet Prep HPF POC PRESENT (A) NONE SEEN   WBC, Wet Prep HPF POC FEW (A) NONE SEEN   Sperm  NONE SEEN    --/--/A POS (02/18 0245)  Imaging:    MAU Course/MDM: Reviewed pt prenatal record.  Hx significant for C/S x 3, CHTN with planned repeat C/S at 39 weeks (on 5/I6/18), on Subutex for narcotic abuse, pyelonephritis in 3rd trimester this pregnancy I have ordered labs (U/A, wet prep) and reviewed results.  NST reviewed Wet prep positive for clue cells but no odor or other symptoms so will not treat today BP wnl today so no further evaluation or change to care needed Cervix closed and thick so no evidence of active labor today Pt to f/u in office this week, with scheduled C/S as planned Pt stable at time of discharge.    Assessment: 1. Threatened labor at term  2. Pregnancy complicated by subutex maintenance, antepartum (HCC)   3. 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 Chronic hypertension during pregnancy, antepartum   5. Hx of cesarean section complicating pregnancy   6. Abdominal pain affecting pregnancy   7. Pelvic pressure in pregnancy, antepartum, third trimester     Plan: Discharge home Labor precautions and fetal kick counts Follow-up Information    [facility] Follow up.   Why:  As scheduled, return to MAU with signs of labor or emergencies Contact information: [address] [place] [address] [phone]         Allergies as of 01/29/2017   No Known Allergies     Medication List    STOP taking these medications   docusate sodium 100 MG capsule Commonly known as:  COLACE   hydrOXYzine 25 MG tablet Commonly known as:  ATARAX/VISTARIL   oxyCODONE-acetaminophen 5-325 MG tablet Commonly known as:  PERCOCET/ROXICET   polyethylene glycol packet Commonly known as:  MIRALAX / GLYCOLAX   promethazine 25 MG tablet Commonly known as:  PHENERGAN     TAKE these medications   ACCU-CHEK FASTCLIX LANCETS Misc 1 each by Percutaneous route 4 (four) times daily.    acetaminophen 500 MG tablet Commonly known as:  TYLENOL Take 1,000 mg by mouth every 6 (six) hours as needed for mild pain or headache.   aspirin EC 81 MG tablet Take 1 tablet (81 mg total) by mouth daily. Take after 12 weeks for prevention of preeclampssia later in pregnancy   CONCEPT OB 130-92.4-1 MG Caps Take 1 tablet by mouth daily.   glucose blood test strip Commonly known as:  ACCU-CHEK GUIDE Use as instructed       Sharen Counter Certified Nurse-Midwife 01/29/2017 12:29 PM

## 2017-01-30 ENCOUNTER — Telehealth (HOSPITAL_COMMUNITY): Payer: Self-pay | Admitting: *Deleted

## 2017-01-30 NOTE — Telephone Encounter (Signed)
Preadmission screen  

## 2017-02-02 ENCOUNTER — Encounter (HOSPITAL_COMMUNITY): Payer: Self-pay

## 2017-02-02 ENCOUNTER — Encounter: Payer: Medicaid Other | Admitting: Obstetrics and Gynecology

## 2017-02-03 ENCOUNTER — Ambulatory Visit (HOSPITAL_COMMUNITY)
Admission: RE | Admit: 2017-02-03 | Discharge: 2017-02-03 | Disposition: A | Discharge status: Home / Self Care | Payer: Medicaid Other | Source: Ambulatory Visit | Attending: Obstetrics and Gynecology | Admitting: Obstetrics and Gynecology

## 2017-02-03 ENCOUNTER — Ambulatory Visit (INDEPENDENT_AMBULATORY_CARE_PROVIDER_SITE_OTHER): Payer: Medicaid Other | Admitting: Obstetrics and Gynecology

## 2017-02-03 ENCOUNTER — Encounter (HOSPITAL_COMMUNITY)
Admission: RE | Admit: 2017-02-03 | Discharge: 2017-02-03 | Disposition: A | Discharge status: Home / Self Care | Payer: Medicaid Other | Source: Ambulatory Visit | Attending: Obstetrics and Gynecology | Admitting: Obstetrics and Gynecology

## 2017-02-03 ENCOUNTER — Encounter (HOSPITAL_COMMUNITY): Payer: Self-pay

## 2017-02-03 ENCOUNTER — Inpatient Hospital Stay (EMERGENCY_DEPARTMENT_HOSPITAL)
Admission: AD | Admit: 2017-02-03 | Discharge: 2017-02-03 | Disposition: A | Discharge status: Home / Self Care | Payer: Medicaid Other | Source: Ambulatory Visit | Attending: Obstetrics and Gynecology | Admitting: Obstetrics and Gynecology

## 2017-02-03 VITALS — BP 123/84 | HR 94 | Wt 184.3 lb

## 2017-02-03 DIAGNOSIS — O99321 Drug use complicating pregnancy, first trimester: Secondary | ICD-10-CM

## 2017-02-03 DIAGNOSIS — O99323 Drug use complicating pregnancy, third trimester: Secondary | ICD-10-CM | POA: Diagnosis not present

## 2017-02-03 DIAGNOSIS — O10013 Pre-existing essential hypertension complicating pregnancy, third trimester: Secondary | ICD-10-CM | POA: Insufficient documentation

## 2017-02-03 DIAGNOSIS — O10913 Unspecified pre-existing hypertension complicating pregnancy, third trimester: Secondary | ICD-10-CM | POA: Diagnosis present

## 2017-02-03 DIAGNOSIS — O9989 Other specified diseases and conditions complicating pregnancy, childbirth and the puerperium: Secondary | ICD-10-CM

## 2017-02-03 DIAGNOSIS — R87610 Atypical squamous cells of undetermined significance on cytologic smear of cervix (ASC-US): Secondary | ICD-10-CM

## 2017-02-03 DIAGNOSIS — F1120 Opioid dependence, uncomplicated: Secondary | ICD-10-CM | POA: Diagnosis not present

## 2017-02-03 DIAGNOSIS — O0993 Supervision of high risk pregnancy, unspecified, third trimester: Secondary | ICD-10-CM

## 2017-02-03 DIAGNOSIS — Z79891 Long term (current) use of opiate analgesic: Secondary | ICD-10-CM | POA: Diagnosis not present

## 2017-02-03 DIAGNOSIS — O10919 Unspecified pre-existing hypertension complicating pregnancy, unspecified trimester: Secondary | ICD-10-CM

## 2017-02-03 DIAGNOSIS — O289 Unspecified abnormal findings on antenatal screening of mother: Secondary | ICD-10-CM

## 2017-02-03 DIAGNOSIS — O24410 Gestational diabetes mellitus in pregnancy, diet controlled: Secondary | ICD-10-CM

## 2017-02-03 DIAGNOSIS — O34219 Maternal care for unspecified type scar from previous cesarean delivery: Secondary | ICD-10-CM | POA: Insufficient documentation

## 2017-02-03 DIAGNOSIS — Z98891 History of uterine scar from previous surgery: Secondary | ICD-10-CM | POA: Insufficient documentation

## 2017-02-03 DIAGNOSIS — O09219 Supervision of pregnancy with history of pre-term labor, unspecified trimester: Secondary | ICD-10-CM | POA: Insufficient documentation

## 2017-02-03 DIAGNOSIS — O99320 Drug use complicating pregnancy, unspecified trimester: Secondary | ICD-10-CM

## 2017-02-03 DIAGNOSIS — O2303 Infections of kidney in pregnancy, third trimester: Secondary | ICD-10-CM | POA: Diagnosis not present

## 2017-02-03 DIAGNOSIS — R87810 Cervical high risk human papillomavirus (HPV) DNA test positive: Secondary | ICD-10-CM | POA: Diagnosis not present

## 2017-02-03 DIAGNOSIS — Z3A39 39 weeks gestation of pregnancy: Secondary | ICD-10-CM

## 2017-02-03 HISTORY — DX: History of uterine scar from previous surgery: Z98.891

## 2017-02-03 LAB — CBC
HEMATOCRIT: 34.9 % — AB (ref 36.0–46.0)
Hemoglobin: 11.3 g/dL — ABNORMAL LOW (ref 12.0–15.0)
MCH: 24.6 pg — ABNORMAL LOW (ref 26.0–34.0)
MCHC: 32.4 g/dL (ref 30.0–36.0)
MCV: 76 fL — AB (ref 78.0–100.0)
Platelets: 208 10*3/uL (ref 150–400)
RBC: 4.59 MIL/uL (ref 3.87–5.11)
RDW: 16.7 % — AB (ref 11.5–15.5)
WBC: 8 10*3/uL (ref 4.0–10.5)

## 2017-02-03 LAB — TYPE AND SCREEN
ABO/RH(D): A POS
ANTIBODY SCREEN: NEGATIVE

## 2017-02-03 NOTE — Discharge Instructions (Signed)
Fetal Movement Counts  Patient Name: ________________________________________________ Patient Due Date: ____________________  What is a fetal movement count?  A fetal movement count is the number of times that you feel your baby move during a certain amount of time. This may also be called a fetal kick count. A fetal movement count is recommended for every pregnant woman. You may be asked to start counting fetal movements as early as week 28 of your pregnancy.  Pay attention to when your baby is most active. You may notice your baby's sleep and wake cycles. You may also notice things that make your baby move more. You should do a fetal movement count:  · When your baby is normally most active.  · At the same time each day.    A good time to count movements is while you are resting, after having something to eat and drink.  How do I count fetal movements?  1. Find a quiet, comfortable area. Sit, or lie down on your side.  2. Write down the date, the start time and stop time, and the number of movements that you felt between those two times. Take this information with you to your health care visits.  3. For 2 hours, count kicks, flutters, swishes, rolls, and jabs. You should feel at least 10 movements during 2 hours.  4. You may stop counting after you have felt 10 movements.  5. If you do not feel 10 movements in 2 hours, have something to eat and drink. Then, keep resting and counting for 1 hour. If you feel at least 4 movements during that hour, you may stop counting.  Contact a health care provider if:  · You feel fewer than 4 movements in 2 hours.  · Your baby is not moving like he or she usually does.  Date: ____________ Start time: ____________ Stop time: ____________ Movements: ____________  Date: ____________ Start time: ____________ Stop time: ____________ Movements: ____________  Date: ____________ Start time: ____________ Stop time: ____________ Movements: ____________  Date: ____________ Start time:  ____________ Stop time: ____________ Movements: ____________  Date: ____________ Start time: ____________ Stop time: ____________ Movements: ____________  Date: ____________ Start time: ____________ Stop time: ____________ Movements: ____________  Date: ____________ Start time: ____________ Stop time: ____________ Movements: ____________  Date: ____________ Start time: ____________ Stop time: ____________ Movements: ____________  Date: ____________ Start time: ____________ Stop time: ____________ Movements: ____________  This information is not intended to replace advice given to you by your health care provider. Make sure you discuss any questions you have with your health care provider.  Document Released: 10/08/2006 Document Revised: 05/07/2016 Document Reviewed: 10/18/2015  Elsevier Interactive Patient Education © 2017 [employer]

## 2017-02-03 NOTE — MAU Note (Signed)
Pt sent from MFM due to non-reactive NST and BPP 6/10. Pt denies bleeding and contractions. Pt states baby wasn't moving as much earlier today but is moving normally now.

## 2017-02-03 NOTE — ED Notes (Signed)
Report called to MAU for further monitoring after BPP 6/8 in [facility].  RN walked pt to MAU and signed her in at the desk.  Ginger Morris, RN made aware of pt arrival.

## 2017-02-03 NOTE — Patient Instructions (Signed)
20 Lisa IslamDominique M Vazquez  02/03/2017   Your procedure is scheduled on:  02/04/2017  [facility].  Pick up the phone at the desk and dial [phone].   Call this number if you have problems the morning of surgery: [phone]   Remember:   Do not eat food:After Midnight.  Do not drink clear liquids: After Midnight.  Take these medicines the morning of surgery with A SIP OF WATER: take your subutex as usual   Do not wear jewelry, make-up or nail polish.  Do not wear lotions, powders, or perfumes. Do not wear deodorant.  Do not shave 48 hours prior to surgery.  Do not bring valuables to the hospital.  [facility] is not   responsible for any belongings or valuables brought to the hospital.  Contacts, dentures or bridgework may not be worn into surgery.  Leave suitcase in the car. After surgery it may be brought to your room.  For patients admitted to the hospital, checkout time is 11:00 AM the day of              discharge.   Patients discharged the day of surgery will not be allowed to drive             home.  Name and phone number of your driver: na  Special Instructions:   N/A   Please read over the following fact sheets that you were given:   Surgical Site Infection Prevention

## 2017-02-03 NOTE — Progress Notes (Signed)
Prenatal Visit Note Date: 02/03/2017 Clinic: [facility]  Subjective:  Lisa Vazquez is a 31 y.o. [id number] at 10622w0d being seen today for ongoing prenatal care.  She is currently monitored for the following issues for this high-risk pregnancy and has Atypical squamous cell changes of undetermined significance (ASCUS) on cervical cytology with positive high risk human papilloma virus (HPV); CIN I (cervical intraepithelial neoplasia I); Supervision of high-risk pregnancy; Chronic hypertension during pregnancy, antepartum; History of recurrent UTIs; Marijuana use; Substance abuse affecting pregnancy in first trimester, antepartum; High blood hemoglobin F (HCC); Pyelonephritis affecting pregnancy in third trimester; Gestational diabetes mellitus, antepartum; and Pregnancy complicated by subutex maintenance, antepartum (HCC) on her problem list.  Patient reports no complaints.   Contractions: Irregular. Vag. Bleeding: None.  Movement: Present. Denies leaking of fluid.   The following portions of the patient's history were reviewed and updated as appropriate: allergies, current medications, past family history, past medical history, past social history, past surgical history and problem list. Problem list updated.  Objective:   Vitals:   02/03/17 0903 02/03/17 0930  BP: (!) 125/96 123/84  Pulse: 94   Weight: 184 lb 4.8 oz (83.6 kg)     Fetal Status: Fetal Heart Rate (bpm): NST   Movement: Present     General:  Alert, oriented and cooperative. Patient is in no acute distress.  Skin: Skin is warm and dry. No rash noted.   Cardiovascular: Normal heart rate noted  Respiratory: Normal respiratory effort, no problems with respiration noted  Abdomen: Soft, gravid, appropriate for gestational age. Pain/Pressure: Present     Pelvic:  Cervical exam deferred        Extremities: Normal range of motion.     Mental Status: Normal mood and affect. Normal behavior. Normal judgment  and thought content.   Urinalysis:      Assessment and Plan:  Pregnancy: [id number] at 5722w0d  1. Chronic hypertension during pregnancy, antepartum On no meds. No issues. Normal growth scan on 4/29. NR NST today (normal baseline, no decels, mod variabilty, negative toco) and d/w pt need for BPP today.  pt no showed to BPP on 5/9 - Fetal nonstress test - US MFM FETAL BPP WO NON STRESS; Future  2. Supervision of high risk pregnancy in third trimester Routine care. Undecided on BTL .   3. Diet controlled gestational diabetes mellitus (GDM), antepartum On no meds. Didn't bring log but gives normal values  4. Pyelonephritis affecting pregnancy in third trimester On macrobid suppression  5. Pregnancy complicated by subutex maintenance, antepartum (HCC) On [facility] 8[facility] and rarely takes oxycodone (last used yesterday) for PRN usage. Patient alone. Do not discuss when anyone else is in the room. She states she's been on subutex for less than a month but was on oxycodone 5mg  q4-6h PRN (?20mg  per day) prior to being on subutes. D/w her that newborn may have to stay to monitor for NAAS and to d/w peds tomorrow  6. Atypical squamous cell changes of undetermined significance (ASCUS) on cervical cytology with positive high risk human papilloma virus (HPV) Needs colpo PP  7. h/o prior c-section  For repeat tomorrow if normal BPP today.   Term labor symptoms and general obstetric precautions including but not limited to vaginal bleeding, contractions, leaking of fluid and fetal movement were reviewed in detail with the patient. Please refer to After Visit Summary for other counseling recommendations.  Return for PP visit will be @ [facility].   Anthonee Gelin,  Eduard Clos, MD

## 2017-02-03 NOTE — MAU Provider Note (Signed)
MAU HISTORY AND PHYSICAL  Chief Complaint:  NRNST   Lisa Vazquez is a 31 y.o.  [phone]  at [redacted]w[redacted]d presenting for NRNST  Pt presented to clinic today for NST, which was read as non-reactive. Sent for bpp, 6/8 for gross body movement. Pt reports positive fetal movements, no lof or bleeding, no contractions. Has c/s scheduled tomorrow morning.     Past Medical History:  Diagnosis Date  . BV (bacterial vaginosis)   . Gestational diabetes   . Hypertension   . Kidney infection   . UTI (lower urinary tract infection)     Past Surgical History:  Procedure Laterality Date  . CESAREAN SECTION     x3    Family History  Problem Relation Age of Onset  . Hypertension Mother   . Heart disease Father   . Asthma Brother     Social History  Substance Use Topics  . Smoking status: Current Some Day Smoker    Packs/day: 0.25    Years: 7.00    Types: Cigarettes  . Smokeless tobacco: Former Neurosurgeon     Comment: 3 cigs/day, has used vape pens  . Alcohol use No    No Known Allergies  Prescriptions Prior to Admission  Medication Sig Dispense Refill Last Dose  . acetaminophen (TYLENOL) 500 MG tablet Take 1,000 mg by mouth every 6 (six) hours as needed for mild pain or headache.   Taking  . aspirin EC 81 MG tablet Take 1 tablet (81 mg total) by mouth daily. Take after 12 weeks for prevention of preeclampssia later in pregnancy (Patient not taking: Reported on 02/03/2017) 300 tablet 2 Not Taking  . buprenorphine (SUBUTEX) 8 MG SUBL SL tablet Place 8 mg under the tongue daily.    Not Taking  . nitrofurantoin, macrocrystal-monohydrate, (MACROBID) 100 MG capsule Take 100 mg by mouth at bedtime.   Taking  . oxyCODONE-acetaminophen (PERCOCET/ROXICET) 5-325 MG tablet Take 1 tablet by mouth every 4 (four) hours as needed for moderate pain or severe pain.   Taking  . Prenat w/o A Vit-FeFum-FePo-FA (CONCEPT OB) 130-92.4-1 MG CAPS Take 1 tablet by mouth daily. 30 capsule 12 Taking    Review of  Systems - Negative except for what is mentioned in HPI.  Physical Exam  Blood pressure 133/84, pulse 76, temperature 98.7 F (37.1 C), temperature source Oral, resp. rate 18, height 5\' 4"  (1.626 m), weight 184 lb (83.5 kg), last menstrual period 05/10/2016. GENERAL: Well-developed, well-nourished female in no acute distress.  LUNGS: Clear to auscultation bilaterally.  HEART: Regular rate and rhythm. ABDOMEN: Soft, nontender, nondistended, .  EXTREMITIES: Nontender, no edema, 2+ distal pulses. GU: deferred FHT:  125/mod/+a/-d Contractions: none   Labs: Results for orders placed or performed during the hospital encounter of 02/03/17 (from the past 24 hour(s))  CBC   Collection Time: 02/03/17 10:30 AM  Result Value Ref Range   WBC 8.0 4.0 - 10.5 K/uL   RBC 4.59 3.87 - 5.11 MIL/uL   Hemoglobin 11.3 (L) 12.0 - 15.0 g/dL   HCT 45.4 (L) 09.8 - 11.9 %   MCV 76.0 (L) 78.0 - 100.0 fL   MCH 24.6 (L) 26.0 - 34.0 pg   MCHC 32.4 30.0 - 36.0 g/dL   RDW 14.7 (H) 82.9 - 56.2 %   Platelets 208 150 - 400 K/uL  Type and screen   Collection Time: 02/03/17 10:30 AM  Result Value Ref Range   ABO/RH(D) A POS    Antibody Screen NEG  Sample Expiration 02/06/2017     Imaging Studies:  [place] Mfm Fetal Bpp Wo Non Stress  Result Date: 02/03/2017 ----------------------------------------------------------------------  OBSTETRICS REPORT                      (Signed Final 02/03/2017 11:49 am) ---------------------------------------------------------------------- Patient Info  ID #:       [id number]                          D.O.B.:  [birth year] (30 yrs)  Name:       Lisa Vazquez [facility]            Visit Date: 02/03/2017 10:57 am ---------------------------------------------------------------------- Performed By  Performed By:     Tomma Lightning             Ref. Address:     [address]                                                              [place], [place]                                                             [address]  Attending:        Charlsie Merles MD         Location:         [facility]  Referred By:      Catalina Antigua MD ---------------------------------------------------------------------- Orders   #  Description                                 Code   1  [place] MFM FETAL BPP WO NON STRESS              19147.82  ----------------------------------------------------------------------   #  Ordered By               Order #        Accession #    Episode #   1  Manteca Bing          [id number]      [id number]     [id number]  ---------------------------------------------------------------------- Indications   [redacted] weeks gestation of pregnancy                Z3A.39   Gestational diabetes in pregnancy, diet        O24.410   controlled   Poor obstetric history: Previous  preterm       O09.219   delivery, antepartum x 2 (28 & 34 wks)   Hypertension - Chronic/Pre-existing (ASA)      O10.019   Previous cesarean delivery, antepartum         O34.219   Non-reactive NST                               O28.9  ---------------------------------------------------------------------- OB History  Blood Type:            Height:  5'4"   Weight (lb):  164       BMI:  28.15  Gravidity:    5         Term:   1        Prem:   2        SAB:   1  TOP:          0       Ectopic:  0        Living: 3 ---------------------------------------------------------------------- Fetal Evaluation  Num Of Fetuses:     1  Fetal Heart         131  Rate(bpm):  Cardiac Activity:   Observed  Presentation:       Cephalic  Placenta:           Anterior, above cervical os  Amniotic Fluid  AFI FV:      Subjectively within normal limits  AFI Sum(cm)     %Tile       Largest Pocket(cm)  11.01           36          5.49  RUQ(cm)       RLQ(cm)       LUQ(cm)        LLQ(cm)  3.06          1.16          1.3            5.49  ---------------------------------------------------------------------- Biophysical Evaluation  Amniotic F.V:   Within normal limits       F. Tone:        Observed  F. Movement:    Not Observed               Score:          6/8  F. Breathing:   Observed ---------------------------------------------------------------------- Gestational Age  Best:          39w 0d     Det. By:  Previous Ultrasound      EDD:   02/10/17                                      (07/05/16) ---------------------------------------------------------------------- Anatomy  Stomach:               Appears normal, left   Bladder:                Appears normal                         sided  Kidneys:               Appear normal, lt ---------------------------------------------------------------------- Cervix Uterus Adnexa  Cervix  Not visualized (advanced GA >29wks) ---------------------------------------------------------------------- Impression  IUP at 39+0 weeks with [facility]  and a nonreactive NST  Normal amniotic fluid  BPP 6/8 with no gross body movements ---------------------------------------------------------------------- Recommendations  Recommend evaluation with prolonged monitoring in MAU. It  is very unusual to have normal tone and breathing and  decreased gross body movements. If NST becomes reactive  later today, that will be reassuring. A repeat BPP later this  weeks would be prudent ----------------------------------------------------------------------                 Charlsie Merles, MD Electronically Signed Final Report   02/03/2017 11:49 am ----------------------------------------------------------------------  [place] Mfm Fetal Bpp Wo Non Stress  Result Date: 01/18/2017 ----------------------------------------------------------------------  OBSTETRICS REPORT                       (Signed Final 01/18/2017 11:36 pm) ---------------------------------------------------------------------- Patient Info  ID #:       [id number]                           D.O.B.:  11-29-1985 (30 yrs)  Name:       ARLYN BUMPUS [facility]            Visit Date: 01/15/2017 10:44 am ---------------------------------------------------------------------- Performed By  Performed By:     Lestine Mount RDMS      Ref. Address:      [address]                                                              [place], [place]                                                              [address]  Attending:        Particia Nearing MD       Location:          [facility]  Referred By:      Catalina Antigua MD ---------------------------------------------------------------------- Orders   #  Description                                 Code   1  [place] MFM FETAL BPP WO NON STRESS              76819.01   2  [place] MFM OB FOLLOW UP                         19147.82  ----------------------------------------------------------------------   #  Ordered By  Order #        Accession #    Episode #   1  Jaynie Collins           [id number]      [id number]     [id number]   2  Jaynie Collins           [id number]      [id number]     [id number]  ---------------------------------------------------------------------- Indications   [redacted] weeks gestation of pregnancy                Z3A.36   Gestational diabetes in pregnancy, diet        O24.410   controlled   Poor obstetric history: Previous preterm       O09.219   delivery, antepartum x 2 (28 & 34 wks)   Hypertension - Chronic/Pre-existing (ASA)      O10.019   Previous cesarean delivery, antepartum         O34.219  ---------------------------------------------------------------------- OB History  Blood Type:            Height:  5'4"   Weight (lb):  164       BMI:  28.15  Gravidity:    5         Term:   1        Prem:   2         SAB:   1  TOP:          0       Ectopic:  0        Living: 3 ---------------------------------------------------------------------- Fetal Evaluation  Num Of Fetuses:      1  Fetal Heart         140  Rate(bpm):  Cardiac Activity:   Observed  Presentation:       Cephalic  Placenta:           Anterior, above cervical os  Amniotic Fluid  AFI FV:      Subjectively within normal limits  AFI Sum(cm)     %Tile       Largest Pocket(cm)  11.27           32          4.58  RUQ(cm)       RLQ(cm)       LUQ(cm)        LLQ(cm)  4.58          1.81          1.94           2.94 ---------------------------------------------------------------------- Biophysical Evaluation  Amniotic F.V:   Within normal limits       F. Tone:         Observed  F. Movement:    Observed                   Score:           8/8  F. Breathing:   Observed ---------------------------------------------------------------------- Biometry  BPD:      85.2  mm     G. Age:  34w 2d         11  %    CI:         72.21  %    70 - 86  FL/HC:       20.6  %    20.1 - 22.1  HC:        319  mm     G. Age:  35w 6d         16  %    HC/AC:       1.01       0.93 - 1.11  AC:      315.8  mm     G. Age:  35w 3d         39  %    FL/BPD:      77.1  %    71 - 87  FL:       65.7  mm     G. Age:  33w 6d          4  %    FL/AC:       20.8  %    20 - 24  HUM:      60.7  mm     G. Age:  35w 1d         44  %  Est. FW:    2566   gm    5 lb 11 oz     33  % ---------------------------------------------------------------------- Gestational Age  U/S Today:     34w 6d                                        EDD:    02/20/17  Best:          36w 2d     Det. By:  Previous Ultrasound      EDD:    02/10/17                                      (07/05/16) ---------------------------------------------------------------------- Anatomy  Cranium:               Appears normal         Aortic Arch:            Previously seen  Cavum:                 Previously seen        Ductal Arch:            Previously seen  Ventricles:            Appears normal         Diaphragm:              Previously seen  Choroid Plexus:         Previously seen        Stomach:                Appears normal, left                                                                        sided  Cerebellum:            Previously seen  Abdomen:                Appears normal  Posterior Fossa:       Previously seen        Abdominal Wall:         Previously seen  Nuchal Fold:           Previously seen        Cord Vessels:           Previously seen  Face:                  Orbits and profile     Kidneys:                Appear normal                         previously seen  Lips:                  Previously seen        Bladder:                Appears normal  Thoracic:              Appears normal         Spine:                  Previously seen  Heart:                 Appears normal         Upper Extremities:      Previously seen                         (4CH, axis, and situs  RVOT:                  Previously seen        Lower Extremities:      Previously seen  LVOT:                  Previously seen  Other:  Appears to be female Both heels and 5th digits preiously seen ---------------------------------------------------------------------- Impression  SIUP at 36+2 weeks  Normal interval anatomy; anatomic survey complete  Normal amniotic fluid volume  Appropriate interval growth with EFW at the 33rd %tile  BPP 8/8 ---------------------------------------------------------------------- Recommendations  Continue twice weekly NSTs with weekly AFIs or weekly BPPs ----------------------------------------------------------------------                 Particia Nearing, MD Electronically Signed Final Report   01/18/2017 11:36 pm ----------------------------------------------------------------------  [place] Mfm Fetal Bpp Wo Non Stress  Result Date: 01/06/2017 ----------------------------------------------------------------------  OBSTETRICS REPORT                      (Signed Final 01/06/2017 01:49 pm) ---------------------------------------------------------------------- Patient  Info  ID #:       [id number]                          D.O.B.:  15-Apr-1986 (30 yrs)  Name:       NAYARA TAPLIN [facility]            Visit Date: 01/06/2017 01:41 pm ---------------------------------------------------------------------- Performed By  Performed By:     Vivien Rota        Ref. Address:     [address]  RDMS                                                             [address]                                                             [place], [place]                                                             [address]  Attending:        Charlsie Merles MD         Location:         [facility]  Referred By:      Catalina Antigua MD ---------------------------------------------------------------------- Orders   #  Description                                 Code   1  [place] MFM FETAL BPP WO NON STRESS              76819.01  ----------------------------------------------------------------------   #  Ordered By               Order #        Accession #    Episode #   1  Judeth Horn            [id number]      [id number]     [id number]  ---------------------------------------------------------------------- Indications   [redacted] weeks gestation of pregnancy                Z3A.35   Gestational diabetes in pregnancy, diet        O24.410   controlled   Poor obstetric history: Previous preterm       O09.219   delivery, antepartum x 2 (28 & 34 wks)   Hypertension - Chronic/Pre-existing (ASA)      O10.019   Previous cesarean delivery, antepartum         O34.219   Non-reactive NST                               O28.9  ---------------------------------------------------------------------- OB History  Blood Type:            Height:  5'4"   Weight (lb):  164       BMI:  28.15  Gravidity:    5         Term:   1        Prem:   2        SAB:   1  TOP:          0  Ectopic:  0        Living: 3 ---------------------------------------------------------------------- Fetal Evaluation  Num Of Fetuses:     1   Fetal Heart         135  Rate(bpm):  Cardiac Activity:   Observed  Presentation:       Cephalic  Amniotic Fluid  AFI FV:      Subjectively within normal limits  AFI Sum(cm)     %Tile       Largest Pocket(cm)  15.26           55          5.36  RUQ(cm)       RLQ(cm)       LUQ(cm)        LLQ(cm)  2.73          5.36          2.61           4.56 ---------------------------------------------------------------------- Biophysical Evaluation  Amniotic F.V:   Pocket => 2 cm two         F. Tone:        Observed                  planes  F. Movement:    Observed                   Score:          6/8  F. Breathing:   Not Observed ---------------------------------------------------------------------- Gestational Age  Best:          35w 0d     Det. By:  Previous Ultrasound      EDD:   02/10/17                                      (07/05/16) ---------------------------------------------------------------------- Impression  Single IUP at 35+0 weks with chronic hypertension,  gestational diabetes and nonreactive NST. Here for BPP  BPP 8/8  Normal amniotic fluid volume ---------------------------------------------------------------------- Recommendations  Follow-up ultrasounds as clinically indicated. ----------------------------------------------------------------------                 Charlsie Merles, MD Electronically Signed Final Report   01/06/2017 01:49 pm ----------------------------------------------------------------------  [place] Mfm Ob Follow Up  Result Date: 01/18/2017 ----------------------------------------------------------------------  OBSTETRICS REPORT                       (Signed Final 01/18/2017 11:36 pm) ---------------------------------------------------------------------- Patient Info  ID #:       [id number]                          D.O.B.:  07/18/1986 (30 yrs)  Name:       AUSTINE KELSAY [facility]            Visit Date: 01/15/2017 10:44 am ---------------------------------------------------------------------- Performed By   Performed By:     Lestine Mount RDMS      Ref. Address:      [address]  [place], [place]                                                              [address]  Attending:        Particia Nearing MD       Location:          [facility]  Referred By:      Catalina Antigua MD ---------------------------------------------------------------------- Orders   #  Description                                 Code   1  [place] MFM FETAL BPP WO NON STRESS              76819.01   2  [place] MFM OB FOLLOW UP                         52841.32  ----------------------------------------------------------------------   #  Ordered By               Order #        Accession #    Episode #   1  Jaynie Collins           [id number]      [id number]     [id number]   2  Jaynie Collins           [id number]      [id number]     [id number]  ---------------------------------------------------------------------- Indications   [redacted] weeks gestation of pregnancy                Z3A.36   Gestational diabetes in pregnancy, diet        O24.410   controlled   Poor obstetric history: Previous preterm       O09.219   delivery, antepartum x 2 (28 & 34 wks)   Hypertension - Chronic/Pre-existing (ASA)      O10.019   Previous cesarean delivery, antepartum         O34.219  ---------------------------------------------------------------------- OB History  Blood Type:            Height:  5'4"   Weight (lb):  164       BMI:  28.15  Gravidity:    5         Term:   1        Prem:   2         SAB:   1  TOP:          0       Ectopic:  0        Living: 3 ---------------------------------------------------------------------- Fetal Evaluation  Num Of Fetuses:     1  Fetal Heart         140  Rate(bpm):  Cardiac Activity:   Observed  Presentation:       Cephalic  Placenta:           Anterior, above cervical os  Amniotic Fluid  AFI FV:       Subjectively within normal limits  AFI Sum(cm)     %Tile  Largest Pocket(cm)  11.27           32          4.58  RUQ(cm)       RLQ(cm)       LUQ(cm)        LLQ(cm)  4.58          1.81          1.94           2.94 ---------------------------------------------------------------------- Biophysical Evaluation  Amniotic F.V:   Within normal limits       F. Tone:         Observed  F. Movement:    Observed                   Score:           8/8  F. Breathing:   Observed ---------------------------------------------------------------------- Biometry  BPD:      85.2  mm     G. Age:  34w 2d         11  %    CI:         72.21  %    70 - 86                                                          FL/HC:       20.6  %    20.1 - 22.1  HC:        319  mm     G. Age:  35w 6d         16  %    HC/AC:       1.01       0.93 - 1.11  AC:      315.8  mm     G. Age:  35w 3d         39  %    FL/BPD:      77.1  %    71 - 87  FL:       65.7  mm     G. Age:  33w 6d          4  %    FL/AC:       20.8  %    20 - 24  HUM:      60.7  mm     G. Age:  35w 1d         44  %  Est. FW:    2566   gm    5 lb 11 oz     33  % ---------------------------------------------------------------------- Gestational Age  U/S Today:     34w 6d                                        EDD:    02/20/17  Best:          36w 2d     Det. By:  Previous Ultrasound      EDD:    02/10/17                                      (  07/05/16) ---------------------------------------------------------------------- Anatomy  Cranium:               Appears normal         Aortic Arch:            Previously seen  Cavum:                 Previously seen        Ductal Arch:            Previously seen  Ventricles:            Appears normal         Diaphragm:              Previously seen  Choroid Plexus:        Previously seen        Stomach:                Appears normal, left                                                                        sided  Cerebellum:            Previously seen         Abdomen:                Appears normal  Posterior Fossa:       Previously seen        Abdominal Wall:         Previously seen  Nuchal Fold:           Previously seen        Cord Vessels:           Previously seen  Face:                  Orbits and profile     Kidneys:                Appear normal                         previously seen  Lips:                  Previously seen        Bladder:                Appears normal  Thoracic:              Appears normal         Spine:                  Previously seen  Heart:                 Appears normal         Upper Extremities:      Previously seen                         (4CH, axis, and situs  RVOT:                  Previously seen        Lower Extremities:  Previously seen  LVOT:                  Previously seen  Other:  Appears to be female Both heels and 5th digits preiously seen ---------------------------------------------------------------------- Impression  SIUP at 36+2 weeks  Normal interval anatomy; anatomic survey complete  Normal amniotic fluid volume  Appropriate interval growth with EFW at the 33rd %tile  BPP 8/8 ---------------------------------------------------------------------- Recommendations  Continue twice weekly NSTs with weekly AFIs or weekly BPPs ----------------------------------------------------------------------                 Particia Nearing, MD Electronically Signed Final Report   01/18/2017 11:36 pm ----------------------------------------------------------------------   Assessment/Plan: CYRENA KUCHENBECKER is  31 y.o. [id number] at [redacted]w[redacted]d presents with 6/8 bpp. Here monitored and NST reactive. Discussed w/ Dr. Jolayne Panther, plan to proceed w/ scheduled c/s tomorrow. Counseled to perform kick count this evening.   Cherrie Gauze Ulysse Siemen 5/15/20181:25 PM

## 2017-02-03 NOTE — Progress Notes (Signed)
Pt denies H/A or visual disturbances. She reports having a painful hemorrhoid.  Rpt C/S scheduled tomorrow. Pt taken to MFM for BPP today.

## 2017-02-04 ENCOUNTER — Inpatient Hospital Stay (HOSPITAL_COMMUNITY)
Admission: RE | Admit: 2017-02-04 | Discharge: 2017-02-07 | DRG: 765 | Disposition: A | Discharge status: Home / Self Care | Payer: Medicaid Other | Source: Ambulatory Visit | Attending: Obstetrics and Gynecology | Admitting: Obstetrics and Gynecology

## 2017-02-04 ENCOUNTER — Encounter (HOSPITAL_COMMUNITY)
Admission: RE | Disposition: A | Discharge status: Home / Self Care | Payer: Self-pay | Source: Ambulatory Visit | Attending: Obstetrics and Gynecology

## 2017-02-04 ENCOUNTER — Encounter (HOSPITAL_COMMUNITY): Payer: Self-pay | Admitting: *Deleted

## 2017-02-04 ENCOUNTER — Inpatient Hospital Stay (HOSPITAL_COMMUNITY): Payer: Medicaid Other | Admitting: Certified Registered Nurse Anesthetist

## 2017-02-04 DIAGNOSIS — O34211 Maternal care for low transverse scar from previous cesarean delivery: Principal | ICD-10-CM | POA: Diagnosis present

## 2017-02-04 DIAGNOSIS — O1002 Pre-existing essential hypertension complicating childbirth: Secondary | ICD-10-CM | POA: Diagnosis present

## 2017-02-04 DIAGNOSIS — O99334 Smoking (tobacco) complicating childbirth: Secondary | ICD-10-CM | POA: Diagnosis present

## 2017-02-04 DIAGNOSIS — Z98891 History of uterine scar from previous surgery: Secondary | ICD-10-CM

## 2017-02-04 DIAGNOSIS — F17210 Nicotine dependence, cigarettes, uncomplicated: Secondary | ICD-10-CM | POA: Diagnosis present

## 2017-02-04 DIAGNOSIS — Z87440 Personal history of urinary (tract) infections: Secondary | ICD-10-CM

## 2017-02-04 DIAGNOSIS — O2303 Infections of kidney in pregnancy, third trimester: Secondary | ICD-10-CM

## 2017-02-04 DIAGNOSIS — O9989 Other specified diseases and conditions complicating pregnancy, childbirth and the puerperium: Secondary | ICD-10-CM | POA: Diagnosis not present

## 2017-02-04 DIAGNOSIS — N870 Mild cervical dysplasia: Secondary | ICD-10-CM

## 2017-02-04 DIAGNOSIS — D582 Other hemoglobinopathies: Secondary | ICD-10-CM

## 2017-02-04 DIAGNOSIS — Z3A39 39 weeks gestation of pregnancy: Secondary | ICD-10-CM

## 2017-02-04 DIAGNOSIS — F1290 Cannabis use, unspecified, uncomplicated: Secondary | ICD-10-CM

## 2017-02-04 DIAGNOSIS — F1120 Opioid dependence, uncomplicated: Secondary | ICD-10-CM

## 2017-02-04 DIAGNOSIS — O24420 Gestational diabetes mellitus in childbirth, diet controlled: Secondary | ICD-10-CM | POA: Diagnosis present

## 2017-02-04 DIAGNOSIS — O0993 Supervision of high risk pregnancy, unspecified, third trimester: Secondary | ICD-10-CM

## 2017-02-04 DIAGNOSIS — O24429 Gestational diabetes mellitus in childbirth, unspecified control: Secondary | ICD-10-CM

## 2017-02-04 DIAGNOSIS — O99321 Drug use complicating pregnancy, first trimester: Secondary | ICD-10-CM

## 2017-02-04 DIAGNOSIS — O10919 Unspecified pre-existing hypertension complicating pregnancy, unspecified trimester: Secondary | ICD-10-CM

## 2017-02-04 DIAGNOSIS — O24410 Gestational diabetes mellitus in pregnancy, diet controlled: Secondary | ICD-10-CM

## 2017-02-04 DIAGNOSIS — R87810 Cervical high risk human papillomavirus (HPV) DNA test positive: Secondary | ICD-10-CM

## 2017-02-04 DIAGNOSIS — R87610 Atypical squamous cells of undetermined significance on cytologic smear of cervix (ASC-US): Secondary | ICD-10-CM

## 2017-02-04 DIAGNOSIS — O99324 Drug use complicating childbirth: Secondary | ICD-10-CM | POA: Diagnosis present

## 2017-02-04 DIAGNOSIS — O99320 Drug use complicating pregnancy, unspecified trimester: Secondary | ICD-10-CM

## 2017-02-04 DIAGNOSIS — O1092 Unspecified pre-existing hypertension complicating childbirth: Secondary | ICD-10-CM | POA: Diagnosis not present

## 2017-02-04 LAB — RAPID URINE DRUG SCREEN, HOSP PERFORMED
AMPHETAMINES: NOT DETECTED
Barbiturates: NOT DETECTED
Benzodiazepines: POSITIVE — AB
Cocaine: NOT DETECTED
OPIATES: NOT DETECTED
Tetrahydrocannabinol: NOT DETECTED

## 2017-02-04 LAB — GLUCOSE, CAPILLARY: GLUCOSE-CAPILLARY: 70 mg/dL (ref 65–99)

## 2017-02-04 LAB — RPR: RPR: NONREACTIVE

## 2017-02-04 SURGERY — Surgical Case
Anesthesia: Spinal | Site: Abdomen | Laterality: Bilateral

## 2017-02-04 MED ORDER — NALBUPHINE HCL 10 MG/ML IJ SOLN: 5.0000 mg | INTRAMUSCULAR | Status: DC | PRN

## 2017-02-04 MED ORDER — DIPHENHYDRAMINE HCL 50 MG/ML IJ SOLN: 12.5000 mg | INTRAMUSCULAR | Status: DC | PRN

## 2017-02-04 MED ORDER — KETOROLAC TROMETHAMINE 30 MG/ML IJ SOLN
30.0000 mg | Freq: Once | INTRAMUSCULAR | Status: DC | PRN
  Administered 2017-02-04: 30 mg via INTRAVENOUS

## 2017-02-04 MED ORDER — HYDROMORPHONE HCL 1 MG/ML IJ SOLN
0.2500 mg | INTRAMUSCULAR | Status: DC | PRN
  Administered 2017-02-04: 0.25 mg via INTRAVENOUS
  Administered 2017-02-04: 0.5 mg via INTRAVENOUS

## 2017-02-04 MED ORDER — CEFAZOLIN SODIUM-DEXTROSE 2-4 GM/100ML-% IV SOLN
2.0000 g | INTRAVENOUS | Status: AC
  Administered 2017-02-04: 2 g via INTRAVENOUS
  Filled 2017-02-04: qty 100

## 2017-02-04 MED ORDER — MIDAZOLAM HCL 2 MG/2ML IJ SOLN
INTRAMUSCULAR | Status: DC | PRN
  Administered 2017-02-04: 2 mg via INTRAVENOUS

## 2017-02-04 MED ORDER — LACTATED RINGERS IV SOLN
INTRAVENOUS | Status: DC
  Administered 2017-02-04 – 2017-02-05 (×2): via INTRAVENOUS

## 2017-02-04 MED ORDER — OXYCODONE HCL 5 MG PO TABS: 5.0000 mg | ORAL_TABLET | Freq: Once | ORAL | Status: DC | PRN

## 2017-02-04 MED ORDER — CEFAZOLIN SODIUM-DEXTROSE 2-4 GM/100ML-% IV SOLN
INTRAVENOUS | Status: AC
  Filled 2017-02-04: qty 100

## 2017-02-04 MED ORDER — KETOROLAC TROMETHAMINE 30 MG/ML IJ SOLN
INTRAMUSCULAR | Status: AC
  Filled 2017-02-04: qty 1

## 2017-02-04 MED ORDER — BUPRENORPHINE HCL-NALOXONE HCL 8-2 MG SL SUBL: 1.0000 | SUBLINGUAL_TABLET | Freq: Two times a day (BID) | SUBLINGUAL | Status: DC

## 2017-02-04 MED ORDER — NALBUPHINE HCL 10 MG/ML IJ SOLN: 5.0000 mg | Freq: Once | INTRAMUSCULAR | Status: DC | PRN

## 2017-02-04 MED ORDER — DIBUCAINE 1 % RE OINT
1.0000 "application " | TOPICAL_OINTMENT | RECTAL | Status: DC | PRN
  Administered 2017-02-05: 1 via RECTAL
  Filled 2017-02-04: qty 28

## 2017-02-04 MED ORDER — BUPIVACAINE IN DEXTROSE 0.75-8.25 % IT SOLN
INTRATHECAL | Status: DC | PRN
  Administered 2017-02-04: 1.6 mL via INTRATHECAL

## 2017-02-04 MED ORDER — KETOROLAC TROMETHAMINE 30 MG/ML IJ SOLN: 30.0000 mg | Freq: Four times a day (QID) | INTRAMUSCULAR | Status: AC | PRN

## 2017-02-04 MED ORDER — COCONUT OIL OIL: 1.0000 "application " | TOPICAL_OIL | Status: DC | PRN

## 2017-02-04 MED ORDER — SCOPOLAMINE 1 MG/3DAYS TD PT72: 1.0000 | MEDICATED_PATCH | Freq: Once | TRANSDERMAL | Status: DC

## 2017-02-04 MED ORDER — LACTATED RINGERS IV SOLN
INTRAVENOUS | Status: DC
  Administered 2017-02-04: 13:00:00 via INTRAVENOUS

## 2017-02-04 MED ORDER — NALOXONE HCL 0.4 MG/ML IJ SOLN: 0.4000 mg | INTRAMUSCULAR | Status: DC | PRN

## 2017-02-04 MED ORDER — POLYETHYLENE GLYCOL 3350 17 G PO PACK
17.0000 g | PACK | Freq: Every day | ORAL | Status: DC
  Administered 2017-02-05 – 2017-02-07 (×3): 17 g via ORAL
  Filled 2017-02-04 (×4): qty 1

## 2017-02-04 MED ORDER — HYDROMORPHONE HCL 1 MG/ML IJ SOLN
INTRAMUSCULAR | Status: AC
  Filled 2017-02-04: qty 1

## 2017-02-04 MED ORDER — ZOLPIDEM TARTRATE 5 MG PO TABS: 5.0000 mg | ORAL_TABLET | Freq: Every evening | ORAL | Status: DC | PRN

## 2017-02-04 MED ORDER — ONDANSETRON HCL 4 MG/2ML IJ SOLN
INTRAMUSCULAR | Status: AC
  Filled 2017-02-04: qty 2

## 2017-02-04 MED ORDER — SENNOSIDES-DOCUSATE SODIUM 8.6-50 MG PO TABS: 1.0000 | ORAL_TABLET | Freq: Every evening | ORAL | Status: DC | PRN

## 2017-02-04 MED ORDER — MORPHINE SULFATE (PF) 0.5 MG/ML IJ SOLN
INTRAMUSCULAR | Status: DC | PRN
  Administered 2017-02-04: .2 mg via INTRATHECAL

## 2017-02-04 MED ORDER — PHENYLEPHRINE 8 MG IN D5W 100 ML (0.08MG/ML) PREMIX OPTIME
INJECTION | INTRAVENOUS | Status: DC | PRN
Start: 2017-02-04 — End: 2017-02-05
  Administered 2017-02-04: 60 ug/min via INTRAVENOUS

## 2017-02-04 MED ORDER — MIDAZOLAM HCL 2 MG/2ML IJ SOLN
INTRAMUSCULAR | Status: AC
  Filled 2017-02-04: qty 2

## 2017-02-04 MED ORDER — PHENYLEPHRINE 8 MG IN D5W 100 ML (0.08MG/ML) PREMIX OPTIME
INJECTION | INTRAVENOUS | Status: AC
  Filled 2017-02-04: qty 100

## 2017-02-04 MED ORDER — ONDANSETRON HCL 4 MG/2ML IJ SOLN: 4.0000 mg | Freq: Three times a day (TID) | INTRAMUSCULAR | Status: DC | PRN

## 2017-02-04 MED ORDER — NITROFURANTOIN MONOHYD MACRO 100 MG PO CAPS
100.0000 mg | ORAL_CAPSULE | Freq: Every day | ORAL | Status: DC
  Administered 2017-02-05 – 2017-02-06 (×3): 100 mg via ORAL
  Filled 2017-02-04 (×5): qty 1

## 2017-02-04 MED ORDER — FENTANYL CITRATE (PF) 100 MCG/2ML IJ SOLN
INTRAMUSCULAR | Status: AC
  Filled 2017-02-04: qty 2

## 2017-02-04 MED ORDER — OXYTOCIN 10 UNIT/ML IJ SOLN
INTRAMUSCULAR | Status: AC
  Filled 2017-02-04: qty 4

## 2017-02-04 MED ORDER — MORPHINE SULFATE (PF) 0.5 MG/ML IJ SOLN
INTRAMUSCULAR | Status: AC
  Filled 2017-02-04: qty 10

## 2017-02-04 MED ORDER — BUPIVACAINE IN DEXTROSE 0.75-8.25 % IT SOLN
INTRATHECAL | Status: AC
  Filled 2017-02-04: qty 2

## 2017-02-04 MED ORDER — DIPHENHYDRAMINE HCL 25 MG PO CAPS: 25.0000 mg | ORAL_CAPSULE | Freq: Four times a day (QID) | ORAL | Status: DC | PRN

## 2017-02-04 MED ORDER — SIMETHICONE 80 MG PO CHEW
80.0000 mg | CHEWABLE_TABLET | Freq: Three times a day (TID) | ORAL | Status: DC
  Administered 2017-02-05 – 2017-02-07 (×7): 80 mg via ORAL
  Filled 2017-02-04 (×8): qty 1

## 2017-02-04 MED ORDER — FENTANYL CITRATE (PF) 100 MCG/2ML IJ SOLN
INTRAMUSCULAR | Status: DC | PRN
  Administered 2017-02-04: 90 ug via INTRAVENOUS
  Administered 2017-02-04: 10 ug via INTRATHECAL

## 2017-02-04 MED ORDER — IBUPROFEN 600 MG PO TABS
600.0000 mg | ORAL_TABLET | Freq: Four times a day (QID) | ORAL | Status: DC | PRN
  Administered 2017-02-05 – 2017-02-07 (×8): 600 mg via ORAL
  Filled 2017-02-04 (×9): qty 1

## 2017-02-04 MED ORDER — OXYCODONE HCL 5 MG/5ML PO SOLN: 5.0000 mg | Freq: Once | ORAL | Status: DC | PRN

## 2017-02-04 MED ORDER — OXYTOCIN 40 UNITS IN LACTATED RINGERS INFUSION - SIMPLE MED: 2.5000 [IU]/h | INTRAVENOUS | Status: AC

## 2017-02-04 MED ORDER — DIPHENHYDRAMINE HCL 25 MG PO CAPS: 25.0000 mg | ORAL_CAPSULE | ORAL | Status: DC | PRN

## 2017-02-04 MED ORDER — WITCH HAZEL-GLYCERIN EX PADS
1.0000 "application " | MEDICATED_PAD | CUTANEOUS | Status: DC | PRN
  Administered 2017-02-05: 1 via TOPICAL

## 2017-02-04 MED ORDER — OXYCODONE HCL 5 MG PO TABS
5.0000 mg | ORAL_TABLET | ORAL | Status: DC | PRN
  Administered 2017-02-05: 5 mg via ORAL
  Filled 2017-02-04: qty 1

## 2017-02-04 MED ORDER — TETANUS-DIPHTH-ACELL PERTUSSIS 5-2.5-18.5 LF-MCG/0.5 IM SUSP: 0.5000 mL | Freq: Once | INTRAMUSCULAR | Status: DC

## 2017-02-04 MED ORDER — ACETAMINOPHEN 325 MG PO TABS
650.0000 mg | ORAL_TABLET | ORAL | Status: DC | PRN
  Administered 2017-02-05 – 2017-02-07 (×4): 650 mg via ORAL
  Filled 2017-02-04 (×4): qty 2

## 2017-02-04 MED ORDER — MENTHOL 3 MG MT LOZG: 1.0000 | LOZENGE | OROMUCOSAL | Status: DC | PRN

## 2017-02-04 MED ORDER — ONDANSETRON HCL 4 MG/2ML IJ SOLN
INTRAMUSCULAR | Status: DC | PRN
  Administered 2017-02-04: 4 mg via INTRAVENOUS

## 2017-02-04 MED ORDER — OXYCODONE HCL 5 MG PO TABS
10.0000 mg | ORAL_TABLET | ORAL | Status: DC | PRN
  Administered 2017-02-05 – 2017-02-07 (×10): 10 mg via ORAL
  Filled 2017-02-04 (×10): qty 2

## 2017-02-04 MED ORDER — SOD CITRATE-CITRIC ACID 500-334 MG/5ML PO SOLN
30.0000 mL | ORAL | Status: AC
  Administered 2017-02-04: 30 mL via ORAL
  Filled 2017-02-04: qty 15

## 2017-02-04 MED ORDER — PROMETHAZINE HCL 25 MG/ML IJ SOLN: 6.2500 mg | INTRAMUSCULAR | Status: DC | PRN

## 2017-02-04 MED ORDER — SCOPOLAMINE 1 MG/3DAYS TD PT72
1.0000 | MEDICATED_PATCH | Freq: Once | TRANSDERMAL | Status: DC
  Administered 2017-02-04: 1.5 mg via TRANSDERMAL
  Filled 2017-02-04: qty 1

## 2017-02-04 MED ORDER — NALOXONE HCL 2 MG/2ML IJ SOSY
1.0000 ug/kg/h | PREFILLED_SYRINGE | INTRAVENOUS | Status: DC | PRN
  Filled 2017-02-04: qty 2

## 2017-02-04 MED ORDER — OXYTOCIN 10 UNIT/ML IJ SOLN
INTRAMUSCULAR | Status: DC | PRN
  Administered 2017-02-04: 40 [IU] via INTRAVENOUS

## 2017-02-04 MED ORDER — BUPRENORPHINE HCL 8 MG SL SUBL
8.0000 mg | SUBLINGUAL_TABLET | Freq: Two times a day (BID) | SUBLINGUAL | Status: DC
  Administered 2017-02-05 – 2017-02-06 (×2): 8 mg via SUBLINGUAL
  Filled 2017-02-04 (×2): qty 1

## 2017-02-04 MED ORDER — PRENATAL MULTIVITAMIN CH
1.0000 | ORAL_TABLET | Freq: Every day | ORAL | Status: DC
  Administered 2017-02-05 – 2017-02-06 (×2): 1 via ORAL
  Filled 2017-02-04 (×2): qty 1

## 2017-02-04 MED ORDER — MEPERIDINE HCL 25 MG/ML IJ SOLN: 6.2500 mg | INTRAMUSCULAR | Status: DC | PRN

## 2017-02-04 MED ORDER — SODIUM CHLORIDE 0.9% FLUSH: 3.0000 mL | INTRAVENOUS | Status: DC | PRN

## 2017-02-04 MED ORDER — LACTATED RINGERS IV SOLN
INTRAVENOUS | Status: DC
  Administered 2017-02-04: 10:00:00 via INTRAVENOUS

## 2017-02-04 SURGICAL SUPPLY — 36 items
BENZOIN TINCTURE PRP APPL 2/3 (GAUZE/BANDAGES/DRESSINGS) ×3 IMPLANT
CANISTER SUCT 3000ML PPV (MISCELLANEOUS) ×3 IMPLANT
CHLORAPREP W/TINT 26ML (MISCELLANEOUS) ×3 IMPLANT
CLOSURE STERI STRIP 1/2 X4 (GAUZE/BANDAGES/DRESSINGS) ×3 IMPLANT
DERMABOND ADVANCED (GAUZE/BANDAGES/DRESSINGS) ×2
DERMABOND ADVANCED .7 DNX12 (GAUZE/BANDAGES/DRESSINGS) ×1 IMPLANT
DRSG OPSITE POSTOP 4X10 (GAUZE/BANDAGES/DRESSINGS) ×3 IMPLANT
ELECT REM PT RETURN 9FT ADLT (ELECTROSURGICAL) ×3
ELECTRODE REM PT RTRN 9FT ADLT (ELECTROSURGICAL) ×1 IMPLANT
GLOVE BIOGEL PI IND STRL 7.0 (GLOVE) ×2 IMPLANT
GLOVE BIOGEL PI INDICATOR 7.0 (GLOVE) ×4
GLOVE INDICATOR 7.5 STRL GRN (GLOVE) ×3 IMPLANT
GLOVE SKINSENSE NS SZ7.0 (GLOVE) ×2
GLOVE SKINSENSE STRL SZ7.0 (GLOVE) ×1 IMPLANT
GOWN STRL REUS W/ TWL LRG LVL3 (GOWN DISPOSABLE) ×2 IMPLANT
GOWN STRL REUS W/ TWL XL LVL3 (GOWN DISPOSABLE) ×1 IMPLANT
GOWN STRL REUS W/TWL LRG LVL3 (GOWN DISPOSABLE) ×4
GOWN STRL REUS W/TWL XL LVL3 (GOWN DISPOSABLE) ×2
NS IRRIG 1000ML POUR BTL (IV SOLUTION) ×3 IMPLANT
PACK C SECTION WH (CUSTOM PROCEDURE TRAY) ×3 IMPLANT
PAD ABD 7.5X8 STRL (GAUZE/BANDAGES/DRESSINGS) ×3 IMPLANT
PAD OB MATERNITY 4.3X12.25 (PERSONAL CARE ITEMS) ×3 IMPLANT
PAD PREP 24X48 CUFFED NSTRL (MISCELLANEOUS) ×3 IMPLANT
SUT MNCRL 0 VIOLET CTX 36 (SUTURE) ×1 IMPLANT
SUT MON AB 4-0 PS1 27 (SUTURE) ×3 IMPLANT
SUT MON AB-0 CT1 36 (SUTURE) ×6 IMPLANT
SUT MONOCRYL 0 CTX 36 (SUTURE) ×2
SUT PLAIN 0 NONE (SUTURE) IMPLANT
SUT PLAIN 2 0 (SUTURE) ×2
SUT PLAIN ABS 2-0 CT1 27XMFL (SUTURE) ×1 IMPLANT
SUT VIC AB 0 CT1 36 (SUTURE) ×6 IMPLANT
SUT VIC AB 3-0 CT1 27 (SUTURE) ×2
SUT VIC AB 3-0 CT1 TAPERPNT 27 (SUTURE) ×1 IMPLANT
SUT VICRYL 2 0 18  TIES (SUTURE)
SUT VICRYL 2 0 18 TIES (SUTURE) IMPLANT
TOWEL OR 17X24 6PK STRL BLUE (TOWEL DISPOSABLE) ×6 IMPLANT

## 2017-02-04 NOTE — H&P (Signed)
Obstetrics Admission History & Physical  02/04/2017 - 9:41 AM Primary OBGYN: [facility]  Chief Complaint: scheduled rpt c-section  History of Present Illness  31 y.o. [id number] @ [redacted]w[redacted]d , with the above CC. Pregnancy complicated by: h/o c-section x 3, cHTN (no meds), GDMa1, subutex use, h/o pyelo, h/o THC use, LSIL pap, BMI 32.   Ms. Lisa Vazquez states she's been npo after MN, no decreased FM or labor s/s. She took her AM dose of the subutex today.   Review of Systems:  as noted in the History of Present Illness.  PMHx:  Past Medical History:  Diagnosis Date  . BV (bacterial vaginosis)   . Gestational diabetes   . Hypertension   . Kidney infection   . UTI (lower urinary tract infection)    PSHx:  Past Surgical History:  Procedure Laterality Date  . CESAREAN SECTION     x3   Medications:  Prescriptions Prior to Admission  Medication Sig Dispense Refill Last Dose  . acetaminophen (TYLENOL) 500 MG tablet Take 1,000 mg by mouth every 6 (six) hours as needed for mild pain or headache.   prn  . aspirin EC 81 MG tablet Take 1 tablet (81 mg total) by mouth daily. Take after 12 weeks for prevention of preeclampssia later in pregnancy (Patient not taking: Reported on 02/03/2017) 300 tablet 2 Not Taking at Unknown time  . buprenorphine (SUBUTEX) 8 MG SUBL SL tablet Place 8 mg under the tongue bid.    02/03/2017 at Unknown time  . oxyCODONE-acetaminophen (PERCOCET/ROXICET) 5-325 MG tablet Take 1 tablet by mouth every 4 (four) hours as needed for moderate pain or severe pain.   02/02/2017 at Unknown time  . Prenat w/o A Vit-FeFum-FePo-FA (CONCEPT OB) 130-92.4-1 MG CAPS Take 1 tablet by mouth daily. 30 capsule 12 02/03/2017 at Unknown time  . nitrofurantoin, macrocrystal-monohydrate, (MACROBID) 100 MG capsule Take 100 mg by mouth at bedtime.   02/02/2017 at Unknown time     Allergies: has No Known Allergies. OBHx:  OB History  Gravida Para Term Preterm AB Living  5  3 1 2 1 3   SAB TAB Ectopic Multiple Live Births    1 0 0 3    # Outcome Date GA Lbr Len/2nd Weight Sex Delivery Anes PTL Lv  5 Current           4 Term 09/19/09    M CS-LTranv Spinal  LIV  3 Preterm 04/26/04 [redacted]w[redacted]d   M CS-LTranv Spinal  LIV  2 Preterm 06/30/03 [redacted]w[redacted]d   F CS-LTranv Spinal  LIV  1 TAB                          FHx:  Family History  Problem Relation Age of Onset  . Hypertension Mother   . Heart disease Father   . Asthma Brother    Soc Hx:  Social History   Social History  . Marital status: Single    Spouse name: N/A  . Number of children: N/A  . Years of education: N/A   Occupational History  . Not on file.   Social History Main Topics  . Smoking status: Current Some Day Smoker    Packs/day: 0.25    Years: 7.00    Types: Cigarettes  . Smokeless tobacco: Former Neurosurgeon     Comment: 3 cigs/day, has used vape pens  . Alcohol use No  . Drug use: No  Comment: Subutex  . Sexual activity: Yes    Birth control/ protection: Condom   Other Topics Concern  . Not on file   Social History Narrative  . No narrative on file    Objective  VS pending   General: Well nourished, well developed female in no acute distress.  Skin:  Warm and dry.  Cardiovascular: S1, S2 normal, no murmur, rub or gallop, regular rate and rhythm Respiratory:  Clear to auscultation bilateral. Normal respiratory effort Abdomen: gravid, nttp. Well healed low transverse skin incision Neuro/Psych:  Normal mood and affect.    Labs  A POS RPR neg  Recent Labs Lab 02/03/17 1030  WBC 8.0  HGB 11.3*  HCT 34.9*  PLT 208    Radiology Anterior placenta 4/26 with normal EFW @ 33%, 2600gm, normal AC 5/15: BPP 8/10 (-2 for movements)  Perinatal info  A POS/ Rubella  Immune / Varicella Immune/RPR neg/HIV negative/HepB Surf Ag negative/TDaP:declined /pap 09/2015 LSIL/   Assessment & Plan   31 y.o. [id number] @ 8052w1d pt doing well *Pregnancy: routine care. She does not want a  BTL *Delivery: pt consented for rpt c-section. Dense fascial adhesions noted on 2010 op note. D/w her re: increased surgical risks. *cHTN: no meds. Doing well *GDMA1: CBG ordered for pre op *Subutex: on 8/8 qday. Doesn't want family to know about this. UDS ordered *Pyelo: continue qhs macrobid post op  Cornelia Copaharlie Marcellus Pulliam, Jr. MD Attending Center for [facility](Faculty Practice)

## 2017-02-04 NOTE — Anesthesia Procedure Notes (Signed)
Spinal  Patient location during procedure: OB Start time: 02/04/2017 12:03 PM End time: 02/04/2017 12:18 PM Staffing Anesthesiologist: Anitra LauthMILLER, WARREN RAY Performed: anesthesiologist  Preanesthetic Checklist Completed: patient identified, surgical consent, pre-op evaluation, timeout performed, IV checked, risks and benefits discussed and monitors and equipment checked Spinal Block Patient position: sitting Prep: Betadine and site prepped and draped Patient monitoring: heart rate, cardiac monitor, continuous pulse ox and blood pressure Approach: midline Location: L3-4 Injection technique: single-shot Needle Needle type: Pencan  Needle gauge: 24 G Needle length: 10 cm Assessment Sensory level: T4

## 2017-02-04 NOTE — Anesthesia Preprocedure Evaluation (Signed)
Anesthesia Evaluation  Patient identified by MRN, date of birth, ID band Patient awake    Reviewed: Allergy & Precautions, NPO status , Patient's Chart, lab work & pertinent test results  Airway Mallampati: II  TM Distance: >3 FB Neck ROM: Full    Dental no notable dental hx.    Pulmonary neg pulmonary ROS, Current Smoker,    Pulmonary exam normal breath sounds clear to auscultation       Cardiovascular hypertension, negative cardio ROS Normal cardiovascular exam Rhythm:Regular Rate:Normal     Neuro/Psych negative neurological ROS  negative psych ROS   GI/Hepatic negative GI ROS, Neg liver ROS,   Endo/Other  negative endocrine ROSdiabetes  Renal/GU negative Renal ROS  negative genitourinary   Musculoskeletal negative musculoskeletal ROS (+)   Abdominal   Peds negative pediatric ROS (+)  Hematology negative hematology ROS (+)   Anesthesia Other Findings   Reproductive/Obstetrics negative OB ROS (+) Pregnancy                             Anesthesia Physical Anesthesia Plan  ASA: II  Anesthesia Plan: Spinal   Post-op Pain Management:    Induction:   Airway Management Planned: Natural Airway  Additional Equipment:   Intra-op Plan:   Post-operative Plan:   Informed Consent: I have reviewed the patients History and Physical, chart, labs and discussed the procedure including the risks, benefits and alternatives for the proposed anesthesia with the patient or authorized representative who has indicated his/her understanding and acceptance.   Dental advisory given  Plan Discussed with: CRNA  Anesthesia Plan Comments:         Anesthesia Quick Evaluation

## 2017-02-04 NOTE — Op Note (Signed)
Operative Note   SURGERY DATE: 02/04/2017  PRE-OP DIAGNOSIS:  *Pregnancy @ 39/1 weeks *History of prior cesarean section x 3 *GDMA1 *Chronic hypertension *Current Subutex use and history of THC use  POST-OP DIAGNOSIS: Same. Delivered   PROCEDURE: Repeat low transverse cesarean section via pfannenstiel skin incision with double layer uterine closure  SURGEON: Surgeon(s) and Role:    *  BingPickens, Lai Hendriks, MD - Primary  ASSISTANT:    Lorne Skeens* Schenk, Nicholas Michael, MD - Assisting  ANESTHESIA: spinal  ESTIMATED BLOOD LOSS: 500mL  DRAINS: 300mL UOP via indwelling foley  TOTAL IV FLUIDS: 1500mL crystalloid  VTE PROPHYLAXIS: SCDs to bilateral lower extremities  ANTIBIOTICS: Two grams of Cefazolin were given., within 1 hour of skin incision  SPECIMENS: placenta to pathology  COMPLICATIONS: None  FINDINGS: Surprisingly, no intra-abdominal adhesions were noted. Grossly normal uterus, tubes and ovaries. Clear amniotic fluid, cephalic female infant, weight 2940gm, APGARs 9/9, intact placenta.  PROCEDURE IN DETAIL: The patient was taken to the operating room where anesthesia was administered and normal fetal heart tones were confirmed. She was then prepped and draped in the normal fashion in the dorsal supine position with a leftward tilt.  After a time out was performed, a pfannensteil  skin incision was made with the scalpel and carried through to the underlying layer of fascia. The fascia was then incised at the midline and this incision was extended laterally with the mayo scissors. Attention was turned to the superior aspect of the fascial incision which was grasped with the kocher clamps x 2, tented up and the rectus muscles were dissected off with the scalpel. In a similar fashion the inferior aspect of the fascial incision was grasped with the kocher clamps, tented up and the rectus muscles dissected off with the mayo scissors. The rectus muscles were then separated in the midline and the  peritoneum was entered bluntly. The bladder blade was inserted and the vesicouterine peritoneum was identified, tented up and entered with the metzenbaum scissors. This incision was extended laterally and the bladder flap was created digitally. The bladder blade was reinserted.  A low transverse hysterotomy was made with the scalpel until the endometrial cavity was breached and the amniotic sac ruptured,yielding clear amniotic fluid. This incision was extended bluntly and the infant's head, shoulders and body were delivered atraumatically.The cord was clamped x 2 and cut, and the infant was handed to the awaiting pediatricians, after delayed cord clamping was done.  The placenta was then gradually expressed from the uterus and then the uterus was exteriorized and cleared of all clots and debris. The hysterotomy was repaired with a running suture of 1-0 monocryl. A second imbricating layer of 1-0 monocryl suture was then placed to achieve excellent hemostasis.   The uterus and adnexa were then returned to the abdomen, and the hysterotomy and all operative sites were reinspected and excellent hemostasis was noted after irrigation and suction of the abdomen with warm saline.  The peritoneum was closed with a running stitch of 3-0 Vicryl. The fascia was reapproximated with 0 Vicryl in a simple running fashion bilaterally. The skin was then closed with 4-0 monocryl, in a subcuticular fashion.  The patient  tolerated the procedure well. Sponge, lap, needle, and instrument counts were correct x 2. The patient was transferred to the recovery room awake, alert and breathing independently in stable condition.  Cornelia Copaharlie Iceis Knab, Jr. MD Attending Center for [facility](Faculty Practice)

## 2017-02-05 ENCOUNTER — Encounter (HOSPITAL_COMMUNITY): Payer: Self-pay | Admitting: Obstetrics and Gynecology

## 2017-02-05 LAB — CBC
HCT: 29.1 % — ABNORMAL LOW (ref 36.0–46.0)
HEMOGLOBIN: 9.6 g/dL — AB (ref 12.0–15.0)
MCH: 25 pg — ABNORMAL LOW (ref 26.0–34.0)
MCHC: 33 g/dL (ref 30.0–36.0)
MCV: 75.8 fL — ABNORMAL LOW (ref 78.0–100.0)
Platelets: 190 10*3/uL (ref 150–400)
RBC: 3.84 MIL/uL — ABNORMAL LOW (ref 3.87–5.11)
RDW: 16.8 % — ABNORMAL HIGH (ref 11.5–15.5)
WBC: 11.5 10*3/uL — AB (ref 4.0–10.5)

## 2017-02-05 LAB — GLUCOSE, CAPILLARY: Glucose-Capillary: 79 mg/dL (ref 65–99)

## 2017-02-05 MED ORDER — GUAIFENESIN-CODEINE 100-10 MG/5ML PO SOLN
5.0000 mL | Freq: Four times a day (QID) | ORAL | Status: DC | PRN
  Administered 2017-02-05 – 2017-02-06 (×2): 5 mL via ORAL
  Filled 2017-02-05 (×2): qty 5

## 2017-02-05 NOTE — Progress Notes (Signed)
Interim Progress Note  S: Patient w/complaints of wheezing.  Vitals stable, no high pressures this afternoon and satting well on room air.  O: BP (!) 121/91   Pulse 72   Temp 98.1 F (36.7 C) (Axillary)   Resp 19   Ht 5\' 4"  (1.626 m)   Wt 184 lb (83.5 kg)   LMP 05/10/2016 (Approximate)   SpO2 98%   Breastfeeding? Unknown   BMI 31.58 kg/m   GEN: Comfortable, NAD PULM: +congestion appreciated in all lung fields, cleared easily with coughing and lungs CTA bil, Speaking in full sentences, no inc work of breathing, no rales wheezes or rhonchi CARD: RRR, no m/r/g  A/P: Patient with congestion easily cleared with coughing. - encourage incentive spirometry q1H or every commercial break - encourage OOB/ambulation - guaifenesin PRN to loosen mucous   Howard PouchFeng, Tatym Schermer, MD 02/05/2017, 8:01 PM PGY-1, [facility] pager [phone]

## 2017-02-05 NOTE — Progress Notes (Signed)
Pt called out asking for pain medication. RN took patient medication, but pt did not want what was brought and became angry saying the RN was trying to make her hurt. Pt also was overfeeding infant, and as RN was explaining the feeding amount of formula for an infant her age, pt again became extremely upset and said that the RN had no right to tell her how to care for her baby when the RN didn't have children and she knows what she is doing after 4 children.

## 2017-02-05 NOTE — Progress Notes (Signed)
Rn has attempted multiple times overnight to assist patient out of bed. Pt refuses, stating she is just too tired. Pt is constantly drowsy, falling asleep mid sentence, and requesting more pain medication. Pt is post-op 16 hours and has not gotten up once. RN provided encouragement and education related to movement in the post-op period, pt continues to refuse intervention.

## 2017-02-05 NOTE — Lactation Note (Signed)
This note was copied from a baby's chart. Lactation Consultation Note  Patient Name: Lisa Vazquez WUJWJ'XToday's Date: 02/05/2017 Reason for consult: Initial assessment  Initial visit at 26 hours of age.  Mom holding baby offering a bottle.  Mom reports recent breastfeeding attempts with baby on and off the breast or falling asleep. LC offered to assist with latching now mom declines. Mom reports pumping and bottle feeding with older children, because she didn't like the idea of baby on her breast.  When asked if she is comfortable latching this baby she reports that baby has to get it.  Mom in on subutex.   LC offered to set up DEBP and mom agreeable.  LC set up with instructions on frequency, cleaning and milk storage.  RN at bedside during this time.   [facility] resources given and discussed.  Encouraged to feed with early cues on demand.  Early newborn behavior discussed.  Hand expression demonstrated with colostrum visible.  Mom to call for assist as needed.     Maternal Data Has patient been taught Hand Expression?: Yes Does the patient have breastfeeding experience prior to this delivery?: Yes  Feeding Feeding Type: Bottle Fed - Formula Length of feed: 5 min  LATCH Score/Interventions                Intervention(s): Breastfeeding basics reviewed     Lactation Tools Discussed/Used WIC Program: Yes Pump Review: Setup, frequency, and cleaning;Milk Storage Initiated by:: JS IBCLC Date initiated:: 02/05/17   Consult Status Consult Status: Follow-up Date: 02/06/17 Follow-up type: In-patient    Jannifer RodneyShoptaw, Esme Freund Lynn 02/05/2017, 3:15 PM

## 2017-02-05 NOTE — Anesthesia Postprocedure Evaluation (Signed)
Anesthesia Post Note  Patient: Lisa Vazquez  Procedure(s) Performed: Procedure(s) (LRB): REPEAT CESAREAN SECTION WITH BILATERAL TUBAL LIGATION (Bilateral)  Patient location during evaluation: Mother Baby Anesthesia Type: Spinal Level of consciousness: awake and alert and oriented Pain management: satisfactory to patient Vital Signs Assessment: post-procedure vital signs reviewed and stable Respiratory status: spontaneous breathing and nonlabored ventilation Cardiovascular status: stable Postop Assessment: no headache, no backache, patient able to bend at knees, no signs of nausea or vomiting and adequate PO intake Anesthetic complications: no        Last Vitals:  Vitals:   02/05/17 0627 02/05/17 1100  BP: 105/75   Pulse: 64   Resp: 18   Temp: 36.7 C 36.7 C    Last Pain:  Vitals:   02/05/17 1200  TempSrc:   PainSc: 10-Worst pain ever   Pain Goal:                 Iris Tatsch

## 2017-02-05 NOTE — Progress Notes (Signed)
Post OP DAY #3 Subjective: wanting to get up to take a shower, reports normal lochia  Objective: Blood pressure 105/75, pulse 64, temperature 98.1 F (36.7 C), temperature source Oral, resp. rate 18, height 5\' 4"  (1.626 m), weight 83.5 kg (184 lb), last menstrual period 05/10/2016, SpO2 96 %, unknown if currently breastfeeding.  Physical Exam:  General: alert Lochia: appropriate Uterine Fundus: firm and appropriately tender at U Dressing: c/d/i DVT Evaluation: No evidence of DVT seen on physical exam.   Recent Labs  02/03/17 1030 02/05/17 0527  HGB 11.3* 9.6*  HCT 34.9* 29.1*    Assessment/Plan: Routine care    LOS: 1 day   Cammeron Greis C Jamyiah Labella 02/05/2017, 7:23 AM

## 2017-02-05 NOTE — Transfer of Care (Signed)
Immediate Anesthesia Transfer of Care Note  Patient: Lisa Vazquez  Procedure(s) Performed: Procedure(s): REPEAT CESAREAN SECTION WITH BILATERAL TUBAL LIGATION (Bilateral)  Patient Location: PACU  Anesthesia Type:Spinal  Level of Consciousness: awake, alert  and oriented  Airway & Oxygen Therapy: Patient Spontanous Breathing  Post-op Assessment: Report given to RN and Post -op Vital signs reviewed and stable  Post vital signs: Reviewed and stable  Last Vitals:  Vitals:   02/05/17 0155 02/05/17 0627  BP: 112/87 105/75  Pulse: 68 64  Resp: 16 18  Temp: 36.8 C 36.7 C    Last Pain:  Vitals:   02/05/17 0627  TempSrc: Oral  PainSc: 9          Complications: No apparent anesthesia complications

## 2017-02-06 MED ORDER — BUPRENORPHINE HCL 8 MG SL SUBL
8.0000 mg | SUBLINGUAL_TABLET | Freq: Every day | SUBLINGUAL | Status: DC
  Administered 2017-02-07: 8 mg via SUBLINGUAL
  Filled 2017-02-06: qty 1

## 2017-02-06 MED ORDER — OXYCODONE-ACETAMINOPHEN 5-325 MG PO TABS: 1.0000 | ORAL_TABLET | ORAL | 0 refills | Status: DC | PRN

## 2017-02-06 MED ORDER — IBUPROFEN 800 MG PO TABS: 800.0000 mg | ORAL_TABLET | Freq: Three times a day (TID) | ORAL | 0 refills | Status: DC | PRN

## 2017-02-06 NOTE — Progress Notes (Signed)
Patient has congested cough. Encouraged incentive spirometer but patient refused. Patient stated mucus is green in color.

## 2017-02-06 NOTE — Clinical Social Work Maternal (Signed)
CLINICAL SOCIAL WORK MATERNAL/CHILD NOTE  Patient Details  Name: DAWNETTE MIONE MRN: [id number] Date of Birth: [birth year]  Date:  02/06/2017  Clinical Social Worker Initiating Note:  Terri Piedra, [place] Date/ Time Initiated:  02/06/17/1400     Child's Name:  Sarajane Marek.   Legal Guardian:  Other (Comment) (Parents: Samella Parr and Rhae Lerner)   Need for Interpreter:  None   Date of Referral:  02/05/17     Reason for Referral:  Other (Comment), Current Substance Use/Substance Use During Pregnancy  (Hx THC, Subutex)   Referral Source:  [facility]   Address:  [address] [place], [place] [address]  Phone number:  [phone]   Household Members:  Significant Other, Minor Children ([name] lives with FOB, her three children from a previous relationship (ages 33, 72, and 70) and 33 33 year old (every other week).)   Natural Supports (not living in the home):  Parent, Friends ([name] reports that FOB, her mother and her best friend are her main support people.  )   Professional Supports: None ([name] is interested in starting counseling)   Employment:     Type of Work: Not discussed at this time, as conversation was very focused on NAS, hx of substance use and worry over telling FOB of baby's NICU admission reason.   Education:      Museum/gallery curator Resources:  Medicaid   Other Resources:      Cultural/Religious Considerations Which May Impact Care: None stated.  [name]'s facesheet notes religion as Psychologist, forensic.  Strengths:  Home prepared for child , Pediatrician chosen , Ability to meet basic needs  ([facility])   Risk Factors/Current Problems:  Adjustment to Illness , Family/Relationship Issues , Mental Health Concerns , Substance Use    Cognitive State:  Able to Concentrate , Alert , Linear Thinking , Goal Oriented    Mood/Affect:  Calm , Flat , Interested    CSW Assessment: CSW met with [name] in her first floor room/146 to introduce services, offer  support, and complete assessment due to hx of substance use and baby's admission to NICU for NAS.  [name] was quiet, but pleasant and receptive to CSW's visit, though seemingly apprehensive at first.  She was easy to engage, but presented with a flat affect.   CSW explained support role and reason for visit.  [name] reports that this is her 4th c-section and feels it "is the worst one."  She states she is in pain.  [name] provided update on baby that "they say he is withdrawing" and was open about being on Subutex.  She is clearly struggling with the stigma of substance use, which CSW processed with her at length.  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  She states her friend is on Subutex and suggested it to her.  CSW discussed the need to talk with FOB about the situation as, since he is the legal father, the NICU staff cannot withhold information from him about his baby.  [name] responded, "they can't tell him anything about me."  CSW agreed, but explained that telling him about the baby is inevitably telling him about [name].  [name] is frustrated at her decision to start Subutex and feels she was not prepared that baby could experience withdrawal.  She states she just started it in April and would have "gone cold [place]" if she knew that this was going to happen.  CSW explained that "going cold [place]" could have harmed her growing baby in utero and that it sounds to CSW that [name] took action against her addiction to pain pills with a legal and regulated treatment.  CSW asked [name] what her greatest fear in telling FOB about her Subutex rx is.  She replied, "that he will leave me."  CSW asked why she feels this way.  She states "we've been down this road before."  CSW asked for clarification.  She explained that she has been taking prescribed pain pills for the past few years and in the past he has told her "I don't want to be  with a junkie."  CSW continued to provide supportive brief counseling while she processed her feelings.  CSW aided [name] in re-framing her thinking about the situation and identifying positives.  She was able to do so with great support.  CSW asked about her motivation for change and her coping strategies.  She reports, "my kids keep me going.  Playing with my kids, listening to music and doing hair are all therapeutic to me."  CSW asked her to remember these things she has identified while she goes through this emotional experience.  CSW validated and normalized her emotions and provided education regarding PMADs.  CSW explained ongoing support services offered by NICU CSW and asked [name] to call any time she feels she would like to talk about her feelings.  CSW offered to be with [name] while she discusses the situation with FOB.  She initially declined and then told CSW that maybe she would like CSW to be present for support.  CSW informed [name] of CSW's time limits, but that the offer still stands and she stated understanding.  She is not sure when she is expecting FOB to return.  [name] reports hx of anxiety and depression and is interested in counseling recommended by CSW.  CSW provided her with resources to find mental health treatment.  [name] stated appreciation and seemed thankful for the opportunity to speak with CSW.  CSW provided contact information for ongoing support. [name] reports that she just moved into her own home yesterday.  The address on file in EPIC is her mom's address.  Her mother is caring for her children while she is in the hospital.  She states they have all necessary supplies for infant at home and is aware of SIDS precautions as reviewed by CSW.   CSW explained hospital drug screen policy due to Subutex use.  [name] stated understanding.  CSW informed [name] of her positive UDS for THC on 07/17/16 and Benzodiazepines on 02/04/17.  She reports no marijuana use since finding out she was pregnant and states  she has no prescriptions for benzodiazepines, but took a one Xanax.  She states this was not recently.  CSW notes that [name] was given Versed prior to collection of urine for drug screen on day of delivery, which could explain this result.  CSW discussed this with [name].  CSW informed [name] that baby's UDS is negative and CDS is pending.  CSW explained mandated reporting for positive screens of illegal substances or those not prescribed to a mother.  CSW explained referral to [facility] given prescribed substance exposure.  [name] stated understanding to all.   CSW asked [name] to notify CSW as to how the conversation goes with FOB if CSW is not present when it takes place.  CSW will continue to follow to offer support and will monitor CDS result.  CSW Plan/Description:  Information/Referral to [employer] , [employer] , Psychosocial Support and Ongoing Assessment of Needs    Alphonzo Cruise,  March 26, 2017, 4:09 PM

## 2017-02-06 NOTE — Discharge Summary (Signed)
OB Discharge Summary     Patient Name: Lisa Vazquez DOB: 01-18-1986 MRN: [id number]  Date of admission: 02/04/2017 Delivering MD:  Bing   Date of discharge: 02/06/2017  Admitting diagnosis: RCS Undesired Fertiltiy Intrauterine pregnancy: [redacted]w[redacted]d     Secondary diagnosis:  Active Problems:   History of cesarean delivery  Additional problems: CHTN, no meds      Discharge diagnosis: Term Pregnancy Delivered and CHTN                                                                                                Post partum procedures:none   Augmentation: planned repeat c-section   Complications: None  Hospital course:  Sceduled C/S   31 y.o. yo [id number] at [redacted]w[redacted]d was admitted to the hospital 02/04/2017 for scheduled cesarean section with the following indication:Elective Repeat.  Membrane Rupture Time/Date: 12:34 PM ,02/04/2017   Patient delivered a Viable infant.02/04/2017  Details of operation can be found in separate operative note.  Pateint had an uncomplicated postpartum course.  She is ambulating, tolerating a regular diet, passing flatus, and urinating well. Patient is discharged home in stable condition on  02/06/17         Physical exam  Vitals:   02/05/17 0627 02/05/17 1100 02/05/17 1900 02/06/17 0541  BP: 105/75  (!) 121/91 132/88  Pulse: 64  72 78  Resp: 18  19 16   Temp: 98.1 F (36.7 C) 98.1 F (36.7 C) 98.1 F (36.7 C) 98.4 F (36.9 C)  TempSrc: Oral Oral Axillary Oral  SpO2: 96% 95% 98% 97%  Weight:      Height:       General: alert, cooperative and no distress Lochia: appropriate Uterine Fundus: firm Incision: Healing well with no significant drainage DVT Evaluation: No evidence of DVT seen on physical exam. Labs: Lab Results  Component Value Date   WBC 11.5 (H) 02/05/2017   HGB 9.6 (L) 02/05/2017   HCT 29.1 (L) 02/05/2017   MCV 75.8 (L) 02/05/2017   PLT 190 02/05/2017   CMP Latest Ref Rng & Units 12/08/2016  Glucose 65 - 99 mg/dL  82  BUN 6 - 20 mg/dL 9  Creatinine 0.86 - 5.78 mg/dL 4.69  Sodium 629 - 528 mmol/L 136  Potassium 3.5 - 5.1 mmol/L 4.0  Chloride 101 - 111 mmol/L 107  CO2 22 - 32 mmol/L 26  Calcium 8.9 - 10.3 mg/dL 4.1(L)  Total Protein 6.5 - 8.1 g/dL 7.2  Total Bilirubin 0.3 - 1.2 mg/dL 0.3  Alkaline Phos 38 - 126 U/L 77  AST 15 - 41 U/L 14(L)  ALT 14 - 54 U/L 8(L)    Discharge instruction: per After Visit Summary and "Baby and Me Booklet".  After visit meds:   Diet: routine diet  Activity: Advance as tolerated. Pelvic rest for 6 weeks.   Outpatient follow up:2 weeks Follow up Appt:No future appointments. Follow up Visit:No Follow-up on file.  Postpartum contraception: Depo Provera  Newborn Data: Live born female  Birth Weight: 6 lb 7.7 oz (2940 g) APGAR: 9, 9  Baby Feeding: Breast Disposition:home with mother  02/06/2017 Thressa ShellerHeather Kenyanna Grzesiak, CNM

## 2017-02-06 NOTE — Plan of Care (Signed)
Problem: Pain Management: Goal: General experience of comfort will improve and pain level will decrease Outcome: Not Progressing Patient reports incisional pain levels of 9 consistently with minimum decrease with pain medication to a 7. Patient is taking Oxycodone IR 10 mg every 4-5 hours. Patient has a productive cough. Ibuprofen is given for pain management. Ambulation has been discussed to help management pain associated with gas and general soreness.   Problem: Respiratory: Goal: Ability to maintain adequate ventilation will improve Patient has a productive cough with green sputum. Patient has been instructed to use incentive spirometer every hour to help with deep breathing exercises. Instructed to ambulate in hallways or when visiting in NICU.

## 2017-02-06 NOTE — Lactation Note (Signed)
This note was copied from a baby's chart. Lactation Consultation Note  Patient Name: Boy Quintella BatonDominique Danielski JYNWG'NToday's Date: 02/06/2017 Reason for consult: Follow-up assessment Baby at 47 hr of life and transferred to NICU because of rising NAS scores. Mom is on Subutex but does not want FOB to know about her medications or about the baby going through withdrawal. Mom has been latching baby and stated that she plans to go to the NICU to latch baby more. She has DEBP set up. Encouraged mom to express milk if she is unable to latch baby. Given bottles to transport expressed milk and instructed her to get labels when she goes to visit baby. Given lactation NICU information. Mom is angry at this visit. It is unclear how much information she took in. [facility] "[facility]". She does not want to be d/c today, she desire to stay in the hospital with the baby. She does not have a pump at home. Reviewed hospital rental options if she is d/c without the baby. She is aware of lactation services and support group.   Maternal Data    Feeding Feeding Type: Bottle Fed - Formula Nipple Type: Slow - flow  LATCH Score/Interventions                      Lactation Tools Discussed/Used     Consult Status Consult Status: Follow-up Date: 02/07/17 Follow-up type: In-patient    Rulon Eisenmengerlizabeth E Harrison Zetina 02/06/2017, 12:13 PM

## 2017-02-07 MED ORDER — AMLODIPINE BESYLATE 5 MG PO TABS: 5.0000 mg | ORAL_TABLET | Freq: Every day | ORAL | 11 refills | Status: DC

## 2017-02-07 MED ORDER — AMLODIPINE BESYLATE 5 MG PO TABS
5.0000 mg | ORAL_TABLET | Freq: Every day | ORAL | Status: DC
  Filled 2017-02-07 (×2): qty 1

## 2017-02-07 NOTE — Discharge Summary (Signed)
OB Discharge Summary                           Patient Name: Lisa Vazquez DOB: April 19, 1986 MRN: [id number]  Date of admission: 02/04/2017 Delivering MD: Le Claire Bing   Date of discharge: 02/07/2017  Admitting diagnosis: rLTCS Undesired Fertility Intrauterine pregnancy: [redacted]w[redacted]d     Secondary diagnosis:  Active Problems:   History of cesarean delivery x 3  Additional problems: CHTN, no meds; GDMA1; hx pyelo; hx THC use; Subutex use                                      Discharge diagnosis: Term Pregnancy Delivered and CHTN                                                                                                Post partum procedures:none   Augmentation: planned repeat c-section   Complications: None  Hospital course:  Sceduled C/S   31 y.o. yo [id number] at [redacted]w[redacted]d was admitted to the hospital 02/04/2017 for scheduled cesarean section with the following indication:Elective Repeat.  Membrane Rupture Time/Date: 12:34 PM ,02/04/2017   Patient delivered a Viable infant.02/04/2017  Details of operation can be found in separate operative note.  Pateint had an uncomplicated postpartum course.  She is ambulating, tolerating a regular diet, passing flatus, and urinating well. Patient is discharged home in stable condition on  02/07/17. She was started on Norvasc 5mg  prior to d/c due to some recent elevated BPs.  She reports R back discomfort from her scapula on down. Dr Genevie Ann to eval and treat prior to d/c.          Physical exam        Vitals:   02/05/17 0627 02/05/17 1100 02/05/17 1900 02/06/17 0541  BP: 105/75  (!) 121/91 132/88  Pulse: 64  72 78  Resp: 18  19 16   Temp: 98.1 F (36.7 C) 98.1 F (36.7 C) 98.1 F (36.7 C) 98.4 F (36.9 C)  TempSrc: Oral Oral Axillary Oral  SpO2: 96% 95% 98% 97%  Weight:      Height:       General: alert, cooperative and no distress Lochia: appropriate Uterine Fundus: firm Incision: Healing well with no  significant drainage DVT Evaluation: No evidence of DVT seen on physical exam. Labs: Recent Labs       Lab Results  Component Value Date   WBC 11.5 (H) 02/05/2017   HGB 9.6 (L) 02/05/2017   HCT 29.1 (L) 02/05/2017   MCV 75.8 (L) 02/05/2017   PLT 190 02/05/2017     CMP Latest Ref Rng & Units 12/08/2016  Glucose 65 - 99 mg/dL 82  BUN 6 - 20 mg/dL 9  Creatinine 1.30 - 8.65 mg/dL 7.84  Sodium 696 - 295 mmol/L 136  Potassium 3.5 - 5.1 mmol/L 4.0  Chloride 101 - 111 mmol/L 107  CO2 22 - 32 mmol/L 26  Calcium 8.9 - 10.3 mg/dL 2.8(U)  Total Protein 6.5 -  8.1 g/dL 7.2  Total Bilirubin 0.3 - 1.2 mg/dL 0.3  Alkaline Phos 38 - 126 U/L 77  AST 15 - 41 U/L 14(L)  ALT 14 - 54 U/L 8(L)    Discharge instruction: per After Visit Summary and "Baby and Me Booklet".  After visit meds:  Allergies as of 02/07/2017   No Known Allergies     Medication List    STOP taking these medications   aspirin EC 81 MG tablet     TAKE these medications   acetaminophen 500 MG tablet Commonly known as:  TYLENOL Take 1,000 mg by mouth every 6 (six) hours as needed for mild pain or headache.   amLODipine 5 MG tablet Commonly known as:  NORVASC Take 1 tablet (5 mg total) by mouth daily.   buprenorphine 8 MG Subl SL tablet Commonly known as:  SUBUTEX Place 8 mg under the tongue daily.   CONCEPT OB 130-92.4-1 MG Caps Take 1 tablet by mouth daily.   ibuprofen 800 MG tablet Commonly known as:  ADVIL,MOTRIN Take 1 tablet (800 mg total) by mouth every 8 (eight) hours as needed.   nitrofurantoin (macrocrystal-monohydrate) 100 MG capsule Commonly known as:  MACROBID Take 100 mg by mouth at bedtime.   oxyCODONE-acetaminophen 5-325 MG tablet Commonly known as:  PERCOCET/ROXICET Take 1-2 tablets by mouth every 4 (four) hours as needed for severe pain. What changed:  how much to take  reasons to take this       Diet: routine diet  Activity: Advance as tolerated. Pelvic rest for 6  weeks.   Outpatient follow up:2 weeks Follow up Appt:No future appointments. Follow up Visit:No Follow-up on file.  Postpartum contraception: Depo Provera  Newborn Data: Live born female  Birth Weight: 6 lb 7.7 oz (2940 g) APGAR: 9, 9  Baby Feeding: Breast Disposition: NICU for NAS obs  SHAW, KIMBERLY CNM 02/07/2017 9:16 AM

## 2017-02-07 NOTE — Lactation Note (Signed)
Lactation Consultation Note: Mother reports that she has pumped several times and has not gotten any milk. Mother advised to do hand expression. She does see drops when hand expressed. Mother is active with WIC . [facility]. A harmony hand pump was give with instructions to pump for 15 mins on each breast every 2-3 hours. Mother receptive to plan. Advised mother to do good breast massage and ice every 3-4 hours for 15 mins. Mother has breastmilk labels and storage bottles. Discussed milk storage guidelines. [facility] when [facility] returns to hospital for questions , concerns or assistance with latching infant.   Patient Name: Lisa Vazquez MVHQI'OToday's Date: 02/07/2017 Reason for consult: Follow-up assessment   Maternal Data    Feeding    LATCH Score/Interventions                      Lactation Tools Discussed/Used     Consult Status Consult Status: Complete    Michel BickersKendrick, Myrtle Haller McCoy 02/07/2017, 11:32 AM

## 2017-02-09 ENCOUNTER — Encounter: Payer: Medicaid Other | Admitting: Obstetrics and Gynecology

## 2017-02-16 ENCOUNTER — Inpatient Hospital Stay (HOSPITAL_COMMUNITY): Payer: Medicaid Other

## 2017-02-16 ENCOUNTER — Inpatient Hospital Stay (HOSPITAL_COMMUNITY)
Admission: AD | Admit: 2017-02-16 | Discharge: 2017-02-16 | Disposition: A | Discharge status: Home / Self Care | Payer: Medicaid Other | Source: Ambulatory Visit | Attending: Obstetrics and Gynecology | Admitting: Obstetrics and Gynecology

## 2017-02-16 DIAGNOSIS — O10919 Unspecified pre-existing hypertension complicating pregnancy, unspecified trimester: Secondary | ICD-10-CM

## 2017-02-16 DIAGNOSIS — I517 Cardiomegaly: Secondary | ICD-10-CM | POA: Diagnosis not present

## 2017-02-16 DIAGNOSIS — J181 Lobar pneumonia, unspecified organism: Secondary | ICD-10-CM

## 2017-02-16 DIAGNOSIS — K5903 Drug induced constipation: Secondary | ICD-10-CM

## 2017-02-16 DIAGNOSIS — J189 Pneumonia, unspecified organism: Secondary | ICD-10-CM | POA: Insufficient documentation

## 2017-02-16 DIAGNOSIS — Z98891 History of uterine scar from previous surgery: Secondary | ICD-10-CM

## 2017-02-16 DIAGNOSIS — R079 Chest pain, unspecified: Secondary | ICD-10-CM | POA: Diagnosis present

## 2017-02-16 DIAGNOSIS — R918 Other nonspecific abnormal finding of lung field: Secondary | ICD-10-CM | POA: Insufficient documentation

## 2017-02-16 DIAGNOSIS — R0602 Shortness of breath: Secondary | ICD-10-CM

## 2017-02-16 DIAGNOSIS — Z9114 Patient's other noncompliance with medication regimen: Secondary | ICD-10-CM | POA: Diagnosis not present

## 2017-02-16 DIAGNOSIS — F17210 Nicotine dependence, cigarettes, uncomplicated: Secondary | ICD-10-CM | POA: Diagnosis not present

## 2017-02-16 DIAGNOSIS — K5900 Constipation, unspecified: Secondary | ICD-10-CM | POA: Diagnosis not present

## 2017-02-16 DIAGNOSIS — I10 Essential (primary) hypertension: Secondary | ICD-10-CM | POA: Diagnosis not present

## 2017-02-16 DIAGNOSIS — R109 Unspecified abdominal pain: Secondary | ICD-10-CM | POA: Diagnosis present

## 2017-02-16 LAB — CBC WITH DIFFERENTIAL/PLATELET
BASOS PCT: 0 %
Basophils Absolute: 0 10*3/uL (ref 0.0–0.1)
Eosinophils Absolute: 0.2 10*3/uL (ref 0.0–0.7)
Eosinophils Relative: 2 %
HEMATOCRIT: 32.9 % — AB (ref 36.0–46.0)
Hemoglobin: 10.5 g/dL — ABNORMAL LOW (ref 12.0–15.0)
LYMPHS ABS: 3 10*3/uL (ref 0.7–4.0)
Lymphocytes Relative: 38 %
MCH: 24.1 pg — AB (ref 26.0–34.0)
MCHC: 31.9 g/dL (ref 30.0–36.0)
MCV: 75.6 fL — ABNORMAL LOW (ref 78.0–100.0)
MONOS PCT: 5 %
Monocytes Absolute: 0.4 10*3/uL (ref 0.1–1.0)
NEUTROS ABS: 4.5 10*3/uL (ref 1.7–7.7)
Neutrophils Relative %: 55 %
Platelets: 335 10*3/uL (ref 150–400)
RBC: 4.35 MIL/uL (ref 3.87–5.11)
RDW: 16.9 % — ABNORMAL HIGH (ref 11.5–15.5)
WBC: 8.1 10*3/uL (ref 4.0–10.5)

## 2017-02-16 LAB — URINALYSIS, ROUTINE W REFLEX MICROSCOPIC
Bilirubin Urine: NEGATIVE
GLUCOSE, UA: NEGATIVE mg/dL
KETONES UR: 5 mg/dL — AB
Nitrite: NEGATIVE
PROTEIN: 30 mg/dL — AB
Specific Gravity, Urine: 1.009 (ref 1.005–1.030)
pH: 6 (ref 5.0–8.0)

## 2017-02-16 LAB — COMPREHENSIVE METABOLIC PANEL
ALT: 11 U/L — ABNORMAL LOW (ref 14–54)
AST: 18 U/L (ref 15–41)
Albumin: 3.1 g/dL — ABNORMAL LOW (ref 3.5–5.0)
Alkaline Phosphatase: 78 U/L (ref 38–126)
Anion gap: 10 (ref 5–15)
BILIRUBIN TOTAL: 0.6 mg/dL (ref 0.3–1.2)
BUN: 8 mg/dL (ref 6–20)
CO2: 24 mmol/L (ref 22–32)
CREATININE: 0.83 mg/dL (ref 0.44–1.00)
Calcium: 8.6 mg/dL — ABNORMAL LOW (ref 8.9–10.3)
Chloride: 103 mmol/L (ref 101–111)
Glucose, Bld: 122 mg/dL — ABNORMAL HIGH (ref 65–99)
Potassium: 3.5 mmol/L (ref 3.5–5.1)
Sodium: 137 mmol/L (ref 135–145)
TOTAL PROTEIN: 7 g/dL (ref 6.5–8.1)

## 2017-02-16 LAB — LACTIC ACID, PLASMA
LACTIC ACID, VENOUS: 1.3 mmol/L (ref 0.5–1.9)
LACTIC ACID, VENOUS: 1.1 mmol/L (ref 0.5–1.9)

## 2017-02-16 LAB — PROTEIN / CREATININE RATIO, URINE
Creatinine, Urine: 190 mg/dL
PROTEIN CREATININE RATIO: 0.33 mg/mg{creat} — AB (ref 0.00–0.15)
TOTAL PROTEIN, URINE: 62 mg/dL

## 2017-02-16 LAB — RAPID URINE DRUG SCREEN, HOSP PERFORMED
AMPHETAMINES: NOT DETECTED
BENZODIAZEPINES: NOT DETECTED
Barbiturates: NOT DETECTED
Cocaine: NOT DETECTED
OPIATES: NOT DETECTED
TETRAHYDROCANNABINOL: NOT DETECTED

## 2017-02-16 LAB — POCT PREGNANCY, URINE: PREG TEST UR: NEGATIVE

## 2017-02-16 LAB — C-REACTIVE PROTEIN: CRP: 10.8 mg/dL — AB (ref <1.0)

## 2017-02-16 MED ORDER — AMLODIPINE BESYLATE 10 MG PO TABS
10.0000 mg | ORAL_TABLET | Freq: Every day | ORAL | Status: DC
  Administered 2017-02-16: 10 mg via ORAL
  Filled 2017-02-16 (×2): qty 1

## 2017-02-16 MED ORDER — LEVOFLOXACIN 750 MG PO TABS: 750.0000 mg | ORAL_TABLET | Freq: Every day | ORAL | 0 refills | Status: AC

## 2017-02-16 MED ORDER — KETOROLAC TROMETHAMINE 60 MG/2ML IM SOLN
60.0000 mg | Freq: Once | INTRAMUSCULAR | Status: AC
Start: 2017-02-16 — End: 2017-02-16
  Administered 2017-02-16: 60 mg via INTRAMUSCULAR
  Filled 2017-02-16: qty 2

## 2017-02-16 MED ORDER — AMOXICILLIN-POT CLAVULANATE 875-125 MG PO TABS: 1.0000 | ORAL_TABLET | Freq: Two times a day (BID) | ORAL | 0 refills | Status: AC

## 2017-02-16 MED ORDER — IOPAMIDOL (ISOVUE-300) INJECTION 61%: 100.0000 mL | Freq: Once | INTRAVENOUS | Status: DC | PRN

## 2017-02-16 MED ORDER — CYCLOBENZAPRINE HCL 10 MG PO TABS
10.0000 mg | ORAL_TABLET | Freq: Once | ORAL | Status: AC
  Administered 2017-02-16: 10 mg via ORAL
  Filled 2017-02-16: qty 1

## 2017-02-16 MED ORDER — AMLODIPINE BESYLATE 10 MG PO TABS: 10.0000 mg | ORAL_TABLET | Freq: Every day | ORAL | 0 refills | Status: DC

## 2017-02-16 MED ORDER — IOPAMIDOL (ISOVUE-370) INJECTION 76%
100.0000 mL | Freq: Once | INTRAVENOUS | Status: AC | PRN
  Administered 2017-02-16: 100 mL via INTRAVENOUS

## 2017-02-16 MED ORDER — DEXTROSE 5 % IV SOLN
2.0000 g | Freq: Once | INTRAVENOUS | Status: AC
  Administered 2017-02-16: 2 g via INTRAVENOUS
  Filled 2017-02-16: qty 2

## 2017-02-16 MED ORDER — POLYETHYLENE GLYCOL 3350 17 G PO PACK: 17.0000 g | PACK | Freq: Every day | ORAL | 0 refills | Status: DC

## 2017-02-16 MED ORDER — LACTATED RINGERS IV BOLUS (SEPSIS)
1000.0000 mL | Freq: Once | INTRAVENOUS | Status: AC
  Administered 2017-02-16: 1000 mL via INTRAVENOUS

## 2017-02-16 NOTE — Discharge Instructions (Signed)
Community-Acquired Pneumonia, Adult Pneumonia is an infection of the lungs. One type of pneumonia can happen while a person is in a hospital. A different type can happen when a person is not in a hospital (community-acquired pneumonia). It is easy for this kind to spread from person to person. It can spread to you if you breathe near an infected person who coughs or sneezes. Some symptoms include:  A dry cough.  A wet (productive) cough.  Fever.  Sweating.  Chest pain. Follow these instructions at home:  Take over-the-counter and prescription medicines only as told by your doctor.  Only take cough medicine if you are losing sleep.  If you were prescribed an antibiotic medicine, take it as told by your doctor. Do not stop taking the antibiotic even if you start to feel better.  Sleep with your head and neck raised (elevated). You can do this by putting a few pillows under your head, or you can sleep in a recliner.  Do not use tobacco products. These include cigarettes, chewing tobacco, and e-cigarettes. If you need help quitting, ask your doctor.  Drink enough water to keep your pee (urine) clear or pale yellow. A shot (vaccine) can help prevent pneumonia. Shots are often suggested for:  People older than 33 years of age.  People older than 33 years of age:  Who are having cancer treatment.  Who have long-term (chronic) lung disease.  Who have problems with their body's defense system (immune system). You may also prevent pneumonia if you take these actions:  Get the flu (influenza) shot every year.  Go to the dentist as often as told.  Wash your hands often. If soap and water are not available, use hand sanitizer. Contact a doctor if:  You have a fever.  You lose sleep because your cough medicine does not help. Get help right away if:  You are short of breath and it gets worse.  You have more chest pain.  Your sickness gets worse. This is very serious if:  You  are an older adult.  Your body's defense system is weak.  You cough up blood. This information is not intended to replace advice given to you by your health care provider. Make sure you discuss any questions you have with your health care provider. Document Released: 02/25/2008 Document Revised: 02/14/2016 Document Reviewed: 01/03/2015 Elsevier Interactive Patient Education  2017 [employer].   Constipation, Adult Constipation is when a person has fewer bowel movements in a week than normal, has difficulty having a bowel movement, or has stools that are dry, hard, or larger than normal. Constipation may be caused by an underlying condition. It may become worse with age if a person takes certain medicines and does not take in enough fluids. Follow these instructions at home: Eating and drinking    Eat foods that have a lot of fiber, such as fresh fruits and vegetables, whole grains, and beans.  Limit foods that are high in fat, low in fiber, or overly processed, such as french fries, hamburgers, cookies, candies, and soda.  Drink enough fluid to keep your urine clear or pale yellow. General instructions   Exercise regularly or as told by your health care provider.  Go to the restroom when you have the urge to go. Do not hold it in.  Take over-the-counter and prescription medicines only as told by your health care provider. These include any fiber supplements.  Practice pelvic floor retraining exercises, such as deep breathing while relaxing  the lower abdomen and pelvic floor relaxation during bowel movements.  Watch your condition for any changes.  Keep all follow-up visits as told by your health care provider. This is important. Contact a health care provider if:  You have pain that gets worse.  You have a fever.  You do not have a bowel movement after 4 days.  You vomit.  You are not hungry.  You lose weight.  You are bleeding from the anus.  You have thin,  pencil-like stools. Get help right away if:  You have a fever and your symptoms suddenly get worse.  You leak stool or have blood in your stool.  Your abdomen is bloated.  You have severe pain in your abdomen.  You feel dizzy or you faint. This information is not intended to replace advice given to you by your health care provider. Make sure you discuss any questions you have with your health care provider. Document Released: 06/06/2004 Document Revised: 03/28/2016 Document Reviewed: 02/27/2016 Elsevier Interactive Patient Education  2017 [employer]

## 2017-02-16 NOTE — MAU Provider Note (Signed)
History     CSN: [id number]  Arrival date and time: 02/16/17 1431   None     Chief Complaint  Patient presents with  . Abdominal Pain  . Back Pain   HPI   Lisa Vazquez is a 31 y.o. female here in MAU with complaints of abdominal pain and back pain. Patient is status post repeat cesarean section on 5/16, c-section done by Dr. Vergie Living. The back pain started while she was in the hospital. The abdominal pain started 3-4 days ago.  She is taking tylenol and ibuprofen for the pain which is not helping. Patient on Subutex.  Patient has had a terrible cough for the last few weeks. Patient says she "feels terrible". Says she had this cough prior to her c-section.  Nothing makes the cough better. + fever, + shortness of breath.   OB History    Gravida Para Term Preterm AB Living   5 4 2 2 1 4    SAB TAB Ectopic Multiple Live Births     1 0 0 4      Past Medical History:  Diagnosis Date  . BV (bacterial vaginosis)   . Gestational diabetes   . Hypertension   . Kidney infection   . UTI (lower urinary tract infection)     Past Surgical History:  Procedure Laterality Date  . CESAREAN SECTION     x3  . CESAREAN SECTION WITH BILATERAL TUBAL LIGATION Bilateral 02/04/2017   Procedure: REPEAT CESAREAN SECTION WITH BILATERAL TUBAL LIGATION;  Surgeon: Wells Bing, MD;  Location: [facility] BIRTHING SUIT[facility];  Service: Obstetrics;  Laterality: Bilateral;    Family History  Problem Relation Age of Onset  . Hypertension Mother   . Heart disease Father   . Asthma Brother     Social History  Substance Use Topics  . Smoking status: Current Some Day Smoker    Packs/day: 0.25    Years: 7.00    Types: Cigarettes  . Smokeless tobacco: Former Neurosurgeon     Comment: 3 cigs/day, has used vape pens  . Alcohol use No    Allergies: No Known Allergies  Prescriptions Prior to Admission  Medication Sig Dispense Refill Last Dose  . acetaminophen (TYLENOL) 500 MG tablet Take 1,000 mg by mouth  every 6 (six) hours as needed for mild pain or headache.   02/16/2017 at Unknown time  . amLODipine (NORVASC) 5 MG tablet Take 1 tablet (5 mg total) by mouth daily. 30 tablet 11 02/16/2017 at Unknown time  . buprenorphine (SUBUTEX) 8 MG SUBL SL tablet Place 8 mg under the tongue daily.    02/16/2017 at Unknown time  . ibuprofen (ADVIL,MOTRIN) 800 MG tablet Take 1 tablet (800 mg total) by mouth every 8 (eight) hours as needed. 30 tablet 0 02/15/2017 at Unknown time  . nitrofurantoin, macrocrystal-monohydrate, (MACROBID) 100 MG capsule Take 100 mg by mouth at bedtime.   02/15/2017 at Unknown time  . oxyCODONE-acetaminophen (PERCOCET/ROXICET) 5-325 MG tablet Take 1-2 tablets by mouth every 4 (four) hours as needed for severe pain. 6 tablet 0 Past Month at Unknown time  . Prenat w/o A Vit-FeFum-FePo-FA (CONCEPT OB) 130-92.4-1 MG CAPS Take 1 tablet by mouth daily. 30 capsule 12 Past Month at Unknown time   Results for orders placed or performed during the hospital encounter of 02/16/17 (from the past 48 hour(s))  Urinalysis, Routine w reflex microscopic     Status: Abnormal   Collection Time: 02/16/17  2:44 PM  Result Value Ref Range  Color, Urine YELLOW YELLOW   APPearance CLEAR CLEAR   Specific Gravity, Urine 1.009 1.005 - 1.030   pH 6.0 5.0 - 8.0   Glucose, UA NEGATIVE NEGATIVE mg/dL   Hgb urine dipstick LARGE (A) NEGATIVE   Bilirubin Urine NEGATIVE NEGATIVE   Ketones, ur 5 (A) NEGATIVE mg/dL   Protein, ur 30 (A) NEGATIVE mg/dL   Nitrite NEGATIVE NEGATIVE   Leukocytes, UA TRACE (A) NEGATIVE   RBC / HPF TOO NUMEROUS TO COUNT 0 - 5 RBC/hpf   WBC, UA 6-30 0 - 5 WBC/hpf   Bacteria, UA RARE (A) NONE SEEN   Squamous Epithelial / LPF 0-5 (A) NONE SEEN  Pregnancy, urine POC     Status: None   Collection Time: 02/16/17  2:59 PM  Result Value Ref Range   Preg Test, Ur NEGATIVE NEGATIVE    Comment:        THE SENSITIVITY OF THIS METHODOLOGY IS >24 mIU/mL    Review of Systems  Constitutional:  Positive for chills, diaphoresis and fever.  HENT: Negative for congestion and rhinorrhea.   Respiratory: Positive for cough and shortness of breath. Negative for choking, chest tightness and wheezing.   Cardiovascular: Negative for chest pain and palpitations.  Gastrointestinal: Positive for abdominal pain and constipation (Last BM was 5/15- taking it exery day.). Negative for diarrhea, nausea and vomiting.  Genitourinary: Negative for difficulty urinating, dysuria and frequency.  Neurological: Negative for dizziness, weakness and headaches.   Physical Exam   Blood pressure (!) 141/106, pulse (!) 115, temperature 98.9 F (37.2 C), temperature source Other (Comment), resp. rate 18, SpO2 100 %, unknown if currently breastfeeding.  Physical Exam  Vitals reviewed. Constitutional: She is oriented to person, place, and time. She appears well-developed and well-nourished.  HENT:  Head: Normocephalic and atraumatic.  Cardiovascular: Normal rate, normal heart sounds and intact distal pulses.   Respiratory: Effort normal. No respiratory distress. She has no wheezes.  Rhonchi in all lung fields  GI: Soft. She exhibits distension. There is tenderness. There is no rebound and no guarding.  Musculoskeletal: Normal range of motion. She exhibits no edema.  Neurological: She is alert and oriented to person, place, and time. No cranial nerve deficit.  Skin: Skin is warm and dry.  Psychiatric: She has a normal mood and affect. Her behavior is normal.    MAU Course  Procedures  None  MDM  Sepsis protocol initiated  Lactic acid Blood cultures X2 CBC with diff CMP  PCR Urine culture Chest xray  1535: Dr. Vergie Living called and notified of patients arrival.  Requested attending to come to bedside.  It was requested that I call Dr. Genevie Ann to the bedside. Dr. Genevie Ann called and at the bedside.   I assumed care of the patient at approximately 3:30. Patient was evaluated and most likely had pneumonia.  Chest x-ray and flatplate were performed. Chest x-ray was reviewed and showed likely a right lower lobe pneumonia. Additionally abdominal flatplate showed significant constipation. Blood work was reviewed and was essentially unremarkable patient had normal CBC with no left shift no lactic acidosis CMP was also noted to be normal. Patient has a history of chronic hypertension and had elevated blood pressures. She has not been taking her blood pressure medicine as prescribed on discharge from the hospital. Protein creatinine ratio is 0.33 but is stable from her baseline protein to creatinine. She has no other severe features of preeclampsia.  -Patient did receive 10 mg of Norvasc well in the emergency room.  Was discussed with Dr. Vergie LivingPickens who was unsure if the patient should be discharged or remain in the hospital. He recommended admission and contacted the infectious disease. They recommended Augmentin and levofloxacin to treat her pneumonia. Additionally he recommended a CT scan of the chest to rule out pulmonary embolus.  Patient did have to give again constipation and did receive a soapsuds enema with significant bowel movement afterwards in significant improvement of her lower back and abdominal pain.  Assessment and Plan  #1: Community-acquired pneumonia per infectious disease okay to DC and treat with Augmentin and levofloxacin. Augmentin prescribed 10 days. 7 day course of levofloxacin prescribed. I did confirm with the patient that she was not breast-feeding given that we are going to prescribe levofloxacin. Patient had no pulse ox reading less than 90% and most were above 95%. #2: Chronic hypertension. Patient noncompliant with medications. She denies headache blurred vision. She has no right upper quadrant pain. Patient was started back on her 10 mg of Norvasc and given precautions to return to MAU. #3: Constipation likely secondary to Subutex use in addition to decreased activity. Recommend  MiraLAX daily for at least a week. Prescription sent. Significant improvement with soapsuds enema. #4: Cardiomegaly CT and chest x-ray did reveal some mild cardiomegaly patient did not appear significantly fluid overloaded however and can be followed as an outpatient if her symptoms do not improve with treatment of her pneumonia.  Given patient's multiple comorbidities patient was offered a 24-hour hospital admission for observation of blood pressure and he acquired pneumonia patient declined given childcare issues. Given strict precautions to return with worsening shortness of breath fever that does not improve with Tylenol or headache blurred vision right upper quadrant pain or significant increase in her swelling. Patient voiced understanding.  Ernestina PennaNicholas Schenk, MD 02/16/17 8:32 PM

## 2017-02-16 NOTE — Progress Notes (Signed)
   02/16/17 2007  Vital Signs  BP (!) 153/106 (Dr. Genevie AnnSchenk notified)  Pulse Rate 94  Resp 20  Temp 98.6 F (37 C)  Temp Source Oral  Pt's BP before DC as above. Dr. Genevie AnnSchenk notified. MD ok to DC pt home

## 2017-02-16 NOTE — Progress Notes (Addendum)
Presents to triage for SOB, cough, back chest pain since being discharged last Saturday. Nurse was informed by the pt that she was not feeling well but was discharged home anyway. Pt has been taking motrin and tylenol but states isnt helping with the pain. Last time taken tylenol was at 0900.   Last BM almost 2 wks ago.  1507: temperature 98.9 oral, 98% O2 sat on RA, HR 103  1525: Lab at bs.  1530: labs drawn and IV inserted. Flushed w 10cc NS.   1535: MD at bs assessing pt. POC discussed. Lab will draw cultures.   1555: lab still in room drawing cultures from other side of arm  1607: urine collected via straight cath and sent to lab.   1618: pt to radiology via wheel chair  1730 back from RAD.   1745: Soap sud enema given. Great result.   1810: Provider at bs. Pt refusing admission. CT scan ordered  1813: RAD paged. RAD returned page. Report status of pt given. Will call back once tech in hospital  1836: pt to CT.

## 2017-02-16 NOTE — MAU Note (Signed)
Patient presents with really bad pain in lower abdominal pain, coughing up blood chest and back hurting, C/S 02/04/17

## 2017-02-17 LAB — CULTURE, OB URINE: Special Requests: NORMAL

## 2017-02-18 ENCOUNTER — Ambulatory Visit: Payer: Medicaid Other | Admitting: Obstetrics

## 2017-02-21 LAB — CULTURE, BLOOD (ROUTINE X 2)
Culture: NO GROWTH
Culture: NO GROWTH
Special Requests: ADEQUATE

## 2017-03-08 ENCOUNTER — Inpatient Hospital Stay (HOSPITAL_COMMUNITY)
Admission: EM | Admit: 2017-03-08 | Discharge: 2017-03-09 | DRG: 291 | Discharge status: Against Medical Advice | Payer: Medicaid Other | Attending: Internal Medicine | Admitting: Internal Medicine

## 2017-03-08 ENCOUNTER — Emergency Department (HOSPITAL_COMMUNITY): Payer: Medicaid Other

## 2017-03-08 ENCOUNTER — Other Ambulatory Visit: Payer: Self-pay

## 2017-03-08 ENCOUNTER — Encounter (HOSPITAL_COMMUNITY): Payer: Self-pay | Admitting: Emergency Medicine

## 2017-03-08 DIAGNOSIS — R9431 Abnormal electrocardiogram [ECG] [EKG]: Secondary | ICD-10-CM | POA: Diagnosis not present

## 2017-03-08 DIAGNOSIS — I509 Heart failure, unspecified: Secondary | ICD-10-CM

## 2017-03-08 DIAGNOSIS — Z79899 Other long term (current) drug therapy: Secondary | ICD-10-CM

## 2017-03-08 DIAGNOSIS — O99335 Smoking (tobacco) complicating the puerperium: Secondary | ICD-10-CM | POA: Diagnosis present

## 2017-03-08 DIAGNOSIS — O9081 Anemia of the puerperium: Secondary | ICD-10-CM | POA: Diagnosis present

## 2017-03-08 DIAGNOSIS — Z791 Long term (current) use of non-steroidal anti-inflammatories (NSAID): Secondary | ICD-10-CM | POA: Diagnosis not present

## 2017-03-08 DIAGNOSIS — J189 Pneumonia, unspecified organism: Secondary | ICD-10-CM | POA: Diagnosis present

## 2017-03-08 DIAGNOSIS — O903 Peripartum cardiomyopathy: Secondary | ICD-10-CM | POA: Diagnosis present

## 2017-03-08 DIAGNOSIS — I10 Essential (primary) hypertension: Secondary | ICD-10-CM

## 2017-03-08 DIAGNOSIS — F1911 Other psychoactive substance abuse, in remission: Secondary | ICD-10-CM | POA: Diagnosis present

## 2017-03-08 DIAGNOSIS — I5041 Acute combined systolic (congestive) and diastolic (congestive) heart failure: Secondary | ICD-10-CM

## 2017-03-08 DIAGNOSIS — I248 Other forms of acute ischemic heart disease: Secondary | ICD-10-CM | POA: Diagnosis present

## 2017-03-08 DIAGNOSIS — Z5321 Procedure and treatment not carried out due to patient leaving prior to being seen by health care provider: Secondary | ICD-10-CM | POA: Diagnosis not present

## 2017-03-08 DIAGNOSIS — I517 Cardiomegaly: Secondary | ICD-10-CM

## 2017-03-08 DIAGNOSIS — Z8632 Personal history of gestational diabetes: Secondary | ICD-10-CM | POA: Diagnosis not present

## 2017-03-08 DIAGNOSIS — I110 Hypertensive heart disease with heart failure: Secondary | ICD-10-CM | POA: Diagnosis present

## 2017-03-08 DIAGNOSIS — F17210 Nicotine dependence, cigarettes, uncomplicated: Secondary | ICD-10-CM | POA: Diagnosis present

## 2017-03-08 DIAGNOSIS — I360 Nonrheumatic tricuspid (valve) stenosis: Secondary | ICD-10-CM | POA: Diagnosis not present

## 2017-03-08 DIAGNOSIS — E876 Hypokalemia: Secondary | ICD-10-CM | POA: Diagnosis not present

## 2017-03-08 DIAGNOSIS — I5023 Acute on chronic systolic (congestive) heart failure: Secondary | ICD-10-CM | POA: Diagnosis present

## 2017-03-08 DIAGNOSIS — I4581 Long QT syndrome: Secondary | ICD-10-CM | POA: Diagnosis present

## 2017-03-08 HISTORY — DX: Acute combined systolic (congestive) and diastolic (congestive) heart failure: I50.41

## 2017-03-08 HISTORY — DX: Essential (primary) hypertension: I10

## 2017-03-08 LAB — COMPREHENSIVE METABOLIC PANEL
ALK PHOS: 63 U/L (ref 38–126)
ALT: 9 U/L — ABNORMAL LOW (ref 14–54)
ANION GAP: 9 (ref 5–15)
AST: 16 U/L (ref 15–41)
Albumin: 3.4 g/dL — ABNORMAL LOW (ref 3.5–5.0)
BUN: 8 mg/dL (ref 6–20)
CALCIUM: 9.1 mg/dL (ref 8.9–10.3)
CO2: 27 mmol/L (ref 22–32)
Chloride: 106 mmol/L (ref 101–111)
Creatinine, Ser: 1.03 mg/dL — ABNORMAL HIGH (ref 0.44–1.00)
GFR calc non Af Amer: >60 mL/min (ref >60)
Glucose, Bld: 98 mg/dL (ref 65–99)
Potassium: 3.6 mmol/L (ref 3.5–5.1)
SODIUM: 142 mmol/L (ref 135–145)
Total Bilirubin: 0.4 mg/dL (ref 0.3–1.2)
Total Protein: 6.8 g/dL (ref 6.5–8.1)

## 2017-03-08 LAB — URINALYSIS, MICROSCOPIC (REFLEX)

## 2017-03-08 LAB — CBC WITH DIFFERENTIAL/PLATELET
Basophils Absolute: 0 10*3/uL (ref 0.0–0.1)
Basophils Relative: 0 %
EOS ABS: 0.2 10*3/uL (ref 0.0–0.7)
EOS PCT: 3 %
HCT: 32.8 % — ABNORMAL LOW (ref 36.0–46.0)
Hemoglobin: 10.4 g/dL — ABNORMAL LOW (ref 12.0–15.0)
LYMPHS ABS: 3.2 10*3/uL (ref 0.7–4.0)
Lymphocytes Relative: 52 %
MCH: 23.2 pg — AB (ref 26.0–34.0)
MCHC: 31.7 g/dL (ref 30.0–36.0)
MCV: 73.1 fL — ABNORMAL LOW (ref 78.0–100.0)
MONOS PCT: 8 %
Monocytes Absolute: 0.5 10*3/uL (ref 0.1–1.0)
Neutro Abs: 2.3 10*3/uL (ref 1.7–7.7)
Neutrophils Relative %: 37 %
PLATELETS: 317 10*3/uL (ref 150–400)
RBC: 4.49 MIL/uL (ref 3.87–5.11)
RDW: 17.2 % — ABNORMAL HIGH (ref 11.5–15.5)
WBC: 6.1 10*3/uL (ref 4.0–10.5)

## 2017-03-08 LAB — URINALYSIS, ROUTINE W REFLEX MICROSCOPIC
BILIRUBIN URINE: NEGATIVE
Glucose, UA: NEGATIVE mg/dL
KETONES UR: NEGATIVE mg/dL
NITRITE: NEGATIVE
PROTEIN: 30 mg/dL — AB
pH: 6.5 (ref 5.0–8.0)

## 2017-03-08 LAB — BRAIN NATRIURETIC PEPTIDE: B NATRIURETIC PEPTIDE 5: 2859.4 pg/mL — AB (ref 0.0–100.0)

## 2017-03-08 LAB — POCT I-STAT TROPONIN I: TROPONIN I, POC: 0.04 ng/mL (ref 0.00–0.08)

## 2017-03-08 LAB — PROTEIN / CREATININE RATIO, URINE
CREATININE, URINE: 96.82 mg/dL
Protein Creatinine Ratio: 0.33 mg/mg{Cre} — ABNORMAL HIGH (ref 0.00–0.15)
Total Protein, Urine: 32 mg/dL

## 2017-03-08 MED ORDER — IOPAMIDOL (ISOVUE-370) INJECTION 76%
100.0000 mL | Freq: Once | INTRAVENOUS | Status: AC | PRN
  Administered 2017-03-08: 100 mL via INTRAVENOUS

## 2017-03-08 MED ORDER — MORPHINE SULFATE (PF) 4 MG/ML IV SOLN
4.0000 mg | Freq: Once | INTRAVENOUS | Status: AC
  Administered 2017-03-08: 4 mg via INTRAVENOUS
  Filled 2017-03-08: qty 1

## 2017-03-08 MED ORDER — ACETAMINOPHEN 325 MG PO TABS: 650.0000 mg | ORAL_TABLET | Freq: Four times a day (QID) | ORAL | Status: DC | PRN

## 2017-03-08 MED ORDER — AMLODIPINE BESYLATE 5 MG PO TABS
10.0000 mg | ORAL_TABLET | Freq: Every day | ORAL | Status: DC
  Administered 2017-03-09: 10 mg via ORAL
  Filled 2017-03-08: qty 2

## 2017-03-08 MED ORDER — IOPAMIDOL (ISOVUE-370) INJECTION 76%
INTRAVENOUS | Status: AC
  Filled 2017-03-08: qty 100

## 2017-03-08 MED ORDER — FUROSEMIDE 10 MG/ML IJ SOLN
20.0000 mg | Freq: Once | INTRAMUSCULAR | Status: AC
  Administered 2017-03-08: 20 mg via INTRAVENOUS
  Filled 2017-03-08: qty 4

## 2017-03-08 MED ORDER — ENOXAPARIN SODIUM 40 MG/0.4ML ~~LOC~~ SOLN
40.0000 mg | SUBCUTANEOUS | Status: DC
  Administered 2017-03-09: 40 mg via SUBCUTANEOUS
  Filled 2017-03-08: qty 0.4

## 2017-03-08 MED ORDER — FUROSEMIDE 10 MG/ML IJ SOLN
20.0000 mg | Freq: Two times a day (BID) | INTRAMUSCULAR | Status: DC
  Administered 2017-03-09: 20 mg via INTRAVENOUS
  Filled 2017-03-08: qty 2

## 2017-03-08 MED ORDER — ACETAMINOPHEN 650 MG RE SUPP: 650.0000 mg | Freq: Four times a day (QID) | RECTAL | Status: DC | PRN

## 2017-03-08 NOTE — ED Triage Notes (Signed)
Pt c/o cough, SOB worse on laying down, pleuritic pain with inspiration and cough x 1 month, pain reproducible with palpation and inspiration. Pt delivered baby 1 month ago, diagnosed with pneumonia 1 week later. Treated with amoxacillin and Levaquin. Never felt improved.

## 2017-03-08 NOTE — ED Provider Notes (Addendum)
[facility]-EMERGENCY DEPT Provider Note   CSN: [id number] Arrival date & time: 03/08/17  1228     History   Chief Complaint Chief Complaint  Patient presents with  . Shortness of Breath    HPI Lisa Vazquez is a [age] y.o. female.  The history is provided by the patient.  Shortness of Breath  This is a new problem. The problem occurs continuously.The current episode started more than 1 week ago. The problem has not changed since onset.Associated symptoms include cough, sputum production, PND, orthopnea and chest pain. Pertinent negatives include no fever, no headaches, no rhinorrhea, no sore throat, no syncope, no vomiting, no abdominal pain, no leg pain and no leg swelling. It is unknown what precipitated the problem.   31 year old female who is one month postpartum from a cesarean section who presents with shortness of breath. She also has a history of chronic hypertension. States that 1 week after her delivery, she developed pneumonia. She was treated with amoxicillin and Levaquin. States that despite finishing antibiotic she continues to have cough productive of green sputum, shortness of breath with activity, and chest discomfort. Chest pain sharp, worse with deep inhalation, cough, movement, and palpation. He states that when she sleeps at night, she wakes up with shortness of breath, and feels short of breath when she is lying down to sleep. No known fevers or chills. No vomiting, abdominal pain, headache or vision changes. Lower extremity edema or leg pain.  Past Medical History:  Diagnosis Date  . BV (bacterial vaginosis)   . Gestational diabetes    gestational  . Hypertension   . Kidney infection   . UTI (lower urinary tract infection)     Patient Active Problem List   Diagnosis Date Noted  . History of cesarean delivery 02/03/2017  . Pregnancy complicated by subutex maintenance, antepartum (HCC) 01/26/2017  . Gestational diabetes mellitus, antepartum 11/14/2016  .  Pyelonephritis affecting pregnancy in third trimester 11/09/2016  . Marijuana use 07/26/2016  . 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 High blood hemoglobin F (HCC) 07/26/2016  . History of recurrent UTIs 07/24/2016  . Supervision of high-risk pregnancy 07/17/2016  . Chronic hypertension during pregnancy, antepartum 07/17/2016  . CIN I (cervical intraepithelial neoplasia I) 04/06/2013  . Atypical squamous cell changes of undetermined significance (ASCUS) on cervical cytology with positive high risk human papilloma virus (HPV) 03/17/2013    Past Surgical History:  Procedure Laterality Date  . CESAREAN SECTION     x3  . CESAREAN SECTION WITH BILATERAL TUBAL LIGATION Bilateral 02/04/2017   Procedure: REPEAT CESAREAN SECTION WITH BILATERAL TUBAL LIGATION;  Surgeon: Kingston BingPickens, Charlie, MD;  [facility];  Service: Obstetrics;  Laterality: Bilateral;    OB History    Gravida Para Term Preterm AB Living   5 4 2 2 1 4    SAB TAB Ectopic Multiple Live Births     1 0 0 4       Home Medications    Prior to Admission medications   Medication Sig Start Date End Date Taking? Authorizing Provider  acetaminophen (TYLENOL) 500 MG tablet Take 1,000 mg by mouth every 6 (six) hours as needed for mild pain or headache.    [provider]  amLODipine (NORVASC) 10 MG tablet Take 1 tablet (10 mg total) by mouth daily. 02/17/17   Lorne SkeensSchenk, Nicholas Michael, MD  buprenorphine (SUBUTEX) 8 MG SUBL SL tablet Place 8 mg under the tongue daily.     [provider]  ibuprofen (ADVIL,MOTRIN) 800 MG tablet Take 1 tablet (800 mg total) by mouth every 8 (eight) hours as needed. 02/06/17   Armando Reichert, CNM  nitrofurantoin, macrocrystal-monohydrate, (MACROBID) 100 MG capsule Take 100 mg by mouth at bedtime.    [provider]  oxyCODONE-acetaminophen (PERCOCET/ROXICET) 5-325 MG tablet Take 1-2 tablets by mouth every 4 (four) hours as  needed for severe pain. 02/06/17   Thressa Sheller D, CNM  polyethylene glycol (MIRALAX / GLYCOLAX) packet Take 17 g by mouth daily. 02/16/17   Lorne Skeens, MD  Prenat w/o A Vit-FeFum-FePo-FA (CONCEPT OB) 130-92.4-1 MG CAPS Take 1 tablet by mouth daily. 07/17/16   Dorathy Kinsman, CNM    Family History Family History  Problem Relation Age of Onset  . Hypertension Mother   . Heart disease Father   . Asthma Brother     Social History Social History  Substance Use Topics  . Smoking status: Current Some Day Smoker    Packs/day: 0.25    Years: 7.00    Types: Cigarettes  . Smokeless tobacco: Former Neurosurgeon     Comment: 3 cigs/day, has used vape pens  . Alcohol use No     Allergies   Patient has no known allergies.   Review of Systems Review of Systems  Constitutional: Negative for fever.  HENT: Negative for rhinorrhea and sore throat.   Eyes: Negative for visual disturbance.  Respiratory: Positive for cough, sputum production and shortness of breath.   Cardiovascular: Positive for chest pain, orthopnea and PND. Negative for leg swelling and syncope.  Gastrointestinal: Negative for abdominal pain and vomiting.  Genitourinary: Negative for difficulty urinating.  Musculoskeletal: Positive for back pain.  Allergic/Immunologic: Negative for immunocompromised state.  Neurological: Negative for headaches.  Hematological: Does not bruise/bleed easily.  All other systems reviewed and are negative.    Physical Exam Updated Vital Signs BP (!) 124/98 (BP Location: Left Arm)   Pulse 93   Temp 97.5 F (36.4 C) (Oral)   Resp (!) 24   Ht 5\' 4"  (1.626 m)   Wt 74.2 kg (163 lb 9.6 oz)   LMP 02/08/2017 (Within Days) Comment: pt started bleeding right after her pregnancy and hasn't stopped  SpO2 94%   BMI 28.08 kg/m   Physical Exam Physical Exam  Nursing note and vitals reviewed. Constitutional: Well developed, well nourished, non-toxic, and in no acute distress Head:  Normocephalic and atraumatic.  Mouth/Throat: Oropharynx is clear and moist.  Neck: Normal range of motion. Neck supple.  Cardiovascular: Normal rate and regular rhythm.   Pulmonary/Chest: Effort normal and breath sounds normal.  Abdominal: Soft. There is no tenderness. There is no rebound and no guarding.  Musculoskeletal: Normal range of motion. Trace pedal edema.  Neurological: Alert, no facial droop, fluent speech, moves all extremities symmetrically, PERRL Skin: Skin is warm and dry.  Psychiatric: Cooperative   ED Treatments / Results  Labs (all labs ordered are listed, but only abnormal results are displayed) Labs Reviewed  CBC WITH DIFFERENTIAL/PLATELET - Abnormal; Notable for the following:       Result Value   Hemoglobin 10.4 (*)    HCT 32.8 (*)    MCV 73.1 (*)    MCH 23.2 (*)    RDW 17.2 (*)    All other components within normal limits  COMPREHENSIVE METABOLIC PANEL - Abnormal; Notable for the following:    Creatinine, Ser 1.03 (*)    Albumin 3.4 (*)    ALT 9 (*)  All other components within normal limits  BRAIN NATRIURETIC PEPTIDE - Abnormal; Notable for the following:    B Natriuretic Peptide 2,859.4 (*)    All other components within normal limits  URINALYSIS, ROUTINE W REFLEX MICROSCOPIC - Abnormal; Notable for the following:    APPearance CLOUDY (*)    Specific Gravity, Urine <1.005 (*)    Hgb urine dipstick LARGE (*)    Protein, ur 30 (*)    Leukocytes, UA SMALL (*)    All other components within normal limits  URINALYSIS, MICROSCOPIC (REFLEX) - Abnormal; Notable for the following:    Bacteria, UA RARE (*)    Squamous Epithelial / LPF 0-5 (*)    All other components within normal limits  PROTEIN / CREATININE RATIO, URINE  I-STAT TROPOININ, ED  POCT I-STAT TROPONIN I    EKG  EKG Interpretation  Date/Time:  Sunday March 08 2017 17:48:36 EDT Ventricular Rate:  94 PR Interval:    QRS Duration: 110 QT Interval:  423 QTC Calculation: 529 R  Axis:   86 Text Interpretation:  Sinus rhythm Probable left atrial enlargement Anteroseptal infarct, old Repol abnrm, prob ischemia, anterolateral lds Prolonged QT interval inferior and anterolateral T-wave changes new Confirmed by Crista Curb [phone]) on 03/08/2017 5:54:24 PM       Radiology Dg Chest 2 View  Result Date: 03/08/2017 CLINICAL DATA:  31 y/o  F; SOB, recent PNA EXAM: CHEST  2 VIEW COMPARISON:  02/16/2017 chest radiograph and chest CT. FINDINGS: Stable moderate to severe cardiomegaly. Interval improvement in appy central airspace opacities with minimal residual. No pleural effusion or pneumothorax. Bones are unremarkable. IMPRESSION: Interval improvement in patchy central airspace opacities compatible with sequelae of/resolving pneumonia. Electronically Signed   By: Mitzi Hansen M.D.   On: 03/08/2017 13:50   Ct Angio Chest Pe W Or Wo Contrast  Result Date: 03/08/2017 CLINICAL DATA:  Chest pain, shortness of breath, cough.  Postpartum. EXAM: CT ANGIOGRAPHY CHEST WITH CONTRAST TECHNIQUE: Multidetector CT imaging of the chest was performed using the standard protocol during bolus administration of intravenous contrast. Multiplanar CT image reconstructions and MIPs were obtained to evaluate the vascular anatomy. CONTRAST:  100 cc Isovue 370 intravenously. COMPARISON:  Chest radiograph 03/08/2017 FINDINGS: Cardiovascular: Satisfactory opacification of the pulmonary arteries to the segmental level. No evidence of pulmonary embolism. Enlarged heart. No pericardial effusion. Reflux of contrast to the hepatic veins. Mediastinum/Nodes: No enlarged mediastinal, hilar, or axillary lymph nodes. Thyroid gland, trachea, and esophagus demonstrate no significant findings. Lungs/Pleura: Patchy ground-glass airspace consolidation in the right lower lobe, right upper lobe, left upper lobe, lingula. Small right pleural effusion. Mild interstitial pulmonary edema. Upper Abdomen: No acute abnormality.  Musculoskeletal: No chest wall abnormality. No acute or significant osseous findings. Review of the MIP images confirms the above findings. IMPRESSION: No evidence of pulmonary embolus. Marked cardiomegaly. Query dilated cardiomyopathy. Reflux of contrast into the hepatic veins, usually associated with right heart dysfunction. Cardiology consultation is recommended. Small right pleural effusion. Mild interstitial pulmonary edema. More confluent areas of ground-glass airspace opacity in both lungs may represent superimposed areas of infectious consolidation. Electronically Signed   By: Ted Mcalpine M.D.   On: 03/08/2017 20:29    Procedures Procedures (including critical care time)   EMERGENCY DEPARTMENT [place] CARDIAC EXAM "Study: Limited Ultrasound of the Heart and Pericardium"  INDICATIONS:Dyspnea Multiple views of the heart and pericardium were obtained in real-time with a multi-frequency probe.  PERFORMED WU:[id number] IMAGES ARCHIVED?: Yes LIMITATIONS:  None VIEWS USED: Parasternal long  axis, Parasternal short axis and Apical 4 chamber  INTERPRETATION: Cardiac activity present, Pericardial effusioin absent and Decreased contractility   Medications Ordered in ED Medications  iopamidol (ISOVUE-370) 76 % injection (not administered)  morphine 4 MG/ML injection 4 mg (4 mg Intravenous Given 03/08/17 1705)  iopamidol (ISOVUE-370) 76 % injection 100 mL (100 mLs Intravenous Contrast Given 03/08/17 1908)  furosemide (LASIX) injection 20 mg (20 mg Intravenous Given 03/08/17 2139)     Initial Impression / Assessment and Plan / ED Course  I have reviewed the triage vital signs and the nursing notes.  Pertinent labs & imaging results that were available during my care of the patient were reviewed by me and considered in my medical decision making (see chart for details).     Records reviewed. Patient with CT angiogram of the chest to evaluate for PE on May 28, that showed no evidence of PE but  multifocal pneumonia. Radiology also commented on cardiomegaly with interstitial edema. She has not had follow-up Echo for this.  She is new EKG changes showing anterolateral ischemia. Subsequently a repeat CTA performed to rule out PE. Negative for PE but she still shows cardiomegaly with interstitial edema. Some residual pneumonia, but radiology and felt that this was improved from before. She has elevated BNP of 2500, with first normal troponin. Bedside ultrasound was suggestive of decreased EF c/f peripartum cardiomyopathy. Given 20 mg IV lasix.   Cardiology consulted. Will admit to hospitalist service. Discussed with Dr. Toniann Fail  Final Clinical Impressions(s) / ED Diagnoses   Final diagnoses:  Acute on chronic systolic congestive heart failure (HCC)  Peripartum cardiomyopathy    New Prescriptions New Prescriptions   No medications on file     Lavera Guise, MD 03/08/17 2219    Lavera Guise, MD 03/08/17 2234

## 2017-03-08 NOTE — ED Notes (Signed)
[place]

## 2017-03-08 NOTE — ED Notes (Signed)
Writer accidentally clicked off the urine requisition, pt still needs urine collected.

## 2017-03-08 NOTE — H&P (Signed)
History and Physical    LARRY KNIPP JXB:[id number] DOB: 07/01/1986 DOA: 03/08/2017  PCP: Department, [facility]  Patient coming from: Home.  Chief Complaint: Shortness of breath.  HPI: Lisa Vazquez is a 31 y.o. female with hypertension and polysubstance abuse who is postpartum 1 month now presents to ER with complaints of increasing shortness of breath. Patient has been having increasing shortness of breath on exertion over the last 2 weeks. Patient also get short of breath on lying flat. Denies any shortness of breath at rest. Patient states she was treated for pneumonia after 1 week of postpartum with a 1 week of antibiotics.   ED Course: In the ER patient had CT angiogram of the chest which was negative for pulmonary embolism but did show cardiomegaly concerning for right heart failure. There also was confluent areas of consolidation concerning for pneumonia. Bedside echocardiogram done by the ER physician was found to have EF of around 35%. EKG was showing T-wave changes with QRS of 529 ms. BNP was 2859. Cardiologist was consulted. Patient was given Lasix 20 mg IV and admitted for acute CHF.  Review of Systems: As per HPI, rest all negative.   Past Medical History:  Diagnosis Date  . BV (bacterial vaginosis)   . Gestational diabetes    gestational  . Hypertension   . Kidney infection   . UTI (lower urinary tract infection)     Past Surgical History:  Procedure Laterality Date  . CESAREAN SECTION     x3  . CESAREAN SECTION WITH BILATERAL TUBAL LIGATION Bilateral 02/04/2017   Procedure: REPEAT CESAREAN SECTION WITH BILATERAL TUBAL LIGATION;  Surgeon:  Bing, MD;  Location: [facility] BIRTHING SUITES;  Service: Obstetrics;  Laterality: Bilateral;     reports that she has been smoking Cigarettes.  She has a 1.75 pack-year smoking history. She has quit using smokeless tobacco. She reports that she does not drink alcohol or use drugs.  No Known  Allergies  Family History  Problem Relation Age of Onset  . Hypertension Mother   . Heart disease Father   . Asthma Brother     Prior to Admission medications   Medication Sig Start Date End Date Taking? Authorizing Provider  acetaminophen (TYLENOL) 500 MG tablet Take 1,000 mg by mouth every 6 (six) hours as needed for mild pain or headache.   Yes [provider]  amLODipine (NORVASC) 10 MG tablet Take 1 tablet (10 mg total) by mouth daily. 02/17/17  Yes Lorne Skeens, MD  buprenorphine (SUBUTEX) 8 MG SUBL SL tablet Place 8 mg under the tongue daily.    Yes [provider]  ibuprofen (ADVIL,MOTRIN) 800 MG tablet Take 1 tablet (800 mg total) by mouth every 8 (eight) hours as needed. 02/06/17  Yes Thressa Sheller D, CNM  polyethylene glycol (MIRALAX / GLYCOLAX) packet Take 17 g by mouth daily. 02/16/17  Yes Lorne Skeens, MD    Physical Exam: Vitals:   03/08/17 1242 03/08/17 1635 03/08/17 1921 03/08/17 2140  BP: (!) 140/104 (!) 123/100 (!) 135/108 (!) 124/98  Pulse: 92 90 93 93  Resp: 20 (!) 24 19 (!) 24  Temp: 97.2 F (36.2 C) 97 F (36.1 C) 97.5 F (36.4 C)   TempSrc: Oral Oral Oral   SpO2: 100% 94% 93% 94%  Weight: 74.2 kg (163 lb 9.6 oz)     Height: 5\' 4"  (1.626 m)         Constitutional: Moderately built and nourished. Vitals:  03/08/17 1242 03/08/17 1635 03/08/17 1921 03/08/17 2140  BP: (!) 140/104 (!) 123/100 (!) 135/108 (!) 124/98  Pulse: 92 90 93 93  Resp: 20 (!) 24 19 (!) 24  Temp: 97.2 F (36.2 C) 97 F (36.1 C) 97.5 F (36.4 C)   TempSrc: Oral Oral Oral   SpO2: 100% 94% 93% 94%  Weight: 74.2 kg (163 lb 9.6 oz)     Height: 5\' 4"  (1.626 m)      Eyes: Anicteric no pallor. ENMT: No discharge from the ears eyes nose and mouth. Neck: JVD mildly elevated. No mass felt. Respiratory: No rhonchi or crepitations. Cardiovascular: S1-S2 heard no murmurs appreciated. Abdomen: Soft nontender bowel sounds present. No guarding or  rigidity. Musculoskeletal: No edema. No joint effusion. Skin: No rash. Skin appears warm. Neurologic: Alert awake oriented to time place and person. Moves all extremities. Psychiatric: Appears normal. Normal affect.   Labs on Admission: I have personally reviewed following labs and imaging studies  CBC:  Recent Labs Lab 03/08/17 1658  WBC 6.1  NEUTROABS 2.3  HGB 10.4*  HCT 32.8*  MCV 73.1*  PLT 317   Basic Metabolic Panel:  Recent Labs Lab 03/08/17 1658  NA 142  K 3.6  CL 106  CO2 27  GLUCOSE 98  BUN 8  CREATININE 1.03*  CALCIUM 9.1   GFR: Estimated Creatinine Clearance: 77.4 mL/min (A) (by C-G formula based on SCr of 1.03 mg/dL (H)). Liver Function Tests:  Recent Labs Lab 03/08/17 1658  AST 16  ALT 9*  ALKPHOS 63  BILITOT 0.4  PROT 6.8  ALBUMIN 3.4*   No results for input(s): LIPASE, AMYLASE in the last 168 hours. No results for input(s): AMMONIA in the last 168 hours. Coagulation Profile: No results for input(s): INR, PROTIME in the last 168 hours. Cardiac Enzymes: No results for input(s): CKTOTAL, CKMB, CKMBINDEX, TROPONINI in the last 168 hours. BNP (last 3 results) No results for input(s): PROBNP in the last 8760 hours. HbA1C: No results for input(s): HGBA1C in the last 72 hours. CBG: No results for input(s): GLUCAP in the last 168 hours. Lipid Profile: No results for input(s): CHOL, HDL, LDLCALC, TRIG, CHOLHDL, LDLDIRECT in the last 72 hours. Thyroid Function Tests: No results for input(s): TSH, T4TOTAL, FREET4, T3FREE, THYROIDAB in the last 72 hours. Anemia Panel: No results for input(s): VITAMINB12, FOLATE, FERRITIN, TIBC, IRON, RETICCTPCT in the last 72 hours. Urine analysis:    Component Value Date/Time   COLORURINE YELLOW 03/08/2017 1641   APPEARANCEUR CLOUDY (A) 03/08/2017 1641   LABSPEC <1.005 (L) 03/08/2017 1641   PHURINE 6.5 03/08/2017 1641   GLUCOSEU NEGATIVE 03/08/2017 1641   HGBUR LARGE (A) 03/08/2017 1641   BILIRUBINUR  NEGATIVE 03/08/2017 1641   BILIRUBINUR neg 10/01/2016 0950   KETONESUR NEGATIVE 03/08/2017 1641   PROTEINUR 30 (A) 03/08/2017 1641   UROBILINOGEN 0.2 10/01/2016 0950   UROBILINOGEN 0.2 07/29/2015 0857   NITRITE NEGATIVE 03/08/2017 1641   LEUKOCYTESUR SMALL (A) 03/08/2017 1641   Sepsis Labs: @LABRCNTIP (procalcitonin:4,lacticidven:4) )No results found for this or any previous visit (from the past 240 hour(s)).   Radiological Exams on Admission: Dg Chest 2 View  Result Date: 03/08/2017 CLINICAL DATA:  30 y/o  F; SOB, recent PNA EXAM: CHEST  2 VIEW COMPARISON:  02/16/2017 chest radiograph and chest CT. FINDINGS: Stable moderate to severe cardiomegaly. Interval improvement in appy central airspace opacities with minimal residual. No pleural effusion or pneumothorax. Bones are unremarkable. IMPRESSION: Interval improvement in patchy central airspace opacities compatible with  sequelae of/resolving pneumonia. Electronically Signed   By: Mitzi HansenLance  Furusawa-Stratton M.D.   On: 03/08/2017 13:50   Ct Angio Chest Pe W Or Wo Contrast  Result Date: 03/08/2017 CLINICAL DATA:  Chest pain, shortness of breath, cough.  Postpartum. EXAM: CT ANGIOGRAPHY CHEST WITH CONTRAST TECHNIQUE: Multidetector CT imaging of the chest was performed using the standard protocol during bolus administration of intravenous contrast. Multiplanar CT image reconstructions and MIPs were obtained to evaluate the vascular anatomy. CONTRAST:  100 cc Isovue 370 intravenously. COMPARISON:  Chest radiograph 03/08/2017 FINDINGS: Cardiovascular: Satisfactory opacification of the pulmonary arteries to the segmental level. No evidence of pulmonary embolism. Enlarged heart. No pericardial effusion. Reflux of contrast to the hepatic veins. Mediastinum/Nodes: No enlarged mediastinal, hilar, or axillary lymph nodes. Thyroid gland, trachea, and esophagus demonstrate no significant findings. Lungs/Pleura: Patchy ground-glass airspace consolidation in the  right lower lobe, right upper lobe, left upper lobe, lingula. Small right pleural effusion. Mild interstitial pulmonary edema. Upper Abdomen: No acute abnormality. Musculoskeletal: No chest wall abnormality. No acute or significant osseous findings. Review of the MIP images confirms the above findings. IMPRESSION: No evidence of pulmonary embolus. Marked cardiomegaly. Query dilated cardiomyopathy. Reflux of contrast into the hepatic veins, usually associated with right heart dysfunction. Cardiology consultation is recommended. Small right pleural effusion. Mild interstitial pulmonary edema. More confluent areas of ground-glass airspace opacity in both lungs may represent superimposed areas of infectious consolidation. Electronically Signed   By: Ted Mcalpineobrinka  Dimitrova M.D.   On: 03/08/2017 20:29    EKG: Independently reviewed. Normal sinus rhythm with T-wave changes in the anterolateral leads. QTC of 529 ms.  Assessment/Plan Principal Problem:   Acute CHF (congestive heart failure) (HCC) Active Problems:   Essential hypertension   CHF (congestive heart failure) (HCC)    1. 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 Appreciate cardiology consult. Lasix 20 mg IV was ordered in the ER and I have placed patient on Lasix 20 mg IV every 12. Check 2-D echo cycle cardiac markers check TSH. Further recommendations per cardiology. 2. Recently treated pneumonia - CT angiogram of the chest shows possible pneumonia. I have ordered procalcitonin levels. I have discussed with pharmacy and since patient has prolonged QTC avoiding Levaquin and Zithromax. Patient is on ceftriaxone for now. Check sputum cultures urine for Legionella and strep antigen. Follow blood cultures. 3. Hypertension on amlodipine. Patient states this was recently started 1 month ago after pregnancy. Follow blood pressure trends. 4. Normocytic normochromic anemia - follow anemia  panel. 5. History of polysubstance abuse on Subutek. Discussed with pharmacy to dose.  Patient states she does not breast-feed and is not planning to do so.   DVT prophylaxis: Lovenox. Code Status: Full code.  Family Communication: Patient's husband.  Disposition Plan: Home.  Consults called: Cardiology.  Admission status: Inpatient.    Eduard ClosKAKRAKANDY,Annabella Elford N. MD [facility] Pager [phone].  If 7PM-7AM, please contact night-coverage [email or web address] Password [email or web address]  03/08/2017, 10:50 PM

## 2017-03-08 NOTE — Consult Note (Signed)
Cardiology Consultation   Patient ID: Lisa Vazquez; [id number]; Feb 01, 1986   Admit date: 03/08/2017 Date of Consult: 03/08/2017  Referring Provider:  [facility]  Primary Care Provider: Department, [facility] Cardiologist: NA Electrophysiologist:  NA  Reason for Consultation: new-onset HF  History of Present Illness: Lisa Vazquez is a 31 y.o. female who is being seen today for the evaluation of new-onset HF at the request of the ED at [facility]. Pt is about a month post-partum; she has been noticing worsening SOB and pressure discomfort in the center of her chest for the past 2 weeks now. She was diagnosed and treated for PNA at the end of May and has completed treatment for that at this time. She has not felt better, so she presented to the ED for further evaluation. She says she does not get SOB at rest; she gets dyspneic with exertion, and also c/o not being able to breathe when she lays down flat to go to bed at night. She has not had much of an appetite but does say she notes early satiety. She has not noticed any significant weight gain; in fact she has been steadily losing weight since the birth of her child a few weeks ago. She does not c/o swelling in her legs. No prior h/o any cardiac problems.  Bedside U/S done in the [facility] ED, and per report of ED physician, the EF looks a little decreased (they estimate ~35-40%). CT scan was done to r/o PE and was read by radiology as "severe cardiomegaly". BNP is elevated, and there is evidence of pulm edema on CXR/CT.  Past Medical History:  Diagnosis Date  . BV (bacterial vaginosis)   . Gestational diabetes    gestational  . Hypertension   . Kidney infection   . UTI (lower urinary tract infection)     Past Surgical History:  Procedure Laterality Date  . CESAREAN SECTION     x3  . CESAREAN SECTION WITH BILATERAL TUBAL LIGATION Bilateral 02/04/2017   Procedure: REPEAT CESAREAN SECTION  WITH BILATERAL TUBAL LIGATION;  Surgeon: [place] Bing, MD;  Location: [facility] BIRTHING SUITES;  Service: Obstetrics;  Laterality: Bilateral;    No current facility-administered medications on file prior to encounter.    Current Outpatient Prescriptions on File Prior to Encounter  Medication Sig Dispense Refill  . acetaminophen (TYLENOL) 500 MG tablet Take 1,000 mg by mouth every 6 (six) hours as needed for mild pain or headache.    Marland Kitchen amLODipine (NORVASC) 10 MG tablet Take 1 tablet (10 mg total) by mouth daily. 30 tablet 0  . buprenorphine (SUBUTEX) 8 MG SUBL SL tablet Place 8 mg under the tongue daily.     Marland Kitchen ibuprofen (ADVIL,MOTRIN) 800 MG tablet Take 1 tablet (800 mg total) by mouth every 8 (eight) hours as needed. 30 tablet 0  . nitrofurantoin, macrocrystal-monohydrate, (MACROBID) 100 MG capsule Take 100 mg by mouth at bedtime.    Marland Kitchen oxyCODONE-acetaminophen (PERCOCET/ROXICET) 5-325 MG tablet Take 1-2 tablets by mouth every 4 (four) hours as needed for severe pain. 6 tablet 0  . polyethylene glycol (MIRALAX / GLYCOLAX) packet Take 17 g by mouth daily. 14 each 0  . Prenat w/o A Vit-FeFum-FePo-FA (CONCEPT OB) 130-92.4-1 MG CAPS Take 1 tablet by mouth daily. 30 capsule 12     Current Medications: . furosemide  20 mg Intravenous Once  . iopamidol        Infused Medications:   PRN Medications:  Allergies:   No Known Allergies  Social History:   The patient  reports that she has been smoking Cigarettes.  She has a 1.75 pack-year smoking history. She has quit using smokeless tobacco. She reports that she does not drink alcohol or use drugs.    Family History:   The patient's family history includes Asthma in her brother; Heart disease in her father; Hypertension in her mother.   ROS:  Please see the history of present illness.  All other ROS reviewed and negative.     Vital Signs: Blood pressure (!) 135/108, pulse 93, temperature 97.5 F (36.4 C), temperature source Oral, resp. rate  19, height 5\' 4"  (1.626 m), weight 74.2 kg (163 lb 9.6 oz), last menstrual period 02/08/2017, SpO2 93 %, unknown if currently breastfeeding.   PHYSICAL EXAM: General:  Well nourished, well developed, in no acute distress HEENT: normal Lymph: no adenopathy Neck: +JVD Endocrine:  No thryomegaly Vascular: No carotid bruits; DP pulses 2+ bilaterally Cardiac:  normal S1, S2; RRR; no murmur. Ext warm and well-perfused; tr bilateral pitting edema Lungs:  Bilateral insp/exp wheezes Abd: soft, nontender, no hepatosplenomegaly  Musculoskeletal:  No deformities, BUE and BLE strength normal and equal Skin: warm and dry  Neuro:  CNs 2-12 intact, no focal abnormalities noted Psych:  Normal affect   EKG:  03-08-17 NSR with LVH by voltage; ST changes in the precordium could be 2/2 LVH but this does represent a change from prior  Labs: No results for input(s): CKTOTAL, CKMB, TROPONINI in the last 72 hours.  Recent Labs  03/08/17 1707  TROPIPOC 0.04    Lab Results  Component Value Date   WBC 6.1 03/08/2017   HGB 10.4 (L) 03/08/2017   HCT 32.8 (L) 03/08/2017   MCV 73.1 (L) 03/08/2017   PLT 317 03/08/2017    Recent Labs Lab 03/08/17 1658  NA 142  K 3.6  CL 106  CO2 27  BUN 8  CREATININE 1.03*  CALCIUM 9.1  PROT 6.8  BILITOT 0.4  ALKPHOS 63  ALT 9*  AST 16  GLUCOSE 98   Lab Results  Component Value Date   CHOL 154 04/06/2013   HDL 72 04/06/2013   LDLCALC 74 04/06/2013   TRIG 42 04/06/2013   Lab Results  Component Value Date   DDIMER 0.41 12/01/2015   BNP 2859  Radiology/Studies:  Dg Chest 2 View  Result Date: 03/08/2017 CLINICAL DATA:  31 y/o  F; SOB, recent PNA EXAM: CHEST  2 VIEW COMPARISON:  02/16/2017 chest radiograph and chest CT. FINDINGS: Stable moderate to severe cardiomegaly. Interval improvement in appy central airspace opacities with minimal residual. No pleural effusion or pneumothorax. Bones are unremarkable. IMPRESSION: Interval improvement in patchy  central airspace opacities compatible with sequelae of/resolving pneumonia. Electronically Signed   By: Mitzi HansenLance  Furusawa-Stratton M.D.   On: 03/08/2017 13:50   Ct Angio Chest Pe W Or Wo Contrast  Result Date: 03/08/2017 CLINICAL DATA:  Chest pain, shortness of breath, cough.  Postpartum. EXAM: CT ANGIOGRAPHY CHEST WITH CONTRAST TECHNIQUE: Multidetector CT imaging of the chest was performed using the standard protocol during bolus administration of intravenous contrast. Multiplanar CT image reconstructions and MIPs were obtained to evaluate the vascular anatomy. CONTRAST:  100 cc Isovue 370 intravenously. COMPARISON:  Chest radiograph 03/08/2017 FINDINGS: Cardiovascular: Satisfactory opacification of the pulmonary arteries to the segmental level. No evidence of pulmonary embolism. Enlarged heart. No pericardial effusion. Reflux of contrast to the hepatic veins. Mediastinum/Nodes: No enlarged mediastinal, hilar, or  axillary lymph nodes. Thyroid gland, trachea, and esophagus demonstrate no significant findings. Lungs/Pleura: Patchy ground-glass airspace consolidation in the right lower lobe, right upper lobe, left upper lobe, lingula. Small right pleural effusion. Mild interstitial pulmonary edema. Upper Abdomen: No acute abnormality. Musculoskeletal: No chest wall abnormality. No acute or significant osseous findings. Review of the MIP images confirms the above findings. IMPRESSION: No evidence of pulmonary embolus. Marked cardiomegaly. Query dilated cardiomyopathy. Reflux of contrast into the hepatic veins, usually associated with right heart dysfunction. Cardiology consultation is recommended. Small right pleural effusion. Mild interstitial pulmonary edema. More confluent areas of ground-glass airspace opacity in both lungs may represent superimposed areas of infectious consolidation. Electronically Signed   By: Ted Mcalpine M.D.   On: 03/08/2017 20:29    ASSESSMENT AND PLAN:  1. Cardiomegaly with  clinical s/sx HF: clinical hx, exam, lab and imaging findings all c/w probable LV dysfunction. bedside TTE done by ED and gross assessment shows probable mild-moderate LV dysfunction; per ED MD the EF looks about 35-40%. Need a formal TTE for better assessment; we can get that tomorrow. Course of workup and treatment from a cardiac standpoint will really depend on the results of the TTE. If EF verified to be down, she will need guideline-directed HF medical tx as well as possible LHC.  In the short term, agree w/ diuresis w/ IV lasix. Would also supplement K as initial K 3.6 and will drop w/ IV diuresis.  We can make further recs after echo is done.  2. HTN: OK to cont pre-hospital regimen for now, but meds will be likely be changed/added if cardiomyopathy is diagnosed.  3. Abn EKG: changes may be due to LVH however are a change from prior. If EF is down, pt will likely need LHC for further assessment. Will await TTE as above   Thank you for the opportunity to participate in the care of this very pleasant patient. Will follow. Please call w/ questions.   Precious Reel, MD , [facility] 03/08/2017 9:19 PM

## 2017-03-09 ENCOUNTER — Inpatient Hospital Stay (HOSPITAL_COMMUNITY): Payer: Medicaid Other

## 2017-03-09 DIAGNOSIS — O903 Peripartum cardiomyopathy: Secondary | ICD-10-CM

## 2017-03-09 DIAGNOSIS — I360 Nonrheumatic tricuspid (valve) stenosis: Secondary | ICD-10-CM

## 2017-03-09 DIAGNOSIS — I509 Heart failure, unspecified: Secondary | ICD-10-CM

## 2017-03-09 LAB — CBC
HEMATOCRIT: 35 % — AB (ref 36.0–46.0)
HEMOGLOBIN: 11.1 g/dL — AB (ref 12.0–15.0)
MCH: 23 pg — AB (ref 26.0–34.0)
MCHC: 31.7 g/dL (ref 30.0–36.0)
MCV: 72.6 fL — AB (ref 78.0–100.0)
Platelets: 334 10*3/uL (ref 150–400)
RBC: 4.82 MIL/uL (ref 3.87–5.11)
RDW: 17.2 % — ABNORMAL HIGH (ref 11.5–15.5)
WBC: 7 10*3/uL (ref 4.0–10.5)

## 2017-03-09 LAB — RAPID URINE DRUG SCREEN, HOSP PERFORMED
Amphetamines: NOT DETECTED
BENZODIAZEPINES: NOT DETECTED
Barbiturates: NOT DETECTED
COCAINE: NOT DETECTED
Opiates: POSITIVE — AB
Tetrahydrocannabinol: NOT DETECTED

## 2017-03-09 LAB — PROCALCITONIN: PROCALCITONIN: 0.11 ng/mL

## 2017-03-09 LAB — BASIC METABOLIC PANEL
ANION GAP: 11 (ref 5–15)
BUN: 8 mg/dL (ref 6–20)
CHLORIDE: 103 mmol/L (ref 101–111)
CO2: 28 mmol/L (ref 22–32)
Calcium: 9.2 mg/dL (ref 8.9–10.3)
Creatinine, Ser: 0.99 mg/dL (ref 0.44–1.00)
GFR calc non Af Amer: >60 mL/min (ref >60)
Glucose, Bld: 135 mg/dL — ABNORMAL HIGH (ref 65–99)
POTASSIUM: 3.4 mmol/L — AB (ref 3.5–5.1)
Sodium: 142 mmol/L (ref 135–145)

## 2017-03-09 LAB — FERRITIN: FERRITIN: 25 ng/mL (ref 11–307)

## 2017-03-09 LAB — TSH: TSH: 0.199 u[IU]/mL — ABNORMAL LOW (ref 0.350–4.500)

## 2017-03-09 LAB — URINALYSIS, ROUTINE W REFLEX MICROSCOPIC
BACTERIA UA: NONE SEEN
BILIRUBIN URINE: NEGATIVE
Glucose, UA: NEGATIVE mg/dL
KETONES UR: NEGATIVE mg/dL
Nitrite: NEGATIVE
PROTEIN: 30 mg/dL — AB
Specific Gravity, Urine: 1.009 (ref 1.005–1.030)
pH: 7 (ref 5.0–8.0)

## 2017-03-09 LAB — MAGNESIUM: Magnesium: 1.8 mg/dL (ref 1.7–2.4)

## 2017-03-09 LAB — RETICULOCYTES
RBC.: 4.66 MIL/uL (ref 3.87–5.11)
RETIC CT PCT: 1.8 % (ref 0.4–3.1)
Retic Count, Absolute: 83.9 10*3/uL (ref 19.0–186.0)

## 2017-03-09 LAB — IRON AND TIBC
IRON: 36 ug/dL (ref 28–170)
SATURATION RATIOS: 9 % — AB (ref 10.4–31.8)
TIBC: 395 ug/dL (ref 250–450)
UIBC: 359 ug/dL

## 2017-03-09 LAB — FOLATE: Folate: 11 ng/mL (ref >5.9)

## 2017-03-09 LAB — PREGNANCY, URINE: Preg Test, Ur: NEGATIVE

## 2017-03-09 LAB — TROPONIN I
Troponin I: 0.09 ng/mL (ref <0.03)
Troponin I: 0.08 ng/mL (ref <0.03)

## 2017-03-09 LAB — STREP PNEUMONIAE URINARY ANTIGEN: Strep Pneumo Urinary Antigen: NEGATIVE

## 2017-03-09 LAB — T4, FREE: FREE T4: 0.99 ng/dL (ref 0.61–1.12)

## 2017-03-09 LAB — VITAMIN B12: Vitamin B-12: 1136 pg/mL — ABNORMAL HIGH (ref 180–914)

## 2017-03-09 MED ORDER — DEXTROSE 5 % IV SOLN
1.0000 g | INTRAVENOUS | Status: DC
  Administered 2017-03-09: 1 g via INTRAVENOUS
  Filled 2017-03-09: qty 10

## 2017-03-09 MED ORDER — MORPHINE SULFATE (PF) 4 MG/ML IV SOLN
0.5000 mg | Freq: Four times a day (QID) | INTRAVENOUS | Status: DC | PRN
  Administered 2017-03-09: 0.52 mg via INTRAVENOUS
  Administered 2017-03-09: 0.5 mg via INTRAVENOUS
  Filled 2017-03-09 (×2): qty 1

## 2017-03-09 MED ORDER — DOXYCYCLINE HYCLATE 100 MG PO TABS
100.0000 mg | ORAL_TABLET | Freq: Two times a day (BID) | ORAL | Status: DC
  Administered 2017-03-09: 100 mg via ORAL
  Filled 2017-03-09: qty 1

## 2017-03-09 MED ORDER — BUPRENORPHINE HCL-NALOXONE HCL 8-2 MG SL SUBL
1.0000 | SUBLINGUAL_TABLET | Freq: Every day | SUBLINGUAL | Status: DC
  Administered 2017-03-09: 1 via SUBLINGUAL
  Filled 2017-03-09: qty 1

## 2017-03-09 MED ORDER — ASPIRIN 81 MG PO CHEW
81.0000 mg | CHEWABLE_TABLET | Freq: Once | ORAL | Status: AC
  Administered 2017-03-09: 81 mg via ORAL
  Filled 2017-03-09: qty 1

## 2017-03-09 MED ORDER — POTASSIUM CHLORIDE CRYS ER 20 MEQ PO TBCR
40.0000 meq | EXTENDED_RELEASE_TABLET | Freq: Once | ORAL | Status: AC
  Administered 2017-03-09: 40 meq via ORAL
  Filled 2017-03-09: qty 2

## 2017-03-09 NOTE — Care Management Note (Signed)
Case Management Note  Patient Details  Name: Lisa Vazquez MRN: [id number] Date of Birth: 1986-02-15  Subjective/Objective:  [age] y/o f admitted w/CHF,possible PNA. From home.                  Action/Plan:d/c plan home.   Expected Discharge Date:                  Expected Discharge Plan:  Home/Self Care  In-House Referral:     Discharge planning Services  CM Consult  Post Acute Care Choice:    Choice offered to:     DME Arranged:    DME Agency:     HH Arranged:    HH Agency:     Status of Service:  In process, will continue to follow  If discussed at Long Length of Stay Meetings, dates discussed:    Additional Comments:  Lanier ClamMahabir, Deshawn Witty, RN 03/09/2017, 11:46 AM

## 2017-03-09 NOTE — Progress Notes (Addendum)
[facility] PROGRESS NOTE  Lisa Vazquez ZOX:[id number] DOB: 1986-07-30 DOA: 03/08/2017 PCP: Department, [facility]  Brief summary  31 y.o. female with hypertension and polysubstance abuse who is postpartum 1 month, recent pneumonia presents to ER with complaints of increasing shortness of breath, and dyspnea on exertion over the last 2 weeks.   ED Course: In the ER patient had CT angiogram of the chest which was negative for pulmonary embolism but did show cardiomegaly concerning for right heart failure. There also was confluent areas of consolidation concerning for pneumonia. Bedside echocardiogram done by the ER physician was found to have EF of around 35%. EKG was showing T-wave changes with QRS of 529 ms. BNP was 2859. Cardiologist was consulted. Patient was given Lasix 20 mg IV and admitted for acute CHF.  Assessment  and plan  New onset acute CHF. 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 Cont diuresis, pend echo. Further recommendations per cardiology.  Recently treated pneumonia - CT angiogram of the chest shows possible pneumonia. prolonged QTC avoiding Levaquin and Zithromax. Patient is on ceftriaxone, added doxy. pend cultures urine for Legionella and strep antigen. Follow blood cultures.  Hypertension on amlodipine. Patient states this was recently started 1 month ago after pregnancy. Follow blood pressure trends.  Normocytic normochromic anemia - follow anemia panel. History of polysubstance abuse on Subutek. Discussed with pharmacy to dose. TSH-0.19. Check Free t4   Code Status: full Family Communication: d/w patient, rn (indicate person spoken with, relationship, and if by phone, the number) Disposition Plan: home when stable    Consultants:  Cardiology   Procedures:  Pend TEE  Antibiotics:  Ceftriaxone, doxy 6/18>> (indicate start date, and stop date if known)  HPI/Subjective: Alert, reports cough, pleuritic chest pain. No distress    Objective: Vitals:   03/09/17 0020 03/09/17 0605  BP: (!) 129/106 (!) 129/94  Pulse: 84 74  Resp: 20 (!) 22  Temp: 98.3 F (36.8 C) 98.2 F (36.8 C)    Intake/Output Summary (Last 24 hours) at 03/09/17 0853 Last data filed at 03/09/17 0840  Gross per 24 hour  Intake              500 ml  Output             1600 ml  Net            -1100 ml   Filed Weights   03/08/17 1242 03/09/17 0020 03/09/17 0605  Weight: 74.2 kg (163 lb 9.6 oz) 72.6 kg (160 lb) 72.6 kg (160 lb 0.9 oz)    Exam:   General:  No distress   Cardiovascular: s1,s2 rrr  Respiratory: few crackles LL  Abdomen: soft, nt, nd   Musculoskeletal: no leg edema    Data Reviewed: Basic Metabolic Panel:  Recent Labs Lab 03/08/17 1658 03/09/17 0046  NA 142 142  K 3.6 3.4*  CL 106 103  CO2 27 28  GLUCOSE 98 135*  BUN 8 8  CREATININE 1.03* 0.99  CALCIUM 9.1 9.2  MG  --  1.8   Liver Function Tests:  Recent Labs Lab 03/08/17 1658  AST 16  ALT 9*  ALKPHOS 63  BILITOT 0.4  PROT 6.8  ALBUMIN 3.4*   No results for input(s): LIPASE, AMYLASE in the last 168 hours. No results for input(s): AMMONIA in the last 168 hours. CBC:  Recent Labs Lab 03/08/17 1658 03/09/17 0046  WBC 6.1 7.0  NEUTROABS 2.3  --   HGB 10.4* 11.1*  HCT 32.8*  35.0*  MCV 73.1* 72.6*  PLT 317 334   Cardiac Enzymes:  Recent Labs Lab 03/09/17 0046  TROPONINI 0.09*   BNP (last 3 results)  Recent Labs  03/08/17 1658  BNP 2,859.4*    ProBNP (last 3 results) No results for input(s): PROBNP in the last 8760 hours.  CBG: No results for input(s): GLUCAP in the last 168 hours.  No results found for this or any previous visit (from the past 240 hour(s)).   Studies: Dg Chest 2 View  Result Date: 03/08/2017 CLINICAL DATA:  31 y/o  F; SOB, recent PNA EXAM: CHEST  2 VIEW COMPARISON:  02/16/2017 chest radiograph and chest CT. FINDINGS: Stable moderate to severe cardiomegaly. Interval improvement in appy central  airspace opacities with minimal residual. No pleural effusion or pneumothorax. Bones are unremarkable. IMPRESSION: Interval improvement in patchy central airspace opacities compatible with sequelae of/resolving pneumonia. Electronically Signed   By: Mitzi HansenLance  Furusawa-Stratton M.D.   On: 03/08/2017 13:50   Ct Angio Chest Pe W Or Wo Contrast  Result Date: 03/08/2017 CLINICAL DATA:  Chest pain, shortness of breath, cough.  Postpartum. EXAM: CT ANGIOGRAPHY CHEST WITH CONTRAST TECHNIQUE: Multidetector CT imaging of the chest was performed using the standard protocol during bolus administration of intravenous contrast. Multiplanar CT image reconstructions and MIPs were obtained to evaluate the vascular anatomy. CONTRAST:  100 cc Isovue 370 intravenously. COMPARISON:  Chest radiograph 03/08/2017 FINDINGS: Cardiovascular: Satisfactory opacification of the pulmonary arteries to the segmental level. No evidence of pulmonary embolism. Enlarged heart. No pericardial effusion. Reflux of contrast to the hepatic veins. Mediastinum/Nodes: No enlarged mediastinal, hilar, or axillary lymph nodes. Thyroid gland, trachea, and esophagus demonstrate no significant findings. Lungs/Pleura: Patchy ground-glass airspace consolidation in the right lower lobe, right upper lobe, left upper lobe, lingula. Small right pleural effusion. Mild interstitial pulmonary edema. Upper Abdomen: No acute abnormality. Musculoskeletal: No chest wall abnormality. No acute or significant osseous findings. Review of the MIP images confirms the above findings. IMPRESSION: No evidence of pulmonary embolus. Marked cardiomegaly. Query dilated cardiomyopathy. Reflux of contrast into the hepatic veins, usually associated with right heart dysfunction. Cardiology consultation is recommended. Small right pleural effusion. Mild interstitial pulmonary edema. More confluent areas of ground-glass airspace opacity in both lungs may represent superimposed areas of infectious  consolidation. Electronically Signed   By: Ted Mcalpineobrinka  Dimitrova M.D.   On: 03/08/2017 20:29    Scheduled Meds: . amLODipine  10 mg Oral Daily  . buprenorphine-naloxone  1 tablet Sublingual Daily  . enoxaparin (LOVENOX) injection  40 mg Subcutaneous Q24H  . furosemide  20 mg Intravenous Q12H  . potassium chloride  40 mEq Oral Once   Continuous Infusions: . cefTRIAXone (ROCEPHIN)  IV 1 g (03/09/17 0801)    Principal Problem:   Acute CHF (congestive heart failure) (HCC) Active Problems:   Essential hypertension   CHF (congestive heart failure) (HCC)    Time spent: >35 minutes     Esperanza SheetsBURIEV, Asaiah Scarber N  [facility] Pager [phone]. If 7PM-7AM, please contact night-coverage at [email or web address], password [email or web address] 03/09/2017, 8:53 AM  LOS: 1 day    Addendum: hypokalemia. Replace, monitor Nickia Boesen N

## 2017-03-09 NOTE — Progress Notes (Signed)
  Pharmacy Antibiotic Note  Lisa IslamDominique M Vazquez is a 31 y.o. female admitted on 03/08/2017 with pneumonia.  Pharmacy has been consulted for rocephin dosing.  Plan: Rocephin 1 Gm IV q24h for CAP Completed outpt Levaquin course so no Zmax ordered Rx will sign off as no further adjustments are needed  Height: 5\' 4"  (162.6 cm) Weight: 160 lb 0.9 oz (72.6 kg) IBW/kg (Calculated) : 54.7  Temp (24hrs), Avg:97.6 F (36.4 C), Min:97 F (36.1 C), Max:98.3 F (36.8 C)   Recent Labs Lab 03/08/17 1658 03/09/17 0046  WBC 6.1 7.0  CREATININE 1.03* 0.99    Estimated Creatinine Clearance: 79.7 mL/min (by C-G formula based on SCr of 0.99 mg/dL).    No Known Allergies  Antimicrobials this admission: 6/18 rocephin >>    >>   Dose adjustments this admission:   Microbiology results:  BCx:   UCx:    Sputum:    MRSA PCR:   Thank you for allowing pharmacy to be a part of this patient's care.  Lorenza EvangelistGreen, Meleni Delahunt R 03/09/2017 6:48 AM

## 2017-03-09 NOTE — Progress Notes (Addendum)
CRITICAL VALUE ALERT   Critical Value:  Trop 0.09  Date & Time Notied:  03/09/17 01:40  Provider Notified: Toniann FailKakrakandy  Orders Received/Actions taken: New order for Aspirin received

## 2017-03-09 NOTE — Progress Notes (Signed)
Progress Note  Patient Name: Lisa Vazquez Date of Encounter: 03/09/2017  Primary Cardiologist: N/A  Subjective   31 year old female, new-onset HF. One month post -partum. Hx of hypertension and polysubstance abuse. Has been having symptoms of SOB with exertion and pressure discomfort to center of chest. She is receiving IV Rocephin for Pneumonia.   Pt has been complaining of back pain between her shoulder blades that is intermittent and sharp. Has received Morphine but it has not been helping. Otherwise she reports her SOB is much improved.  Inpatient Medications    Scheduled Meds: . amLODipine  10 mg Oral Daily  . buprenorphine-naloxone  1 tablet Sublingual Daily  . enoxaparin (LOVENOX) injection  40 mg Subcutaneous Q24H  . furosemide  20 mg Intravenous Q12H   Continuous Infusions: . cefTRIAXone (ROCEPHIN)  IV 1 g (03/09/17 0801)   PRN Meds: acetaminophen **OR** acetaminophen, morphine injection   Vital Signs    Vitals:   03/08/17 2140 03/08/17 2342 03/09/17 0020 03/09/17 0605  BP: (!) 124/98 (!) 143/98 (!) 129/106 (!) 129/94  Pulse: 93 88 84 74  Resp: (!) 24 (!) 21 20 (!) 22  Temp:  97.6 F (36.4 C) 98.3 F (36.8 C) 98.2 F (36.8 C)  TempSrc:  Oral Oral Oral  SpO2: 94% 96% 100% 95%  Weight:   160 lb (72.6 kg) 160 lb 0.9 oz (72.6 kg)  Height:   5\' 4"  (1.626 m)     Intake/Output Summary (Last 24 hours) at 03/09/17 0810 Last data filed at 03/09/17 1610  Gross per 24 hour  Intake              500 ml  Output              700 ml  Net             -200 ml   Filed Weights   03/08/17 1242 03/09/17 0020 03/09/17 0605  Weight: 163 lb 9.6 oz (74.2 kg) 160 lb (72.6 kg) 160 lb 0.9 oz (72.6 kg)    Telemetry    SR - Personally Reviewed   Physical Exam   GEN: Well nourished, well developed HEENT: normal  Neck: no JVD, carotid bruits, or masses Cardiac: RRR. no murmurs, rubs, or gallops,no edema. Intact distal pulses bilaterally.  Respiratory: clear to  auscultation bilaterally, normal work of breathing GI: soft, nontender, nondistended, + BS MS: no deformity or atrophy  Skin: warm and dry, no rash Neuro: Alert and Oriented x 3, Strength and sensation are intact Psych:   Full affect  Labs    Chemistry Recent Labs Lab 03/08/17 1658 03/09/17 0046  NA 142 142  K 3.6 3.4*  CL 106 103  CO2 27 28  GLUCOSE 98 135*  BUN 8 8  CREATININE 1.03* 0.99  CALCIUM 9.1 9.2  PROT 6.8  --   ALBUMIN 3.4*  --   AST 16  --   ALT 9*  --   ALKPHOS 63  --   BILITOT 0.4  --   GFRNONAA >60 >60  GFRAA >60 >60  ANIONGAP 9 11     Hematology Recent Labs Lab 03/08/17 1658 03/09/17 0046 03/09/17 0702  WBC 6.1 7.0  --   RBC 4.49 4.82 4.66  HGB 10.4* 11.1*  --   HCT 32.8* 35.0*  --   MCV 73.1* 72.6*  --   MCH 23.2* 23.0*  --   MCHC 31.7 31.7  --   RDW 17.2* 17.2*  --  PLT 317 334  --     Cardiac Enzymes Recent Labs Lab 03/09/17 0046  TROPONINI 0.09*    Recent Labs Lab 03/08/17 1707  TROPIPOC 0.04     BNP Recent Labs Lab 03/08/17 1658  BNP 2,859.4*     DDimer No results for input(s): DDIMER in the last 168 hours.   Radiology    Dg Chest 2 View  Result Date: 03/08/2017 CLINICAL DATA:  30 y/o  F; SOB, recent PNA EXAM: CHEST  2 VIEW COMPARISON:  02/16/2017 chest radiograph and chest CT. FINDINGS: Stable moderate to severe cardiomegaly. Interval improvement in appy central airspace opacities with minimal residual. No pleural effusion or pneumothorax. Bones are unremarkable. IMPRESSION: Interval improvement in patchy central airspace opacities compatible with sequelae of/resolving pneumonia. Electronically Signed   By: Mitzi Hansen M.D.   On: 03/08/2017 13:50   Ct Angio Chest Pe W Or Wo Contrast  Result Date: 03/08/2017 CLINICAL DATA:  Chest pain, shortness of breath, cough.  Postpartum. EXAM: CT ANGIOGRAPHY CHEST WITH CONTRAST TECHNIQUE: Multidetector CT imaging of the chest was performed using the standard  protocol during bolus administration of intravenous contrast. Multiplanar CT image reconstructions and MIPs were obtained to evaluate the vascular anatomy. CONTRAST:  100 cc Isovue 370 intravenously. COMPARISON:  Chest radiograph 03/08/2017 FINDINGS: Cardiovascular: Satisfactory opacification of the pulmonary arteries to the segmental level. No evidence of pulmonary embolism. Enlarged heart. No pericardial effusion. Reflux of contrast to the hepatic veins. Mediastinum/Nodes: No enlarged mediastinal, hilar, or axillary lymph nodes. Thyroid gland, trachea, and esophagus demonstrate no significant findings. Lungs/Pleura: Patchy ground-glass airspace consolidation in the right lower lobe, right upper lobe, left upper lobe, lingula. Small right pleural effusion. Mild interstitial pulmonary edema. Upper Abdomen: No acute abnormality. Musculoskeletal: No chest wall abnormality. No acute or significant osseous findings. Review of the MIP images confirms the above findings. IMPRESSION: No evidence of pulmonary embolus. Marked cardiomegaly. Query dilated cardiomyopathy. Reflux of contrast into the hepatic veins, usually associated with right heart dysfunction. Cardiology consultation is recommended. Small right pleural effusion. Mild interstitial pulmonary edema. More confluent areas of ground-glass airspace opacity in both lungs may represent superimposed areas of infectious consolidation. Electronically Signed   By: Ted Mcalpine M.D.   On: 03/08/2017 20:29    Cardiac Studies   Pending this admission: TEE  Patient Profile     Lisa WUBBEN is a 31 y.o. female who is being seen  for the evaluation of new-onset HF at the request of the ED at [facility].  Assessment & Plan    Net loss since Admission: -200 net loss, overall has diuresed 700 mls   Weight:  unchanged      Blood Pressure: 129/94           Hr: 70-90s  Labs:  Creatinine 0.39,    K 3.4    Na  142 ,  WBC 7.0,  Hbg 11.1          Troponin: 0.09   BNP: 2,859.4    1. Possible New onset heart failure: Her BNP is elevated to 2,859. She is having SOB and chest pressure, she is also being treated for pneumonia. CT angio of chest r/o PE but shows reflux of contrast into hepatic veins, concern for RH dysfunction.  The ER provider performed a bedside ultrasound and felt her EF to be decreased to 35-40%, a repeat TEE is pending this admission to verify. She is currently receiving 20 mg BID of IV lasix and has  diuresed 700 ml, net loss of 200 mls. Needs potassium replacement, K 3.4. Forty mEq ordered. --Her troponin elevated to 0.09 overnight x 1. No further values for trending available.  2. Hypertension: She is on her pre hospital regimen and is well controlled. Norvasc 10 mg. If we are concerned about heart failure she may benefit from starting a low dose BB.  3. Abn EKG:  NSR with LVH by voltage criteria, ST changes in precordium could be 2/2 LVH. If EF is down patient will most likely need LHC, consider RHC as well due to CT angio flindings of reflux of contrast into hepatic veins.      Signed, Dorthula MatasGREENE,TIFFANY G, PA-C  03/09/2017, 8:10 AM    Personally seen and examined. Agree with above.  Breathing better. Body hurts Lungs clear, RRR, no edema, alert  Acute heart failure  - await ECHO  - continue IV lasix  - KCL  - Would be a good candidate for CT of cors instead of cath if EF low  Demand ischemia  - in the setting of HF

## 2017-03-09 NOTE — Discharge Summary (Signed)
Physician Discharge Summary  Lisa Vazquez ZOX:[id number] DOB: 04-19-1986 DOA: 03/08/2017  PCP: Department, [facility]  Admit date: 03/08/2017 Discharge date: 03/09/2017  Time spent: <35 minutes  Recommendations for Outpatient Follow-up:  AMA  Discharge Diagnoses:  Principal Problem:   Acute CHF (congestive heart failure) (HCC) Active Problems:   Essential hypertension   CHF (congestive heart failure) (HCC)   Discharge Condition: AMA  Diet recommendation: [facility]  Filed Weights   03/08/17 1242 03/09/17 0020 03/09/17 0605  Weight: 74.2 kg (163 lb 9.6 oz) 72.6 kg (160 lb) 72.6 kg (160 lb 0.9 oz)    History of present illness:  31 y.o.femalewith hypertension and polysubstance abuse who is postpartum 1 month, recent pneumonia presents to ER with complaints of increasing shortness of breath, and dyspnea on exertion over the last 2 weeks.   ED Course:In the ER patient had CT angiogram of the chest which was negative for pulmonary embolism but did show cardiomegaly concerning for right heart failure. There also was confluent areas of consolidation concerning for pneumonia. Bedside echocardiogram done by the ER physician was found to have EF of around 35%. EKG was showing T-wave changes with QRS of 529 ms. BNP was 2859. Cardiologist was consulted. Patient was given Lasix 20 mg IV and admitted for acute CHF.  Hospital Course:  New onset acute CHF. 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 Cont diuresis, pend echo.  -patient left AMA  Recently treated pneumonia - CT angiogram of the chest shows possible pneumonia. prolonged QTC avoidingLevaquin and Zithromax. Patient is on ceftriaxone, added doxy. pend cultures urine for Legionella and strep antigen. Follow blood cultures. _patient left AMA  Hypertension on amlodipine. Patient states this was recently started 1 month ago after pregnancy. Follow blood pressure trends.  Normocytic normochromic anemia - follow  anemia panel. History of polysubstance abuse on Subutek.Discussed with pharmacy to dose. TSH-0.19. Check Free t4  Patient decided to leave AMA  Procedures:  echo (i.e. Studies not automatically included, echos, thoracentesis, etc; not x-rays)  Consultations:  Cardiology   Discharge Exam: Vitals:   03/09/17 0605 03/09/17 1347  BP: (!) 129/94 125/83  Pulse: 74 96  Resp: (!) 22 18  Temp: 98.2 F (36.8 C) 98.1 F (36.7 C)    General: no distress  Cardiovascular: AMA Respiratory: AMA  Discharge Instructions   Allergies as of 03/09/2017   No Known Allergies   Med Rec must be completed prior to using this SMARTLINK    No Known Allergies Follow-up Information    [facility]-EMERGENCY DEPT.   Specialty:  Emergency Medicine Contact information: [address] [place] [id number] [address] [address] [phone]           The results of significant diagnostics from this hospitalization (including imaging, microbiology, ancillary and laboratory) are listed below for reference.    Significant Diagnostic Studies: Dg Chest 2 View  Result Date: 03/08/2017 CLINICAL DATA:  31 y/o  F; SOB, recent PNA EXAM: CHEST  2 VIEW COMPARISON:  02/16/2017 chest radiograph and chest CT. FINDINGS: Stable moderate to severe cardiomegaly. Interval improvement in appy central airspace opacities with minimal residual. No pleural effusion or pneumothorax. Bones are unremarkable. IMPRESSION: Interval improvement in patchy central airspace opacities compatible with sequelae of/resolving pneumonia. Electronically Signed   By: Mitzi Hansen M.D.   On: 03/08/2017 13:50   Dg Chest 2 View  Result Date: 02/16/2017 CLINICAL DATA:  Five days postpartum, diffuse abdominal pain, constipation, and fever, pain started earlier today, history smoking, hypertension  EXAM: CHEST  2 VIEW COMPARISON:  11/05/2016 FINDINGS: Enlargement of cardiac silhouette.  Mediastinal contours and pulmonary vascularity normal. New RIGHT upper and RIGHT lower lobe infiltrates. LEFT lung clear. No pleural effusion or pneumothorax. Bones unremarkable. IMPRESSION: Enlargement of cardiac silhouette. New RIGHT lung infiltrates question pneumonia. Electronically Signed   By: Ulyses Southward M.D.   On: 02/16/2017 16:45   Dg Abdomen 1 View  Result Date: 02/16/2017 CLINICAL DATA:  Diffuse abdominal pain, constipation, and fever beginning today, 5 days postpartum EXAM: ABDOMEN - 1 VIEW COMPARISON:  None FINDINGS: Slightly increased stool in colon. No evidence of bowel obstruction or wall thickening. Few nonspecific minimally prominent small bowel loops in the LEFT upper quadrant. Osseous structures unremarkable. IMPRESSION: Nonspecific bowel gas pattern with slightly prominent stool in colon. Electronically Signed   By: Ulyses Southward M.D.   On: 02/16/2017 16:46   Ct Angio Chest Pe W Or Wo Contrast  Result Date: 03/08/2017 CLINICAL DATA:  Chest pain, shortness of breath, cough.  Postpartum. EXAM: CT ANGIOGRAPHY CHEST WITH CONTRAST TECHNIQUE: Multidetector CT imaging of the chest was performed using the standard protocol during bolus administration of intravenous contrast. Multiplanar CT image reconstructions and MIPs were obtained to evaluate the vascular anatomy. CONTRAST:  100 cc Isovue 370 intravenously. COMPARISON:  Chest radiograph 03/08/2017 FINDINGS: Cardiovascular: Satisfactory opacification of the pulmonary arteries to the segmental level. No evidence of pulmonary embolism. Enlarged heart. No pericardial effusion. Reflux of contrast to the hepatic veins. Mediastinum/Nodes: No enlarged mediastinal, hilar, or axillary lymph nodes. Thyroid gland, trachea, and esophagus demonstrate no significant findings. Lungs/Pleura: Patchy ground-glass airspace consolidation in the right lower lobe, right upper lobe, left upper lobe, lingula. Small right pleural effusion. Mild interstitial pulmonary  edema. Upper Abdomen: No acute abnormality. Musculoskeletal: No chest wall abnormality. No acute or significant osseous findings. Review of the MIP images confirms the above findings. IMPRESSION: No evidence of pulmonary embolus. Marked cardiomegaly. Query dilated cardiomyopathy. Reflux of contrast into the hepatic veins, usually associated with right heart dysfunction. Cardiology consultation is recommended. Small right pleural effusion. Mild interstitial pulmonary edema. More confluent areas of ground-glass airspace opacity in both lungs may represent superimposed areas of infectious consolidation. Electronically Signed   By: Ted Mcalpine M.D.   On: 03/08/2017 20:29   Ct Angio Chest Pe W Or Wo Contrast  Result Date: 02/16/2017 CLINICAL DATA:  31 year old female five days postpartum presenting with shortness of breath and chest pain. EXAM: CT ANGIOGRAPHY CHEST WITH CONTRAST TECHNIQUE: Multidetector CT imaging of the chest was performed using the standard protocol during bolus administration of intravenous contrast. Multiplanar CT image reconstructions and MIPs were obtained to evaluate the vascular anatomy. CONTRAST:  100 cc Isovue 370 COMPARISON:  Chest radiograph dated 02/16/2017 FINDINGS: Cardiovascular: There is moderate cardiomegaly with enlargement of the left ventricle. There is retrograde flow of contrast from the right atrium into the IVC consistent with a degree of right cardiac dysfunction. Correlation with echocardiogram recommended. There is no pericardial effusion. The thoracic aorta appears unremarkable. Evaluation of the pulmonary arteries is somewhat limited due to respiratory motion artifact. No central pulmonary artery embolus identified. Mediastinum/Nodes: Top-normal mediastinal lymph node in the prevascular space measure up to 11 mm in short axis. No hilar or mediastinal adenopathy. The esophagus is grossly unremarkable. Lungs/Pleura: Bilateral confluent and patchy areas of airspace  opacity most consistent with multifocal pneumonia. There is interlobular septal prominence likely representing a degree of interstitial edema. There is no pleural effusion or pneumothorax. The central airways are  patent. Upper Abdomen: No acute abnormality. Musculoskeletal: No chest wall abnormality. No acute or significant osseous findings. Small pockets of subcutaneous air in the anterior chest, likely iatrogenic. Review of the MIP images confirms the above findings. IMPRESSION: 1. No CT evidence of central pulmonary artery embolus. 2. Multifocal pneumonia. 3. Moderate cardiomegaly with enlargement of the left ventricle and findings of a degree of right cardiac dysfunction. Correlation with echocardiogram recommended. There is associated mild interstitial edema. Electronically Signed   By: Elgie CollardArash  Radparvar M.D.   On: 02/16/2017 19:17    Microbiology: No results found for this or any previous visit (from the past 240 hour(s)).   Labs: Basic Metabolic Panel:  Recent Labs Lab 03/08/17 1658 03/09/17 0046  NA 142 142  K 3.6 3.4*  CL 106 103  CO2 27 28  GLUCOSE 98 135*  BUN 8 8  CREATININE 1.03* 0.99  CALCIUM 9.1 9.2  MG  --  1.8   Liver Function Tests:  Recent Labs Lab 03/08/17 1658  AST 16  ALT 9*  ALKPHOS 63  BILITOT 0.4  PROT 6.8  ALBUMIN 3.4*   No results for input(s): LIPASE, AMYLASE in the last 168 hours. No results for input(s): AMMONIA in the last 168 hours. CBC:  Recent Labs Lab 03/08/17 1658 03/09/17 0046  WBC 6.1 7.0  NEUTROABS 2.3  --   HGB 10.4* 11.1*  HCT 32.8* 35.0*  MCV 73.1* 72.6*  PLT 317 334   Cardiac Enzymes:  Recent Labs Lab 03/09/17 0046 03/09/17 1214  TROPONINI 0.09* 0.08*   BNP: BNP (last 3 results)  Recent Labs  03/08/17 1658  BNP 2,859.4*    ProBNP (last 3 results) No results for input(s): PROBNP in the last 8760 hours.  CBG: No results for input(s): GLUCAP in the last 168 hours.     SignedEsperanza Sheets:  Avanelle Pixley  N  [facility] 03/09/2017, 7:03 PM

## 2017-03-09 NOTE — Progress Notes (Signed)
Pt insistent to leave AMA, says something has come up with her kids and she has to go.  MD notified.  Educated pt on risks of leaving AMA.  Informed her this is not recommended.  She still insists on leaving.

## 2017-03-09 NOTE — Progress Notes (Signed)
  Echocardiogram 2D Echocardiogram has been performed.  Lisa PartridgeBrooke S [name] 03/09/2017, 4:15 PM

## 2017-03-11 LAB — LEGIONELLA PNEUMOPHILA SEROGP 1 UR AG: L. pneumophila Serogp 1 Ur Ag: NEGATIVE

## 2017-03-14 LAB — CULTURE, BLOOD (ROUTINE X 2)
Culture: NO GROWTH
Culture: NO GROWTH
SPECIAL REQUESTS: ADEQUATE
Special Requests: ADEQUATE

## 2017-03-15 ENCOUNTER — Encounter (HOSPITAL_COMMUNITY): Payer: Self-pay | Admitting: Emergency Medicine

## 2017-03-15 ENCOUNTER — Emergency Department (HOSPITAL_COMMUNITY): Payer: Medicaid Other

## 2017-03-15 ENCOUNTER — Inpatient Hospital Stay (HOSPITAL_COMMUNITY)
Admission: EM | Admit: 2017-03-15 | Discharge: 2017-03-17 | DRG: 776 | Disposition: A | Discharge status: Home / Self Care | Payer: Medicaid Other | Attending: Internal Medicine | Admitting: Internal Medicine

## 2017-03-15 ENCOUNTER — Other Ambulatory Visit: Payer: Self-pay

## 2017-03-15 DIAGNOSIS — O8620 Urinary tract infection following delivery, unspecified: Secondary | ICD-10-CM | POA: Diagnosis present

## 2017-03-15 DIAGNOSIS — N3000 Acute cystitis without hematuria: Secondary | ICD-10-CM

## 2017-03-15 DIAGNOSIS — I110 Hypertensive heart disease with heart failure: Secondary | ICD-10-CM | POA: Diagnosis present

## 2017-03-15 DIAGNOSIS — I2510 Atherosclerotic heart disease of native coronary artery without angina pectoris: Secondary | ICD-10-CM | POA: Diagnosis present

## 2017-03-15 DIAGNOSIS — I5041 Acute combined systolic (congestive) and diastolic (congestive) heart failure: Secondary | ICD-10-CM | POA: Diagnosis present

## 2017-03-15 DIAGNOSIS — I509 Heart failure, unspecified: Secondary | ICD-10-CM

## 2017-03-15 DIAGNOSIS — O99285 Endocrine, nutritional and metabolic diseases complicating the puerperium: Secondary | ICD-10-CM | POA: Diagnosis present

## 2017-03-15 DIAGNOSIS — R0789 Other chest pain: Secondary | ICD-10-CM

## 2017-03-15 DIAGNOSIS — O9081 Anemia of the puerperium: Secondary | ICD-10-CM | POA: Diagnosis present

## 2017-03-15 DIAGNOSIS — O903 Peripartum cardiomyopathy: Principal | ICD-10-CM | POA: Diagnosis present

## 2017-03-15 DIAGNOSIS — Z79899 Other long term (current) drug therapy: Secondary | ICD-10-CM

## 2017-03-15 DIAGNOSIS — N390 Urinary tract infection, site not specified: Secondary | ICD-10-CM | POA: Diagnosis present

## 2017-03-15 DIAGNOSIS — O9943 Diseases of the circulatory system complicating the puerperium: Secondary | ICD-10-CM | POA: Diagnosis present

## 2017-03-15 DIAGNOSIS — E876 Hypokalemia: Secondary | ICD-10-CM | POA: Diagnosis present

## 2017-03-15 HISTORY — DX: Heart failure, unspecified: I50.9

## 2017-03-15 HISTORY — DX: Peripartum cardiomyopathy: O90.3

## 2017-03-15 LAB — DIFFERENTIAL
BASOS PCT: 0 %
Basophils Absolute: 0 10*3/uL (ref 0.0–0.1)
Eosinophils Absolute: 0.5 10*3/uL (ref 0.0–0.7)
Eosinophils Relative: 7 %
LYMPHS PCT: 50 %
Lymphs Abs: 3.4 10*3/uL (ref 0.7–4.0)
MONO ABS: 0.7 10*3/uL (ref 0.1–1.0)
MONOS PCT: 10 %
Neutro Abs: 2.2 10*3/uL (ref 1.7–7.7)
Neutrophils Relative %: 33 %

## 2017-03-15 LAB — RAPID URINE DRUG SCREEN, HOSP PERFORMED
AMPHETAMINES: NOT DETECTED
BENZODIAZEPINES: NOT DETECTED
Barbiturates: NOT DETECTED
Cocaine: NOT DETECTED
Opiates: POSITIVE — AB
TETRAHYDROCANNABINOL: NOT DETECTED

## 2017-03-15 LAB — URINALYSIS, ROUTINE W REFLEX MICROSCOPIC
Bilirubin Urine: NEGATIVE
GLUCOSE, UA: NEGATIVE mg/dL
Ketones, ur: NEGATIVE mg/dL
NITRITE: NEGATIVE
PH: 7 (ref 5.0–8.0)
PROTEIN: NEGATIVE mg/dL
SPECIFIC GRAVITY, URINE: 1.004 — AB (ref 1.005–1.030)

## 2017-03-15 LAB — HEPATIC FUNCTION PANEL
ALT: 9 U/L — AB (ref 14–54)
AST: 19 U/L (ref 15–41)
Albumin: 3.3 g/dL — ABNORMAL LOW (ref 3.5–5.0)
Alkaline Phosphatase: 55 U/L (ref 38–126)
BILIRUBIN DIRECT: 0.1 mg/dL (ref 0.1–0.5)
Indirect Bilirubin: 0.1 mg/dL — ABNORMAL LOW (ref 0.3–0.9)
Total Bilirubin: 0.2 mg/dL — ABNORMAL LOW (ref 0.3–1.2)
Total Protein: 6.4 g/dL — ABNORMAL LOW (ref 6.5–8.1)

## 2017-03-15 LAB — I-STAT TROPONIN, ED: Troponin i, poc: 0.04 ng/mL (ref 0.00–0.08)

## 2017-03-15 LAB — LIPASE, BLOOD: Lipase: 10 U/L — ABNORMAL LOW (ref 11–51)

## 2017-03-15 LAB — CBC
HCT: 33.6 % — ABNORMAL LOW (ref 36.0–46.0)
HEMATOCRIT: 32.7 % — AB (ref 36.0–46.0)
HEMOGLOBIN: 10.4 g/dL — AB (ref 12.0–15.0)
Hemoglobin: 10.6 g/dL — ABNORMAL LOW (ref 12.0–15.0)
MCH: 23.1 pg — ABNORMAL LOW (ref 26.0–34.0)
MCH: 22.6 pg — ABNORMAL LOW (ref 26.0–34.0)
MCHC: 31.8 g/dL (ref 30.0–36.0)
MCHC: 31.5 g/dL (ref 30.0–36.0)
MCV: 72.7 fL — AB (ref 78.0–100.0)
MCV: 71.8 fL — ABNORMAL LOW (ref 78.0–100.0)
Platelets: 267 10*3/uL (ref 150–400)
Platelets: 261 10*3/uL (ref 150–400)
RBC: 4.5 MIL/uL (ref 3.87–5.11)
RBC: 4.68 MIL/uL (ref 3.87–5.11)
RDW: 17.3 % — AB (ref 11.5–15.5)
RDW: 17.4 % — AB (ref 11.5–15.5)
WBC: 6.8 10*3/uL (ref 4.0–10.5)
WBC: 5.8 10*3/uL (ref 4.0–10.5)

## 2017-03-15 LAB — BASIC METABOLIC PANEL
Anion gap: 7 (ref 5–15)
BUN: 8 mg/dL (ref 6–20)
CO2: 27 mmol/L (ref 22–32)
Calcium: 8.9 mg/dL (ref 8.9–10.3)
Chloride: 105 mmol/L (ref 101–111)
Creatinine, Ser: 1.04 mg/dL — ABNORMAL HIGH (ref 0.44–1.00)
GFR calc Af Amer: >60 mL/min (ref >60)
GLUCOSE: 101 mg/dL — AB (ref 65–99)
POTASSIUM: 3.5 mmol/L (ref 3.5–5.1)
Sodium: 139 mmol/L (ref 135–145)

## 2017-03-15 LAB — CREATININE, SERUM
CREATININE: 0.99 mg/dL (ref 0.44–1.00)
GFR calc Af Amer: >60 mL/min (ref >60)

## 2017-03-15 LAB — PROTIME-INR
INR: 1.05
INR: 1.06
PROTHROMBIN TIME: 13.8 s (ref 11.4–15.2)
Prothrombin Time: 13.8 seconds (ref 11.4–15.2)

## 2017-03-15 LAB — I-STAT CG4 LACTIC ACID, ED: LACTIC ACID, VENOUS: 1.91 mmol/L — AB (ref 0.5–1.9)

## 2017-03-15 LAB — TSH: TSH: 0.75 u[IU]/mL (ref 0.350–4.500)

## 2017-03-15 LAB — TROPONIN I: TROPONIN I: 0.09 ng/mL — AB (ref <0.03)

## 2017-03-15 LAB — BRAIN NATRIURETIC PEPTIDE: B Natriuretic Peptide: 1580.3 pg/mL — ABNORMAL HIGH (ref 0.0–100.0)

## 2017-03-15 MED ORDER — CARVEDILOL 3.125 MG PO TABS
3.1250 mg | ORAL_TABLET | Freq: Two times a day (BID) | ORAL | Status: DC
  Administered 2017-03-15 – 2017-03-17 (×4): 3.125 mg via ORAL
  Filled 2017-03-15 (×4): qty 1

## 2017-03-15 MED ORDER — FUROSEMIDE 10 MG/ML IJ SOLN
20.0000 mg | Freq: Once | INTRAMUSCULAR | Status: AC
  Administered 2017-03-15: 20 mg via INTRAVENOUS
  Filled 2017-03-15: qty 4

## 2017-03-15 MED ORDER — ACETAMINOPHEN 650 MG RE SUPP: 650.0000 mg | Freq: Four times a day (QID) | RECTAL | Status: DC | PRN

## 2017-03-15 MED ORDER — DEXTROSE 5 % IV SOLN
1.0000 g | Freq: Once | INTRAVENOUS | Status: AC
  Administered 2017-03-15: 1 g via INTRAVENOUS
  Filled 2017-03-15: qty 10

## 2017-03-15 MED ORDER — ASPIRIN 81 MG PO CHEW
324.0000 mg | CHEWABLE_TABLET | Freq: Once | ORAL | Status: AC
  Administered 2017-03-15: 324 mg via ORAL
  Filled 2017-03-15: qty 4

## 2017-03-15 MED ORDER — ONDANSETRON HCL 4 MG PO TABS: 4.0000 mg | ORAL_TABLET | Freq: Four times a day (QID) | ORAL | Status: DC | PRN

## 2017-03-15 MED ORDER — HYDROCODONE-ACETAMINOPHEN 5-325 MG PO TABS
1.0000 | ORAL_TABLET | ORAL | Status: DC | PRN
  Administered 2017-03-15 – 2017-03-17 (×9): 2 via ORAL
  Filled 2017-03-15 (×9): qty 2

## 2017-03-15 MED ORDER — ACETAMINOPHEN 325 MG PO TABS: 650.0000 mg | ORAL_TABLET | Freq: Four times a day (QID) | ORAL | Status: DC | PRN

## 2017-03-15 MED ORDER — DEXTROSE 5 % IV SOLN
1.0000 g | Freq: Every day | INTRAVENOUS | Status: DC
  Administered 2017-03-16: 1 g via INTRAVENOUS
  Filled 2017-03-15 (×2): qty 10

## 2017-03-15 MED ORDER — HEPARIN SODIUM (PORCINE) 5000 UNIT/ML IJ SOLN
5000.0000 [IU] | Freq: Three times a day (TID) | INTRAMUSCULAR | Status: DC
  Administered 2017-03-16: 5000 [IU] via SUBCUTANEOUS
  Filled 2017-03-15 (×3): qty 1

## 2017-03-15 MED ORDER — ONDANSETRON HCL 4 MG/2ML IJ SOLN: 4.0000 mg | Freq: Four times a day (QID) | INTRAMUSCULAR | Status: DC | PRN

## 2017-03-15 MED ORDER — FUROSEMIDE 10 MG/ML IJ SOLN
40.0000 mg | Freq: Two times a day (BID) | INTRAMUSCULAR | Status: DC
  Administered 2017-03-15 – 2017-03-16 (×2): 40 mg via INTRAVENOUS
  Filled 2017-03-15 (×2): qty 4

## 2017-03-15 MED ORDER — SODIUM CHLORIDE 0.9% FLUSH
3.0000 mL | Freq: Two times a day (BID) | INTRAVENOUS | Status: DC
  Administered 2017-03-15 – 2017-03-17 (×5): 3 mL via INTRAVENOUS

## 2017-03-15 MED ORDER — MORPHINE SULFATE (PF) 4 MG/ML IV SOLN
4.0000 mg | Freq: Once | INTRAVENOUS | Status: AC
  Administered 2017-03-15: 4 mg via INTRAVENOUS
  Filled 2017-03-15: qty 1

## 2017-03-15 MED ORDER — DEXTROSE 5 % IV SOLN: 1.0000 g | Freq: Every day | INTRAVENOUS | Status: DC

## 2017-03-15 NOTE — ED Triage Notes (Signed)
Pt reports ongoing SOB and CP since discharge from hospital 6 days ago. Was admitted for CHF.

## 2017-03-15 NOTE — Progress Notes (Signed)
Rec'd pt from ED. Condition stable. Cont with plan of care. Discussed Heart failure s/sx. Will need cont reinforcement.

## 2017-03-15 NOTE — ED Provider Notes (Signed)
[facility]-EMERGENCY DEPT Provider Note   CSN: [id number] Arrival date & time: 03/15/17  4098     History   Chief Complaint Chief Complaint  Patient presents with  . Chest Pain  . Shortness of Breath    HPI Lisa Vazquez is a 31 y.o. female.  HPI Patient's postpartum from C-section [phone]. After delivery, patient was diagnosed with pneumonia and treated with amoxicillin and Levaquin. She was admitted to the hospital 6\17 6/18 with diagnosis of congestive heart failure postpartum cardiomyopathy and essential hypertension. EF 25%. Patient left AMA [id number]. She reports that she did not have known to care for her children and had to go. She reports she was not given any medications at the time she left the hospital. She reports she feels almost like she did when she went to the hospital maybe a little worse. She reports shortness of breath lying flat. Left-sided chest pain sharp and stabbing. Cough productive of green sputum. No fever and no lower extremity swelling or calf pain. Patient denies that she is currently smoking (patient does however seem to smell slightly of tobacco smoke). Past Medical History:  Diagnosis Date  . BV (bacterial vaginosis)   . CHF (congestive heart failure) (HCC)   . Gestational diabetes    gestational  . Hypertension   . Kidney infection   . UTI (lower urinary tract infection)     Patient Active Problem List   Diagnosis Date Noted  . Acute CHF (congestive heart failure) (HCC) 03/08/2017  . Essential hypertension 03/08/2017  . CHF (congestive heart failure) (HCC) 03/08/2017  . History of cesarean delivery 02/03/2017  . Pregnancy complicated by subutex maintenance, antepartum (HCC) 01/26/2017  . Gestational diabetes mellitus, antepartum 11/14/2016  . Pyelonephritis affecting pregnancy in third trimester 11/09/2016  . Marijuana use 07/26/2016  . 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 High blood hemoglobin F  (HCC) 07/26/2016  . History of recurrent UTIs 07/24/2016  . Supervision of high-risk pregnancy 07/17/2016  . Chronic hypertension during pregnancy, antepartum 07/17/2016  . CIN I (cervical intraepithelial neoplasia I) 04/06/2013  . Atypical squamous cell changes of undetermined significance (ASCUS) on cervical cytology with positive high risk human papilloma virus (HPV) 03/17/2013    Past Surgical History:  Procedure Laterality Date  . CESAREAN SECTION     x3  . CESAREAN SECTION WITH BILATERAL TUBAL LIGATION Bilateral 02/04/2017   Procedure: REPEAT CESAREAN SECTION WITH BILATERAL TUBAL LIGATION;  Surgeon: Andale Bing, MD;  Location: [facility] BIRTHING SUITES;  Service: Obstetrics;  Laterality: Bilateral;    OB History    Gravida Para Term Preterm AB Living   5 4 2 2 1 4    SAB TAB Ectopic Multiple Live Births     1 0 0 4       Home Medications    Prior to Admission medications   Medication Sig Start Date End Date Taking? Authorizing Provider  acetaminophen (TYLENOL) 500 MG tablet Take 1,000 mg by mouth every 6 (six) hours as needed for mild pain or headache.   Yes [provider]  amLODipine (NORVASC) 10 MG tablet Take 1 tablet (10 mg total) by mouth daily. 02/17/17  Yes Lorne Skeens, MD  buprenorphine (SUBUTEX) 8 MG SUBL SL tablet Place 8 mg under the tongue daily.    Yes [provider]  ibuprofen (ADVIL,MOTRIN) 800 MG tablet Take 1 tablet (800 mg total) by mouth every 8 (eight) hours as needed. 02/06/17  Yes Armando Reichert, CNM  polyethylene glycol (MIRALAX / GLYCOLAX) packet Take 17 g by mouth daily. Patient taking differently: Take 17 g by mouth daily as needed for mild constipation or moderate constipation.  02/16/17  Yes Lorne SkeensSchenk, Nicholas Michael, MD    Family History Family History  Problem Relation Age of Onset  . Hypertension Mother   . Heart disease Father   . Asthma Brother     Social History Social History  Substance Use Topics  .  Smoking status: Current Some Day Smoker    Packs/day: 0.25    Years: 7.00    Types: Cigarettes  . Smokeless tobacco: Former NeurosurgeonUser     Comment: 3 cigs/day, has used vape pens  . Alcohol use No     Allergies   Patient has no known allergies.   Review of Systems Review of Systems 10 Systems reviewed and are negative for acute change except as noted in the HPI.   Physical Exam Updated Vital Signs BP (!) 136/104 (BP Location: Right Arm)   Pulse 78   Temp 97.5 F (36.4 C) (Oral)   Resp 17   LMP 02/04/2017   SpO2 96%   Physical Exam  Constitutional: She is oriented to person, place, and time. She appears well-developed and well-nourished. No distress.  HENT:  Head: Normocephalic and atraumatic.  Mouth/Throat: Oropharynx is clear and moist.  Eyes: Conjunctivae and EOM are normal. Pupils are equal, round, and reactive to light.  Neck: Neck supple.  Cardiovascular: Normal rate, regular rhythm, normal heart sounds and intact distal pulses.   No murmur heard. Pulmonary/Chest: Effort normal and breath sounds normal. No respiratory distress.  Abdominal: Soft. She exhibits no distension. There is no tenderness.  Musculoskeletal: Normal range of motion. She exhibits no edema or tenderness.  He does not have any peripheral edema. Her calves are soft nontender. Note tenderness in the popliteal fossa. Skin condition excellent.  Neurological: She is alert and oriented to person, place, and time. No cranial nerve deficit. She exhibits normal muscle tone. Coordination normal.  Skin: Skin is warm and dry.  Psychiatric: She has a normal mood and affect.  Nursing note and vitals reviewed.    ED Treatments / Results  Labs (all labs ordered are listed, but only abnormal results are displayed) Labs Reviewed  BASIC METABOLIC PANEL - Abnormal; Notable for the following:       Result Value   Glucose, Bld 101 (*)    Creatinine, Ser 1.04 (*)    All other components within normal limits  CBC -  Abnormal; Notable for the following:    Hemoglobin 10.4 (*)    HCT 32.7 (*)    MCV 72.7 (*)    MCH 23.1 (*)    RDW 17.3 (*)    All other components within normal limits  LIPASE, BLOOD - Abnormal; Notable for the following:    Lipase 10 (*)    All other components within normal limits  BRAIN NATRIURETIC PEPTIDE - Abnormal; Notable for the following:    B Natriuretic Peptide 1,580.3 (*)    All other components within normal limits  TROPONIN I - Abnormal; Notable for the following:    Troponin I 0.09 (*)    All other components within normal limits  URINALYSIS, ROUTINE W REFLEX MICROSCOPIC - Abnormal; Notable for the following:    APPearance HAZY (*)    Specific Gravity, Urine 1.004 (*)    Hgb urine dipstick MODERATE (*)    Leukocytes, UA LARGE (*)    Bacteria, UA  RARE (*)    Squamous Epithelial / LPF 0-5 (*)    All other components within normal limits  RAPID URINE DRUG SCREEN, HOSP PERFORMED - Abnormal; Notable for the following:    Opiates POSITIVE (*)    All other components within normal limits  HEPATIC FUNCTION PANEL - Abnormal; Notable for the following:    Total Protein 6.4 (*)    Albumin 3.3 (*)    ALT 9 (*)    Total Bilirubin 0.2 (*)    Indirect Bilirubin 0.1 (*)    All other components within normal limits  I-STAT CG4 LACTIC ACID, ED - Abnormal; Notable for the following:    Lactic Acid, Venous 1.91 (*)    All other components within normal limits  CULTURE, BLOOD (ROUTINE X 2)  CULTURE, BLOOD (ROUTINE X 2)  PROTIME-INR  DIFFERENTIAL  I-STAT TROPOININ, ED    EKG  EKG Interpretation  Date/Time:  Sunday March 15 2017 10:03:11 EDT Ventricular Rate:  84 PR Interval:    QRS Duration: 111 QT Interval:  450 QTC Calculation: 532 R Axis:   -55 Text Interpretation:  Sinus rhythm Left atrial enlargement Left anterior fascicular block RSR' in V1 or V2, right VCD or RVH Abnormal T, consider ischemia, diffuse leads Prolonged QT interval Baseline wander in lead(s) II no  sig change from most recent previous Confirmed by Arby Barrette [phone]) on 03/15/2017 10:06:15 AM       Radiology Dg Chest 2 View  Result Date: 03/15/2017 CLINICAL DATA:  Pt reports ongoing SOB and CP since discharge from hospital 6 days ago. Was admitted for CHF. Hx of HTN and an ex-smoker. EXAM: CHEST  2 VIEW COMPARISON:  Chest x-ray dated 03/08/2017. FINDINGS: Stable cardiomegaly. Lungs are clear. No pleural effusion or pneumothorax seen. Osseous structures about the chest are unremarkable. IMPRESSION: No active cardiopulmonary disease. No evidence of pneumonia or pulmonary edema. Stable cardiomegaly, moderate to severe in degree. Electronically Signed   By: Bary Richard M.D.   On: 03/15/2017 10:40    Procedures Procedures (including critical care time)  Medications Ordered in ED Medications  cefTRIAXone (ROCEPHIN) 1 g in dextrose 5 % 50 mL IVPB (0 g Intravenous Stopped 03/15/17 1214)  morphine 4 MG/ML injection 4 mg (4 mg Intravenous Given 03/15/17 1228)  aspirin chewable tablet 324 mg (324 mg Oral Given 03/15/17 1228)  furosemide (LASIX) injection 20 mg (20 mg Intravenous Given 03/15/17 1228)     Initial Impression / Assessment and Plan / ED Course  I have reviewed the triage vital signs and the nursing notes.  Pertinent labs & imaging results that were available during my care of the patient were reviewed by me and considered in my medical decision making (see chart for details).      Patient reports she is willing to stay in the hospital for treatment at this time.  Consult: (12:25) Dr. Chilton Si for cardiology. Consult: [facility] for admission. Final Clinical Impressions(s) / ED Diagnoses   Final diagnoses:  Postpartum cardiomyopathy  Other chest pain  Patient presents after diagnosis of postpartum cardiomyopathy. Diagnostic evaluation and treatment was not completed as the patient left AMA about 6 days ago. She reports she continues to be symptomatic with  chest pain and shortness of breath. Fortunately, patient is alert and appropriate. She does not have respiratory distress at rest. No peripheral edema. Vital signs stable. Plan for cardiology consultation to resume treatment and care.  New Prescriptions New Prescriptions   No medications on file  Arby Barrette, MD 03/15/17 [phone]

## 2017-03-15 NOTE — ED Notes (Signed)
Will collect urine when pt. Voids. Nurses aware.  

## 2017-03-15 NOTE — H&P (Signed)
History and Physical    VERNETTA DIZDAREVIC Vazquez:[id number] DOB: 02-15-1986 DOA: 03/15/2017  PCP: Department, [facility]  Patient coming from: Home  Chief Complaint: SOB  HPI: Lisa Vazquez is a 31 y.o. female with medical history significant of recurrent UTIs and gestational diabetes, she is gravida 4 para 4, she gave birth through C-section on 02/04/2017 to a baby boy, after discharge from the hospital she developed SOB, orthopnea, cough with clear sputum since came to the hospital for further evaluation. She was admitted on the 16th but she left AMA then second day. 2-D echo was done before she left the hospital showed LVEF of 25%. She presented to the hospital again today with the same above-mentioned complaints. Initial evaluation in the ED showed elevated BNP, CXR showed cardiomegaly  ED Course:  Vitals: WNL Labs: UA showed pyuria Imaging: CXR showed cardiomegaly Interventions: Given Lasix  Review of Systems:  Constitutional: negative for anorexia, fevers and sweats Eyes: negative for irritation, redness and visual disturbance Ears, nose, mouth, throat, and face: negative for earaches, epistaxis, nasal congestion and sore throat Respiratory: negative for cough, dyspnea on exertion, sputum and wheezing Cardiovascular: Per history of present illness, no pitting edema Gastrointestinal: negative for abdominal pain, constipation, diarrhea, melena, nausea and vomiting Genitourinary:negative for dysuria, frequency and hematuria Hematologic/lymphatic: negative for bleeding, easy bruising and lymphadenopathy Musculoskeletal:negative for arthralgias, muscle weakness and stiff joints Neurological: negative for coordination problems, gait problems, headaches and weakness Endocrine: negative for diabetic symptoms including polydipsia, polyuria and weight loss Allergic/Immunologic: negative for anaphylaxis, hay fever and urticaria  Past Medical History:  Diagnosis Date    . BV (bacterial vaginosis)   . CHF (congestive heart failure) (HCC)   . Gestational diabetes    gestational  . Hypertension   . Kidney infection   . UTI (lower urinary tract infection)     Past Surgical History:  Procedure Laterality Date  . CESAREAN SECTION     x3  . CESAREAN SECTION WITH BILATERAL TUBAL LIGATION Bilateral 02/04/2017   Procedure: REPEAT CESAREAN SECTION WITH BILATERAL TUBAL LIGATION;  Surgeon:  Bing, MD;  Location: [facility] BIRTHING SUITES;  Service: Obstetrics;  Laterality: Bilateral;     reports that she has been smoking Cigarettes.  She has a 1.75 pack-year smoking history. She has quit using smokeless tobacco. She reports that she does not drink alcohol or use drugs.  No Known Allergies  Family History  Problem Relation Age of Onset  . Hypertension Mother   . Heart disease Father   . Asthma Brother     Prior to Admission medications   Medication Sig Start Date End Date Taking? Authorizing Provider  acetaminophen (TYLENOL) 500 MG tablet Take 1,000 mg by mouth every 6 (six) hours as needed for mild pain or headache.   Yes [provider]  amLODipine (NORVASC) 10 MG tablet Take 1 tablet (10 mg total) by mouth daily. 02/17/17  Yes Lorne Skeens, MD  buprenorphine (SUBUTEX) 8 MG SUBL SL tablet Place 8 mg under the tongue daily.    Yes [provider]  ibuprofen (ADVIL,MOTRIN) 800 MG tablet Take 1 tablet (800 mg total) by mouth every 8 (eight) hours as needed. 02/06/17  Yes Thressa Sheller D, CNM  polyethylene glycol (MIRALAX / GLYCOLAX) packet Take 17 g by mouth daily. Patient taking differently: Take 17 g by mouth daily as needed for mild constipation or moderate constipation.  02/16/17  Yes Lorne Skeens, MD    Physical Exam:  Vitals:  03/15/17 0957 03/15/17 1224  BP: 128/89 (!) 136/104  Pulse: (!) 42 78  Resp: 16 17  Temp: 97.5 F (36.4 C)   TempSrc: Oral   SpO2: 99% 96%    Constitutional: NAD, calm,  comfortable Eyes: PERRL, lids and conjunctivae normal ENMT: Mucous membranes are moist. Posterior pharynx clear of any exudate or lesions.Normal dentition.  Neck: normal, supple, no masses, no thyromegaly Respiratory: clear to auscultation bilaterally, no wheezing, no crackles. Normal respiratory effort. No accessory muscle use.  Cardiovascular: Regular rate and rhythm, no murmurs / rubs / gallops. No extremity edema. 2+ pedal pulses. No carotid bruits.  Abdomen: no tenderness, no masses palpated. No hepatosplenomegaly. Bowel sounds positive.  Musculoskeletal: no clubbing / cyanosis. No joint deformity upper and lower extremities. Good ROM, no contractures. Normal muscle tone.  Skin: no rashes, lesions, ulcers. No induration Neurologic: CN 2-12 grossly intact. Sensation intact, DTR normal. Strength 5/5 in all 4.  Psychiatric: Normal judgment and insight. Alert and oriented x 3. Normal mood.   Labs on Admission: I have personally reviewed following labs and imaging studies  CBC:  Recent Labs Lab 03/08/17 1658 03/09/17 0046 03/15/17 1008  WBC 6.1 7.0 6.8  NEUTROABS 2.3  --  2.2  HGB 10.4* 11.1* 10.4*  HCT 32.8* 35.0* 32.7*  MCV 73.1* 72.6* 72.7*  PLT 317 334 267   Basic Metabolic Panel:  Recent Labs Lab 03/08/17 1658 03/09/17 0046 03/15/17 1008  NA 142 142 139  K 3.6 3.4* 3.5  CL 106 103 105  CO2 27 28 27   GLUCOSE 98 135* 101*  BUN 8 8 8   CREATININE 1.03* 0.99 1.04*  CALCIUM 9.1 9.2 8.9  MG  --  1.8  --    GFR: Estimated Creatinine Clearance: 75.9 mL/min (A) (by C-G formula based on SCr of 1.04 mg/dL (H)). Liver Function Tests:  Recent Labs Lab 03/08/17 1658 03/15/17 1105  AST 16 19  ALT 9* 9*  ALKPHOS 63 55  BILITOT 0.4 0.2*  PROT 6.8 6.4*  ALBUMIN 3.4* 3.3*    Recent Labs Lab 03/15/17 1105  LIPASE 10*   No results for input(s): AMMONIA in the last 168 hours. Coagulation Profile:  Recent Labs Lab 03/15/17 1105  INR 1.05   Cardiac  Enzymes:  Recent Labs Lab 03/09/17 0046 03/09/17 1214 03/15/17 1105  TROPONINI 0.09* 0.08* 0.09*   BNP (last 3 results) No results for input(s): PROBNP in the last 8760 hours. HbA1C: No results for input(s): HGBA1C in the last 72 hours. CBG: No results for input(s): GLUCAP in the last 168 hours. Lipid Profile: No results for input(s): CHOL, HDL, LDLCALC, TRIG, CHOLHDL, LDLDIRECT in the last 72 hours. Thyroid Function Tests: No results for input(s): TSH, T4TOTAL, FREET4, T3FREE, THYROIDAB in the last 72 hours. Anemia Panel: No results for input(s): VITAMINB12, FOLATE, FERRITIN, TIBC, IRON, RETICCTPCT in the last 72 hours. Urine analysis:    Component Value Date/Time   COLORURINE YELLOW 03/15/2017 1246   APPEARANCEUR HAZY (A) 03/15/2017 1246   LABSPEC 1.004 (L) 03/15/2017 1246   PHURINE 7.0 03/15/2017 1246   GLUCOSEU NEGATIVE 03/15/2017 1246   HGBUR MODERATE (A) 03/15/2017 1246   BILIRUBINUR NEGATIVE 03/15/2017 1246   BILIRUBINUR neg 10/01/2016 0950   KETONESUR NEGATIVE 03/15/2017 1246   PROTEINUR NEGATIVE 03/15/2017 1246   UROBILINOGEN 0.2 10/01/2016 0950   UROBILINOGEN 0.2 07/29/2015 0857   NITRITE NEGATIVE 03/15/2017 1246   LEUKOCYTESUR LARGE (A) 03/15/2017 1246   Sepsis Labs: !!!!!!!!!!!!!!!!!!!!!!!!!!!!!!!!!!!!!!!!!!!! Invalid input(s): PROCALCITONIN, LACTICIDVEN Recent Results (from  the past 240 hour(s))  Culture, blood (Routine X 2) w Reflex to ID Panel     Status: None   Collection Time: 03/09/17  7:02 AM  Result Value Ref Range Status   Specimen Description BLOOD LEFT ANTECUBITAL  Final   Special Requests   Final    BOTTLES DRAWN AEROBIC ONLY Blood Culture adequate volume   Culture   Final    NO GROWTH 5 DAYS Performed at [facility], [address] [place], [place] [address]    Report Status 03/14/2017 FINAL  Final  Culture, blood (Routine X 2) w Reflex to ID Panel     Status: None   Collection Time: 03/09/17  7:02 AM  Result Value Ref Range  Status   Specimen Description BLOOD LEFT HAND  Final   Special Requests   Final    BOTTLES DRAWN AEROBIC ONLY Blood Culture adequate volume   Culture   Final    NO GROWTH 5 DAYS Performed at [facility], [address] [place], [place] [address]    Report Status 03/14/2017 FINAL  Final     Radiological Exams on Admission: Dg Chest 2 View  Result Date: 03/15/2017 CLINICAL DATA:  Pt reports ongoing SOB and CP since discharge from hospital 6 days ago. Was admitted for CHF. Hx of HTN and an ex-smoker. EXAM: CHEST  2 VIEW COMPARISON:  Chest x-ray dated 03/08/2017. FINDINGS: Stable cardiomegaly. Lungs are clear. No pleural effusion or pneumothorax seen. Osseous structures about the chest are unremarkable. IMPRESSION: No active cardiopulmonary disease. No evidence of pneumonia or pulmonary edema. Stable cardiomegaly, moderate to severe in degree. Electronically Signed   By: Bary RichardStan  Maynard M.D.   On: 03/15/2017 10:40    EKG: Independently reviewed.   Assessment/Plan Principal Problem:   Systolic and diastolic CHF, acute (HCC) Active Problems:   UTI (urinary tract infection)   Postpartum dilated cardiomyopathy   Acute systolic and diastolic CHF -Presented with SOB, orthopnea and elevated BNP. -Likely postpartum dilated cardiomyopathy, CXR showed cardiomegaly. -2-D echo done on 03/09/2017 showed LVEF of 25% with grade 1 diastolic dysfunction. -Started on 40 mg of Lasix twice a day. -Follow renal function closely, daily weight, intake/output and restrict fluid and salt intake. -Per EDP cardiology consulted, they will see patient in a.m.  Postpartum dilated cardiopathy -Started on Coreg, cardiology consulted. -Likely needs ACEI or ARB, per cardiology recommendation.  UTI -Her postpartum bleeding stopped 3 days ago. -UA showed pyuria started on Rocephin, continue, follow urine culture.   DVT prophylaxis: SQ Heparin Code Status: Full code Family Communication: Plan D/W  patient Disposition Plan: Home Consults called:  Admission status: Inpatient, telemetry   [facility]MA[facility],Damauri Minion A MD [facility] Pager [phone]  If 7PM-7AM, please contact night-coverage [email or web address] Password [email or web address]  03/15/2017, 3:59 PM

## 2017-03-16 DIAGNOSIS — I5041 Acute combined systolic (congestive) and diastolic (congestive) heart failure: Secondary | ICD-10-CM

## 2017-03-16 DIAGNOSIS — O903 Peripartum cardiomyopathy: Principal | ICD-10-CM

## 2017-03-16 LAB — BASIC METABOLIC PANEL
ANION GAP: 10 (ref 5–15)
BUN: 9 mg/dL (ref 6–20)
CO2: 29 mmol/L (ref 22–32)
Calcium: 9 mg/dL (ref 8.9–10.3)
Chloride: 101 mmol/L (ref 101–111)
Creatinine, Ser: 1.02 mg/dL — ABNORMAL HIGH (ref 0.44–1.00)
GFR calc Af Amer: >60 mL/min (ref >60)
GLUCOSE: 97 mg/dL (ref 65–99)
POTASSIUM: 3.4 mmol/L — AB (ref 3.5–5.1)
Sodium: 140 mmol/L (ref 135–145)

## 2017-03-16 LAB — CBC
HEMATOCRIT: 34.6 % — AB (ref 36.0–46.0)
HEMOGLOBIN: 11.1 g/dL — AB (ref 12.0–15.0)
MCH: 22.8 pg — ABNORMAL LOW (ref 26.0–34.0)
MCHC: 32.1 g/dL (ref 30.0–36.0)
MCV: 71 fL — ABNORMAL LOW (ref 78.0–100.0)
Platelets: 268 10*3/uL (ref 150–400)
RBC: 4.87 MIL/uL (ref 3.87–5.11)
RDW: 17.2 % — ABNORMAL HIGH (ref 11.5–15.5)
WBC: 5.8 10*3/uL (ref 4.0–10.5)

## 2017-03-16 LAB — URINE CULTURE: Culture: NO GROWTH

## 2017-03-16 LAB — HEMOGLOBIN A1C
Hgb A1c MFr Bld: 4.6 % — ABNORMAL LOW (ref 4.8–5.6)
Mean Plasma Glucose: 85 mg/dL

## 2017-03-16 MED ORDER — POTASSIUM CHLORIDE CRYS ER 10 MEQ PO TBCR
10.0000 meq | EXTENDED_RELEASE_TABLET | Freq: Every day | ORAL | Status: DC
  Administered 2017-03-16 – 2017-03-17 (×2): 10 meq via ORAL
  Filled 2017-03-16 (×2): qty 1

## 2017-03-16 MED ORDER — POTASSIUM CHLORIDE CRYS ER 20 MEQ PO TBCR
40.0000 meq | EXTENDED_RELEASE_TABLET | Freq: Once | ORAL | Status: AC
  Administered 2017-03-16: 40 meq via ORAL
  Filled 2017-03-16: qty 2

## 2017-03-16 MED ORDER — FERROUS SULFATE 325 (65 FE) MG PO TABS
325.0000 mg | ORAL_TABLET | Freq: Two times a day (BID) | ORAL | Status: DC
  Administered 2017-03-16 – 2017-03-17 (×2): 325 mg via ORAL
  Filled 2017-03-16 (×2): qty 1

## 2017-03-16 MED ORDER — POLYETHYLENE GLYCOL 3350 17 G PO PACK
17.0000 g | PACK | Freq: Every day | ORAL | Status: DC
  Administered 2017-03-16 – 2017-03-17 (×2): 17 g via ORAL
  Filled 2017-03-16: qty 1

## 2017-03-16 MED ORDER — DOCUSATE SODIUM 100 MG PO CAPS: 100.0000 mg | ORAL_CAPSULE | Freq: Two times a day (BID) | ORAL | Status: DC | PRN

## 2017-03-16 MED ORDER — FUROSEMIDE 20 MG PO TABS
20.0000 mg | ORAL_TABLET | Freq: Every day | ORAL | Status: DC
  Administered 2017-03-17: 20 mg via ORAL
  Filled 2017-03-16: qty 1

## 2017-03-16 NOTE — Consult Note (Signed)
Cardiology Consultation:   Patient ID: Lisa Vazquez; [id number]; 08-13-1986   Admit date: 03/15/2017 Date of Consult: 03/16/2017  [facility] Primary Cardiologist: Dr. Anne FuSkains Primary Electrophysiologist:  NA   Patient Profile:   Lisa Vazquez is a 31 y.o. female with a recent hx of post partum cardiomyopathy and acute systolic HF that [facility] who is being seen today for the evaluation of cardiomyopathy at the request of Dr. Arthor CaptainElmahi.  History of Present Illness:   Lisa Vazquez has returned for evaluation of acute systolic HF, she was originally admitted 03/08/17 for HF -she was a month post partum.  Increasing SOB and pressure in chest.  This had begun 2 weeks prior to that admit.  She did have PNA at the end of May that was treated.  Bedside u/s done in ER with EF 35-40%, CTA was done with neg PE and severe cardiomegaly noted.  BNP was elevated and pulm. Edema on CXR.   She was hypertensive as well.   EKG at that time with new TWI in lat leads.  Troponin at that time 0.09 and 0.08.  Blood cultures neg.  She did have echo 03/09/17 before she signed out AMA to take care of her kids.  EF was 25%, mild LVH, diffuse hypokinesis with inferolateral akinesis, LV cavity was moderately dilated. LV end diastolic pressure most likely 20 mmHg.  She had moderate MR, LA was moderately to severely dilated.  PA pk pressure 38 mmHG PR pressure 8 mmHg.  Yesterday pt presented to ER with increasing SOB and chest pain.  She was not discharged with med. Per pt.  She has orthopnea, lt sided chest pain sharp and stabbing, + productive cough with green sputum. No fever.    BNP 1580, Previous BNP was 2,859  troponin 0.09,  Na 140, K+ 3.4 Cr 1.02 today H/H 11.1/34/6 Tsh 0.750 CXR mod to severe cardiomegaly.      EKG SR with TWI II,III,AVF AND Lat leads similar to previous. Both EKGs reviewed by me personally.   Currently continues with  chest pain mid sternal through to back that comes and goes.is Voiding but no measurement.  Has been given lasix X 2 40 mg. Wt from yesterday has increased from 163 lbs to 341 per chart.  Wt at last discharge was 160 lbs. Pt said she weighed 156 lbs today.  She has orthopnea but she has been unable to walk very far due to DOE for some time.      Past Medical History:  Diagnosis Date  . BV (bacterial vaginosis)   . CHF (congestive heart failure) (HCC)   . Gestational diabetes    gestational  . Hypertension   . Kidney infection   . UTI (lower urinary tract infection)     Past Surgical History:  Procedure Laterality Date  . CESAREAN SECTION     x3  . CESAREAN SECTION WITH BILATERAL TUBAL LIGATION Bilateral 02/04/2017   Procedure: REPEAT CESAREAN SECTION WITH BILATERAL TUBAL LIGATION;  Surgeon: Basile BingPickens, Charlie, MD;  [facility];  Service: Obstetrics;  Laterality: Bilateral;     Inpatient Medications: Scheduled Meds: . carvedilol  3.125 mg Oral BID WC  . furosemide  40 mg Intravenous BID  . heparin  5,000 Units Subcutaneous Q8H  . sodium chloride flush  3 mL Intravenous Q12H   Continuous Infusions: . cefTRIAXone (ROCEPHIN)  IV     PRN Meds: acetaminophen **OR** acetaminophen, HYDROcodone-acetaminophen, ondansetron **OR**  ondansetron (ZOFRAN) IV  Allergies:   No Known Allergies  Social History:   Social History   Social History  . Marital status: Single    Spouse name: N/A  . Number of children: N/A  . Years of education: N/A   Occupational History  . Not on file.   Social History Main Topics  . Smoking status: Current Some Day Smoker    Packs/day: 0.25    Years: 7.00    Types: Cigarettes  . Smokeless tobacco: Former Neurosurgeon     Comment: 3 cigs/day, has used vape pens  . Alcohol use No  . Drug use: No     Comment: Subutex  . Sexual activity: Yes    Birth control/ protection: Condom   Other Topics Concern  . Not on file   Social History  Narrative  . No narrative on file    Family History:   The patient's family history includes Asthma in her brother; Heart disease in her father; Hypertension in her mother.  ROS:  Please see the history of present illness.  ROS  General:no colds or fevers, + weight increase Skin:no rashes or ulcers HEENT:no blurred vision, no congestion CV:see HPI occ palpitation PUL:see HPI GI:no diarrhea constipation or melena, no indigestion GU:no hematuria, no dysuria MS:no joint pain, no claudication Neuro:no syncope, no lightheadedness Endo:no diabetes though she did have gestational diabetes.  no thyroid disease   Physical Exam/Data:   Vitals:   03/15/17 1601 03/15/17 1646 03/15/17 2223 03/16/17 0601  BP: 126/78 118/81 108/65 (!) 106/53  Pulse: 76 81 75 77  Resp: 18  18 16   Temp:  98 F (36.7 C) 98.1 F (36.7 C) 97.5 F (36.4 C)  TempSrc:   Oral Oral  SpO2: 99% 99% 100% 99%  Weight:  163 lb 12.8 oz (74.3 kg)  (!) 341 lb 7.9 oz (154.9 kg)  Height:  5\' 4"  (1.626 m)      Intake/Output Summary (Last 24 hours) at 03/16/17 0814 Last data filed at 03/15/17 1700  Gross per 24 hour  Intake              480 ml  Output                0 ml  Net              480 ml   Filed Weights   03/15/17 1646 03/16/17 0601  Weight: 163 lb 12.8 oz (74.3 kg) (!) 341 lb 7.9 oz (154.9 kg)   Body mass index is 58.62 kg/m.  General:  Well nourished, well developed, in no acute distress sitting up in bed. HEENT: normal Lymph: no adenopathy Neck: no JVD Endocrine:  No thryomegaly Vascular: No carotid bruits;2+ pedal pulsed bil.  Cardiac:  normal S1, S2; RRR; no murmur gallup rub or click Lungs:  clear to auscultation bilaterally, no wheezing, rhonchi or rales  Abd: soft, nontender, no hepatomegaly  Ext: no edema Musculoskeletal:  No deformities, BUE and BLE strength normal and equal Skin: warm and dry  Neuro:  CNs 2-12 intact, no focal abnormalities noted Psych:  Normal affect   Telemetry:   Telemetry was personally reviewed and demonstrates:  SR to ST   Relevant CV Studies: Echo 03/09/17 Study Conclusions  - Left ventricle: The cavity size was moderately dilated. Wall   thickness was increased in a pattern of mild LVH. Diffuse   hypokinesis with inferolateral akinesis. The estimated ejection   fraction was 25%. Doppler parameters are consistent  with   restrictive physiology, indicative of decreased left ventricular   diastolic compliance and/or increased left atrial pressure.   E/medial e&' > 15, suggesting LV end diastolic pressure at least   20 mmHg. - Aortic valve: There was no stenosis. - Mitral valve: There was moderate regurgitation, suspect   functional. - Left atrium: The atrium was moderately to severely dilated. - Right ventricle: The cavity size was normal. Systolic function   was mildly reduced. - Tricuspid valve: Peak RV-RA gradient (S): 30 mm Hg. - Pulmonary arteries: PA peak pressure: 38 mm Hg (S). - Systemic veins: IVC measured 1.6 cm with < 50% respirophasic   variation, suggesting RA pressure 8 mmHg.  Impressions:  - Moderately dilated LV with mild LV hypertrophy. EF 25%, diffuse   hypokinesis with inferolateral akinesis. Restrictive diastolic   function with evidence for elevated LV filling pressure. Normal   RV size with mildly decreased systolic function. Moderate central   regurgitation, likely functional. Mild pulmonary hypertension.   CTA of chest 03/08/17 no PE.  IMPRESSION: No evidence of pulmonary embolus.  Marked cardiomegaly. Query dilated cardiomyopathy. Reflux of contrast into the hepatic veins, usually associated with right heart dysfunction. Cardiology consultation is recommended.  Small right pleural effusion.  Mild interstitial pulmonary edema.  More confluent areas of ground-glass airspace opacity in both lungs may represent superimposed areas of infectious consolidation.  Laboratory Data:  Chemistry Recent  Labs Lab 03/15/17 1008 03/15/17 1547 03/16/17 0624  NA 139  --  140  K 3.5  --  3.4*  CL 105  --  101  CO2 27  --  29  GLUCOSE 101*  --  97  BUN 8  --  9  CREATININE 1.04* 0.99 1.02*  CALCIUM 8.9  --  9.0  GFRNONAA >60 >60 >60  GFRAA >60 >60 >60  ANIONGAP 7  --  10     Recent Labs Lab 03/15/17 1105  PROT 6.4*  ALBUMIN 3.3*  AST 19  ALT 9*  ALKPHOS 55  BILITOT 0.2*   Hematology Recent Labs Lab 03/15/17 1008 03/15/17 1547 03/16/17 0624  WBC 6.8 5.8 5.8  RBC 4.50 4.68 4.87  HGB 10.4* 10.6* 11.1*  HCT 32.7* 33.6* 34.6*  MCV 72.7* 71.8* 71.0*  MCH 23.1* 22.6* 22.8*  MCHC 31.8 31.5 32.1  RDW 17.3* 17.4* 17.2*  PLT 267 261 268   Cardiac Enzymes Recent Labs Lab 03/09/17 1214 03/15/17 1105  TROPONINI 0.08* 0.09*    Recent Labs Lab 03/15/17 1019  TROPIPOC 0.04    BNP Recent Labs Lab 03/15/17 1105  BNP 1,580.3*    DDimer No results for input(s): DDIMER in the last 168 hours.  Radiology/Studies:  Dg Chest 2 View  Result Date: 03/15/2017 CLINICAL DATA:  Pt reports ongoing SOB and CP since discharge from hospital 6 days ago. Was admitted for CHF. Hx of HTN and an ex-smoker. EXAM: CHEST  2 VIEW COMPARISON:  Chest x-ray dated 03/08/2017. FINDINGS: Stable cardiomegaly. Lungs are clear. No pleural effusion or pneumothorax seen. Osseous structures about the chest are unremarkable. IMPRESSION: No active cardiopulmonary disease. No evidence of pneumonia or pulmonary edema. Stable cardiomegaly, moderate to severe in degree. Electronically Signed   By: Bary Richard M.D.   On: 03/15/2017 10:40    Assessment and Plan:   1. Dilated cardiomyopathy post partum.  Improving symptoms on diuretic. Now on coreg 3.125 mg BID,  Would add ACE or ARB once decision on cath is made.   2.  Acute systolic  HF on lasix 40 mg IV BID.  No I&O and wts do not seem correct.    3. Abnormal EKG may be due to cardiomyopathy  Troponin 0.09 Dr. Elease Hashimoto to see and decide on Rt and Lt cardiac  cath   4. UTI on ABX.     Signed, Nada Boozer, NP  03/16/2017 8:14 AM   Attending Note:   The patient was seen and examined.  Agree with assessment and plan as noted above.  Changes made to the above note as needed.  Patient seen and independently examined with Nada Boozer, NP.   We discussed all aspects of the encounter. I agree with the assessment and plan as stated above.  1.  Peripartum Cardiomyopathy:  Seems to be better. On Lasix 40 mg IV BID - will decease it to Lasix 20 mg PO a day  She has not significant edema .  On Coreg.   Will start Losartan 25 mg once BP increases a bit.  ( she is not nursing but is bottle feeding baby )   Hopefully will improved once we stop the IV lasix .  2.  Elevated Troponin levels.    The trend is fairly flat.    This is not c/w ACS.   ECG is not suggest ive of ACS. I would not push for cath at this point but would see how she does with medical therapy. If is continues to have symptoms of CP and does not make progress with her CHF,  She would likely need an ischemia work up .     I have spent a total of 40 minutes with patient reviewing hospital  notes , telemetry, EKGs, labs and examining patient as well as establishing an assessment and plan that was discussed with the patient. > 50% of time was spent in direct patient care.    Vesta Mixer, Montez Hageman., MD, [facility] 03/16/2017, 11:20 AM [address]  [address] Office [phone] Pager [phone]

## 2017-03-16 NOTE — Progress Notes (Signed)
PROGRESS NOTE    Lisa Vazquez  ZOX:[id number] DOB: 1985-10-22 DOA: 03/15/2017 PCP: Department, [facility]     Brief Narrative:  Lisa Vazquez is a 31 y.o. female with medical history significant of recurrent UTIs and gestational diabetes, she is gravida 4 para 4, she gave birth through C-section on 02/04/2017 to a baby boy. After discharge from the hospital she developed SOB, orthopnea, cough with clear sputum since came to the hospital for further evaluation. She was admitted on the 6/16, treated for postpartum CM with reduced EF 25%, pneumonia but she left AMA. She returns with continued SOB.   Assessment & Plan:   Principal Problem:   Systolic and diastolic CHF, acute (HCC) Active Problems:   UTI (urinary tract infection)   Postpartum dilated cardiomyopathy   Acute systolic and diastolic CHF -Presented with SOB, orthopnea and elevated BNP 1580 -Likely postpartum dilated cardiomyopathy, CXR showed cardiomegaly. -2-D echo done on 03/09/2017 showed LVEF of 25%, diffuse hypokinesis with inferolateral akinesis, restrictive diastolic function  -Cardiology consulted  -Lasix 40mg  BID started -Strict I/Os ordered, daily weights   Postpartum dilated cardiopathy -Started on coreg BID. Will need ACE/ARB pending cardiology decision   Elevated troponin -Trend. ?Heart cath. Pending cardiology decision   Hypokalemia  -Replace, trend   Microcytic anemia, iron deficient  -Ferrous sulfate  -Trend CBC   UTI, POA  -UA showed pyuria started on Rocephin, follow urine culture.    DVT prophylaxis: subq hep Code Status: full Family Communication: at bedside Disposition Plan: pending improvement   Consultants:   Cardiology  Procedures:   None  Antimicrobials:  Anti-infectives    Start     Dose/Rate Route Frequency Ordered Stop   03/16/17 1000  cefTRIAXone (ROCEPHIN) 1 g in dextrose 5 % 50 mL IVPB     1 g 100 mL/hr over 30 Minutes Intravenous  Daily 03/15/17 2106     03/15/17 2100  cefTRIAXone (ROCEPHIN) 1 g in dextrose 5 % 50 mL IVPB  Status:  Discontinued     1 g 100 mL/hr over 30 Minutes Intravenous Daily 03/15/17 2053 03/15/17 2106   03/15/17 1045  cefTRIAXone (ROCEPHIN) 1 g in dextrose 5 % 50 mL IVPB     1 g 100 mL/hr over 30 Minutes Intravenous  Once 03/15/17 1032 03/15/17 1214       Subjective: Continues to have shortness of breath. She also admits to sternal chest pain, sharp in nature that worsens with deep breaths and cough. She also has left sided chest pain under her breast, which also worsens with deep breaths. She admits to cough with clear sputum, no fevers at home, no nausea or vomiting, no abdominal pain. Tolerating meals.  Objective: Vitals:   03/15/17 1601 03/15/17 1646 03/15/17 2223 03/16/17 0601  BP: 126/78 118/81 108/65 (!) 106/53  Pulse: 76 81 75 77  Resp: 18  18 16   Temp:  98 F (36.7 C) 98.1 F (36.7 C) 97.5 F (36.4 C)  TempSrc:   Oral Oral  SpO2: 99% 99% 100% 99%  Weight:  74.3 kg (163 lb 12.8 oz)  (!) 154.9 kg (341 lb 7.9 oz)  Height:  5\' 4"  (1.626 m)      Intake/Output Summary (Last 24 hours) at 03/16/17 1101 Last data filed at 03/16/17 1017  Gross per 24 hour  Intake              702 ml  Output  100 ml  Net              602 ml   Filed Weights   03/15/17 1646 03/16/17 0601  Weight: 74.3 kg (163 lb 12.8 oz) (!) 154.9 kg (341 lb 7.9 oz)    Examination:  General exam: Appears calm and comfortable  Respiratory system: Clear to auscultation. Respiratory effort normal. Cardiovascular system: S1 & S2 heard, RRR. No JVD, murmurs, rubs, gallops or clicks. No pedal edema. Gastrointestinal system: Abdomen is nondistended, soft and nontender. No organomegaly or masses felt. Normal bowel sounds heard. Central nervous system: Alert and oriented. No focal neurological deficits. Extremities: Symmetric 5 x 5 power. Skin: No rashes, lesions or ulcers Psychiatry: Judgement and insight  appear normal. Mood & affect appropriate.   Data Reviewed: I have personally reviewed following labs and imaging studies  CBC:  Recent Labs Lab 03/15/17 1008 03/15/17 1547 03/16/17 0624  WBC 6.8 5.8 5.8  NEUTROABS 2.2  --   --   HGB 10.4* 10.6* 11.1*  HCT 32.7* 33.6* 34.6*  MCV 72.7* 71.8* 71.0*  PLT 267 261 268   Basic Metabolic Panel:  Recent Labs Lab 03/15/17 1008 03/15/17 1547 03/16/17 0624  NA 139  --  140  K 3.5  --  3.4*  CL 105  --  101  CO2 27  --  29  GLUCOSE 101*  --  97  BUN 8  --  9  CREATININE 1.04* 0.99 1.02*  CALCIUM 8.9  --  9.0   GFR: Estimated Creatinine Clearance: 118.5 mL/min (A) (by C-G formula based on SCr of 1.02 mg/dL (H)). Liver Function Tests:  Recent Labs Lab 03/15/17 1105  AST 19  ALT 9*  ALKPHOS 55  BILITOT 0.2*  PROT 6.4*  ALBUMIN 3.3*    Recent Labs Lab 03/15/17 1105  LIPASE 10*   No results for input(s): AMMONIA in the last 168 hours. Coagulation Profile:  Recent Labs Lab 03/15/17 1105 03/15/17 1547  INR 1.05 1.06   Cardiac Enzymes:  Recent Labs Lab 03/09/17 1214 03/15/17 1105  TROPONINI 0.08* 0.09*   BNP (last 3 results) No results for input(s): PROBNP in the last 8760 hours. HbA1C:  Recent Labs  03/15/17 1547  HGBA1C 4.6*   CBG: No results for input(s): GLUCAP in the last 168 hours. Lipid Profile: No results for input(s): CHOL, HDL, LDLCALC, TRIG, CHOLHDL, LDLDIRECT in the last 72 hours. Thyroid Function Tests:  Recent Labs  03/15/17 1547  TSH 0.750   Anemia Panel: No results for input(s): VITAMINB12, FOLATE, FERRITIN, TIBC, IRON, RETICCTPCT in the last 72 hours. Sepsis Labs:  Recent Labs Lab 03/15/17 1110  LATICACIDVEN 1.91*    Recent Results (from the past 240 hour(s))  Culture, blood (Routine X 2) w Reflex to ID Panel     Status: None   Collection Time: 03/09/17  7:02 AM  Result Value Ref Range Status   Specimen Description BLOOD LEFT ANTECUBITAL  Final   Special Requests    Final    BOTTLES DRAWN AEROBIC ONLY Blood Culture adequate volume   Culture   Final    NO GROWTH 5 DAYS Performed at [facility], [address] [place], [place] [address]    Report Status 03/14/2017 FINAL  Final  Culture, blood (Routine X 2) w Reflex to ID Panel     Status: None   Collection Time: 03/09/17  7:02 AM  Result Value Ref Range Status   Specimen Description BLOOD LEFT HAND  Final  Special Requests   Final    BOTTLES DRAWN AEROBIC ONLY Blood Culture adequate volume   Culture   Final    NO GROWTH 5 DAYS Performed at [facility], [address] [place], [place] [address]    Report Status 03/14/2017 FINAL  Final       Radiology Studies: Dg Chest 2 View  Result Date: 03/15/2017 CLINICAL DATA:  Pt reports ongoing SOB and CP since discharge from hospital 6 days ago. Was admitted for CHF. Hx of HTN and an ex-smoker. EXAM: CHEST  2 VIEW COMPARISON:  Chest x-ray dated 03/08/2017. FINDINGS: Stable cardiomegaly. Lungs are clear. No pleural effusion or pneumothorax seen. Osseous structures about the chest are unremarkable. IMPRESSION: No active cardiopulmonary disease. No evidence of pneumonia or pulmonary edema. Stable cardiomegaly, moderate to severe in degree. Electronically Signed   By: Bary Richard M.D.   On: 03/15/2017 10:40      Scheduled Meds: . carvedilol  3.125 mg Oral BID WC  . ferrous sulfate  325 mg Oral BID WC  . furosemide  40 mg Intravenous BID  . heparin  5,000 Units Subcutaneous Q8H  . polyethylene glycol  17 g Oral Daily  . potassium chloride  40 mEq Oral Once  . sodium chloride flush  3 mL Intravenous Q12H   Continuous Infusions: . cefTRIAXone (ROCEPHIN)  IV 1 g (03/16/17 1051)     LOS: 1 day    Time spent: 40 minutes   Noralee Stain, DO [facility] [email or web address] Password [email or web address] 03/16/2017, 11:01 AM

## 2017-03-17 LAB — BASIC METABOLIC PANEL
Anion gap: 9 (ref 5–15)
BUN: 9 mg/dL (ref 6–20)
CALCIUM: 9.2 mg/dL (ref 8.9–10.3)
CO2: 29 mmol/L (ref 22–32)
Chloride: 102 mmol/L (ref 101–111)
Creatinine, Ser: 0.94 mg/dL (ref 0.44–1.00)
GFR calc Af Amer: >60 mL/min (ref >60)
GLUCOSE: 98 mg/dL (ref 65–99)
Potassium: 3.7 mmol/L (ref 3.5–5.1)
Sodium: 140 mmol/L (ref 135–145)

## 2017-03-17 LAB — CBC
HCT: 35.7 % — ABNORMAL LOW (ref 36.0–46.0)
Hemoglobin: 11.4 g/dL — ABNORMAL LOW (ref 12.0–15.0)
MCH: 22.9 pg — ABNORMAL LOW (ref 26.0–34.0)
MCHC: 31.9 g/dL (ref 30.0–36.0)
MCV: 71.7 fL — ABNORMAL LOW (ref 78.0–100.0)
Platelets: 262 10*3/uL (ref 150–400)
RBC: 4.98 MIL/uL (ref 3.87–5.11)
RDW: 17.4 % — ABNORMAL HIGH (ref 11.5–15.5)
WBC: 6.6 10*3/uL (ref 4.0–10.5)

## 2017-03-17 MED ORDER — CARVEDILOL 3.125 MG PO TABS: 3.1250 mg | ORAL_TABLET | Freq: Two times a day (BID) | ORAL | 0 refills | Status: DC

## 2017-03-17 MED ORDER — FUROSEMIDE 20 MG PO TABS: 20.0000 mg | ORAL_TABLET | Freq: Every day | ORAL | 0 refills | Status: DC

## 2017-03-17 MED ORDER — LOSARTAN POTASSIUM 25 MG PO TABS: 25.0000 mg | ORAL_TABLET | Freq: Every day | ORAL | 0 refills | Status: DC

## 2017-03-17 MED ORDER — FERROUS SULFATE 325 (65 FE) MG PO TABS: 325.0000 mg | ORAL_TABLET | Freq: Two times a day (BID) | ORAL | 0 refills | Status: DC

## 2017-03-17 MED ORDER — POTASSIUM CHLORIDE CRYS ER 10 MEQ PO TBCR: 10.0000 meq | EXTENDED_RELEASE_TABLET | Freq: Every day | ORAL | 0 refills | Status: DC

## 2017-03-17 NOTE — Discharge Summary (Signed)
Physician Discharge Summary  CARROL HOUGLAND ZOX:[id number] DOB: 21-Mar-1986 DOA: 03/15/2017  PCP: Department, [facility]  Admit date: 03/15/2017 Discharge date: 03/17/2017  Admitted From: Home Disposition:  Home  Recommendations for Outpatient Follow-up:  1. Follow up with PCP in 1 week 2. Follow up with BMP in 1 week  3. Follow up with Cardiology as scheduled on 7/9   Discharge Condition: Stable CODE STATUS: Full  Diet recommendation: Heart healthy   Brief/Interim Summary: Lisa Vazquez a 31 y.o.femalewith medical history significant of recurrent UTIs and gestational diabetes, she is gravida 4 para 4, she gave birth through C-section on 02/04/2017 to a baby boy. After discharge from the hospital she developed SOB, orthopnea, cough with clear sputum since came to the hospital for further evaluation. She was admitted on the 6/16, treated for postpartum CM with reduced EF 25%, pneumonia but she left AMA. She returns with continued SOB.   Patient was treated with IV Lasix, transition to oral. She was evaluated by cardiology. She remained clinically stable. She is discharged with oral Lasix, potassium supplements, beta blocker as well as ARB. She will follow up with cardiology as outpatient.  Discharge Diagnoses:  Principal Problem:   Systolic and diastolic CHF, acute (HCC) Active Problems:   UTI (urinary tract infection)   Postpartum dilated cardiomyopathy   Acute systolic and diastolic CHF -Presented with SOB, orthopnea and elevated BNP 1580 -Likely postpartum dilated cardiomyopathy, CXR showed cardiomegaly. -2-D echo done on 03/09/2017 showed LVEF of 25%, diffuse hypokinesis with inferolateral akinesis, restrictive diastolic function  -Cardiology consulted  -Strict I/Os ordered, daily weights  -Oral lasix with potassium   Postpartum dilated cardiopathy -Coreg and ARB started   Elevated troponin -Flat, not ACS   Microcytic anemia, iron  deficient  -Ferrous sulfate  -Trend CBC   Pyuria  -Initially tx with Rocephin, urine culture negative, stop abx    Discharge Instructions  Discharge Instructions    (HEART FAILURE PATIENTS) Call MD:  Anytime you have any of the following symptoms: 1) 3 pound weight gain in 24 hours or 5 pounds in 1 week 2) shortness of breath, with or without a dry hacking cough 3) swelling in the hands, feet or stomach 4) if you have to sleep on extra pillows at night in order to breathe.    Complete by:  As directed    Call MD for:  difficulty breathing, headache or visual disturbances    Complete by:  As directed    Call MD for:  extreme fatigue    Complete by:  As directed    Call MD for:  persistant dizziness or light-headedness    Complete by:  As directed    Call MD for:  persistant nausea and vomiting    Complete by:  As directed    Call MD for:  severe uncontrolled pain    Complete by:  As directed    Call MD for:  temperature >100.4    Complete by:  As directed    Diet - low sodium heart healthy    Complete by:  As directed    Discharge instructions    Complete by:  As directed    You were cared for by a hospitalist during your hospital stay. If you have any questions about your discharge medications or the care you received while you were in the hospital after you are discharged, you can call the unit and asked to speak with the hospitalist on call if the hospitalist that  took care of you is not available. Once you are discharged, your primary care physician will handle any further medical issues. Please note that NO REFILLS for any discharge medications will be authorized once you are discharged, as it is imperative that you return to your primary care physician (or establish a relationship with a primary care physician if you do not have one) for your aftercare needs so that they can reassess your need for medications and monitor your lab values.   Increase activity slowly    Complete by:   As directed      Allergies as of 03/17/2017   No Known Allergies     Medication List    STOP taking these medications   amLODipine 10 MG tablet Commonly known as:  NORVASC   ibuprofen 800 MG tablet Commonly known as:  ADVIL,MOTRIN     TAKE these medications   acetaminophen 500 MG tablet Commonly known as:  TYLENOL Take 1,000 mg by mouth every 6 (six) hours as needed for mild pain or headache.   buprenorphine 8 MG Subl SL tablet Commonly known as:  SUBUTEX Place 8 mg under the tongue daily.   carvedilol 3.125 MG tablet Commonly known as:  COREG Take 1 tablet (3.125 mg total) by mouth 2 (two) times daily with a meal.   ferrous sulfate 325 (65 FE) MG tablet Take 1 tablet (325 mg total) by mouth 2 (two) times daily with a meal.   furosemide 20 MG tablet Commonly known as:  LASIX Take 1 tablet (20 mg total) by mouth daily. Start taking on:  03/18/2017   losartan 25 MG tablet Commonly known as:  COZAAR Take 1 tablet (25 mg total) by mouth daily.   polyethylene glycol packet Commonly known as:  MIRALAX / GLYCOLAX Take 17 g by mouth daily. What changed:  when to take this  reasons to take this   potassium chloride 10 MEQ tablet Commonly known as:  K-DUR,KLOR-CON Take 1 tablet (10 mEq total) by mouth daily. Start taking on:  03/18/2017      Follow-up Information    Department, [facility]. Schedule an appointment as soon as possible for a visit in 1 week(s).   Contact information: [address] [place] [address] [phone]          No Known Allergies  Consultations:  Cardiology    Procedures/Studies: Dg Chest 2 View  Result Date: 03/15/2017 CLINICAL DATA:  Pt reports ongoing SOB and CP since discharge from hospital 6 days ago. Was admitted for CHF. Hx of HTN and an ex-smoker. EXAM: CHEST  2 VIEW COMPARISON:  Chest x-ray dated 03/08/2017. FINDINGS: Stable cardiomegaly. Lungs are clear. No pleural effusion or pneumothorax seen.  Osseous structures about the chest are unremarkable. IMPRESSION: No active cardiopulmonary disease. No evidence of pneumonia or pulmonary edema. Stable cardiomegaly, moderate to severe in degree. Electronically Signed   By: Bary Richard M.D.   On: 03/15/2017 10:40   Dg Chest 2 View  Result Date: 03/08/2017 CLINICAL DATA:  31 y/o  F; SOB, recent PNA EXAM: CHEST  2 VIEW COMPARISON:  02/16/2017 chest radiograph and chest CT. FINDINGS: Stable moderate to severe cardiomegaly. Interval improvement in appy central airspace opacities with minimal residual. No pleural effusion or pneumothorax. Bones are unremarkable. IMPRESSION: Interval improvement in patchy central airspace opacities compatible with sequelae of/resolving pneumonia. Electronically Signed   By: Mitzi Hansen M.D.   On: 03/08/2017 13:50   Dg Chest 2 View  Result Date:  02/16/2017 CLINICAL DATA:  Five days postpartum, diffuse abdominal pain, constipation, and fever, pain started earlier today, history smoking, hypertension EXAM: CHEST  2 VIEW COMPARISON:  11/05/2016 FINDINGS: Enlargement of cardiac silhouette. Mediastinal contours and pulmonary vascularity normal. New RIGHT upper and RIGHT lower lobe infiltrates. LEFT lung clear. No pleural effusion or pneumothorax. Bones unremarkable. IMPRESSION: Enlargement of cardiac silhouette. New RIGHT lung infiltrates question pneumonia. Electronically Signed   By: Ulyses Southward M.D.   On: 02/16/2017 16:45   Dg Abdomen 1 View  Result Date: 02/16/2017 CLINICAL DATA:  Diffuse abdominal pain, constipation, and fever beginning today, 5 days postpartum EXAM: ABDOMEN - 1 VIEW COMPARISON:  None FINDINGS: Slightly increased stool in colon. No evidence of bowel obstruction or wall thickening. Few nonspecific minimally prominent small bowel loops in the LEFT upper quadrant. Osseous structures unremarkable. IMPRESSION: Nonspecific bowel gas pattern with slightly prominent stool in colon. Electronically Signed    By: Ulyses Southward M.D.   On: 02/16/2017 16:46   Ct Angio Chest Pe W Or Wo Contrast  Result Date: 03/08/2017 CLINICAL DATA:  Chest pain, shortness of breath, cough.  Postpartum. EXAM: CT ANGIOGRAPHY CHEST WITH CONTRAST TECHNIQUE: Multidetector CT imaging of the chest was performed using the standard protocol during bolus administration of intravenous contrast. Multiplanar CT image reconstructions and MIPs were obtained to evaluate the vascular anatomy. CONTRAST:  100 cc Isovue 370 intravenously. COMPARISON:  Chest radiograph 03/08/2017 FINDINGS: Cardiovascular: Satisfactory opacification of the pulmonary arteries to the segmental level. No evidence of pulmonary embolism. Enlarged heart. No pericardial effusion. Reflux of contrast to the hepatic veins. Mediastinum/Nodes: No enlarged mediastinal, hilar, or axillary lymph nodes. Thyroid gland, trachea, and esophagus demonstrate no significant findings. Lungs/Pleura: Patchy ground-glass airspace consolidation in the right lower lobe, right upper lobe, left upper lobe, lingula. Small right pleural effusion. Mild interstitial pulmonary edema. Upper Abdomen: No acute abnormality. Musculoskeletal: No chest wall abnormality. No acute or significant osseous findings. Review of the MIP images confirms the above findings. IMPRESSION: No evidence of pulmonary embolus. Marked cardiomegaly. Query dilated cardiomyopathy. Reflux of contrast into the hepatic veins, usually associated with right heart dysfunction. Cardiology consultation is recommended. Small right pleural effusion. Mild interstitial pulmonary edema. More confluent areas of ground-glass airspace opacity in both lungs may represent superimposed areas of infectious consolidation. Electronically Signed   By: Ted Mcalpine M.D.   On: 03/08/2017 20:29   Ct Angio Chest Pe W Or Wo Contrast  Result Date: 02/16/2017 CLINICAL DATA:  31 year old female five days postpartum presenting with shortness of breath and  chest pain. EXAM: CT ANGIOGRAPHY CHEST WITH CONTRAST TECHNIQUE: Multidetector CT imaging of the chest was performed using the standard protocol during bolus administration of intravenous contrast. Multiplanar CT image reconstructions and MIPs were obtained to evaluate the vascular anatomy. CONTRAST:  100 cc Isovue 370 COMPARISON:  Chest radiograph dated 02/16/2017 FINDINGS: Cardiovascular: There is moderate cardiomegaly with enlargement of the left ventricle. There is retrograde flow of contrast from the right atrium into the IVC consistent with a degree of right cardiac dysfunction. Correlation with echocardiogram recommended. There is no pericardial effusion. The thoracic aorta appears unremarkable. Evaluation of the pulmonary arteries is somewhat limited due to respiratory motion artifact. No central pulmonary artery embolus identified. Mediastinum/Nodes: Top-normal mediastinal lymph node in the prevascular space measure up to 11 mm in short axis. No hilar or mediastinal adenopathy. The esophagus is grossly unremarkable. Lungs/Pleura: Bilateral confluent and patchy areas of airspace opacity most consistent with multifocal pneumonia. There is interlobular septal  prominence likely representing a degree of interstitial edema. There is no pleural effusion or pneumothorax. The central airways are patent. Upper Abdomen: No acute abnormality. Musculoskeletal: No chest wall abnormality. No acute or significant osseous findings. Small pockets of subcutaneous air in the anterior chest, likely iatrogenic. Review of the MIP images confirms the above findings. IMPRESSION: 1. No CT evidence of central pulmonary artery embolus. 2. Multifocal pneumonia. 3. Moderate cardiomegaly with enlargement of the left ventricle and findings of a degree of right cardiac dysfunction. Correlation with echocardiogram recommended. There is associated mild interstitial edema. Electronically Signed   By: Elgie CollardArash  Radparvar M.D.   On: 02/16/2017  19:17       Discharge Exam: Vitals:   03/16/17 2127 03/17/17 0640  BP: 123/87 118/72  Pulse: 86 78  Resp: 16 16  Temp: 97.7 F (36.5 C) 97.6 F (36.4 C)   Vitals:   03/16/17 0601 03/16/17 1422 03/16/17 2127 03/17/17 0640  BP: (!) 106/53 110/60 123/87 118/72  Pulse: 77 72 86 78  Resp: 16 16 16 16   Temp: 97.5 F (36.4 C) 98.1 F (36.7 C) 97.7 F (36.5 C) 97.6 F (36.4 C)  TempSrc: Oral Oral Oral Oral  SpO2: 99% 99% 100% 100%  Weight: (!) 154.9 kg (341 lb 7.9 oz)   69.6 kg (153 lb 8 oz)  Height:        General: Pt is alert, awake, not in acute distress Cardiovascular: RRR, S1/S2 +, no rubs, no gallops, no edema  Respiratory: CTA bilaterally, no wheezing, no rhonchi Abdominal: Soft, NT, ND, bowel sounds + Extremities: no edema, no cyanosis    The results of significant diagnostics from this hospitalization (including imaging, microbiology, ancillary and laboratory) are listed below for reference.     Microbiology: Recent Results (from the past 240 hour(s))  Culture, blood (Routine X 2) w Reflex to ID Panel     Status: None   Collection Time: 03/09/17  7:02 AM  Result Value Ref Range Status   Specimen Description BLOOD LEFT ANTECUBITAL  Final   Special Requests   Final    BOTTLES DRAWN AEROBIC ONLY Blood Culture adequate volume   Culture   Final    NO GROWTH 5 DAYS Performed at [facility], [address] [place], [place] [address]    Report Status 03/14/2017 FINAL  Final  Culture, blood (Routine X 2) w Reflex to ID Panel     Status: None   Collection Time: 03/09/17  7:02 AM  Result Value Ref Range Status   Specimen Description BLOOD LEFT HAND  Final   Special Requests   Final    BOTTLES DRAWN AEROBIC ONLY Blood Culture adequate volume   Culture   Final    NO GROWTH 5 DAYS Performed at [facility], [address] [place], [place] [address]    Report Status 03/14/2017 FINAL  Final  Culture, blood (routine x 2)     Status: None (Preliminary  result)   Collection Time: 03/15/17 11:00 AM  Result Value Ref Range Status   Specimen Description BLOOD LEFT HAND  Final   Special Requests   Final    BOTTLES DRAWN AEROBIC ONLY Blood Culture adequate volume   Culture   Final    NO GROWTH < 24 HOURS Performed at [facility], [address] [place], [place] [address]    Report Status PENDING  Incomplete  Culture, blood (routine x 2)     Status: None (Preliminary result)  Collection Time: 03/15/17 11:04 AM  Result Value Ref Range Status   Specimen Description BLOOD RIGHT ANTECUBITAL  Final   Special Requests   Final    BOTTLES DRAWN AEROBIC ONLY Blood Culture results may not be optimal due to an inadequate volume of blood received in culture bottles   Culture   Final    NO GROWTH < 24 HOURS Performed at [facility], [address], [place], [place] [address]    Report Status PENDING  Incomplete  Culture, Urine     Status: None   Collection Time: 03/15/17 12:45 PM  Result Value Ref Range Status   Specimen Description URINE, CLEAN CATCH  Final   Special Requests NONE  Final   Culture   Final    NO GROWTH Performed at [facility], [address], [place], [place] [address]    Report Status 03/16/2017 FINAL  Final     Labs: BNP (last 3 results)  Recent Labs  03/08/17 1658 03/15/17 1105  BNP 2,859.4* 1,580.3*   Basic Metabolic Panel:  Recent Labs Lab 03/15/17 1008 03/15/17 1547 03/16/17 0624 03/17/17 0438  NA 139  --  140 140  K 3.5  --  3.4* 3.7  CL 105  --  101 102  CO2 27  --  29 29  GLUCOSE 101*  --  97 98  BUN 8  --  9 9  CREATININE 1.04* 0.99 1.02* 0.94  CALCIUM 8.9  --  9.0 9.2   Liver Function Tests:  Recent Labs Lab 03/15/17 1105  AST 19  ALT 9*  ALKPHOS 55  BILITOT 0.2*  PROT 6.4*  ALBUMIN 3.3*    Recent Labs Lab 03/15/17 1105  LIPASE 10*   No results for input(s): AMMONIA in the last 168 hours. CBC:  Recent Labs Lab 03/15/17 1008 03/15/17 1547  03/16/17 0624 03/17/17 0438  WBC 6.8 5.8 5.8 6.6  NEUTROABS 2.2  --   --   --   HGB 10.4* 10.6* 11.1* 11.4*  HCT 32.7* 33.6* 34.6* 35.7*  MCV 72.7* 71.8* 71.0* 71.7*  PLT 267 261 268 262   Cardiac Enzymes:  Recent Labs Lab 03/15/17 1105  TROPONINI 0.09*   BNP: Invalid input(s): POCBNP CBG: No results for input(s): GLUCAP in the last 168 hours. D-Dimer No results for input(s): DDIMER in the last 72 hours. Hgb A1c  Recent Labs  03/15/17 1547  HGBA1C 4.6*   Lipid Profile No results for input(s): CHOL, HDL, LDLCALC, TRIG, CHOLHDL, LDLDIRECT in the last 72 hours. Thyroid function studies  Recent Labs  03/15/17 1547  TSH 0.750   Anemia work up No results for input(s): VITAMINB12, FOLATE, FERRITIN, TIBC, IRON, RETICCTPCT in the last 72 hours. Urinalysis    Component Value Date/Time   COLORURINE YELLOW 03/15/2017 1246   APPEARANCEUR HAZY (A) 03/15/2017 1246   LABSPEC 1.004 (L) 03/15/2017 1246   PHURINE 7.0 03/15/2017 1246   GLUCOSEU NEGATIVE 03/15/2017 1246   HGBUR MODERATE (A) 03/15/2017 1246   BILIRUBINUR NEGATIVE 03/15/2017 1246   BILIRUBINUR neg 10/01/2016 0950   KETONESUR NEGATIVE 03/15/2017 1246   PROTEINUR NEGATIVE 03/15/2017 1246   UROBILINOGEN 0.2 10/01/2016 0950   UROBILINOGEN 0.2 07/29/2015 0857   NITRITE NEGATIVE 03/15/2017 1246   LEUKOCYTESUR LARGE (A) 03/15/2017 1246   Sepsis Labs Invalid input(s): PROCALCITONIN,  WBC,  LACTICIDVEN Microbiology Recent Results (from the past 240 hour(s))  Culture, blood (Routine X 2) w Reflex to ID Panel     Status: None  Collection Time: 03/09/17  7:02 AM  Result Value Ref Range Status   Specimen Description BLOOD LEFT ANTECUBITAL  Final   Special Requests   Final    BOTTLES DRAWN AEROBIC ONLY Blood Culture adequate volume   Culture   Final    NO GROWTH 5 DAYS Performed at [facility], [address], [place], [place] [address]    Report Status 03/14/2017 FINAL  Final  Culture, blood (Routine X  2) w Reflex to ID Panel     Status: None   Collection Time: 03/09/17  7:02 AM  Result Value Ref Range Status   Specimen Description BLOOD LEFT HAND  Final   Special Requests   Final    BOTTLES DRAWN AEROBIC ONLY Blood Culture adequate volume   Culture   Final    NO GROWTH 5 DAYS Performed at [facility], [address] [place], [place] [address]    Report Status 03/14/2017 FINAL  Final  Culture, blood (routine x 2)     Status: None (Preliminary result)   Collection Time: 03/15/17 11:00 AM  Result Value Ref Range Status   Specimen Description BLOOD LEFT HAND  Final   Special Requests   Final    BOTTLES DRAWN AEROBIC ONLY Blood Culture adequate volume   Culture   Final    NO GROWTH < 24 HOURS Performed at [facility], [address], [place], [place] [address]    Report Status PENDING  Incomplete  Culture, blood (routine x 2)     Status: None (Preliminary result)   Collection Time: 03/15/17 11:04 AM  Result Value Ref Range Status   Specimen Description BLOOD RIGHT ANTECUBITAL  Final   Special Requests   Final    BOTTLES DRAWN AEROBIC ONLY Blood Culture results may not be optimal due to an inadequate volume of blood received in culture bottles   Culture   Final    NO GROWTH < 24 HOURS Performed at [facility], [address] [place], [place] [address]    Report Status PENDING  Incomplete  Culture, Urine     Status: None   Collection Time: 03/15/17 12:45 PM  Result Value Ref Range Status   Specimen Description URINE, CLEAN CATCH  Final   Special Requests NONE  Final   Culture   Final    NO GROWTH Performed at [facility], [address], [place], [place] [address]    Report Status 03/16/2017 FINAL  Final     Time coordinating discharge: 40 minutes  SIGNED:  Noralee Stain, DO [facility] Pager [phone]  If 7PM-7AM, please contact night-coverage [email or web address] Password [email or web address] 03/17/2017, 2:06 PM

## 2017-03-17 NOTE — Discharge Instructions (Signed)
Cardiomyopathy, Adult °Cardiomyopathy is a long-term (chronic) disease of the heart muscle. The disease makes the heart muscle thick, weak, or stiff. As a result, the heart works harder to pump blood. Over time, cardiomyopathy can lead to heart failure. °There are several types of cardiomyopathy: °· Dilated cardiomyopathy. This type causes the ventricles to become weak and stretched out. °· Hypertrophic cardiomyopathy. This type causes the heart muscle to thicken. °· Restrictive cardiomyopathy. This type causes the heart muscle to become stiff. °· Ischemic cardiomyopathy. This type involves narrowing arteries that cause the walls of the heart to get thinner. °· Peripartum cardiomyopathy. This type occurs during pregnancy or shortly after pregnancy. ° °What are the causes? °This condition may be caused by: °· A gene that is passed down (inherited) from a family member. °· A medical condition that damages the heart, such as: °? Diabetes. °? High blood pressure. °? Viral infection of the heart. °? Heart attack. °? Coronary heart disease. °· Alcoholism. °· Using illegal drugs or some prescription medicines. °· Pregnancy. °· Your body absorbing and storing too much iron (hemochromatosis). °· Autoimmune diseases, connective tissue diseases, endocrine diseases, and muscle diseases. °· Cancer treatments. °· Buildup of proteins in your organs (amyloidosis), or inflammation in your organs (sarcoidosis). ° °Often, the cause is not known. °What increases the risk? °This condition is more likely to develop in people who: °· Have a family history of cardiomyopathy or other heart problems. °· Are overweight or obese. °· Use illegal drugs. °· Abuse alcohol. °· Have a medical condition that damages the heart. ° °What are the signs or symptoms? °Symptoms of this condition include: °· Shortness of breath, especially during activity. °· Fatigue. °· An irregular heartbeat and heart  murmurs. °· Dizziness. °· Light-headedness. °· Fainting. °· Chest pain. °· Coughing. °· Swelling in the lower legs, ankles, feet, abdomen, and neck veins. ° °Often, people with this condition have no symptoms. °How is this diagnosed? °This condition is diagnosed based on: °· Your symptoms and medical history. °· A physical exam. °· Tests. ° °Tests may include: °· Blood tests. °· Imaging studies of your heart, such as: °? X-rays. °? An echocardiogram. °? An MRI. °· An electrocardiogram (ECG). This records your heart's electrical activity. °· A test in which you wear a portable device (event monitor) to record your heart's electrical activity while you go about your day. °· A stress test. This monitors your heart's activity while exercising. °· Cardiac catheterization. This procedure checks the blood pressure and blood flow in your heart. °· An angiogram. This is an injection of dye into your arteries before imaging studies are taken. °· Heart tissue biopsy. This removes a sample of heart tissue for examination. ° °How is this treated? ° °Treatment for this condition depends on the type of cardiomyopathy you have and the severity of your symptoms. If you do not have symptoms, you may not need treatment. If you need treatment, it may include: °· Lifestyle changes, such as: °? Eating a heart-healthy diet that includes plenty of fruits, vegetables, and whole grains, and cutting down on salt (sodium). °? Maintaining a healthy weight, and losing weight, if needed. °? Getting regular exercise. °? Quitting smoking, if you smoke. °? Avoiding alcohol. °? Medicine to: °§ Lower your blood pressure. °§ Slow down your heart rate. °§ Keep your heart beating in a steady rhythm. °§ Clear excess fluids from your body. °§ Prevent blood clots. °§ Balance minerals (electrolytes) in your body and get rid of extra   sodium in your body. °§ Reduce inflammation. °§ Strengthen your heartbeat. °? Surgery to: °§ Repair a defect. °§ Remove  thickened tissue. °§ Destroy tissues in the area of abnormal electrical activity (ablation). °§ Implant a device to treat serious heart rhythm problems (implantable cardioverter-defibrillator, or ICD), or a pacemaker. °§ Replace your heart (heart transplant) if all other treatments have failed (end stage). ° °Other treatments may include cardiac resynchronization therapy (CRT) or a left ventricular assist device (LVAD). °Follow these instructions at home: °Lifestyle °· Eat a heart-healthy diet. Work with your health care provider or a registered dietitian to learn about healthy eating options. °· Maintain a healthy weight. °· Stay physically active. Ask your health care provider to suggest some activities that are good for you. °· Do not use any products that contain nicotine or tobacco, such as cigarettes and e-cigarettes. If you need help quitting, ask your health care provider. °· Limit alcohol intake to no more than one drink per day for nonpregnant women and no more than two drinks per day for men. One drink equals 12 oz of beer, 5 oz of wine, or 1½ oz of hard liquor. °· Try to get at least 7 hours of sleep each night. °· Find healthy ways to manage stress. °General instructions °· Take over-the-counter and prescription medicines only as told by your health care provider. Some medicines can be dangerous for your heart. °· Tell all health care providers, including your dentist, that you have cardiomyopathy. When you visit the dentist or have surgery, ask your health care provider if you need antibiotics before having dental care or before the surgery. °· Ask your health care provider if you should wear a medical identification bracelet. This may be important if you have a pacemaker or a defibrillator. °· Make sure you get all recommended vaccinations and an annual flu shot. °· Work closely with your health care provider to manage any long-lasting (chronic) conditions. °· Keep all follow-up visits as told by your  health care provider. This is important, even if you do not have any symptoms. Your health care provider may need to make sure your condition is not getting worse. °How is this prevented? °This condition cannot be prevented. Parents, siblings, and children of people with this condition may be at risk for the condition. It is a good idea for them to get screened for the condition because it is best when cardiomyopathy is found early. Screening is done with an ECG and echocardiogram. °People who want to start a family may also want to meet with a genetic counselor to discuss the risk of having a child with cardiomyopathy. °Contact a health care provider if: °· Your symptoms get worse. °· You have new symptoms. °Get help right away if: °· You have severe chest pain. °· You have shortness of breath. °· You cough up pink, bubbly material. °· You have sudden sweating. °· You feel nauseous and you vomit. °· You suddenly become light-headed or dizzy. °· You feel your heart beating very quickly. °· It feels like your heart is skipping beats. °These symptoms may represent a serious problem that is an emergency. Do not wait to see if the symptoms will go away. Get medical help right away. Call your local emergency services (911 in the [place] Do not drive yourself to the hospital. °This information is not intended to replace advice given to you by your health care provider. Make sure you discuss any questions you have with your health care   provider. °Document Released: 11/21/2004 Document Revised: 05/06/2016 Document Reviewed: 03/10/2016 °Elsevier Interactive Patient Education © 2018 [employer] ° °

## 2017-03-17 NOTE — Progress Notes (Signed)
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  On PO lasix     Inpatient Medications    Scheduled Meds: . carvedilol  3.125 mg Oral BID WC  . ferrous sulfate  325 mg Oral BID WC  . furosemide  20 mg Oral Daily  . heparin  5,000 Units Subcutaneous Q8H  . polyethylene glycol  17 g Oral Daily  . potassium chloride  10 mEq Oral Daily  . sodium chloride flush  3 mL Intravenous Q12H   Continuous Infusions:  PRN Meds: acetaminophen **OR** acetaminophen, docusate sodium, HYDROcodone-acetaminophen, ondansetron **OR** ondansetron (ZOFRAN) IV   Vital Signs    Vitals:   03/16/17 0601 03/16/17 1422 03/16/17 2127 03/17/17 0640  BP: (!) 106/53 110/60 123/87 118/72  Pulse: 77 72 86 78  Resp: 16 16 16 16   Temp: 97.5 F (36.4 C) 98.1 F (36.7 C) 97.7 F (36.5 C) 97.6 F (36.4 C)  TempSrc: Oral Oral Oral Oral  SpO2: 99% 99% 100% 100%  Weight: (!) 341 lb 7.9 oz (154.9 kg)   153 lb 8 oz (69.6 kg)  Height:        Intake/Output Summary (Last 24 hours) at 03/17/17 1351 Last data filed at 03/17/17 1000  Gross per 24 hour  Intake              740 ml  Output              600 ml  Net              140 ml   Filed Weights   03/15/17 1646 03/16/17 0601 03/17/17 0640  Weight: 163 lb 12.8 oz (74.3 kg) (!) 341 lb 7.9 oz (154.9 kg) 153 lb 8 oz (69.6 kg)    Telemetry    NSR  - Personally Reviewed  ECG     NSR  - Personally Reviewed  Physical Exam   GEN: No acute distress.   Neck: No JVD Cardiac: RRR, no murmurs, rubs, or gallops.  Respiratory: Clear to auscultation bilaterally. GI: Soft, nontender, non-distended  MS: No edema; No deformity. Neuro:  Nonfocal  Psych: Normal affect   Labs    Chemistry Recent Labs Lab 03/15/17 1008 03/15/17 1105 03/15/17 1547 03/16/17 0624 03/17/17 0438  NA 139  --   --  140 140  K 3.5  --   --  3.4*  3.7  CL 105  --   --  101 102  CO2 27  --   --  29 29  GLUCOSE 101*  --   --  97 98  BUN 8  --   --  9 9  CREATININE 1.04*  --  0.99 1.02* 0.94  CALCIUM 8.9  --   --  9.0 9.2  PROT  --  6.4*  --   --   --   ALBUMIN  --  3.3*  --   --   --   AST  --  19  --   --   --   ALT  --  9*  --   --   --   ALKPHOS  --  55  --   --   --   BILITOT  --  0.2*  --   --   --   GFRNONAA >60  --  >60 >60 >60  GFRAA >  60  --  >60 >60 >60  ANIONGAP 7  --   --  10 9     Hematology Recent Labs Lab 03/15/17 1547 03/16/17 0624 03/17/17 0438  WBC 5.8 5.8 6.6  RBC 4.68 4.87 4.98  HGB 10.6* 11.1* 11.4*  HCT 33.6* 34.6* 35.7*  MCV 71.8* 71.0* 71.7*  MCH 22.6* 22.8* 22.9*  MCHC 31.5 32.1 31.9  RDW 17.4* 17.2* 17.4*  PLT 261 268 262    Cardiac Enzymes Recent Labs Lab 03/15/17 1105  TROPONINI 0.09*    Recent Labs Lab 03/15/17 1019  TROPIPOC 0.04     BNP Recent Labs Lab 03/15/17 1105  BNP 1,580.3*     DDimer No results for input(s): DDIMER in the last 168 hours.   Radiology    No results found.  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 Better on medical therapy Ready for DC .  Very stable On coreg 3.125 BID Lasix 20 mg a day Kdur 10 meq a day Will also add Losartan 25 mg a day ( has been told not to nurse while on these meds)  She is planning on bottle feeding  She has an appt with Dr. Anne FuSkains on July 9.   Has been instructed to call sooner if she has any problems Will need a BMP at that office visit   Signed, Kristeen MissPhilip Tully Burgo, MD  03/17/2017, 1:51 PM

## 2017-03-18 ENCOUNTER — Encounter: Payer: Self-pay | Admitting: Obstetrics and Gynecology

## 2017-03-18 ENCOUNTER — Ambulatory Visit (INDEPENDENT_AMBULATORY_CARE_PROVIDER_SITE_OTHER): Payer: Medicaid Other | Admitting: Obstetrics and Gynecology

## 2017-03-18 VITALS — BP 118/79 | HR 94 | Wt 156.0 lb

## 2017-03-18 DIAGNOSIS — O99345 Other mental disorders complicating the puerperium: Principal | ICD-10-CM

## 2017-03-18 DIAGNOSIS — F53 Postpartum depression: Secondary | ICD-10-CM

## 2017-03-18 NOTE — Patient Instructions (Signed)

## 2017-03-18 NOTE — Progress Notes (Signed)
Patient states she is considering the IUD  .Post Partum Exam  Lisa IslamDominique M Vazquez is a 31 y.o. [phone] [id number] female who presents for a postpartum visit. She is 6 weeks postpartum following a low cervical transverse Cesarean section on 02/04/17. I have fully reviewed the prenatal and intrapartum course. The delivery was at 39.1 gestational weeks.  Anesthesia: spinal. Postpartum course has been complicated by pneumonia and postpartum cardiomyopathy. She has been seen several times in the [facility] and left AMA. Was just recently released from the hospital for CHF. Please see medical records for additional information. . Baby's course has been unremarkable. Baby is feeding by bottle - Similac Advance. Bleeding no bleeding. Bowel function is abnormal: problems with constipation.. Bladder function is normal. Patient is not sexually active. Contraception method is none. Postpartum depression screening:positive  The following portions of the patient's history were reviewed and updated as appropriate: allergies, current medications, past family history, past medical history and past surgical history.  Review of Systems Pertinent items are noted in HPI.    Objective:  unknown if currently breastfeeding.  General:  alert   Breasts:  deferred  Lungs: clear to auscultation bilaterally  Heart:  regular rate and rhythm, S1, S2 normal, no murmur, click, rub or gallop  Abdomen: soft, non-tender; bowel sounds normal; no masses,  no organomegaly, incision healing well.    Vulva:  not evaluated  Vagina: not evaluated  Cervix:  not evaluated  Corpus: not examined  Adnexa:  not evaluated  Rectal Exam: Not performed.        Assessment:    Normal postpartum exam. Contraceptive management   CHF  Plan:   Return to normal ADL's as tolerates. Continue meds and follow as per cardiology. Information on ParaGard provided to pt. Pt to reframe from intercourse into insertion of IUD. RTC in 2 weeks for IUD insertion.

## 2017-03-20 LAB — CULTURE, BLOOD (ROUTINE X 2)
Culture: NO GROWTH
Culture: NO GROWTH
Special Requests: ADEQUATE

## 2017-03-24 ENCOUNTER — Institutional Professional Consult (permissible substitution): Payer: Medicaid Other

## 2017-03-24 NOTE — BH Specialist Note (Deleted)
Integrated Behavioral Health Initial Visit  MRN: [id number] Name: Lisa Vazquez   Session Start time: *** Session End time: *** Total time: {IBH Total Time:21014050}  Type of Service: Integrated Behavioral Health- Individual/Family Interpretor:No. Interpretor Name and Language: n/a   Warm Hand Off Completed.       SUBJECTIVE: Lisa IslamDominique M Sermons is a 31 y.o. female accompanied by {Persons; PED relatives w/patient:19415}. Patient was referred by Dr Alysia PennaErvin for depression. Patient reports the following symptoms/concerns: *** Duration of problem: ***; Severity of problem: {Mild/Moderate/Severe:20260}  OBJECTIVE: Mood: {BHH MOOD:22306} and Affect: {BHH AFFECT:22307} Risk of harm to self or others: {CHL AMB BH Suicide Current Mental Status:21022748}   LIFE CONTEXT: Family and Social: *** School/Work: *** Self-Care: *** Life Changes: ***  GOALS ADDRESSED: Patient will reduce symptoms of: {IBH Symptoms:21014056} and increase knowledge and/or ability of: {IBH Patient Tools:21014057} and also: {IBH Goals:21014053}   INTERVENTIONS: {IBH Interventions:21014054}  Standardized Assessments completed: {IBH Screening Tools:21014051}  ASSESSMENT: Patient currently experiencing ***. Patient may benefit from ***.  PLAN: 1. Follow up with behavioral health clinician on : *** 2. Behavioral recommendations: *** 3. Referral(s): {IBH Referrals:21014055} 4. "From scale of 1-10, how likely are you to follow plan?": ***  Gehrig Patras C Sharmane Dame, LCSWA

## 2017-03-30 ENCOUNTER — Ambulatory Visit (INDEPENDENT_AMBULATORY_CARE_PROVIDER_SITE_OTHER): Payer: Medicaid Other | Admitting: Cardiology

## 2017-03-30 ENCOUNTER — Encounter: Payer: Self-pay | Admitting: Cardiology

## 2017-03-30 VITALS — BP 104/60 | HR 76 | Ht 64.0 in | Wt 155.0 lb

## 2017-03-30 DIAGNOSIS — Z720 Tobacco use: Secondary | ICD-10-CM | POA: Diagnosis not present

## 2017-03-30 DIAGNOSIS — I5022 Chronic systolic (congestive) heart failure: Secondary | ICD-10-CM | POA: Diagnosis not present

## 2017-03-30 DIAGNOSIS — R079 Chest pain, unspecified: Secondary | ICD-10-CM

## 2017-03-30 DIAGNOSIS — E876 Hypokalemia: Secondary | ICD-10-CM | POA: Diagnosis not present

## 2017-03-30 DIAGNOSIS — R9431 Abnormal electrocardiogram [ECG] [EKG]: Secondary | ICD-10-CM

## 2017-03-30 DIAGNOSIS — I420 Dilated cardiomyopathy: Secondary | ICD-10-CM | POA: Diagnosis not present

## 2017-03-30 NOTE — Patient Instructions (Signed)
Medication Instructions:  Your physician recommends that you continue on your current medications as directed. Please refer to the Current Medication list given to you today.   Labwork: Your physician recommends that you return for lab work today for BMET  Testing/Procedures: None Ordered   Follow-Up: Your physician recommends that you schedule a follow-up appointment in: 4 weeks with Dr. Elease HashimotoNahser   Any Other Special Instructions Will Be Listed Below (If Applicable).     If you need a refill on your cardiac medications before your next appointment, please call your pharmacy.

## 2017-03-30 NOTE — Progress Notes (Signed)
Cardiology Office Note   Date:  03/30/2017   ID:  Lisa IslamDominique M Space, DOB [birth year], MRN [id number]  [facility]  [facility]  Cardiologist:    Dr. Anne FuSkains  Chief Complaint  Patient presents with  . Cardiomyopathy    post partum      History of Present Illness: Lisa Vazquez is a 31 y.o. female who presents for post partum cardiomyopathy.  She was admtted with acute systolic HF and her EF was 35-40% at bedside in ER.  No PE on CTA.   On complete 2 D echo EF was 25%.  Mild LVH , diffuse hypokinesis with inferolateral akinesis, LV cavity was moderately dilated. LV end diastolic pressure most likely 20 mmHg.  She had moderate MR, LA was moderately to severely dilated.  PA pk pressure 38 mmHG PR pressure 8 mmHg. On d/c placed on coreg 3.125 BID, lasix 20 mg a day, kdur 10 meq daily and losartan 25 mg daily.    Today she has no complaints.  She is to have scales beginning tomorrow she has been watching her salt intake.  No pain except sharp shooting under Lt breast.  CXRs and CTA have been stable.   Planning to stop tobacco.   Past Medical History:  Diagnosis Date  . Acute CHF (HCC) 03/15/2017  . Atypical squamous cell changes of undetermined significance (ASCUS) on cervical cytology with positive high risk human papilloma virus (HPV) 03/17/2013   12/2012 pap smear ASCUS +HPV-  03/17/2013 colpo- [ ] Needs Colpo postpartum 2018  . Bacterial vaginal infection 04/06/2013  . Bacterial vaginosis 04/06/2013  . BV (bacterial vaginosis)   . CHF (congestive heart failure) (HCC)   . Chronic hypertension during pregnancy, antepartum 07/17/2016   [x]  Aspirin 81 mg daily after 12 weeks; discontinue after 36 weeks--Rx sent Current antihypertensives:  None   [facility] and surveillance [facility] refresh [facility] as needed)  Lab Results Component Value Date  PLT 240 07/05/2016  CREATININE 0.74 07/17/2016  AST 11 07/17/2016  ALT 5 07/17/2016  Baseline PCR 347  Antenatal  Testing CHTN - O10.919  Group I  BP < 140/90, no preeclampsia, AGA,  nml AFV, +/- meds    Group II BP > 140/90, on meds, no preeclampsia, AGA, nml AFV  20-28-34-38  20-24-28-32-35-38  32//2 x wk  28//BPP wkly then 32//2 x wk  40 no meds; 39 meds  PRN or 37 Pre-eclampsia  GHTN - O13.9/Preeclampsia without severe features  - O14.00   Preeclampsia with severe features - O14.10  Q 3-4wks  Q 2 wks  28//BPP wkly then 32//2 x wk  Inpatient  37  PRN or 34    . CIN I (cervical intraepithelial neoplasia I) 04/06/2013  . Essential hypertension 03/08/2017  . Gestational diabetes    gestational  . 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   . History of cesarean delivery 02/03/2017  . History of recurrent UTIs 07/24/2016   [ ] Need UTI suppression  [ ] Ask if she took Keflex 07/06/16. Culture Positive 07/17/16. Not she if this is Tx failure or failure to take ABX.   Patient did finally take ATB- she is now on suppression  . Hypertension   . Kidney infection   . Marijuana use 07/26/2016   Pos 07/11/16  . Postpartum care and examination 03/18/2017  . 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 Systolic and diastolic CHF, acute (HCC) 03/08/2017  . UTI (lower urinary tract infection)  Past Surgical History:  Procedure Laterality Date  . CESAREAN SECTION     x3  . CESAREAN SECTION WITH BILATERAL TUBAL LIGATION Bilateral 02/04/2017   Procedure: REPEAT CESAREAN SECTION WITH BILATERAL TUBAL LIGATION;  Surgeon: Geneva Bing, MD;  Location: [facility] BIRTHING SUITES;  Service: Obstetrics;  Laterality: Bilateral;     Current Outpatient Prescriptions  Medication Sig Dispense Refill  . acetaminophen (TYLENOL) 500 MG tablet Take 1,000 mg by mouth every 6 (six) hours as needed for mild pain or headache.    . buprenorphine (SUBUTEX) 8 MG SUBL SL tablet Place 8 mg under the tongue daily.     . carvedilol (COREG) 3.125 MG tablet Take 1 tablet (3.125 mg total) by mouth 2 (two) times  daily with a meal. 60 tablet 0  . ferrous sulfate 325 (65 FE) MG tablet Take 1 tablet (325 mg total) by mouth 2 (two) times daily with a meal. 60 tablet 0  . furosemide (LASIX) 20 MG tablet Take 1 tablet (20 mg total) by mouth daily. 30 tablet 0  . losartan (COZAAR) 25 MG tablet Take 1 tablet (25 mg total) by mouth daily. 30 tablet 0  . polyethylene glycol (MIRALAX / GLYCOLAX) packet Take 17 g by mouth daily. 14 each 0  . potassium chloride (K-DUR,KLOR-CON) 10 MEQ tablet Take 1 tablet (10 mEq total) by mouth daily. 30 tablet 0   No current facility-administered medications for this visit.     Allergies:   Patient has no known allergies.    Social History:  The patient  reports that she has been smoking Cigarettes.  She has a 1.75 pack-year smoking history. She has quit using smokeless tobacco. She reports that she does not drink alcohol or use drugs.   Family History:  The patient's family history includes Asthma in her brother; Heart disease in her father; Hypertension in her mother.    ROS:  General:no colds or fevers, no weight changes Skin:no rashes or ulcers HEENT:no blurred vision, no congestion CV:see HPI PUL:see HPI GI:no diarrhea constipation or melena, no indigestion GU:no hematuria, no dysuria MS:no joint pain, no claudication Neuro:no syncope, no lightheadedness Endo:no diabetes, no thyroid disease She is bottle feeding  Wt Readings from Last 3 Encounters:  03/30/17 155 lb (70.3 kg)  03/18/17 156 lb (70.8 kg)  03/17/17 153 lb 8 oz (69.6 kg)     PHYSICAL EXAM: VS:  BP 104/60   Pulse 76   Ht 5\' 4"  (1.626 m)   Wt 155 lb (70.3 kg)   SpO2 98%   BMI 26.61 kg/m  , BMI Body mass index is 26.61 kg/m. General:Pleasant affect, NAD Skin:Warm and dry, brisk capillary refill HEENT:normocephalic, sclera clear, mucus membranes moist Neck:supple, no JVD, no bruits  Heart:S1S2 RRR without murmur, gallup, rub or click Lungs:clear without rales, rhonchi, or  wheezes UEA:VWUJ, non tender, + BS, do not palpate liver spleen or masses Ext:no lower ext edema, 2+ pedal pulses, 2+ radial pulses Neuro:alert and oriented X 3, MAE, follows commands, + facial symmetry    EKG:  EKG is ordered today. The ekg ordered today demonstrates  SR Biatrial enlargement LAD, TWI in lateral leads similar to hospital EKG.   Recent Labs: 03/09/2017: Magnesium 1.8 03/15/2017: ALT 9; B Natriuretic Peptide 1,580.3; TSH 0.750 03/17/2017: BUN 9; Creatinine, Ser 0.94; Hemoglobin 11.4; Platelets 262; Potassium 3.7; Sodium 140    Lipid Panel    Component Value Date/Time   CHOL 154 04/06/2013 1151   TRIG 42 04/06/2013 1151   HDL  72 04/06/2013 1151   CHOLHDL 2.1 04/06/2013 1151   VLDL 8 04/06/2013 1151   LDLCALC 74 04/06/2013 1151       Other studies Reviewed: Additional studies/ records that were reviewed today include: .  ECHO 03/09/2017 Study Conclusions  - Left ventricle: The cavity size was moderately dilated. Wall   thickness was increased in a pattern of mild LVH. Diffuse   hypokinesis with inferolateral akinesis. The estimated ejection   fraction was 25%. Doppler parameters are consistent with   restrictive physiology, indicative of decreased left ventricular   diastolic compliance and/or increased left atrial pressure.   E/medial e&' > 15, suggesting LV end diastolic pressure at least   20 mmHg. - Aortic valve: There was no stenosis. - Mitral valve: There was moderate regurgitation, suspect   functional. - Left atrium: The atrium was moderately to severely dilated. - Right ventricle: The cavity size was normal. Systolic function   was mildly reduced. - Tricuspid valve: Peak RV-RA gradient (S): 30 mm Hg. - Pulmonary arteries: PA peak pressure: 38 mm Hg (S). - Systemic veins: IVC measured 1.6 cm with < 50% respirophasic   variation, suggesting RA pressure 8 mmHg.  Impressions:  - Moderately dilated LV with mild LV hypertrophy. EF 25%, diffuse    hypokinesis with inferolateral akinesis. Restrictive diastolic   function with evidence for elevated LV filling pressure. Normal   RV size with mildly decreased systolic function. Moderate central   regurgitation, likely functional. Mild pulmonary hypertension.  ASSESSMENT AND PLAN:  1.  Post partum dilated cardiomyopathy.   EF 25% euvolemic today.  On lasix ARB and BB, will refill all 3 meds.  No change in meds with her BP.  HR is stable.   She will follow up with Dr. Elease Hashimoto in 4 weeks.  Pt is bottle feeding baby  2. Abnormal EKG though no changes from hospital.  No cardiac pain. Plan had been if no improvement with meds for HF then ischemic work up but pt is euvolemic today.    3.  Now chronic systolic HF.  Stable currently.  On diuretic   4. Elevated troponins thought to be due to demand ischemia.  5. Tobacco use, decreasing.  6. Hypokalemic in hospital will recheck.     Current medicines are reviewed with the patient today.  The patient Has no concerns regarding medicines.  The following changes have been made:  See above Labs/ tests ordered today include:see above  Disposition:   FU:  see above  Signed, Nada Boozer, NP  03/30/2017 4:18 PM    [facility] [address] [place], [place], [place]  [address]/ [address] [place], [place] Phone: [phone]; Fax: [phone]  [phone]

## 2017-03-31 ENCOUNTER — Telehealth: Payer: Self-pay | Admitting: Cardiology

## 2017-03-31 LAB — BASIC METABOLIC PANEL
BUN/Creatinine Ratio: 8 — ABNORMAL LOW (ref 9–23)
BUN: 9 mg/dL (ref 6–20)
CALCIUM: 9.6 mg/dL (ref 8.7–10.2)
CO2: 24 mmol/L (ref 20–29)
Chloride: 100 mmol/L (ref 96–106)
Creatinine, Ser: 1.09 mg/dL — ABNORMAL HIGH (ref 0.57–1.00)
GFR, EST AFRICAN AMERICAN: 78 mL/min/{1.73_m2} (ref >59)
GFR, EST NON AFRICAN AMERICAN: 67 mL/min/{1.73_m2} (ref >59)
Glucose: 84 mg/dL (ref 65–99)
Potassium: 4.2 mmol/L (ref 3.5–5.2)
SODIUM: 141 mmol/L (ref 134–144)

## 2017-03-31 NOTE — Telephone Encounter (Signed)
Patient returning your call.

## 2017-04-01 NOTE — Telephone Encounter (Signed)
Patient was advised on lab results. Patient verbalized understanding. Patient is working on getting back in to see Nada BoozerLaura Ingold, NP in 4 weeks.

## 2017-04-02 NOTE — Addendum Note (Signed)
Addendum  created 04/02/17 1653 by Miller, Warren Ray, MD   Sign clinical note    

## 2017-04-02 NOTE — Anesthesia Postprocedure Evaluation (Signed)
Anesthesia Post Note  Patient: Lisa Vazquez  Procedure(s) Performed: Procedure(s) (LRB): REPEAT CESAREAN SECTION WITH BILATERAL TUBAL LIGATION (Bilateral)     Anesthesia Post Evaluation  Last Vitals:  Vitals:   02/06/17 0541 02/07/17 0553  BP: 132/88 (!) 148/91  Pulse: 78 75  Resp: 16 18  Temp: 36.9 C 37 C    Last Pain:  Vitals:   02/07/17 0930  TempSrc:   PainSc: 8                  Lowella CurbWarren Ray Vale Mousseau

## 2017-04-13 ENCOUNTER — Other Ambulatory Visit: Payer: Medicaid Other

## 2017-04-15 ENCOUNTER — Telehealth: Payer: Self-pay | Admitting: Cardiology

## 2017-04-15 NOTE — Telephone Encounter (Signed)
Lisa CornfieldStephanie [facility] Chronic disease nurse[facility] calling, states that she faxed over paperwork for patient to start tele-monitoring and was calling for an update.

## 2017-04-16 NOTE — Telephone Encounter (Signed)
S/w Judeth CornfieldStephanie just to clarify what actually the phone note meant.  It is like [facility], to keep pt's out of the hospital but for medicaid pt's . Nada BoozerLaura Ingold, NP, just needs to set parameters for pt after paperwork is received. Judeth CornfieldStephanie is going to fax paperwork again. Will send to Vernona RiegerLaura she might have already received.

## 2017-04-17 ENCOUNTER — Telehealth: Payer: Self-pay | Admitting: *Deleted

## 2017-04-17 NOTE — Telephone Encounter (Signed)
Faxing pt's tel-monitoring paperwork to Berta MinorStephanie Williams at [phone] and # is [phone].

## 2017-04-29 NOTE — Progress Notes (Deleted)
Cardiology Office Note   Date:  04/29/2017   ID:  Lisa Vazquez, DOB 29-Apr-1986, MRN [id number]  PCP:  Department[facility]  Cardiologist:  Dr. Anne FuSkains    No chief complaint on file.     History of Present Illness: Lisa IslamDominique M Vazquez is a 31 y.o. female who presents for ***  post partum cardiomyopathy.  She was admtted with acute systolic HF and her EF was 35-40% at bedside in ER.  No PE on CTA.   On complete 2 D echo EF was 25%.  Mild LVH , diffuse hypokinesis with inferolateral akinesis, LV cavity was moderately dilated. LV end diastolic pressure most likely 20 mmHg. She had moderate MR, LA was moderately to severely dilated. PA pk pressure 38 mmHG PR pressure 8 mmHg. On d/c placed on coreg 3.125 BID, lasix 20 mg a day, kdur 10 meq daily and losartan 25 mg daily.     Past Medical History:  Diagnosis Date  . Acute CHF (HCC) 03/15/2017  . Atypical squamous cell changes of undetermined significance (ASCUS) on cervical cytology with positive high risk human papilloma virus (HPV) 03/17/2013   12/2012 pap smear ASCUS +HPV-  03/17/2013 colpo- [ ] Needs Colpo postpartum 2018  . Bacterial vaginal infection 04/06/2013  . Bacterial vaginosis 04/06/2013  . BV (bacterial vaginosis)   . CHF (congestive heart failure) (HCC)   . Chronic hypertension during pregnancy, antepartum 07/17/2016   [x]  Aspirin 81 mg daily after 12 weeks; discontinue after 36 weeks--Rx sent Current antihypertensives:  None   Baseline and surveillance labs (pulled in [facility], refresh links as needed)  Lab Results Component Value Date  PLT 240 07/05/2016  CREATININE 0.74 07/17/2016  AST 11 07/17/2016  ALT 5 07/17/2016  Baseline PCR 347  Antenatal Testing CHTN - O10.919  Group I  BP < 140/90, no preeclampsia, AGA,  nml AFV, +/- meds    Group II BP > 140/90, on meds, no preeclampsia, AGA, nml AFV  20-28-34-38  20-24-28-32-35-38  32//2 x wk  28//BPP wkly then 32//2 x wk  40 no meds; 39 meds  PRN or 37  Pre-eclampsia  GHTN - O13.9/Preeclampsia without severe features  - O14.00   Preeclampsia with severe features - O14.10  Q 3-4wks  Q 2 wks  28//BPP wkly then 32//2 x wk  Inpatient  37  PRN or 34    . CIN I (cervical intraepithelial neoplasia I) 04/06/2013  . Essential hypertension 03/08/2017  . Gestational diabetes    gestational  . 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   . History of cesarean delivery 02/03/2017  . History of recurrent UTIs 07/24/2016   [ ] Need UTI suppression  [ ] Ask if she took Keflex 07/06/16. Culture Positive 07/17/16. Not she if this is Tx failure or failure to take ABX.   Patient did finally take ATB- she is now on suppression  . Hypertension   . Kidney infection   . Marijuana use 07/26/2016   Pos 07/11/16  . Postpartum care and examination 03/18/2017  . 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 Systolic and diastolic CHF, acute (HCC) 03/08/2017  . UTI (lower urinary tract infection)     Past Surgical History:  Procedure Laterality Date  . CESAREAN SECTION     x3  . CESAREAN SECTION WITH BILATERAL TUBAL LIGATION Bilateral 02/04/2017   Procedure: REPEAT CESAREAN SECTION WITH BILATERAL TUBAL LIGATION;  Surgeon: Batavia BingPickens, Charlie, MD;  [facility];  Service: Obstetrics;  Laterality: Bilateral;     Current Outpatient Prescriptions  Medication Sig Dispense Refill  . acetaminophen (TYLENOL) 500 MG tablet Take 1,000 mg by mouth every 6 (six) hours as needed for mild pain or headache.    . buprenorphine (SUBUTEX) 8 MG SUBL SL tablet Place 8 mg under the tongue daily.     . carvedilol (COREG) 3.125 MG tablet Take 1 tablet (3.125 mg total) by mouth 2 (two) times daily with a meal. 60 tablet 0  . ferrous sulfate 325 (65 FE) MG tablet Take 1 tablet (325 mg total) by mouth 2 (two) times daily with a meal. 60 tablet 0  . furosemide (LASIX) 20 MG tablet Take 1 tablet (20 mg total) by mouth daily. 30 tablet 0  . losartan  (COZAAR) 25 MG tablet Take 1 tablet (25 mg total) by mouth daily. 30 tablet 0  . polyethylene glycol (MIRALAX / GLYCOLAX) packet Take 17 g by mouth daily. 14 each 0  . potassium chloride (K-DUR,KLOR-CON) 10 MEQ tablet Take 1 tablet (10 mEq total) by mouth daily. 30 tablet 0   No current facility-administered medications for this visit.     Allergies:   Patient has no known allergies.    Social History:  The patient  reports that she has been smoking Cigarettes.  She has a 1.75 pack-year smoking history. She has quit using smokeless tobacco. She reports that she does not drink alcohol or use drugs.   Family History:  The patient's ***family history includes Asthma in her brother; Heart disease in her father; Hypertension in her mother.    ROS:  General:no colds or fevers, no weight changes Skin:no rashes or ulcers HEENT:no blurred vision, no congestion CV:see HPI PUL:see HPI GI:no diarrhea constipation or melena, no indigestion GU:no hematuria, no dysuria MS:no joint pain, no claudication Neuro:no syncope, no lightheadedness Endo:no diabetes, no thyroid disease Wt Readings from Last 3 Encounters:  03/30/17 155 lb (70.3 kg)  03/18/17 156 lb (70.8 kg)  03/17/17 153 lb 8 oz (69.6 kg)     PHYSICAL EXAM: VS:  There were no vitals taken for this visit. , BMI There is no height or weight on file to calculate BMI. General:Pleasant affect, NAD Skin:Warm and dry, brisk capillary refill HEENT:normocephalic, sclera clear, mucus membranes moist Neck:supple, no JVD, no bruits  Heart:S1S2 RRR without murmur, gallup, rub or click Lungs:clear without rales, rhonchi, or wheezes WUJ:WJXB, non tender, + BS, do not palpate liver spleen or masses Ext:no lower ext edema, 2+ pedal pulses, 2+ radial pulses Neuro:alert and oriented, MAE, follows commands, + facial symmetry    EKG:  EKG is ordered today. The ekg ordered today demonstrates ***   Recent Labs: 03/09/2017: Magnesium 1.8 03/15/2017:  ALT 9; B Natriuretic Peptide 1,580.3; TSH 0.750 03/17/2017: Hemoglobin 11.4; Platelets 262 03/30/2017: BUN 9; Creatinine, Ser 1.09; Potassium 4.2; Sodium 141    Lipid Panel    Component Value Date/Time   CHOL 154 04/06/2013 1151   TRIG 42 04/06/2013 1151   HDL 72 04/06/2013 1151   CHOLHDL 2.1 04/06/2013 1151   VLDL 8 04/06/2013 1151   LDLCALC 74 04/06/2013 1151       Other studies Reviewed: Additional studies/ records that were reviewed today include: ***.   ASSESSMENT AND PLAN:  1.  ***   Current medicines are reviewed with the patient today.  The patient Has no concerns regarding medicines.  The following changes have been made:  See above Labs/ tests ordered today include:see above  Disposition:  FU:  see above  Signed, Nada Boozer, NP  04/29/2017 9:34 PM    [facility] [address] [place], [place], [place]  [address]/ [address] [place], [place] Phone: [phone]; Fax: [phone]  [phone]

## 2017-04-30 ENCOUNTER — Ambulatory Visit: Payer: Medicaid Other | Admitting: Cardiology

## 2017-05-01 ENCOUNTER — Encounter: Payer: Self-pay | Admitting: Cardiology

## 2017-05-23 ENCOUNTER — Emergency Department (HOSPITAL_COMMUNITY)
Admission: EM | Admit: 2017-05-23 | Discharge: 2017-05-23 | Disposition: A | Discharge status: Home / Self Care | Payer: Medicaid Other | Attending: Emergency Medicine | Admitting: Emergency Medicine

## 2017-05-23 ENCOUNTER — Encounter (HOSPITAL_COMMUNITY): Payer: Self-pay | Admitting: Emergency Medicine

## 2017-05-23 ENCOUNTER — Emergency Department (HOSPITAL_COMMUNITY): Payer: Medicaid Other

## 2017-05-23 ENCOUNTER — Other Ambulatory Visit (HOSPITAL_COMMUNITY): Payer: Self-pay

## 2017-05-23 DIAGNOSIS — S2231XA Fracture of one rib, right side, initial encounter for closed fracture: Secondary | ICD-10-CM

## 2017-05-23 DIAGNOSIS — Z79899 Other long term (current) drug therapy: Secondary | ICD-10-CM | POA: Insufficient documentation

## 2017-05-23 DIAGNOSIS — M79601 Pain in right arm: Secondary | ICD-10-CM

## 2017-05-23 DIAGNOSIS — Y92410 Unspecified street and highway as the place of occurrence of the external cause: Secondary | ICD-10-CM | POA: Diagnosis not present

## 2017-05-23 DIAGNOSIS — I110 Hypertensive heart disease with heart failure: Secondary | ICD-10-CM | POA: Diagnosis not present

## 2017-05-23 DIAGNOSIS — F17210 Nicotine dependence, cigarettes, uncomplicated: Secondary | ICD-10-CM | POA: Diagnosis not present

## 2017-05-23 DIAGNOSIS — M542 Cervicalgia: Secondary | ICD-10-CM | POA: Diagnosis not present

## 2017-05-23 DIAGNOSIS — I5040 Unspecified combined systolic (congestive) and diastolic (congestive) heart failure: Secondary | ICD-10-CM | POA: Diagnosis not present

## 2017-05-23 DIAGNOSIS — Y939 Activity, unspecified: Secondary | ICD-10-CM | POA: Insufficient documentation

## 2017-05-23 DIAGNOSIS — Y999 Unspecified external cause status: Secondary | ICD-10-CM | POA: Diagnosis not present

## 2017-05-23 DIAGNOSIS — S299XXA Unspecified injury of thorax, initial encounter: Secondary | ICD-10-CM | POA: Diagnosis present

## 2017-05-23 MED ORDER — TRAMADOL HCL 50 MG PO TABS: 50.0000 mg | ORAL_TABLET | Freq: Four times a day (QID) | ORAL | 0 refills | Status: DC | PRN

## 2017-05-23 MED ORDER — FENTANYL CITRATE (PF) 100 MCG/2ML IJ SOLN: 25.0000 ug | Freq: Once | INTRAMUSCULAR | Status: DC

## 2017-05-23 MED ORDER — MORPHINE SULFATE (PF) 4 MG/ML IV SOLN
4.0000 mg | Freq: Once | INTRAVENOUS | Status: AC
  Administered 2017-05-23: 4 mg via INTRAVENOUS
  Filled 2017-05-23: qty 1
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
## 2017-05-23 NOTE — ED Notes (Signed)
ED Provider at bedside. 
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
## 2017-06-05 ENCOUNTER — Encounter: Payer: Self-pay | Admitting: Cardiovascular Disease

## 2017-06-18 ENCOUNTER — Ambulatory Visit: Payer: Medicaid Other | Admitting: Cardiovascular Disease

## 2017-06-19 ENCOUNTER — Telehealth: Payer: Self-pay | Admitting: Cardiovascular Disease

## 2017-06-19 ENCOUNTER — Other Ambulatory Visit: Payer: Self-pay | Admitting: *Deleted

## 2017-06-19 MED ORDER — CARVEDILOL 3.125 MG PO TABS: 3.1250 mg | ORAL_TABLET | Freq: Two times a day (BID) | ORAL | 0 refills | Status: DC

## 2017-06-19 MED ORDER — LOSARTAN POTASSIUM 25 MG PO TABS: 25.0000 mg | ORAL_TABLET | Freq: Every day | ORAL | 0 refills | Status: DC

## 2017-06-19 MED ORDER — FUROSEMIDE 20 MG PO TABS: 20.0000 mg | ORAL_TABLET | Freq: Every day | ORAL | 0 refills | Status: DC

## 2017-06-19 MED ORDER — POTASSIUM CHLORIDE CRYS ER 10 MEQ PO TBCR: 10.0000 meq | EXTENDED_RELEASE_TABLET | Freq: Every day | ORAL | 0 refills | Status: DC

## 2017-06-19 NOTE — Telephone Encounter (Signed)
°*  STAT* If patient is at the pharmacy, call can be transferred to refill team.   1. Which medications need to be refilled? (please list name of each medication and dose if known) pt said she is out of all of her heart medications   2. Which pharmacy/location (including street and city if local pharmacy) is medication to be sent to? [facility] on [address] 3. Do they need a 30 day or 90 day supply? 90

## 2017-06-19 NOTE — Telephone Encounter (Signed)
Ferrous sulfate should be ordered by PCP or GYN who will monitor iron levels

## 2017-06-19 NOTE — Telephone Encounter (Signed)
Okay to refill the ferrous sulfate as well?

## 2017-06-19 NOTE — Telephone Encounter (Signed)
Spoke with patient and made her aware of message in regards to refilling the ferrous sulfate. She is aware that her cardiac meds have been sent in for #30 and that further refills can be sent in at her upcoming office visit. She verbalized her understanding and was appreciative of my call.

## 2017-06-19 NOTE — Telephone Encounter (Signed)
Patient may receive #30 and keep f/u appointment in October with Demetra Shiner, PA

## 2017-06-19 NOTE — Telephone Encounter (Signed)
Patient had an appointment scheduled yesterday with Dr Elease Hashimoto but did not come. Okay to all refill cardiac meds for ninety days as requested under Dr Elease Hashimoto? Please advise. Thanks, MI

## 2017-07-03 NOTE — Progress Notes (Deleted)
Cardiology Office Note    Date:  07/03/2017   ID:  Lisa THINNES, DOB July 22, 1986, MRN [id number]  PCP:  Patient, No Pcp Per  Cardiologist:  Dr. Anne Fu vs Dr. Elease Hashimoto  Chief Complaint: Hospital follow up s/p    /  Months follow up  History of Present Illness:   Lisa Vazquez is a 31 y.o. female with hx of post partum cardiomyopathy and HTN presents for follow up.   She was admtted 02/2017 with acute systolic HF and her EF was 35-40% at bedside in ER.  No PE on CTA.   On complete 2 D echo EF was 25%.  Mild LVH , diffuse hypokinesis with inferolateral akinesis, LV cavity was moderately dilated. LV end diastolic pressure most likely 20 mmHg. She had moderate MR, LA was moderately to severely dilated. PA pk pressure 38 mmHG PR pressure 8 mmHg.   She was doing well on cardiac stand point when last seen by APP 03/30/17. Her cardiac meds continued.   Past Medical History:  Diagnosis Date  . Acute CHF (HCC) 03/15/2017  . Atypical squamous cell changes of undetermined significance (ASCUS) on cervical cytology with positive high risk human papilloma virus (HPV) 03/17/2013   12/2012 pap smear ASCUS +HPV-  03/17/2013 colpo- Needs Colpo postpartum 2018  . Bacterial vaginal infection 04/06/2013  . Bacterial vaginosis 04/06/2013  . BV (bacterial vaginosis)   . CHF (congestive heart failure) (HCC)   . Chronic hypertension during pregnancy, antepartum 07/17/2016    Aspirin 81 mg daily after 12 weeks; discontinue after 36 weeks--Rx sent Current antihypertensives:  None   Baseline and surveillance labs (pulled in from [facility], refresh links as needed)  Lab Results Component Value Date  PLT 240 07/05/2016  CREATININE 0.74 07/17/2016  AST 11 07/17/2016  ALT 5 07/17/2016  Baseline PCR 347  Antenatal Testing CHTN - O10.919  Group I  BP < 140/90, no preeclampsia, AGA,  nml AFV, +/- meds    Group II BP > 140/90, on meds, no preeclampsia, AGA, nml AFV  20-28-34-38  20-24-28-32-35-38  32//2 x wk   28//BPP wkly then 32//2 x wk  40 no meds; 39 meds  PRN or 37 Pre-eclampsia  GHTN - O13.9/Preeclampsia without severe features  - O14.00   Preeclampsia with severe features - O14.10  Q 3-4wks  Q 2 wks  28//BPP wkly then 32//2 x wk  Inpatient  37  PRN or 34    . CIN I (cervical intraepithelial neoplasia I) 04/06/2013  . Essential hypertension 03/08/2017  . Gestational diabetes    gestational  . 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   . History of cesarean delivery 02/03/2017  . History of recurrent UTIs 07/24/2016   Need UTI suppression  Ask if she took Keflex 07/06/16. Culture Positive 07/17/16. Not she if this is Tx failure or failure to take ABX.   Patient did finally take ATB- she is now on suppression  . Hypertension   . Kidney infection   . Marijuana use 07/26/2016   Pos 07/11/16  . Postpartum care and examination 03/18/2017  . 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 Systolic and diastolic CHF, acute (HCC) 03/08/2017  . UTI (lower urinary tract infection)     Past Surgical History:  Procedure Laterality Date  . CESAREAN SECTION     x3  . CESAREAN SECTION WITH BILATERAL TUBAL LIGATION Bilateral 02/04/2017   Procedure: REPEAT CESAREAN SECTION WITH BILATERAL TUBAL LIGATION;  Surgeon: Brookhurst Bing, MD;  Location: [facility] BIRTHING SUITES;  Service: Obstetrics;  Laterality: Bilateral;    Current Medications: Prior to Admission medications   Medication Sig Start Date End Date Taking? Authorizing Provider  acetaminophen (TYLENOL) 500 MG tablet Take 1,000 mg by mouth every 6 (six) hours as needed for mild pain or headache.    [provider]  buprenorphine (SUBUTEX) 8 MG SUBL SL tablet Place 8 mg under the tongue daily.     [provider]  carvedilol (COREG) 3.125 MG tablet Take 1 tablet (3.125 mg total) by mouth 2 (two) times daily with a meal. 06/19/17   Nahser, Deloris Ping, MD  ferrous sulfate 325 (65 FE) MG tablet Take 1  tablet (325 mg total) by mouth 2 (two) times daily with a meal. 03/17/17   Noralee Stain Chahn-Yang, DO  furosemide (LASIX) 20 MG tablet Take 1 tablet (20 mg total) by mouth daily. 06/19/17   Nahser, Deloris Ping, MD  losartan (COZAAR) 25 MG tablet Take 1 tablet (25 mg total) by mouth daily. 06/19/17 07/19/17  Nahser, Deloris Ping, MD  polyethylene glycol [facility] / Ethelene Hal) packet Take 17 g by mouth daily. 02/16/17   Lorne Skeens, MD  potassium chloride (K-DUR,KLOR-CON) 10 MEQ tablet Take 1 tablet (10 mEq total) by mouth daily. 06/19/17   Nahser, Deloris Ping, MD  traMADol (ULTRAM) 50 MG tablet Take 1 tablet (50 mg total) by mouth every 6 (six) hours as needed. 05/23/17   Maczis, Elmer Sow, PA-C    Allergies:   Patient has no known allergies.   Social History   Social History  . Marital status: Single    Spouse name: N/A  . Number of children: N/A  . Years of education: N/A   Social History Main Topics  . Smoking status: Current Some Day Smoker    Packs/day: 0.25    Years: 7.00    Types: Cigarettes  . Smokeless tobacco: Former Neurosurgeon     Comment: 3 cigs/day, has used vape pens  . Alcohol use No  . Drug use: No     Comment: Subutex  . Sexual activity: Not Currently    Birth control/ protection: Abstinence   Other Topics Concern  . Not on file   Social History Narrative  . No narrative on file     Family History:  The patient's family history includes Asthma in her brother; Heart disease in her father; Hypertension in her mother. ***  ROS:   Please see the history of present illness.    ROS All other systems reviewed and are negative.   PHYSICAL EXAM:   VS:  There were no vitals taken for this visit.   GEN: Well nourished, well developed, in no acute distress  HEENT: normal  Neck: no JVD, carotid bruits, or masses Cardiac: ***RRR; no murmurs, rubs, or gallops,no edema  Respiratory:  clear to auscultation bilaterally, normal work of breathing GI: soft, nontender,  nondistended, + BS MS: no deformity or atrophy  Skin: warm and dry, no rash Neuro:  Alert and Oriented x 3, Strength and sensation are intact Psych: euthymic mood, full affect  Wt Readings from Last 3 Encounters:  03/30/17 155 lb (70.3 kg)  03/18/17 156 lb (70.8 kg)  03/17/17 153 lb 8 oz (69.6 kg)      Studies/Labs Reviewed:   EKG:  EKG is ordered today.  The ekg ordered today demonstrates ***  Recent Labs: 03/09/2017: Magnesium 1.8 03/15/2017: ALT 9; B Natriuretic Peptide 1,580.3; TSH  0.750 03/17/2017: Hemoglobin 11.4; Platelets 262 03/30/2017: BUN 9; Creatinine, Ser 1.09; Potassium 4.2; Sodium 141   Lipid Panel    Component Value Date/Time   CHOL 154 04/06/2013 1151   TRIG 42 04/06/2013 1151   HDL 72 04/06/2013 1151   CHOLHDL 2.1 04/06/2013 1151   VLDL 8 04/06/2013 1151   LDLCALC 74 04/06/2013 1151    Additional studies/ records that were reviewed today include:     ECHO 03/09/2017 Study Conclusions  - Left ventricle: The cavity size was moderately dilated. Wall thickness was increased in a pattern of mild LVH. Diffuse hypokinesis with inferolateral akinesis. The estimated ejection fraction was 25%. Doppler parameters are consistent with restrictive physiology, indicative of decreased left ventricular diastolic compliance and/or increased left atrial pressure. E/medial e&' >15, suggesting LV end diastolic pressure at least 20 mmHg. - Aortic valve: There was no stenosis. - Mitral valve: There was moderate regurgitation, suspect functional. - Left atrium: The atrium was moderately to severely dilated. - Right ventricle: The cavity size was normal. Systolic function was mildly reduced. - Tricuspid valve: Peak RV-RA gradient (S): 30 mm Hg. - Pulmonary arteries: PA peak pressure: 38 mm Hg (S). - Systemic veins: IVC measured 1.6 cm with < 50% respirophasic variation, suggesting RA pressure 8 mmHg.  Impressions:  - Moderately dilated LV with  mild LV hypertrophy. EF 25%, diffuse hypokinesis with inferolateral akinesis. Restrictive diastolic function with evidence for elevated LV filling pressure. Normal RV size with mildly decreased systolic function. Moderate central regurgitation, likely functional. Mild pulmonary hypertension.    ASSESSMENT & PLAN:    1. 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 Tobacco smoking  3. HTN  Medication Adjustments/Labs and Tests Ordered: Current medicines are reviewed at length with the patient today.  Concerns regarding medicines are outlined above.  Medication changes, Labs and Tests ordered today are listed in the Patient Instructions below. There are no Patient Instructions on file for this visit.   Lorelei Pont, [place]  07/03/2017 1:53 PM    [facility] [address] [place], [place], [place]  [address] Phone: [phone]; Fax: [phone]

## 2017-07-06 ENCOUNTER — Ambulatory Visit: Payer: Medicaid Other | Admitting: Physician Assistant

## 2018-01-18 ENCOUNTER — Emergency Department (HOSPITAL_COMMUNITY)
Admission: EM | Admit: 2018-01-18 | Discharge: 2018-01-18 | Disposition: A | Discharge status: Home / Self Care | Payer: Medicaid Other | Attending: Emergency Medicine | Admitting: Emergency Medicine

## 2018-01-18 ENCOUNTER — Other Ambulatory Visit: Payer: Self-pay

## 2018-01-18 ENCOUNTER — Emergency Department (HOSPITAL_COMMUNITY): Payer: Medicaid Other

## 2018-01-18 ENCOUNTER — Encounter (HOSPITAL_COMMUNITY): Payer: Self-pay

## 2018-01-18 DIAGNOSIS — Z79899 Other long term (current) drug therapy: Secondary | ICD-10-CM | POA: Insufficient documentation

## 2018-01-18 DIAGNOSIS — R079 Chest pain, unspecified: Secondary | ICD-10-CM | POA: Diagnosis not present

## 2018-01-18 DIAGNOSIS — I5040 Unspecified combined systolic (congestive) and diastolic (congestive) heart failure: Secondary | ICD-10-CM | POA: Diagnosis not present

## 2018-01-18 DIAGNOSIS — F17210 Nicotine dependence, cigarettes, uncomplicated: Secondary | ICD-10-CM | POA: Insufficient documentation

## 2018-01-18 DIAGNOSIS — R0602 Shortness of breath: Secondary | ICD-10-CM | POA: Insufficient documentation

## 2018-01-18 DIAGNOSIS — N3001 Acute cystitis with hematuria: Secondary | ICD-10-CM

## 2018-01-18 DIAGNOSIS — I110 Hypertensive heart disease with heart failure: Secondary | ICD-10-CM | POA: Diagnosis not present

## 2018-01-18 LAB — BASIC METABOLIC PANEL
Anion gap: 7 (ref 5–15)
BUN: 8 mg/dL (ref 6–20)
CO2: 30 mmol/L (ref 22–32)
Calcium: 9.5 mg/dL (ref 8.9–10.3)
Chloride: 103 mmol/L (ref 101–111)
Creatinine, Ser: 0.94 mg/dL (ref 0.44–1.00)
Glucose, Bld: 97 mg/dL (ref 65–99)
Potassium: 4.2 mmol/L (ref 3.5–5.1)
SODIUM: 140 mmol/L (ref 135–145)

## 2018-01-18 LAB — HEPATIC FUNCTION PANEL
ALT: 10 U/L — ABNORMAL LOW (ref 14–54)
AST: 17 U/L (ref 15–41)
Albumin: 4 g/dL (ref 3.5–5.0)
Alkaline Phosphatase: 61 U/L (ref 38–126)
Bilirubin, Direct: 0.1 mg/dL (ref 0.1–0.5)
Indirect Bilirubin: 0.4 mg/dL (ref 0.3–0.9)
Total Bilirubin: 0.5 mg/dL (ref 0.3–1.2)
Total Protein: 7.7 g/dL (ref 6.5–8.1)

## 2018-01-18 LAB — CBC
HCT: 38.3 % (ref 36.0–46.0)
HEMOGLOBIN: 12.5 g/dL (ref 12.0–15.0)
MCH: 25.8 pg — ABNORMAL LOW (ref 26.0–34.0)
MCHC: 32.6 g/dL (ref 30.0–36.0)
MCV: 79.1 fL (ref 78.0–100.0)
Platelets: 281 10*3/uL (ref 150–400)
RBC: 4.84 MIL/uL (ref 3.87–5.11)
RDW: 16 % — ABNORMAL HIGH (ref 11.5–15.5)
WBC: 4.9 10*3/uL (ref 4.0–10.5)

## 2018-01-18 LAB — I-STAT TROPONIN, ED
Troponin i, poc: 0.02 ng/mL (ref 0.00–0.08)
Troponin i, poc: 0.03 ng/mL (ref 0.00–0.08)

## 2018-01-18 LAB — URINALYSIS, ROUTINE W REFLEX MICROSCOPIC
Bilirubin Urine: NEGATIVE
Glucose, UA: NEGATIVE mg/dL
Ketones, ur: NEGATIVE mg/dL
Nitrite: POSITIVE — AB
Protein, ur: NEGATIVE mg/dL
Specific Gravity, Urine: 1.01 (ref 1.005–1.030)
pH: 5 (ref 5.0–8.0)

## 2018-01-18 LAB — BRAIN NATRIURETIC PEPTIDE: B Natriuretic Peptide: 779.6 pg/mL — ABNORMAL HIGH (ref 0.0–100.0)

## 2018-01-18 LAB — I-STAT BETA HCG BLOOD, ED (MC, WL, AP ONLY): I-stat hCG, quantitative: <5 m[IU]/mL (ref <5)

## 2018-01-18 LAB — D-DIMER, QUANTITATIVE (NOT AT ARMC): D-Dimer, Quant: <0.27 ug/mL-FEU (ref 0.00–0.50)

## 2018-01-18 MED ORDER — SULFAMETHOXAZOLE-TRIMETHOPRIM 800-160 MG PO TABS: 1.0000 | ORAL_TABLET | Freq: Two times a day (BID) | ORAL | 0 refills | Status: AC

## 2018-01-18 MED ORDER — MORPHINE SULFATE (PF) 4 MG/ML IV SOLN
4.0000 mg | Freq: Once | INTRAVENOUS | Status: AC
  Administered 2018-01-18: 4 mg via INTRAVENOUS
  Filled 2018-01-18: qty 1

## 2018-01-18 MED ORDER — FUROSEMIDE 10 MG/ML IJ SOLN
40.0000 mg | Freq: Once | INTRAMUSCULAR | Status: AC
  Administered 2018-01-18: 40 mg via INTRAVENOUS
  Filled 2018-01-18: qty 4

## 2018-01-18 NOTE — ED Notes (Signed)
ED Provider at bedside. 

## 2018-01-18 NOTE — ED Provider Notes (Signed)
[facility]-EMERGENCY DEPT Provider Note   CSN: [id number] Arrival date & time: 01/18/18  0857     History   Chief Complaint Chief Complaint  Patient presents with  . Chest Pain    HPI Lisa Vazquez is a 32 y.o. female with history of postpartum cardiomyopathy and CHF who presents with a 3-day history of left-sided chest and back pain.  She describes her pain is sharp.  It is pleuritic.  It is worse with movement.  She has had associated shortness of breath.  She has also had some left upper quadrant abdominal pain.  She denies any nausea or vomiting.  She denies any urinary symptoms she has taken Tylenol at home without relief.  She last saw cardiology in January.  She takes Lasix 20 mg daily.  She reports she has had pain like this in the past related to her CHF.  She reports around the time her pain started, she did have some pain in her right calf, which is now resolved.  She denies any recent long trips, surgeries, known cancer, exogenous estrogen use.  She denies any injury.  She reports she woke up with the pain on the first day feeling like she may have slept wrong, however it has gotten progressively worse over the past few days.  HPI  Past Medical History:  Diagnosis Date  . Acute CHF (HCC) 03/15/2017  . Atypical squamous cell changes of undetermined significance (ASCUS) on cervical cytology with positive high risk human papilloma virus (HPV) 03/17/2013   12/2012 pap smear ASCUS +HPV-  03/17/2013 colpo- Needs Colpo postpartum 2018  . Bacterial vaginal infection 04/06/2013  . Bacterial vaginosis 04/06/2013  . BV (bacterial vaginosis)   . CHF (congestive heart failure) (HCC)   . Chronic hypertension during pregnancy, antepartum 07/17/2016    Aspirin 81 mg daily after 12 weeks; discontinue after 36 weeks--Rx sent Current antihypertensives:  None   Baseline and surveillance labs (pulled in from [facility], refresh links as needed)  Lab Results  Component Value Date  PLT 240 07/05/2016  CREATININE 0.74 07/17/2016  AST 11 07/17/2016  ALT 5 07/17/2016  Baseline PCR 347  Antenatal Testing CHTN - O10.919  Group I  BP < 140/90, no preeclampsia, AGA,  nml AFV, +/- meds    Group II BP > 140/90, on meds, no preeclampsia, AGA, nml AFV  20-28-34-38  20-24-28-32-35-38  32//2 x wk  28//BPP wkly then 32//2 x wk  40 no meds; 39 meds  PRN or 37 Pre-eclampsia  GHTN - O13.9/Preeclampsia without severe features  - O14.00   Preeclampsia with severe features - O14.10  Q 3-4wks  Q 2 wks  28//BPP wkly then 32//2 x wk  Inpatient  37  PRN or 34    . CIN I (cervical intraepithelial neoplasia I) 04/06/2013  . Essential hypertension 03/08/2017  . Gestational diabetes    gestational  . 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   . History of cesarean delivery 02/03/2017  . History of recurrent UTIs 07/24/2016   Need UTI suppression  Ask if she took Keflex 07/06/16. Culture Positive 07/17/16. Not she if this is Tx failure or failure to take ABX.   Patient did finally take ATB- she is now on suppression  . Hypertension   . Kidney infection   . Marijuana use 07/26/2016   Pos 07/11/16  . Postpartum care and examination 03/18/2017  . 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  Systolic and diastolic CHF, acute (HCC) 03/08/2017  . UTI (lower urinary tract infection)     Patient Active Problem List   Diagnosis Date Noted  . Postpartum care and examination 03/18/2017  . Acute CHF (HCC) 03/15/2017  . 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 Systolic and diastolic CHF, acute (HCC) 03/08/2017  . Essential hypertension 03/08/2017  . CHF (congestive heart failure) (HCC) 03/08/2017  . History of cesarean delivery 02/03/2017  . Marijuana use 07/26/2016  . 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 High blood hemoglobin F (HCC) 07/26/2016  . CIN I (cervical intraepithelial neoplasia I)  04/06/2013  . Atypical squamous cell changes of undetermined significance (ASCUS) on cervical cytology with positive high risk human papilloma virus (HPV) 03/17/2013    Past Surgical History:  Procedure Laterality Date  . CESAREAN SECTION     x3  . CESAREAN SECTION WITH BILATERAL TUBAL LIGATION Bilateral 02/04/2017   Procedure: REPEAT CESAREAN SECTION WITH BILATERAL TUBAL LIGATION;  Surgeon: Biwabik Bing, MD;  Location: [facility] BIRTHING SUITES;  Service: Obstetrics;  Laterality: Bilateral;     OB History    Gravida  5   Para  4   Term  2   Preterm  2   AB  1   Living  4     SAB      TAB  1   Ectopic  0   Multiple  0   Live Births  4            Home Medications    Prior to Admission medications   Medication Sig Start Date End Date Taking? Authorizing Provider  acetaminophen (TYLENOL) 500 MG tablet Take 1,000 mg by mouth every 6 (six) hours as needed for mild pain or headache.   Yes [provider]  buprenorphine (SUBUTEX) 8 MG SUBL SL tablet Place 8 mg under the tongue daily.    Yes [provider]  carvedilol (COREG) 3.125 MG tablet Take 1 tablet (3.125 mg total) by mouth 2 (two) times daily with a meal. 06/19/17  Yes Nahser, Deloris Ping, MD  furosemide (LASIX) 20 MG tablet Take 1 tablet (20 mg total) by mouth daily. 06/19/17  Yes Nahser, Deloris Ping, MD  ferrous sulfate 325 (65 FE) MG tablet Take 1 tablet (325 mg total) by mouth 2 (two) times daily with a meal. Patient not taking: Reported on 01/18/2018 03/17/17   Noralee Stain, DO  losartan (COZAAR) 25 MG tablet Take 1 tablet (25 mg total) by mouth daily. 06/19/17 07/19/17  Nahser, Deloris Ping, MD  polyethylene glycol [facility] / Ethelene Hal) packet Take 17 g by mouth daily. Patient not taking: Reported on 01/18/2018 02/16/17   Lorne Skeens, MD  potassium chloride (K-DUR,KLOR-CON) 10 MEQ tablet Take 1 tablet (10 mEq total) by mouth daily. Patient not taking: Reported on 01/18/2018 06/19/17   Nahser,  Deloris Ping, MD  sulfamethoxazole-trimethoprim (BACTRIM DS,SEPTRA DS) 800-160 MG tablet Take 1 tablet by mouth 2 (two) times daily for 14 days. 01/18/18 02/01/18  Jamarl Pew, Waylan Boga, PA-C  traMADol (ULTRAM) 50 MG tablet Take 1 tablet (50 mg total) by mouth every 6 (six) hours as needed. Patient not taking: Reported on 01/18/2018 05/23/17   Jacinto Halim, PA-C    Family History Family History  Problem Relation Age of Onset  . Hypertension Mother   . Heart disease Father   . Asthma Brother     Social History Social History   Tobacco Use  . Smoking status:  Current Some Day Smoker    Packs/day: 0.25    Years: 7.00    Pack years: 1.75    Types: Cigarettes  . Smokeless tobacco: Former Neurosurgeon  . Tobacco comment: 3 cigs/day, has used vape pens  Substance Use Topics  . Alcohol use: No  . Drug use: No    Comment: Subutex     Allergies   Patient has no known allergies.   Review of Systems Review of Systems  Constitutional: Negative for chills and fever.  HENT: Negative for facial swelling and sore throat.   Respiratory: Positive for shortness of breath.   Cardiovascular: Positive for chest pain.  Gastrointestinal: Positive for abdominal pain. Negative for nausea and vomiting.  Genitourinary: Negative for dysuria.  Musculoskeletal: Positive for back pain.  Skin: Negative for rash and wound.  Neurological: Negative for headaches.  Psychiatric/Behavioral: The patient is not nervous/anxious.      Physical Exam Updated Vital Signs BP (!) 130/94   Pulse 73   Temp 97.9 F (36.6 C) (Oral)   Resp 14   Ht  (1.626 m)   Wt 69.4 kg (153 lb)   LMP 12/26/2017   SpO2 100%   BMI 26.26 kg/m   Physical Exam  Constitutional: She appears well-developed and well-nourished. No distress.  Patient eating [employer] when I walked in the room  HENT:  Head: Normocephalic and atraumatic.  Mouth/Throat: Oropharynx is clear and moist. No oropharyngeal exudate.  Eyes: Pupils are equal,  round, and reactive to light. Conjunctivae are normal. Right eye exhibits no discharge. Left eye exhibits no discharge. No scleral icterus.  Neck: Normal range of motion. Neck supple. No thyromegaly present.  Cardiovascular: Normal rate, regular rhythm, normal heart sounds and intact distal pulses. Exam reveals no gallop and no friction rub.  No murmur heard. Pulmonary/Chest: Effort normal and breath sounds normal. No stridor. No respiratory distress. She has no wheezes. She has no rales. She exhibits tenderness.    Abdominal: Soft. Bowel sounds are normal. She exhibits no distension. There is tenderness in the left upper quadrant. There is CVA tenderness (L). There is no rebound and no guarding.  Musculoskeletal: She exhibits no edema.       Right lower leg: She exhibits no tenderness and no edema.       Left lower leg: She exhibits no tenderness and no edema.  Lymphadenopathy:    She has no cervical adenopathy.  Neurological: She is alert. Coordination normal.  Skin: Skin is warm and dry. No rash noted. She is not diaphoretic. No pallor.  Psychiatric: She has a normal mood and affect.  Nursing note and vitals reviewed.    ED Treatments / Results  Labs (all labs ordered are listed, but only abnormal results are displayed) Labs Reviewed  CBC - Abnormal; Notable for the following components:      Result Value   MCH 25.8 (*)    RDW 16.0 (*)    All other components within normal limits  BRAIN NATRIURETIC PEPTIDE - Abnormal; Notable for the following components:   B Natriuretic Peptide 779.6 (*)    All other components within normal limits  HEPATIC FUNCTION PANEL - Abnormal; Notable for the following components:   ALT 10 (*)    All other components within normal limits  URINALYSIS, ROUTINE W REFLEX MICROSCOPIC - Abnormal; Notable for the following components:   APPearance HAZY (*)    Hgb urine dipstick SMALL (*)    Nitrite POSITIVE (*)    Leukocytes, UA  TRACE (*)    Bacteria, UA  RARE (*)    All other components within normal limits  URINE CULTURE  BASIC METABOLIC PANEL  D-DIMER, QUANTITATIVE (NOT AT [facility])  I-STAT TROPONIN, ED  I-STAT BETA HCG BLOOD, ED ([facility], [facility], [facility] ONLY)  I-STAT TROPONIN, ED    EKG EKG Interpretation  Date/Time:  Monday January 18 2018 09:09:21 EDT Ventricular Rate:  76 PR Interval:    QRS Duration: 107 QT Interval:  438 QTC Calculation: 493 R Axis:   55 Text Interpretation:  Sinus rhythm Probable left atrial enlargement Left ventricular hypertrophy Borderline prolonged QT interval No acute changes Confirmed by Derwood Kaplan ([id number]) on 01/18/2018 3:27:39 PM   Radiology Dg Chest 2 View  Result Date: 01/18/2018 CLINICAL DATA:  Chest pain and shortness of breath EXAM: CHEST - 2 VIEW COMPARISON:  May 23, 2017 FINDINGS: There is no edema or consolidation. There is cardiomegaly with pulmonary vascularity within normal limits. No adenopathy. No bone lesions. IMPRESSION: Stable cardiomegaly.  No edema or consolidation. Electronically Signed   By: Bretta Bang III M.D.   On: 01/18/2018 10:20    Procedures Procedures (including critical care time)  Medications Ordered in ED Medications  morphine 4 MG/ML injection 4 mg (4 mg Intravenous Given 01/18/18 1410)  furosemide (LASIX) injection 40 mg (40 mg Intravenous Given 01/18/18 1523)     Initial Impression / Assessment and Plan / ED Course  I have reviewed the triage vital signs and the nursing notes.  Pertinent labs & imaging results that were available during my care of the patient were reviewed by me and considered in my medical decision making (see chart for details).  Clinical Course as of Jan 18 1610  Mon Jan 18, 2018  1604 On reevaluation after morphine 4 mg and Lasix 40 mg, patient states she is feeling better.  She is wanting to leave to get to a prior engagement.   [AL]    Clinical Course User Index [AL] Emi Holes, PA-C    Patient with atypical chest pain and  left flank pain.  Patient does have urinary tract infection today, which could be early pyelonephritis.  Patient has mild elevation of BNP, 729.6.  Patient given 40 mg Lasix in the ED.  CBC, BMP unremarkable.  Delta troponin is negative.  D-dimer is negative.  Chest x-ray is negative for edema or consolidation, stable cardiomegaly.  EKG shows NSR, no acute changes.  UA shows positive nitrites, small hematuria, trace leukocytes, 6-10 WBCs.  Urine culture sent.  Patient has had similar symptoms in the past.  She was told by her cardiologist that she could have musculoskeletal pain, which would make sense related to feeling like she slept wrong the first day.  Patient reports she cannot take NSAIDs.  I advised ice and continue Tylenol.  Will treat with Bactrim for potentially early pyelonephritis.  Patient advised she should call her cardiologist for follow-up, as she has not seen them in several months.  Strict return precautions given.  Patient has a rush to leave.  She understands and agrees with plan.  Patient vitals stable throughout ED course and discharged in satisfactory condition.  Final Clinical Impressions(s) / ED Diagnoses   Final diagnoses:  Acute cystitis with hematuria  Nonspecific chest pain    ED Discharge Orders        Ordered    sulfamethoxazole-trimethoprim (BACTRIM DS,SEPTRA DS) 800-160 MG tablet  2 times daily     01/18/18 1557  Emi Holes, PA-C 01/18/18 1612    Derwood Kaplan, MD 01/19/18 [phone]

## 2018-01-18 NOTE — ED Notes (Signed)
Pt aware of need for urine specimen. RN aware.

## 2018-01-18 NOTE — ED Triage Notes (Signed)
Patient c/o constant left chest x 2 days. Patient states she has a history of CHF. Patient states she is now having pain that extends into the left rib cage area pain

## 2018-01-18 NOTE — Discharge Instructions (Addendum)
Medications: Bactrim  Treatment: Take Bactrim twice daily for your urinary tract infection.  Take Tylenol as prescribed over-the-counter, as needed for your pain.  Use ice to your chest and back 3-4 times daily alternating 20 minutes on, 20 minutes off.  Follow-up: Please follow-up with your cardiologist for further evaluation and treatment as soon as possible.  Please return to the emergency department immediately if you develop any new or worsening symptoms including severe chest pain, shortness of breath, or any other concerning symptoms.

## 2018-01-18 NOTE — ED Notes (Signed)
Pt has removed all monitoring equipment and is waiting at door for discharge paperwork. Pt has stated multiple times that she has to leave at 4 pm. PA aware

## 2018-01-20 LAB — URINE CULTURE: Culture: >=100000 — AB

## 2018-01-21 ENCOUNTER — Telehealth: Payer: Self-pay | Admitting: *Deleted

## 2018-01-21 NOTE — Telephone Encounter (Signed)
Post ED Visit - Positive Culture Follow-up: Successful Patient Follow-Up  Culture assessed and recommendations reviewed by:  Enzo Bi, Pharm.D.  Celedonio Miyamoto, Pharm.D., BCPS AQ-ID  Garvin Fila, Pharm.D., BCPS  Georgina Pillion, [address].D., BCPS  [place], [address].D., BCPS, AAHIVP  Estella Husk, Pharm.D., BCPS, AAHIVP  Lysle Pearl, PharmD, BCPS  Sherlynn Carbon, PharmD  Pollyann Samples, PharmD, BCPS  Positive urine culture   Patient discharged without antimicrobial prescription and treatment is now indicated  Organism is resistant to prescribed ED discharge antimicrobial  Patient with positive blood cultures  Changes discussed with ED provider Cortni Couture, PA-C New antibiotic prescription Cephalexin  PO QID x 10 days Called to [facility], [phone]  Contacted patient, date 01/21/2018, time 1050   Lysle Pearl 01/21/2018, 10:50 AM

## 2018-02-19 IMAGING — CT CT ANGIO CHEST
2 of 6 series · 18 of 36 positions shown · IV contrast (ISOVUE 370)
Comparison: Chest radiograph 03/08/2017

CLINICAL DATA: Chest pain, shortness of breath, cough.  Postpartum.

EXAM:
CT ANGIOGRAPHY CHEST WITH CONTRAST
TECHNIQUE: Multidetector CT imaging of the chest was performed using the
standard protocol during bolus administration of intravenous
contrast. Multiplanar CT image reconstructions and MIPs were
obtained to evaluate the vascular anatomy.
CONTRAST:  100 cc Isovue 370 intravenously.

[Series 6: coronal mpr · coronal · 0.54mm/px · 1 of 116 slices shown]
[im 58/116  mediastinal]
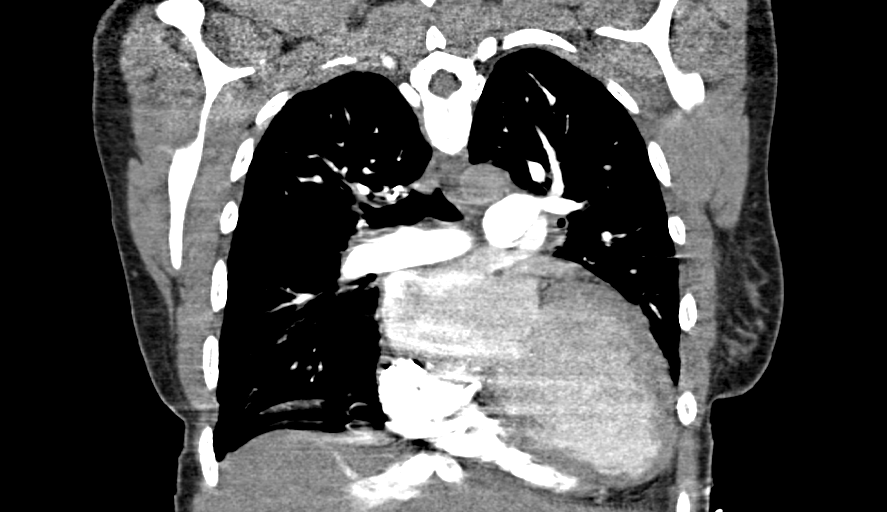

[Series 11: thins for pacs · axial · 0.70mm/px · z∈[+1626,+1876]mm · 17 of 278 slices shown]
[im 14/278  lung]
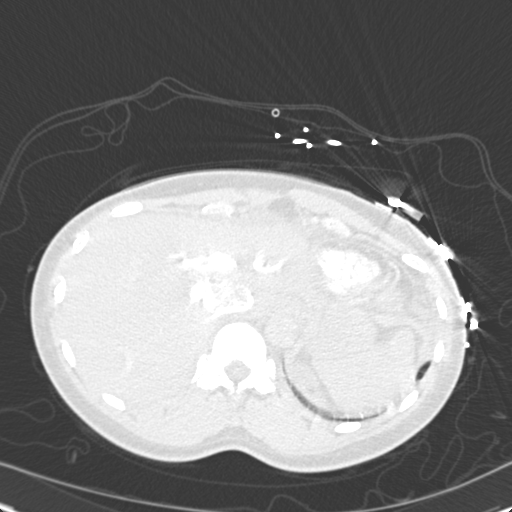
[im 28/278  mediastinal]
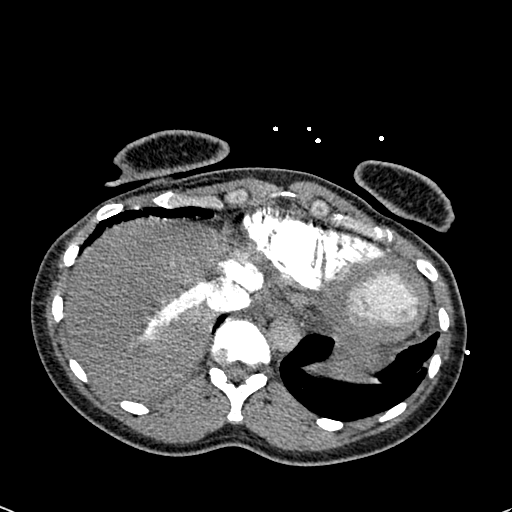
[im 42/278  lung]
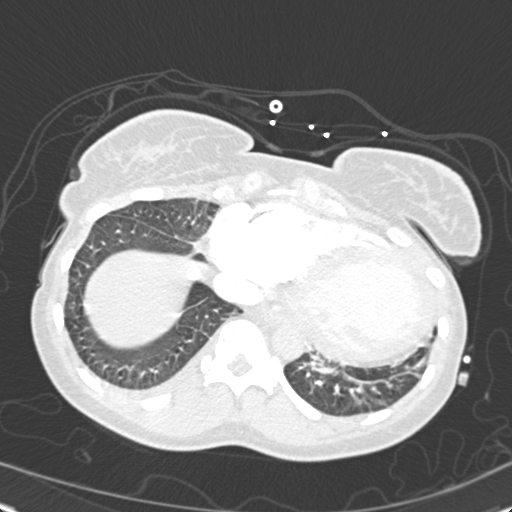
[im 56/278  mediastinal]
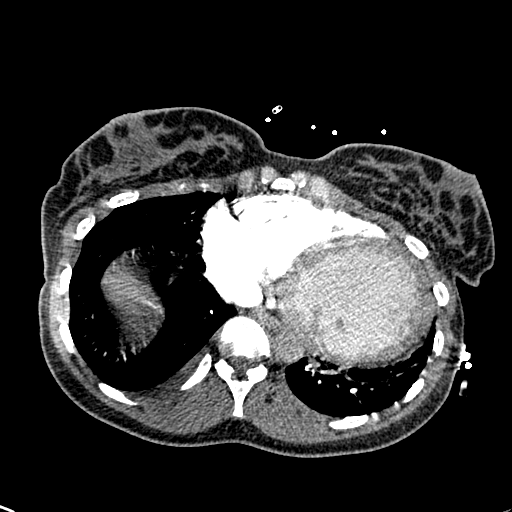
[im 84/278  lung]
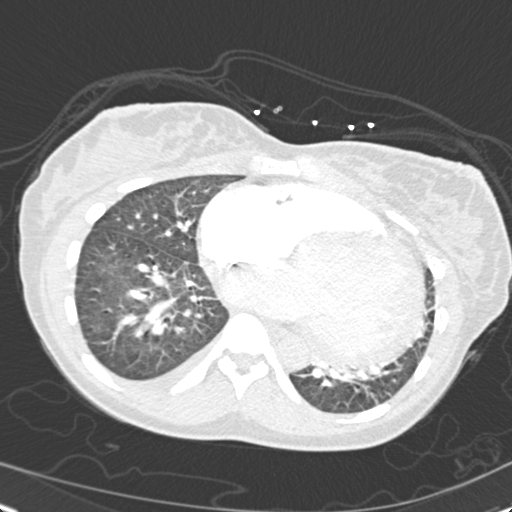
[im 97/278  mediastinal]
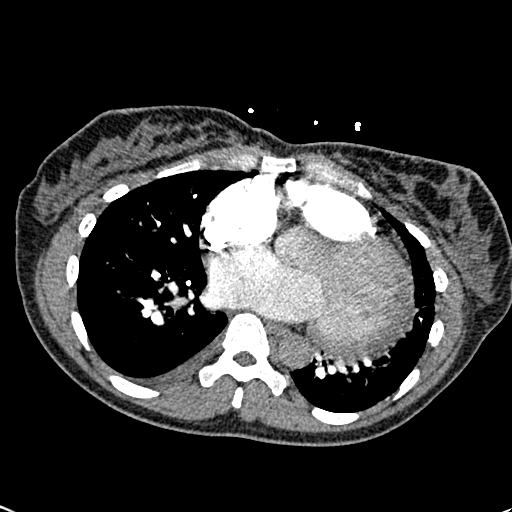
[im 111/278  lung]
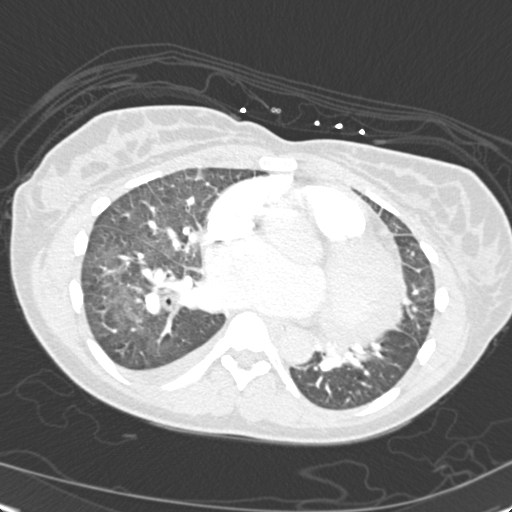
[im 125/278  mediastinal]
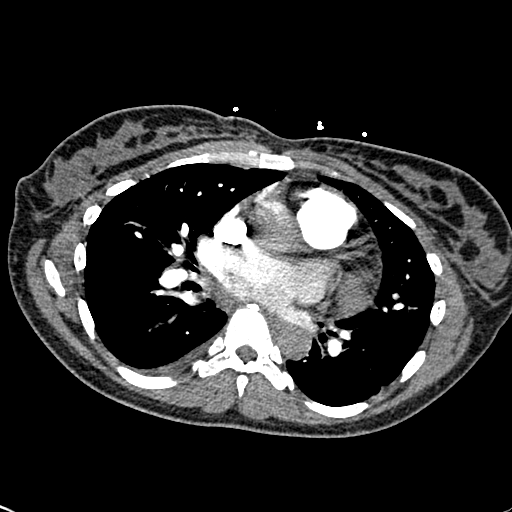
[im 139/278  lung]
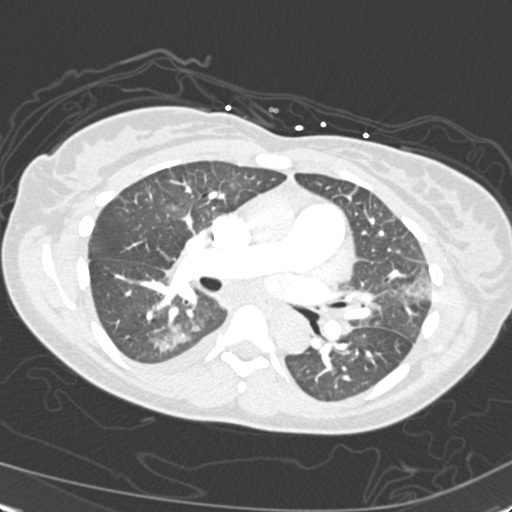
[im 153/278  mediastinal]
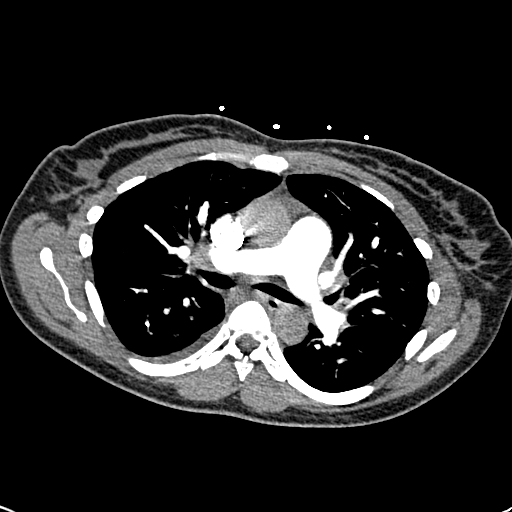
[im 167/278  lung]
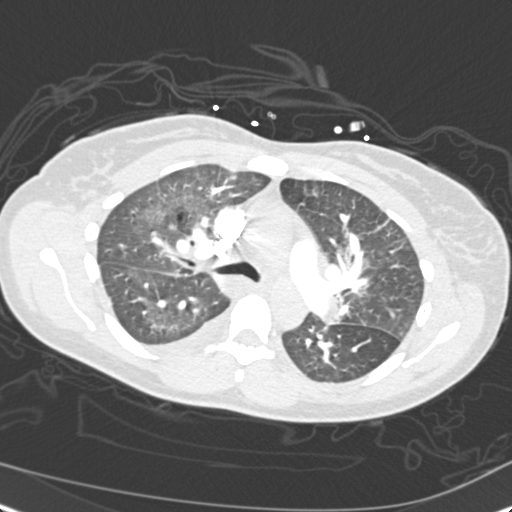
[im 181/278  mediastinal]
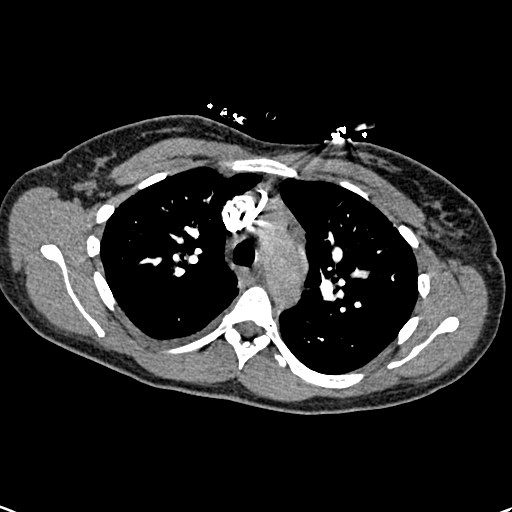
[im 194/278  lung]
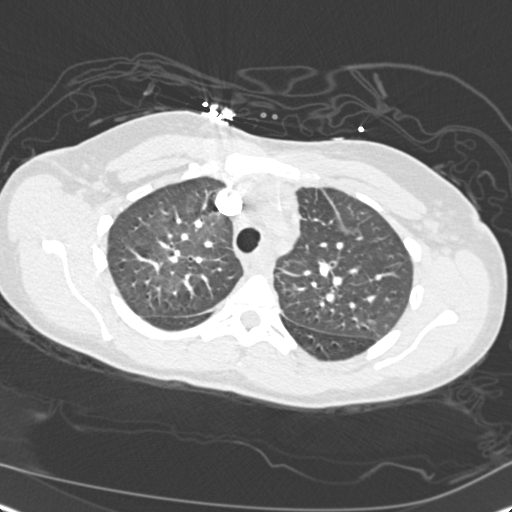
[im 222/278  mediastinal]
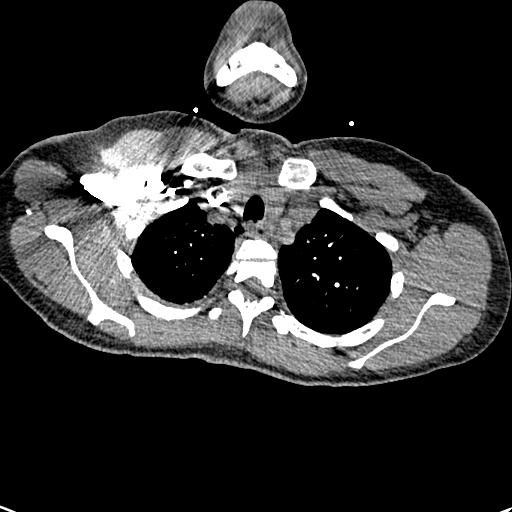
[im 236/278  lung]
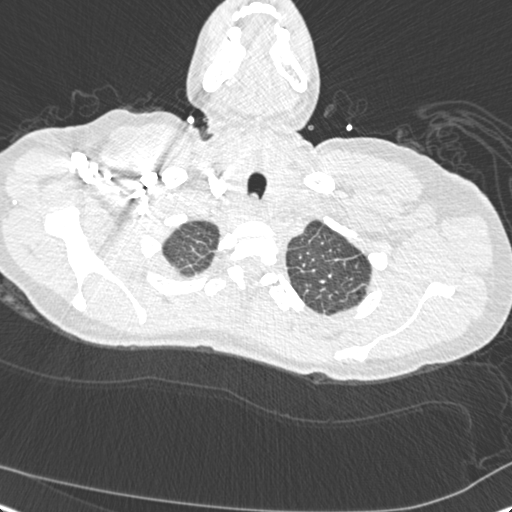
[im 250/278  mediastinal]
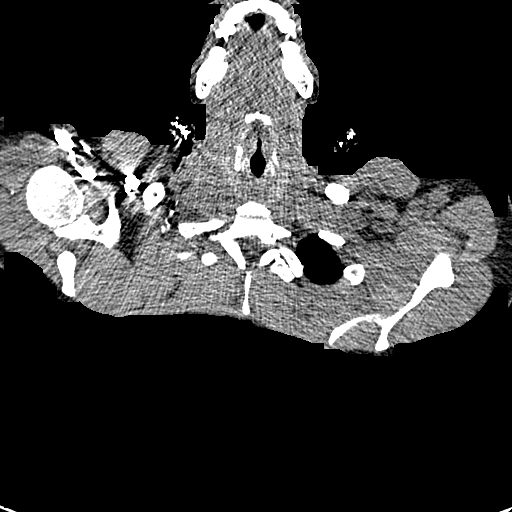
[im 264/278  lung]
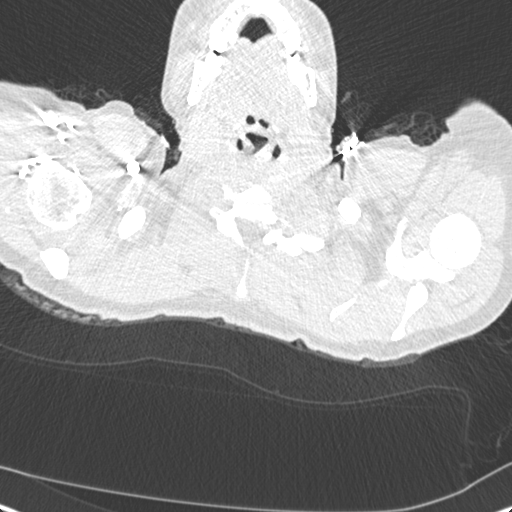

[18 of 36 positions shown; findings below may reference images not displayed]

FINDINGS: Cardiovascular: Satisfactory opacification of the pulmonary arteries
to the segmental level. No evidence of pulmonary embolism. Enlarged
heart. No pericardial effusion. Reflux of contrast to the hepatic
veins.

Mediastinum/Nodes: No enlarged mediastinal, hilar, or axillary lymph
nodes. Thyroid gland, trachea, and esophagus demonstrate no
significant findings.

Lungs/Pleura: Patchy ground-glass airspace consolidation in the
right lower lobe, right upper lobe, left upper lobe, lingula. Small
right pleural effusion. Mild interstitial pulmonary edema.

Upper Abdomen: No acute abnormality.

Musculoskeletal: No chest wall abnormality. No acute or significant
osseous findings.

Review of the MIP images confirms the above findings.
IMPRESSION: No evidence of pulmonary embolus.

Marked cardiomegaly. Query dilated cardiomyopathy. Reflux of
contrast into the hepatic veins, usually associated with right heart
dysfunction. Cardiology consultation is recommended.

Small right pleural effusion.

Mild interstitial pulmonary edema.

More confluent areas of ground-glass airspace opacity in both lungs
may represent superimposed areas of infectious consolidation.

## 2018-06-22 HISTORY — PX: TOOTH EXTRACTION: SUR596

## 2018-07-06 ENCOUNTER — Telehealth: Payer: Self-pay

## 2018-07-06 NOTE — Telephone Encounter (Signed)
    [facility] Pre-operative Risk Assessment    Request for surgical clearance:  1. What type of surgery is being performed?  Extract teeth 1,17,21,30, and 32   2. When is this surgery scheduled?  TBD   3. What type of clearance is required (medical clearance vs. Pharmacy clearance to hold med vs. Both)?  MEDICAL  4. Are there any medications that need to be held prior to surgery and how long?    5. Practice name and name of physician performing surgery?  [facility]   6. What is your office phone number [phone]    7.   What is your office fax number [phone]  8.   Anesthesia type (None, local, MAC, general) ?  general   Frederik Schmidt 07/06/2018, 11:27 AM  _________________________________________________________________   (provider comments below)

## 2018-07-11 ENCOUNTER — Ambulatory Visit (HOSPITAL_COMMUNITY)
Admission: EM | Admit: 2018-07-11 | Discharge: 2018-07-11 | Disposition: A | Discharge status: Home / Self Care | Payer: Medicaid Other

## 2018-07-11 ENCOUNTER — Encounter (HOSPITAL_COMMUNITY): Payer: Self-pay | Admitting: *Deleted

## 2018-07-11 DIAGNOSIS — Z3A08 8 weeks gestation of pregnancy: Secondary | ICD-10-CM

## 2018-07-11 DIAGNOSIS — K0889 Other specified disorders of teeth and supporting structures: Secondary | ICD-10-CM

## 2018-07-11 DIAGNOSIS — Z3201 Encounter for pregnancy test, result positive: Secondary | ICD-10-CM | POA: Diagnosis not present

## 2018-07-11 LAB — POCT PREGNANCY, URINE: PREG TEST UR: POSITIVE — AB

## 2018-07-11 MED ORDER — AMOXICILLIN-POT CLAVULANATE 875-125 MG PO TABS: 1.0000 | ORAL_TABLET | Freq: Two times a day (BID) | ORAL | 0 refills | Status: DC

## 2018-07-11 MED ORDER — HYDROCODONE-ACETAMINOPHEN 5-325 MG PO TABS
ORAL_TABLET | ORAL | Status: AC
  Filled 2018-07-11: qty 2

## 2018-07-11 MED ORDER — HYDROCODONE-ACETAMINOPHEN 5-325 MG PO TABS
2.0000 | ORAL_TABLET | Freq: Once | ORAL | Status: AC
  Administered 2018-07-11: 2 via ORAL

## 2018-07-11 MED ORDER — HYDROCODONE-ACETAMINOPHEN 5-325 MG PO TABS: 1.0000 | ORAL_TABLET | ORAL | 0 refills | Status: DC | PRN

## 2018-07-11 NOTE — Discharge Instructions (Signed)
Please contact her oral surgeon first thing in the morning to follow-up with him regarding your current level of pain and concerns regarding oral surgery.

## 2018-07-11 NOTE — ED Provider Notes (Signed)
[facility]    CSN: [id number] Arrival date & time: 07/11/18  1118     History   Chief Complaint Chief Complaint  Patient presents with  . Dental Pain  . Oral Swelling    HPI Lisa Vazquez is a 32 y.o. female.   HPI  Patient reports having 5 teeth extracted surgically by oral surgeon Dr. Jeanice Lim x6 days ago. She has suspected that she was pregnant and urine pregnancy was positive here in office today.  She reports worsening bilateral lower facial pain mild swelling.  She is out of pain medication that was prescribed after the surgery.  He has attempted to call the after hours oral surgeon number however has been unsuccessful.  Reports that pain is a 10 out of 10.  Past Medical History:  Diagnosis Date  . Acute CHF (HCC) 03/15/2017  . Atypical squamous cell changes of undetermined significance (ASCUS) on cervical cytology with positive high risk human papilloma virus (HPV) 03/17/2013   12/2012 pap smear ASCUS +HPV-  03/17/2013 colpo- [ ] Needs Colpo postpartum 2018  . Bacterial vaginal infection 04/06/2013  . Bacterial vaginosis 04/06/2013  . BV (bacterial vaginosis)   . CHF (congestive heart failure) (HCC)   . Chronic hypertension during pregnancy, antepartum 07/17/2016   [x]  Aspirin 81 mg daily after 12 weeks; discontinue after 36 weeks--Rx sent Current antihypertensives:  None   Baseline and surveillance labs (pulled in from [facility], refresh links as needed)  Lab Results Component Value Date  PLT 240 07/05/2016  CREATININE 0.74 07/17/2016  AST 11 07/17/2016  ALT 5 07/17/2016  Baseline PCR 347  Antenatal Testing CHTN - O10.919  Group I  BP < 140/90, no preeclampsia, AGA,  nml AFV, +/- meds    Group II BP > 140/90, on meds, no preeclampsia, AGA, nml AFV  20-28-34-38  20-24-28-32-35-38  32//2 x wk  28//BPP wkly then 32//2 x wk  40 no meds; 39 meds  PRN or 37 Pre-eclampsia  GHTN - O13.9/Preeclampsia without severe features  - O14.00   Preeclampsia with severe features - O14.10   Q 3-4wks  Q 2 wks  28//BPP wkly then 32//2 x wk  Inpatient  37  PRN or 34    . CIN I (cervical intraepithelial neoplasia I) 04/06/2013  . Essential hypertension 03/08/2017  . Gestational diabetes    gestational  . 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   . History of cesarean delivery 02/03/2017  . History of recurrent UTIs 07/24/2016   [ ] Need UTI suppression  [ ] Ask if she took Keflex 07/06/16. Culture Positive 07/17/16. Not she if this is Tx failure or failure to take ABX.   Patient did finally take ATB- she is now on suppression  . Hypertension   . Kidney infection   . Marijuana use 07/26/2016   Pos 07/11/16  . Postpartum care and examination 03/18/2017  . 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 Systolic and diastolic CHF, acute (HCC) 03/08/2017  . UTI (lower urinary tract infection)     Patient Active Problem List   Diagnosis Date Noted  . Postpartum care and examination 03/18/2017  . Acute CHF (HCC) 03/15/2017  . 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 Systolic and diastolic CHF, acute (HCC) 03/08/2017  . Essential hypertension 03/08/2017  . CHF (congestive heart failure) (HCC) 03/08/2017  . History of cesarean delivery 02/03/2017  . Marijuana use 07/26/2016  . 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 High  blood hemoglobin F (HCC) 07/26/2016  . CIN I (cervical intraepithelial neoplasia I) 04/06/2013  . Atypical squamous cell changes of undetermined significance (ASCUS) on cervical cytology with positive high risk human papilloma virus (HPV) 03/17/2013    Past Surgical History:  Procedure Laterality Date  . CESAREAN SECTION     x3  . CESAREAN SECTION WITH BILATERAL TUBAL LIGATION Bilateral 02/04/2017   pt denies having had tubal ligation; unable to delete    OB History    Gravida  5   Para  4   Term  2   Preterm  2   AB  1   Living  4     SAB      TAB  1   Ectopic    0   Multiple  0   Live Births  4            Home Medications    Prior to Admission medications   Medication Sig Start Date End Date Taking? Authorizing Provider  acetaminophen (TYLENOL) 500 MG tablet Take 1,000 mg by mouth every 6 (six) hours as needed for mild pain or headache.   Yes [provider]  carvedilol (COREG) 3.125 MG tablet Take 1 tablet (3.125 mg total) by mouth 2 (two) times daily with a meal. 06/19/17  Yes Nahser, Deloris Ping, MD  ferrous sulfate 325 (65 FE) MG tablet Take 1 tablet (325 mg total) by mouth 2 (two) times daily with a meal. 03/17/17  Yes Noralee Stain, DO  amoxicillin-clavulanate (AUGMENTIN) 875-125 MG tablet Take 1 tablet by mouth every 12 (twelve) hours. 07/11/18   Bing Neighbors, FNP  HYDROcodone-acetaminophen (NORCO/VICODIN) 5-325 MG tablet Take 1-2 tablets by mouth every 4 (four) hours as needed. 07/11/18   Bing Neighbors, FNP  losartan (COZAAR) 25 MG tablet Take 1 tablet (25 mg total) by mouth daily. 06/19/17 07/19/17  Nahser, Deloris Ping, MD    Family History Family History  Problem Relation Age of Onset  . Hypertension Mother   . Heart disease Father   . Asthma Brother     Social History Social History   Tobacco Use  . Smoking status: Current Some Day Smoker    Packs/day: 0.25    Years: 7.00    Pack years: 1.75    Types: Cigarettes  . Smokeless tobacco: Former Neurosurgeon  . Tobacco comment: 3 cigs/day, has used vape pens  Substance Use Topics  . Alcohol use: No  . Drug use: No    Comment: Subutex     Allergies   Patient has no known allergies.   Review of Systems Review of Systems Pertinent negatives listed in HPI Physical Exam Triage Vital Signs ED Triage Vitals  Enc Vitals Group     BP 07/11/18 1200 (!) 149/97     Pulse Rate 07/11/18 1159 71     Resp 07/11/18 1159 16     Temp 07/11/18 1159 98.7 F (37.1 C)     Temp Source 07/11/18 1159 Temporal     SpO2 07/11/18 1159 100 %     Weight --      Height --       Head Circumference --      Peak Flow --      Pain Score 07/11/18 1159 10     Pain Loc --      Pain Edu? --      Excl. in GC? --    No data found.  Updated Vital Signs BP (!) 149/97  Pulse 71   Temp 98.7 F (37.1 C) (Temporal)   Resp 16   LMP 06/06/2018 (Approximate)   SpO2 100%   Visual Acuity Right Eye Distance:   Left Eye Distance:   Bilateral Distance:    Right Eye Near:   Left Eye Near:    Bilateral Near:     Physical Exam General appearance: alert, well developed, well nourished, cooperative and in no distress Head: Normocephalic, without obvious abnormality, atraumatic. No facial swelling  Mouth: No abscesses or edematous gum tissue  Respiratory: Respirations even and unlabored, normal respiratory rate Extremities: No gross deformities Skin: Skin color, texture, turgor normal. No rashes seen  Psych: Appropriate mood and affect. Neurologic: Mental status: Alert, oriented to person, place, and time, thought content appropriate. UC Treatments / Results  Labs (all labs ordered are listed, but only abnormal results are displayed) Labs Reviewed  POCT PREGNANCY, URINE - Abnormal; Notable for the following components:      Result Value   Preg Test, Ur POSITIVE (*)    All other components within normal limits    EKG None  Radiology No results found.  Procedures Procedures (including critical care time)  Medications Ordered in UC Medications  HYDROcodone-acetaminophen (NORCO/VICODIN) 5-325 MG per tablet 2 tablet (2 tablets Oral Given 07/11/18 1240)    Initial Impression / Assessment and Plan / UC Course  I have reviewed the triage vital signs and the nursing notes.  Pertinent labs & imaging results that were available during my care of the patient were reviewed by me and considered in my medical decision making (see chart for details).  Clinical Course as of Jul 13 508  Wynelle Link Jul 11, 2018  1215 POCT Pregnancy, Urine [KH]    Clinical Course User  Index [KH] Bing Neighbors, FNP   Patient discontinued from amoxicillin and started on Augmentin.  Given 1 day supply of pain medication as she has had a recent prescription for pain medication.  She also advised her visit she had had a menstrual cycle since August.  Urine pregnancy was positive.  Patient was advised of this result and to follow-up with her primary care or OB/GYN especially given her cardiac history.  She was also advised to follow-up with oral surgeon on tomorrow with evaluation and management.  Patient verbalized agreement and understanding of plan.   Final Clinical Impressions(s) / UC Diagnoses   Final diagnoses:  Pain, dental     Discharge Instructions     Please contact her oral surgeon first thing in the morning to follow-up with him regarding your current level of pain and concerns regarding oral surgery.    ED Prescriptions    Medication Sig Dispense Auth. Provider   amoxicillin-clavulanate (AUGMENTIN) 875-125 MG tablet Take 1 tablet by mouth every 12 (twelve) hours. 14 tablet Bing Neighbors, FNP   HYDROcodone-acetaminophen (NORCO/VICODIN) 5-325 MG tablet Take 1-2 tablets by mouth every 4 (four) hours as needed. 10 tablet Bing Neighbors, FNP     Controlled Substance Prescriptions [place] Controlled Substance Registry consulted? Yes, I have consulted the [place] Controlled Substances Registry for this patient, and feel the risk/benefit ratio today is favorable for proceeding with this prescription for a controlled substance.   Bing Neighbors, FNP 07/13/18 [phone]

## 2018-07-11 NOTE — ED Triage Notes (Addendum)
Pt reports having 5 teeth extracted 6 days ago; has been taking amoxicillin.  Started 2-3 days ago with significant pain and facial swelling.  Denies fevers.  Unable to reach oral surgeon. Pt also believes she might be pregnant.

## 2018-07-12 NOTE — Telephone Encounter (Addendum)
   Primary Cardiologist:Mark Anne Fu, MD  Chart reviewed as part of pre-operative protocol coverage. Because of Lisa Vazquez's past medical history and time since last visit, he/she will require a follow-up visit in order to better assess preoperative cardiovascular risk.  Last OV was in 03/2017 - she was instructed to f/u in 4 weeks but has not returned since that time.  Pre-op covering staff: - Please schedule appointment and call patient to inform them. - Please contact requesting surgeon's office via preferred method (i.e, phone, fax) to inform them of need for appointment prior to surgery.  Laurann Montana, PA-C  07/12/2018, 3:21 PM

## 2018-07-12 NOTE — Telephone Encounter (Signed)
Called pt re: surgical clearance.  Pt advised that she has already had the procedure done. I called [facility], spoke with HIllary, and she confirms that they have already performed the procedure. Will close this encounter.

## 2018-08-09 ENCOUNTER — Inpatient Hospital Stay (HOSPITAL_COMMUNITY)
Admission: AD | Admit: 2018-08-09 | Discharge: 2018-08-09 | Discharge status: Against Medical Advice | Payer: Medicaid Other | Source: Ambulatory Visit | Attending: Obstetrics and Gynecology | Admitting: Obstetrics and Gynecology

## 2018-08-09 DIAGNOSIS — R109 Unspecified abdominal pain: Secondary | ICD-10-CM | POA: Insufficient documentation

## 2018-08-09 DIAGNOSIS — Z5329 Procedure and treatment not carried out because of patient's decision for other reasons: Secondary | ICD-10-CM | POA: Diagnosis not present

## 2018-08-09 NOTE — MAU Note (Signed)
Pt reports she is having pain near her belly botton off since her last baby 1.5 years ago, area is protruding more and is more painful. Also reports she had a positive preg test"several months ago" and then she had some bleeding so she is not sure if she miscarried.

## 2018-08-09 NOTE — MAU Note (Signed)
Pt states she is tired and does not feel like waiting. States she will come back tomorrow. Pt signed AMA form

## 2018-08-22 ENCOUNTER — Inpatient Hospital Stay (HOSPITAL_COMMUNITY)
Admission: AD | Admit: 2018-08-22 | Discharge: 2018-08-22 | Disposition: A | Discharge status: Home / Self Care | Payer: Medicaid Other | Source: Ambulatory Visit | Attending: Obstetrics & Gynecology | Admitting: Obstetrics & Gynecology

## 2018-08-22 ENCOUNTER — Encounter (HOSPITAL_COMMUNITY): Payer: Self-pay

## 2018-08-22 DIAGNOSIS — O26899 Other specified pregnancy related conditions, unspecified trimester: Secondary | ICD-10-CM

## 2018-08-22 DIAGNOSIS — O99331 Smoking (tobacco) complicating pregnancy, first trimester: Secondary | ICD-10-CM | POA: Insufficient documentation

## 2018-08-22 DIAGNOSIS — F17210 Nicotine dependence, cigarettes, uncomplicated: Secondary | ICD-10-CM | POA: Insufficient documentation

## 2018-08-22 DIAGNOSIS — R109 Unspecified abdominal pain: Secondary | ICD-10-CM | POA: Insufficient documentation

## 2018-08-22 DIAGNOSIS — Z3A11 11 weeks gestation of pregnancy: Secondary | ICD-10-CM | POA: Insufficient documentation

## 2018-08-22 DIAGNOSIS — O210 Mild hyperemesis gravidarum: Secondary | ICD-10-CM | POA: Diagnosis not present

## 2018-08-22 LAB — CBC
HCT: 33 % — ABNORMAL LOW (ref 36.0–46.0)
Hemoglobin: 10.7 g/dL — ABNORMAL LOW (ref 12.0–15.0)
MCH: 25.3 pg — AB (ref 26.0–34.0)
MCHC: 32.4 g/dL (ref 30.0–36.0)
MCV: 78 fL — AB (ref 80.0–100.0)
NRBC: 0 % (ref 0.0–0.2)
Platelets: 179 10*3/uL (ref 150–400)
RBC: 4.23 MIL/uL (ref 3.87–5.11)
RDW: 16.7 % — ABNORMAL HIGH (ref 11.5–15.5)
WBC: 8.5 10*3/uL (ref 4.0–10.5)

## 2018-08-22 LAB — URINALYSIS, ROUTINE W REFLEX MICROSCOPIC
BACTERIA UA: NONE SEEN
BILIRUBIN URINE: NEGATIVE
Glucose, UA: NEGATIVE mg/dL
KETONES UR: NEGATIVE mg/dL
LEUKOCYTES UA: NEGATIVE
Nitrite: NEGATIVE
PROTEIN: NEGATIVE mg/dL
Specific Gravity, Urine: 1.014 (ref 1.005–1.030)
pH: 6 (ref 5.0–8.0)

## 2018-08-22 LAB — WET PREP, GENITAL
Sperm: NONE SEEN
Trich, Wet Prep: NONE SEEN
Yeast Wet Prep HPF POC: NONE SEEN

## 2018-08-22 NOTE — MAU Note (Signed)
LMP around 9/15.  Had a positive HPT in October.  Not planning on keeping the pregnancy.  Started having lower abdominal pain 2 days ago-took tylenol at 1800 without relief.  No VB/discharge.

## 2018-08-22 NOTE — MAU Provider Note (Signed)
History     CSN: [id number]  Arrival date and time: 08/22/18 2135   First Provider Initiated Contact with Patient 08/22/18 2222      Chief Complaint  Patient presents with  . Abdominal Pain  . Possible Pregnancy   [id number] @11 .0 wks by LMP here with LAP. Pain started 2 days ago. Describes as sharp in center of lower abdomen. She took Tylenol but didn't help. Denies VB or discharge. No urinary sx. Having morning sickness some days. Planning to terminate the pregnancy soon d/t her hx of CHF.   OB History    Gravida  6   Para  4   Term  2   Preterm  2   AB  1   Living  4     SAB      TAB  1   Ectopic  0   Multiple  0   Live Births  4           Past Medical History:  Diagnosis Date  . Acute CHF (HCC) 03/15/2017  . Atypical squamous cell changes of undetermined significance (ASCUS) on cervical cytology with positive high risk human papilloma virus (HPV) 03/17/2013   12/2012 pap smear ASCUS +HPV-  03/17/2013 colpo- [ ] Needs Colpo postpartum 2018  . Bacterial vaginal infection 04/06/2013  . Bacterial vaginosis 04/06/2013  . BV (bacterial vaginosis)   . CHF (congestive heart failure) (HCC)   . Chronic hypertension during pregnancy, antepartum 07/17/2016   [x]  Aspirin 81 mg daily after 12 weeks; discontinue after 36 weeks--Rx sent Current antihypertensives:  None   Baseline and surveillance labs (pulled in from [facility], refresh links as needed)  Lab Results Component Value Date  PLT 240 07/05/2016  CREATININE 0.74 07/17/2016  AST 11 07/17/2016  ALT 5 07/17/2016  Baseline PCR 347  Antenatal Testing CHTN - O10.919  Group I  BP < 140/90, no preeclampsia, AGA,  nml AFV, +/- meds    Group II BP > 140/90, on meds, no preeclampsia, AGA, nml AFV  20-28-34-38  20-24-28-32-35-38  32//2 x wk  28//BPP wkly then 32//2 x wk  40 no meds; 39 meds  PRN or 37 Pre-eclampsia  GHTN - O13.9/Preeclampsia without severe features  - O14.00   Preeclampsia with severe features - O14.10  Q 3-4wks  Q 2 wks   28//BPP wkly then 32//2 x wk  Inpatient  37  PRN or 34    . CIN I (cervical intraepithelial neoplasia I) 04/06/2013  . Essential hypertension 03/08/2017  . Gestational diabetes    gestational  . 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   . History of cesarean delivery 02/03/2017  . History of recurrent UTIs 07/24/2016   [ ] Need UTI suppression  [ ] Ask if she took Keflex 07/06/16. Culture Positive 07/17/16. Not she if this is Tx failure or failure to take ABX.   Patient did finally take ATB- she is now on suppression  . Hypertension   . Kidney infection   . Marijuana use 07/26/2016   Pos 07/11/16  . Postpartum care and examination 03/18/2017  . 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 Systolic and diastolic CHF, acute (HCC) 03/08/2017  . UTI (lower urinary tract infection)     Past Surgical History:  Procedure Laterality Date  . CESAREAN SECTION     x3  . CESAREAN SECTION WITH BILATERAL TUBAL LIGATION Bilateral 02/04/2017   pt denies having had tubal ligation; unable to delete    Family History  Problem Relation Age of Onset  . Hypertension Mother   . Heart disease Father   . Asthma Brother     Social History   Tobacco Use  . Smoking status: Current Some Day Smoker    Packs/day: 0.25    Years: 7.00    Pack years: 1.75    Types: Cigarettes  . Smokeless tobacco: Former Neurosurgeon  . Tobacco comment: 3 cigs/day, has used vape pens  Substance Use Topics  . Alcohol use: No  . Drug use: No    Comment: Subutex    Allergies: No Known Allergies  No medications prior to admission.    Review of Systems  Constitutional: Negative for chills and fever.  Gastrointestinal: Positive for abdominal pain, nausea and vomiting. Negative for constipation and diarrhea.  Genitourinary: Negative for dysuria, frequency, hematuria, urgency, vaginal bleeding and vaginal discharge.   Physical Exam   Blood pressure 122/78, pulse 80, temperature 99.2 F  (37.3 C), resp. rate 17, height 5\' 4"  (1.626 m), weight 73.3 kg, last menstrual period 06/06/2018, not currently breastfeeding.  Physical Exam  Constitutional: She is oriented to person, place, and time. She appears well-developed and well-nourished. No distress.  HENT:  Head: Normocephalic and atraumatic.  Neck: Normal range of motion.  Cardiovascular: Normal rate.  Respiratory: Effort normal. No respiratory distress.  GI: She exhibits no distension and no mass. There is no tenderness. There is no rebound and no guarding.  3cm diastasis above umbilicus  Genitourinary:  Genitourinary Comments: Cervix closed/long  Musculoskeletal: Normal range of motion.  Neurological: She is alert and oriented to person, place, and time.  Skin: Skin is warm and dry.  Psychiatric: She has a normal mood and affect.  FHT 140  Results for orders placed or performed during the hospital encounter of 08/22/18 (from the past 24 hour(s))  Urinalysis, Routine w reflex microscopic     Status: Abnormal   Collection Time: 08/22/18  9:59 PM  Result Value Ref Range   Color, Urine YELLOW YELLOW   APPearance CLEAR CLEAR   Specific Gravity, Urine 1.014 1.005 - 1.030   pH 6.0 5.0 - 8.0   Glucose, UA NEGATIVE NEGATIVE mg/dL   Hgb urine dipstick MODERATE (A) NEGATIVE   Bilirubin Urine NEGATIVE NEGATIVE   Ketones, ur NEGATIVE NEGATIVE mg/dL   Protein, ur NEGATIVE NEGATIVE mg/dL   Nitrite NEGATIVE NEGATIVE   Leukocytes, UA NEGATIVE NEGATIVE   RBC / HPF 6-10 0 - 5 RBC/hpf   WBC, UA 0-5 0 - 5 WBC/hpf   Bacteria, UA NONE SEEN NONE SEEN   Squamous Epithelial / LPF 0-5 0 - 5   Mucus PRESENT   Wet prep, genital     Status: Abnormal   Collection Time: 08/22/18 10:28 PM  Result Value Ref Range   Yeast Wet Prep HPF POC NONE SEEN NONE SEEN   Trich, Wet Prep NONE SEEN NONE SEEN   Clue Cells Wet Prep HPF POC PRESENT (A) NONE SEEN   WBC, Wet Prep HPF POC FEW (A) NONE SEEN   Sperm NONE SEEN   CBC     Status: Abnormal    Collection Time: 08/22/18 10:56 PM  Result Value Ref Range   WBC 8.5 4.0 - 10.5 K/uL   RBC 4.23 3.87 - 5.11 MIL/uL   Hemoglobin 10.7 (L) 12.0 - 15.0 g/dL   HCT 16.1 (L) 09.6 - 04.5 %   MCV 78.0 (L) 80.0 - 100.0 fL   MCH 25.3 (L) 26.0 - 34.0 pg   MCHC  32.4 30.0 - 36.0 g/dL   RDW 86.516.7 (H) 78.411.5 - 69.615.5 %   Platelets 179 150 - 400 K/uL   nRBC 0.0 0.0 - 0.2 %   MAU Course  Procedures  MDM Labs ordered and reviewed. UA with H[facility], will send culture. Pain likely physiologic to early pregnancy. Stable for discharge home.   Assessment and Plan   1. [redacted] weeks gestation of pregnancy   2. Abdominal pain in pregnancy   3. Abdominal pain affecting pregnancy   4. Morning sickness    Discharge home Follow up with OBGYN provider of choice SAB precautions OTC antiemetics  Allergies as of 08/22/2018   No Known Allergies     Medication List    STOP taking these medications   amoxicillin-clavulanate 875-125 MG tablet Commonly known as:  AUGMENTIN   carvedilol 3.125 MG tablet Commonly known as:  COREG   ferrous sulfate 325 (65 FE) MG tablet   HYDROcodone-acetaminophen 5-325 MG tablet Commonly known as:  NORCO/VICODIN   losartan 25 MG tablet Commonly known as:  COZAAR     TAKE these medications   acetaminophen 500 MG tablet Commonly known as:  TYLENOL Take 1,000 mg by mouth every 6 (six) hours as needed for mild pain or headache.      Donette LarryMelanie Fue Cervenka, CNM 08/22/2018, 11:29 PM

## 2018-08-22 NOTE — Discharge Instructions (Signed)

## 2018-08-23 LAB — GC/CHLAMYDIA PROBE AMP (~~LOC~~) NOT AT ARMC
Chlamydia: NEGATIVE
NEISSERIA GONORRHEA: NEGATIVE

## 2018-08-24 LAB — CULTURE, OB URINE: Culture: NO GROWTH

## 2018-09-16 ENCOUNTER — Encounter: Payer: Medicaid Other | Admitting: Obstetrics and Gynecology

## 2018-09-29 NOTE — Progress Notes (Signed)
Cardiology Office Note   Date:  09/30/2018   ID:  Lisa Vazquez, DOB [birth year], MRN [id number]  PCP:  Patient, No Pcp Per  Cardiologist:  Dr. Donato SchultzMark Skains, MD    Chief Complaint  Patient presents with  . Cardiomyopathy  . Follow-up   History of Present Illness: Lisa Vazquez is a 33 y.o. female who presents for post-partum cardiomyopathy follow up.   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 She was last seen by our team 03/30/2017 after her initial hospitalization. She was initially admitted with acute systolic heart failure symptoms with a bedside ED echo revealing LVEF of 35 to 40%.  She had no PE on CTA.  An official echocardiogram performed 03/09/17 revealed an LVEF of 25% with mild LVH, diffuse hypokinesis with inferolateral akinesis, LV cavity was moderately dilated with an LVEDP around 20 mmHg.  At discharge she was placed on carvedilol 3.125 mg twice daily as well as Lasix 20 mg a day, K. Dur 10 mEq daily and losartan 25 mg daily.  During her last office visit, she had no complaints.  She was not weighing herself daily but had been watching her sodium intake.  She denied chest pain and was planning tobacco cessation.  Today she presents for cardiology follow up given her prior cardiac history with concern that she is [redacted] weeks gestation and extremely nervous given her prior experience. She reports no symptoms such as SOB, LE swelling, chest pain, or orthopnea symptoms. She states that she has been taking her Carvedilol 3.125mg  twice daily since she was last seen by our office in 03/2017. She states that she was not planning on becoming pregnant out of fear and had actually attempted to have the baby aborted, however she was too far along. She has accepted this and is ready to obtain all care possible to keep herslf and her baby safe.    Past Medical History:  Diagnosis Date  . Acute CHF (HCC) 03/15/2017  .  Atypical squamous cell changes of undetermined significance (ASCUS) on cervical cytology with positive high risk human papilloma virus (HPV) 03/17/2013   12/2012 pap smear ASCUS +HPV-  03/17/2013 colpo- [ ] Needs Colpo postpartum 2018  . Bacterial vaginal infection 04/06/2013  . Bacterial vaginosis 04/06/2013  . BV (bacterial vaginosis)   . CHF (congestive heart failure) (HCC)   . Chronic hypertension during pregnancy, antepartum 07/17/2016   [x]  Aspirin 81 mg daily after 12 weeks; discontinue after 36 weeks--Rx sent Current antihypertensives:  None   Baseline and surveillance labs [facility], refresh links as needed)  Lab Results Component Value Date  PLT 240 07/05/2016  CREATININE 0.74 07/17/2016  AST 11 07/17/2016  ALT 5 07/17/2016  Baseline PCR 347  Antenatal Testing CHTN - O10.919  Group I  BP < 140/90, no preeclampsia, AGA,  nml AFV, +/- meds    Group II BP > 140/90, on meds, no preeclampsia, AGA, nml AFV  20-28-34-38  20-24-28-32-35-38  32//2 x wk  28//BPP wkly then 32//2 x wk  40 no meds; 39 meds  PRN or 37 Pre-eclampsia  GHTN - O13.9/Preeclampsia without severe features  - O14.00   Preeclampsia with severe features - O14.10  Q 3-4wks  Q 2 wks  28//BPP wkly then 32//2 x wk  Inpatient  37  PRN or 34    . CIN I (cervical intraepithelial neoplasia I) 04/06/2013  . Essential hypertension 03/08/2017  .  Gestational diabetes    gestational  . 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   . History of cesarean delivery 02/03/2017  . History of recurrent UTIs 07/24/2016   [ ] Need UTI suppression  [ ] Ask if she took Keflex 07/06/16. Culture Positive 07/17/16. Not she if this is Tx failure or failure to take ABX.   Patient did finally take ATB- she is now on suppression  . Hypertension   . Kidney infection   . Marijuana use 07/26/2016   Pos 07/11/16  . Postpartum care and examination 03/18/2017  . 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 Systolic and  diastolic CHF, acute (HCC) 03/08/2017  . UTI (lower urinary tract infection)     Past Surgical History:  Procedure Laterality Date  . CESAREAN SECTION     x3  . CESAREAN SECTION WITH BILATERAL TUBAL LIGATION Bilateral 02/04/2017   pt denies having had tubal ligation; unable to delete     Current Outpatient Medications  Medication Sig Dispense Refill  . acetaminophen (TYLENOL) 500 MG tablet Take 1,000 mg by mouth every 6 (six) hours as needed for mild pain or headache.    . carvedilol (COREG) 3.125 MG tablet Take 3.125 mg by mouth 2 (two) times daily with a meal.     No current facility-administered medications for this visit.     Allergies:   Patient has no known allergies.    Social History:  The patient  reports that she has been smoking cigarettes. She has a 1.75 pack-year smoking history. She has quit using smokeless tobacco. She reports that she does not drink alcohol or use drugs.   Family History:  The patient's family history includes Asthma in her brother; Heart disease in her father; Hypertension in her mother.    ROS:  Please see the history of present illness. Otherwise, review of systems are positive for none.  All other systems are reviewed and negative.    PHYSICAL EXAM: VS:  BP 114/82   Pulse 82   Ht 5\' 4"  (1.626 m)   Wt 164 lb 1.9 oz (74.4 kg)   LMP 06/06/2018   SpO2 96%   BMI 28.17 kg/m  , BMI Body mass index is 28.17 kg/m.  General: Well developed, well nourished, NAD Skin: Warm, dry, intact  Head: Normocephalic, atraumatic, sclera non-icteric, no xanthomas, clear, moist mucus membranes. Neck: Negative for carotid bruits. No JVD Lungs:Clear to ausculation bilaterally. No wheezes, rales, or rhonchi. Breathing is unlabored. Cardiovascular: RRR with S1 S2. No murmurs, rubs, gallops, or LV heave appreciated. MSK: Strength and tone appear normal for age. 5/5 in all extremities Extremities: No edema. No clubbing or cyanosis. DP/PT pulses 2+  bilaterally Neuro: Alert and oriented. No focal deficits. No facial asymmetry. MAE spontaneously. Psych: Responds to questions appropriately with normal affect.     EKG:  EKG is ordered today. The ekg ordered today demonstrates NSR with non-specific T wave abnormalities, improved from prior tracings in 2018   Recent Labs: 01/18/2018: ALT 10; B Natriuretic Peptide 779.6; BUN 8; Creatinine, Ser 0.94; Potassium 4.2; Sodium 140 08/22/2018: Hemoglobin 10.7; Platelets 179    Lipid Panel    Component Value Date/Time   CHOL 154 04/06/2013 1151   TRIG 42 04/06/2013 1151   HDL 72 04/06/2013 1151   CHOLHDL 2.1 04/06/2013 1151   VLDL 8 04/06/2013 1151   LDLCALC 74 04/06/2013 1151      Wt Readings from Last 3 Encounters:  09/30/18 164 lb 1.9  oz (74.4 kg)  08/22/18 161 lb 8 oz (73.3 kg)  08/09/18 158 lb (71.7 kg)     Other studies Reviewed: Additional studies/ records that were reviewed today include:   ECHO 03/09/2017 Study Conclusions  - Left ventricle: The cavity size was moderately dilated. Wall thickness was increased in a pattern of mild LVH. Diffuse hypokinesis with inferolateral akinesis. The estimated ejection fraction was 25%. Doppler parameters are consistent with restrictive physiology, indicative of decreased left ventricular diastolic compliance and/or increased left atrial pressure. E/medial e&' >15, suggesting LV end diastolic pressure at least 20 mmHg. - Aortic valve: There was no stenosis. - Mitral valve: There was moderate regurgitation, suspect functional. - Left atrium: The atrium was moderately to severely dilated. - Right ventricle: The cavity size was normal. Systolic function was mildly reduced. - Tricuspid valve: Peak RV-RA gradient (S): 30 mm Hg. - Pulmonary arteries: PA peak pressure: 38 mm Hg (S). - Systemic veins: IVC measured 1.6 cm with < 50% respirophasic variation, suggesting RA pressure 8 mmHg.  Impressions:  -  Moderately dilated LV with mild LV hypertrophy. EF 25%, diffuse hypokinesis with inferolateral akinesis. Restrictive diastolic function with evidence for elevated LV filling pressure. Normal RV size with mildly decreased systolic function. Moderate central regurgitation, likely functional. Mild pulmonary hypertension.   ASSESSMENT AND PLAN:  1.  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 Conferred with pharmacy team for best option. I have explained that there is a risk of taking BB (risk of possible risk of intrauterine growth restriction ans neonatal bradycardia and hypoglycemia) with pregnancy however given her history, benefits will now outweigh the risk  -No ACE/ARB -Will follow in 4 months or sooner if needed -May need to reduce BB dose closer to delivery to avoid the above neonatal risks    2.  Pregnancy, 23 weeks: -Pt currently [redacted] weeks gestation with prior hx of postpartum cardiomyopathy with a last documented LVEF of 25% 02/2017 -Does not appear that she had a follow up echocardiogram and was only seen in follow up 03/2017 for post hospital follow up and has been lost since that time. -See plan above    3. HTN: -Stable, 114/82 -Was taking carvedilol 3.125mg >>>after discussion with Dr. Katrinka Blazing and pharmacy team, will change to Toprol for less fetal risk -Add Toprol XL 25mg  daily    Current medicines are reviewed at length with the patient today.  The patient does not have concerns  regarding medicines.  The following changes have been made: Change carvedilol 3.125mg  to Toprol 25mg  daily   Labs/ tests ordered today include: None  No orders of the defined types were placed in this encounter.   Disposition:   FU with Dr. Anne Fu in 4 months  Signed, Georgie Chard, NP  09/30/2018 10:41 AM    [facility] [address] [place], [place], [place]  [address] Phone: [phone]; Fax: [phone]

## 2018-09-30 ENCOUNTER — Encounter: Payer: Self-pay | Admitting: Cardiology

## 2018-09-30 ENCOUNTER — Other Ambulatory Visit (HOSPITAL_COMMUNITY)
Admission: RE | Admit: 2018-09-30 | Discharge: 2018-09-30 | Disposition: A | Discharge status: Home / Self Care | Payer: Medicaid Other | Source: Ambulatory Visit | Attending: Obstetrics and Gynecology | Admitting: Obstetrics and Gynecology

## 2018-09-30 ENCOUNTER — Other Ambulatory Visit: Payer: Self-pay

## 2018-09-30 ENCOUNTER — Ambulatory Visit (INDEPENDENT_AMBULATORY_CARE_PROVIDER_SITE_OTHER): Payer: Medicaid Other | Admitting: Cardiology

## 2018-09-30 ENCOUNTER — Encounter: Payer: Self-pay | Admitting: Certified Nurse Midwife

## 2018-09-30 ENCOUNTER — Ambulatory Visit (INDEPENDENT_AMBULATORY_CARE_PROVIDER_SITE_OTHER): Payer: Medicaid Other | Admitting: Certified Nurse Midwife

## 2018-09-30 VITALS — BP 137/85 | HR 76 | Wt 165.0 lb

## 2018-09-30 VITALS — BP 114/82 | HR 82 | Ht 64.0 in | Wt 164.1 lb

## 2018-09-30 DIAGNOSIS — O0990 Supervision of high risk pregnancy, unspecified, unspecified trimester: Secondary | ICD-10-CM | POA: Insufficient documentation

## 2018-09-30 DIAGNOSIS — I420 Dilated cardiomyopathy: Secondary | ICD-10-CM | POA: Diagnosis not present

## 2018-09-30 DIAGNOSIS — O09219 Supervision of pregnancy with history of pre-term labor, unspecified trimester: Secondary | ICD-10-CM

## 2018-09-30 DIAGNOSIS — O26899 Other specified pregnancy related conditions, unspecified trimester: Secondary | ICD-10-CM

## 2018-09-30 DIAGNOSIS — I509 Heart failure, unspecified: Secondary | ICD-10-CM

## 2018-09-30 DIAGNOSIS — Z98891 History of uterine scar from previous surgery: Secondary | ICD-10-CM | POA: Diagnosis not present

## 2018-09-30 DIAGNOSIS — N898 Other specified noninflammatory disorders of vagina: Secondary | ICD-10-CM

## 2018-09-30 DIAGNOSIS — Z3A15 15 weeks gestation of pregnancy: Secondary | ICD-10-CM

## 2018-09-30 DIAGNOSIS — O09212 Supervision of pregnancy with history of pre-term labor, second trimester: Secondary | ICD-10-CM | POA: Diagnosis not present

## 2018-09-30 DIAGNOSIS — O0992 Supervision of high risk pregnancy, unspecified, second trimester: Secondary | ICD-10-CM

## 2018-09-30 DIAGNOSIS — O26892 Other specified pregnancy related conditions, second trimester: Secondary | ICD-10-CM

## 2018-09-30 DIAGNOSIS — Z3481 Encounter for supervision of other normal pregnancy, first trimester: Secondary | ICD-10-CM

## 2018-09-30 DIAGNOSIS — O09899 Supervision of other high risk pregnancies, unspecified trimester: Secondary | ICD-10-CM

## 2018-09-30 DIAGNOSIS — O0932 Supervision of pregnancy with insufficient antenatal care, second trimester: Secondary | ICD-10-CM

## 2018-09-30 MED ORDER — METOPROLOL SUCCINATE ER 25 MG PO TB24: 25.0000 mg | ORAL_TABLET | Freq: Every day | ORAL | 3 refills | Status: DC

## 2018-09-30 MED ORDER — METRONIDAZOLE 500 MG PO TABS: 500.0000 mg | ORAL_TABLET | Freq: Two times a day (BID) | ORAL | 0 refills | Status: DC

## 2018-09-30 NOTE — Patient Instructions (Signed)
Medication Instructions:  Your physician has recommended you make the following change in your medication: 1. Discontinue Coreg 2. Start Toprol one tablet (25 mg ) daily, sent in today to requested pharmacy.    Labwork: -None  Testing/Procedures: Your physician has requested that you have an echocardiogram. Echocardiography is a painless test that uses sound waves to create images of your heart. It provides your doctor with information about the size and shape of your heart and how well your heart's chambers and valves are working. This procedure takes approximately one hour. There are no restrictions for this procedure.    Follow-Up: Your physician recommends that you keep your scheduled a follow-up appointment with Dr. Anne Fu.    Any Other Special Instructions Will Be Listed Below (If Applicable).     If you need a refill on your cardiac medications before your next appointment, please call your pharmacy.

## 2018-09-30 NOTE — Addendum Note (Signed)
Addended by: Kennon Portela on: 09/30/2018 04:06 PM   Modules accepted: Orders

## 2018-09-30 NOTE — Progress Notes (Signed)
New OB.  Hx Congestive Heart Failure.  C/o knot at 1 pm from [place] 7/10 x 3 months.  She wants to know if she can use Gum to stop smoking.  Patient had an US done at [facility] giving her a due date 01/21/2019

## 2018-09-30 NOTE — Patient Instructions (Signed)

## 2018-09-30 NOTE — Progress Notes (Signed)
Subjective:   Lisa Vazquez is a 33 y.o. [id number] at [redacted]w[redacted]d by LMP being seen today for her first obstetrical visit.  Her obstetrical history is significant for pre-eclampsia, smoker and CHF. Patient does not intend to breast feed. Pregnancy history fully reviewed.  Patient reports no complaints.  HISTORY: OB History  Gravida Para Term Preterm AB Living  6 4 2 2 1 4   SAB TAB Ectopic Multiple Live Births  0 1 0 0 4    # Outcome Date GA Lbr Len/2nd Weight Sex Delivery Anes PTL Lv  6 Current           5 Term 02/04/17 [redacted]w[redacted]d  6 lb 7.7 oz (2.94 kg) M CS-LTranv Spinal  LIV     Name: Kanady,BOY Eulogia     Apgar1: 9  Apgar5: 9  4 Term 09/19/09    M CS-LTranv Spinal  LIV  3 Preterm 04/26/04 [redacted]w[redacted]d   M CS-LTranv Spinal  LIV  2 Preterm 06/30/03 [redacted]w[redacted]d   F CS-LTranv Spinal  LIV  1 TAB             Last pap smear was done 2017 and was abnormal - LSIL with HPV  Past Medical History:  Diagnosis Date  . Acute CHF (HCC) 03/15/2017  . Atypical squamous cell changes of undetermined significance (ASCUS) on cervical cytology with positive high risk human papilloma virus (HPV) 03/17/2013   12/2012 pap smear ASCUS +HPV-  03/17/2013 colpo- [ ] Needs Colpo postpartum 2018  . Bacterial vaginal infection 04/06/2013  . Bacterial vaginosis 04/06/2013  . BV (bacterial vaginosis)   . CHF (congestive heart failure) (HCC)   . Chronic hypertension during pregnancy, antepartum 07/17/2016   [x]  Aspirin 81 mg daily after 12 weeks; discontinue after 36 weeks--Rx sent Current antihypertensives:  None   Baseline and surveillance labs (pulled in from [facility], refresh links as needed)  Lab Results Component Value Date  PLT 240 07/05/2016  CREATININE 0.74 07/17/2016  AST 11 07/17/2016  ALT 5 07/17/2016  Baseline PCR 347  Antenatal Testing CHTN - O10.919  Group I  BP < 140/90, no preeclampsia, AGA,  nml AFV, +/- meds    Group II BP > 140/90, on meds, no preeclampsia, AGA, nml AFV  20-28-34-38  20-24-28-32-35-38  32//2 x  wk  28//BPP wkly then 32//2 x wk  40 no meds; 39 meds  PRN or 37 Pre-eclampsia  GHTN - O13.9/Preeclampsia without severe features  - O14.00   Preeclampsia with severe features - O14.10  Q 3-4wks  Q 2 wks  28//BPP wkly then 32//2 x wk  Inpatient  37  PRN or 34    . CIN I (cervical intraepithelial neoplasia I) 04/06/2013  . Essential hypertension 03/08/2017  . Gestational diabetes    gestational  . 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   . History of cesarean delivery 02/03/2017  . History of recurrent UTIs 07/24/2016   [ ] Need UTI suppression  [ ] Ask if she took Keflex 07/06/16. Culture Positive 07/17/16. Not she if this is Tx failure or failure to take ABX.   Patient did finally take ATB- she is now on suppression  . Hypertension   . Kidney infection   . Marijuana use 07/26/2016   Pos 07/11/16  . Postpartum care and examination 03/18/2017  . 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 Systolic and diastolic CHF, acute (HCC) 03/08/2017  . UTI (lower urinary tract infection)    Past Surgical History:  Procedure Laterality Date  . CESAREAN SECTION     x3  . CESAREAN SECTION WITH BILATERAL TUBAL LIGATION Bilateral 02/04/2017   pt denies having had tubal ligation; unable to delete  . TOOTH EXTRACTION  06/2018   Family History  Problem Relation Age of Onset  . Hypertension Mother   . Miscarriages / [place] Mother   . Varicose Veins Mother   . Heart disease Father   . Hypertension Father   . Drug abuse Father   . Asthma Brother   . Hypertension Maternal Grandmother   . Hypertension Maternal Grandfather   . Heart disease Paternal Grandmother   . Hypertension Paternal Grandmother   . Cancer Paternal Grandmother   . Hypertension Paternal Grandfather   . Asthma Son   . ADD / ADHD Son   . Diabetes Paternal Aunt   . Heart disease Paternal Uncle    Social History   Tobacco Use  . Smoking status: Current Some Day Smoker    Packs/day: 0.25     Years: 7.00    Pack years: 1.75    Types: Cigarettes  . Smokeless tobacco: Former NeurosurgeonUser  . Tobacco comment: 3 cigs/day, has used vape pens  Substance Use Topics  . Alcohol use: No  . Drug use: No    Comment: Subutex   No Known Allergies Current Outpatient Medications on File Prior to Visit  Medication Sig Dispense Refill  . acetaminophen (TYLENOL) 500 MG tablet Take 1,000 mg by mouth every 6 (six) hours as needed for mild pain or headache.    . metoprolol succinate (TOPROL XL) 25 MG 24 hr tablet Take 1 tablet (25 mg total) by mouth daily. 90 tablet 3   No current facility-administered medications on file prior to visit.     Review of Systems Pertinent items noted in HPI and remainder of comprehensive ROS otherwise negative.  Exam   Vitals:   09/30/18 1338  BP: 137/85  Pulse: 76  Weight: 165 lb (74.8 kg)   Fetal Heart Rate (bpm): 148  Uterus:  Fundal Height: 25 cm  Pelvic Exam: Perineum: no hemorrhoids, normal perineum   Vulva: normal external genitalia, no lesions   Vagina:  normal mucosa, moderate white thin discharge with odor   Cervix: no lesions and normal, pap smear done. Closed/Thick    Adnexa: normal adnexa and no mass, fullness, tenderness   Bony Pelvis: average  System: General: well-developed, well-nourished female in no acute distress   Skin: normal coloration and turgor, no rashes   Neurologic: oriented, normal, negative, normal mood   Extremities: normal strength, tone, and muscle mass, ROM of all joints is normal   HEENT PERRLA, extraocular movement intact and sclera clear   Mouth/Teeth mucous membranes moist, pharynx normal without lesions and dental hygiene good   Neck supple and no masses   Cardiovascular: regular rate and rhythm   Respiratory:  no respiratory distress, normal breath sounds   Abdomen: soft, gravid measuring 25 weeks, 3cm diastasis noted above umbilicus    Assessment:   Pregnancy: [id number] Patient Active Problem List   Diagnosis  Date Noted  . Supervision of high risk pregnancy, antepartum 09/30/2018  . History of preterm delivery, currently pregnant 09/30/2018  . Late prenatal care affecting pregnancy in second trimester 09/30/2018  . Postpartum care and examination 03/18/2017  . Acute CHF (HCC) 03/15/2017  . 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 Systolic and diastolic CHF, acute (HCC) 03/08/2017  . Essential hypertension 03/08/2017  . CHF (congestive heart failure) (HCC)  03/08/2017  . History of cesarean delivery 02/03/2017  . Marijuana use 07/26/2016  . 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 High blood hemoglobin F (HCC) 07/26/2016  . CIN I (cervical intraepithelial neoplasia I) 04/06/2013  . Atypical squamous cell changes of undetermined significance (ASCUS) on cervical cytology with positive high risk human papilloma virus (HPV) 03/17/2013     Plan:  1. Supervision of high risk pregnancy, antepartum - Based on measurements believe patient is around 25-26 weeks  - [facility] gave her due date of 01/21/2019 which would make her 4979w2d today.  - High risk pregnancy, Dr Clearance CootsHarper recommends patient being seen in 2 weeks following cardiology echo and MFM appointment  - AFP, Serum, Open Spina Bifida - Genetic Screening - Hemoglobinopathy evaluation - Obstetric Panel, Including HIV - Enroll Patient in Babyscripts - SMN1 COPY NUMBER ANALYSIS (SMA Carrier Screen) - US MFM OB DETAIL +14 WK; Future - Cytology - PAP( [place])  2. Acute congestive heart failure, unspecified heart failure type (HCC) - Was seen by Cardiologist today, medication switched to Toprol from Carvedilol  - Echo scheduled for Thursday with close follow up dependent on results per patient  - Reports being diagnosed 2 months after delivery in 2018 and told to not get pregnant again  - Can not take aspirin or NSAIDs due to CHF - US MFM OB DETAIL  +14 WK; Future  3. History of cesarean delivery - C/S x4, needs repeat  - US MFM OB DETAIL +14 WK; Future  4. History of preterm delivery, currently pregnant - Preterm deliveries due to preeclampsia, no spontaneous delivery  - US MFM OB DETAIL +14 WK; Future  5. Vaginal discharge during pregnancy, antepartum - Reports vaginal discharge with odor that has been occurring for the past 1-2 weeks  - Cytology - PAP( [place]) - metroNIDAZOLE (FLAGYL) 500 MG tablet; Take 1 tablet (500 mg total) by mouth 2 (two) times daily.  Dispense: 14 tablet; Refill: 0   Initial labs drawn. Continue prenatal vitamins. Genetic Screening discussed, AFP and NIPS: ordered. Ultrasound discussed; fetal anatomic survey: ordered. Problem list reviewed and updated. The nature of [facility] with multiple MDs and other Advanced Practice Providers was explained to patient; also emphasized that residents, students are part of our team. Routine obstetric precautions reviewed. Return for HROB with MD .   Sharyon CableVeronica C Alanys Godino, CNM [facility], [facility]

## 2018-10-02 LAB — URINE CULTURE, OB REFLEX: Organism ID, Bacteria: NO GROWTH

## 2018-10-02 LAB — CULTURE, OB URINE

## 2018-10-06 ENCOUNTER — Encounter (HOSPITAL_COMMUNITY): Payer: Self-pay

## 2018-10-06 ENCOUNTER — Ambulatory Visit (HOSPITAL_COMMUNITY)
Admission: RE | Admit: 2018-10-06 | Discharge: 2018-10-06 | Disposition: A | Discharge status: Home / Self Care | Payer: Medicaid Other | Source: Ambulatory Visit | Attending: Certified Nurse Midwife | Admitting: Certified Nurse Midwife

## 2018-10-06 DIAGNOSIS — O09219 Supervision of pregnancy with history of pre-term labor, unspecified trimester: Secondary | ICD-10-CM | POA: Diagnosis present

## 2018-10-06 DIAGNOSIS — O09213 Supervision of pregnancy with history of pre-term labor, third trimester: Secondary | ICD-10-CM | POA: Diagnosis not present

## 2018-10-06 DIAGNOSIS — O2692 Pregnancy related conditions, unspecified, second trimester: Secondary | ICD-10-CM

## 2018-10-06 DIAGNOSIS — Z98891 History of uterine scar from previous surgery: Secondary | ICD-10-CM | POA: Insufficient documentation

## 2018-10-06 DIAGNOSIS — I509 Heart failure, unspecified: Secondary | ICD-10-CM | POA: Diagnosis present

## 2018-10-06 DIAGNOSIS — O09899 Supervision of other high risk pregnancies, unspecified trimester: Secondary | ICD-10-CM

## 2018-10-06 DIAGNOSIS — Z3A24 24 weeks gestation of pregnancy: Secondary | ICD-10-CM

## 2018-10-06 DIAGNOSIS — O99332 Smoking (tobacco) complicating pregnancy, second trimester: Secondary | ICD-10-CM

## 2018-10-06 DIAGNOSIS — O0990 Supervision of high risk pregnancy, unspecified, unspecified trimester: Secondary | ICD-10-CM | POA: Diagnosis present

## 2018-10-06 DIAGNOSIS — O09293 Supervision of pregnancy with other poor reproductive or obstetric history, third trimester: Secondary | ICD-10-CM

## 2018-10-06 DIAGNOSIS — O34219 Maternal care for unspecified type scar from previous cesarean delivery: Secondary | ICD-10-CM | POA: Diagnosis not present

## 2018-10-06 HISTORY — DX: Gestational (pregnancy-induced) hypertension without significant proteinuria, unspecified trimester: O13.9

## 2018-10-07 ENCOUNTER — Other Ambulatory Visit (HOSPITAL_COMMUNITY): Payer: Self-pay | Admitting: *Deleted

## 2018-10-07 ENCOUNTER — Other Ambulatory Visit (HOSPITAL_COMMUNITY): Payer: Medicaid Other

## 2018-10-07 DIAGNOSIS — Z362 Encounter for other antenatal screening follow-up: Secondary | ICD-10-CM

## 2018-10-07 LAB — CYTOLOGY - PAP
Bacterial vaginitis: NEGATIVE
Candida vaginitis: NEGATIVE
Chlamydia: NEGATIVE
Diagnosis: NEGATIVE
HPV 16/18/45 genotyping: POSITIVE — AB
HPV: DETECTED — AB
Neisseria Gonorrhea: NEGATIVE
Trichomonas: NEGATIVE

## 2018-10-11 LAB — AFP, SERUM, OPEN SPINA BIFIDA
AFP MoM: 2.2
AFP Value: 71.7 ng/mL
Gest. Age on Collection Date: 15.6 weeks
Maternal Age At EDD: 34.6 yr
OSBR Risk 1 IN: 1047
Test Results:: NEGATIVE
Weight: 164 [lb_av]

## 2018-10-11 LAB — OBSTETRIC PANEL, INCLUDING HIV
Antibody Screen: NEGATIVE
Basophils Absolute: 0 10*3/uL (ref 0.0–0.2)
Basos: 0 %
EOS (ABSOLUTE): 0.2 10*3/uL (ref 0.0–0.4)
Eos: 2 %
HIV Screen 4th Generation wRfx: NONREACTIVE
Hematocrit: 36.2 % (ref 34.0–46.6)
Hemoglobin: 11.4 g/dL (ref 11.1–15.9)
Hepatitis B Surface Ag: NEGATIVE
Immature Grans (Abs): 0 10*3/uL (ref 0.0–0.1)
Immature Granulocytes: 0 %
Lymphocytes Absolute: 2.9 10*3/uL (ref 0.7–3.1)
Lymphs: 29 %
MCH: 25.4 pg — ABNORMAL LOW (ref 26.6–33.0)
MCHC: 31.5 g/dL (ref 31.5–35.7)
MCV: 81 fL (ref 79–97)
Monocytes Absolute: 0.8 10*3/uL (ref 0.1–0.9)
Monocytes: 8 %
Neutrophils Absolute: 5.9 10*3/uL (ref 1.4–7.0)
Neutrophils: 61 %
Platelets: 227 10*3/uL (ref 150–450)
RBC: 4.48 x10E6/uL (ref 3.77–5.28)
RDW: 14.4 % (ref 11.7–15.4)
RPR Ser Ql: NONREACTIVE
Rh Factor: POSITIVE
Rubella Antibodies, IGG: 4.45 index (ref >0.99)
WBC: 9.9 10*3/uL (ref 3.4–10.8)

## 2018-10-11 LAB — SMN1 COPY NUMBER ANALYSIS (SMA CARRIER SCREENING)

## 2018-10-11 LAB — HEMOGLOBINOPATHY EVALUATION
HGB C: 0 %
HGB S: 0 %
HGB VARIANT: 0 %
Hemoglobin A2 Quantitation: 1.9 % (ref 1.8–3.2)
Hemoglobin F Quantitation: 21.5 % — ABNORMAL HIGH (ref 0.0–2.0)
Hgb A: 76.6 % — ABNORMAL LOW (ref 96.4–98.8)

## 2018-10-14 ENCOUNTER — Encounter: Payer: Medicaid Other | Admitting: Obstetrics & Gynecology

## 2018-10-21 ENCOUNTER — Other Ambulatory Visit (HOSPITAL_COMMUNITY): Payer: Medicaid Other

## 2018-10-26 ENCOUNTER — Other Ambulatory Visit (HOSPITAL_COMMUNITY): Payer: Medicaid Other

## 2018-10-28 ENCOUNTER — Encounter: Payer: Medicaid Other | Admitting: Obstetrics and Gynecology

## 2018-10-28 ENCOUNTER — Other Ambulatory Visit: Payer: Medicaid Other

## 2018-11-01 ENCOUNTER — Telehealth: Payer: Self-pay

## 2018-11-01 ENCOUNTER — Encounter: Payer: Self-pay | Admitting: Obstetrics and Gynecology

## 2018-11-01 NOTE — Telephone Encounter (Signed)
New message   Just an FYI.  We have made several attempts to contact this patient including sending a letter to schedule or reschedule their echocardiogram. We will be removing the patient from the echo WQ.   Patient has no show 1/16, 1/30 & 2/4    Thank you

## 2018-11-01 NOTE — Telephone Encounter (Signed)
Thank you for the information. Georgie Chard, NP  ordered this echo. You should notify her as well.

## 2018-11-02 ENCOUNTER — Encounter (HOSPITAL_COMMUNITY): Payer: Self-pay

## 2018-11-03 ENCOUNTER — Encounter (HOSPITAL_COMMUNITY): Payer: Self-pay

## 2018-11-03 ENCOUNTER — Other Ambulatory Visit (HOSPITAL_COMMUNITY): Payer: Self-pay | Admitting: *Deleted

## 2018-11-03 ENCOUNTER — Ambulatory Visit (HOSPITAL_COMMUNITY)
Admission: RE | Admit: 2018-11-03 | Discharge: 2018-11-03 | Disposition: A | Discharge status: Home / Self Care | Payer: Medicaid Other | Source: Ambulatory Visit | Attending: Certified Nurse Midwife | Admitting: Certified Nurse Midwife

## 2018-11-03 DIAGNOSIS — O10919 Unspecified pre-existing hypertension complicating pregnancy, unspecified trimester: Secondary | ICD-10-CM

## 2018-11-03 DIAGNOSIS — O09292 Supervision of pregnancy with other poor reproductive or obstetric history, second trimester: Secondary | ICD-10-CM | POA: Diagnosis not present

## 2018-11-03 DIAGNOSIS — Z3A28 28 weeks gestation of pregnancy: Secondary | ICD-10-CM

## 2018-11-03 DIAGNOSIS — O09212 Supervision of pregnancy with history of pre-term labor, second trimester: Secondary | ICD-10-CM

## 2018-11-03 DIAGNOSIS — O34219 Maternal care for unspecified type scar from previous cesarean delivery: Secondary | ICD-10-CM

## 2018-11-03 DIAGNOSIS — Z362 Encounter for other antenatal screening follow-up: Secondary | ICD-10-CM | POA: Insufficient documentation

## 2018-11-03 DIAGNOSIS — O99332 Smoking (tobacco) complicating pregnancy, second trimester: Secondary | ICD-10-CM

## 2018-11-03 DIAGNOSIS — O2692 Pregnancy related conditions, unspecified, second trimester: Secondary | ICD-10-CM

## 2018-11-05 ENCOUNTER — Other Ambulatory Visit: Payer: Self-pay | Admitting: Family Medicine

## 2018-11-08 ENCOUNTER — Telehealth: Payer: Self-pay | Admitting: *Deleted

## 2018-11-08 NOTE — Progress Notes (Signed)
Sent message to Dr Anne Fu. No contraindication for starting ASA 81mg . Avoid NSAIDs to prevent fluid overload.

## 2018-11-08 NOTE — Telephone Encounter (Signed)
Telephone call to patient to schedule her follow up prenatal appointment.  She has no showed her last two prenatal appointments.  Voice mailbox was full, but I did leave SMS notification.    Per v.o. from Dr. Adrian Blackwater : She also needs to be started on a baby aspirin (81mg ) daily  Will reach out to Pregnancy Care Coordinator for assistance.

## 2018-12-01 ENCOUNTER — Encounter (HOSPITAL_COMMUNITY): Payer: Self-pay

## 2018-12-01 ENCOUNTER — Encounter: Payer: Medicaid Other | Admitting: Obstetrics & Gynecology

## 2018-12-01 ENCOUNTER — Ambulatory Visit (HOSPITAL_COMMUNITY): Payer: Medicaid Other | Admitting: *Deleted

## 2018-12-01 ENCOUNTER — Ambulatory Visit (HOSPITAL_COMMUNITY)
Admission: RE | Admit: 2018-12-01 | Discharge: 2018-12-01 | Disposition: A | Discharge status: Home / Self Care | Payer: Medicaid Other | Source: Ambulatory Visit | Attending: Obstetrics and Gynecology | Admitting: Obstetrics and Gynecology

## 2018-12-01 ENCOUNTER — Ambulatory Visit (HOSPITAL_COMMUNITY): Admission: RE | Admit: 2018-12-01 | Payer: Medicaid Other | Source: Ambulatory Visit

## 2018-12-01 ENCOUNTER — Ambulatory Visit (HOSPITAL_COMMUNITY): Payer: Medicaid Other

## 2018-12-01 ENCOUNTER — Other Ambulatory Visit: Payer: Self-pay

## 2018-12-01 ENCOUNTER — Encounter: Payer: Medicaid Other | Admitting: Certified Nurse Midwife

## 2018-12-01 VITALS — BP 137/79 | HR 85 | Wt 184.6 lb

## 2018-12-01 DIAGNOSIS — O09293 Supervision of pregnancy with other poor reproductive or obstetric history, third trimester: Secondary | ICD-10-CM

## 2018-12-01 DIAGNOSIS — O0990 Supervision of high risk pregnancy, unspecified, unspecified trimester: Secondary | ICD-10-CM | POA: Insufficient documentation

## 2018-12-01 DIAGNOSIS — O09213 Supervision of pregnancy with history of pre-term labor, third trimester: Secondary | ICD-10-CM

## 2018-12-01 DIAGNOSIS — O99333 Smoking (tobacco) complicating pregnancy, third trimester: Secondary | ICD-10-CM | POA: Diagnosis not present

## 2018-12-01 DIAGNOSIS — Z362 Encounter for other antenatal screening follow-up: Secondary | ICD-10-CM | POA: Diagnosis not present

## 2018-12-01 DIAGNOSIS — O10919 Unspecified pre-existing hypertension complicating pregnancy, unspecified trimester: Secondary | ICD-10-CM | POA: Diagnosis present

## 2018-12-01 DIAGNOSIS — O34219 Maternal care for unspecified type scar from previous cesarean delivery: Secondary | ICD-10-CM

## 2018-12-01 DIAGNOSIS — Z3A32 32 weeks gestation of pregnancy: Secondary | ICD-10-CM

## 2018-12-01 DIAGNOSIS — O2693 Pregnancy related conditions, unspecified, third trimester: Secondary | ICD-10-CM

## 2018-12-03 ENCOUNTER — Telehealth: Payer: Self-pay | Admitting: *Deleted

## 2018-12-03 NOTE — Telephone Encounter (Signed)
I called patient left a voicemail to reschedule a missed appointment.

## 2018-12-23 ENCOUNTER — Encounter: Payer: Self-pay | Admitting: Obstetrics and Gynecology

## 2018-12-23 ENCOUNTER — Telehealth: Payer: Self-pay | Admitting: Cardiology

## 2018-12-23 ENCOUNTER — Other Ambulatory Visit: Payer: Self-pay

## 2018-12-23 ENCOUNTER — Telehealth: Payer: Self-pay

## 2018-12-23 ENCOUNTER — Ambulatory Visit (INDEPENDENT_AMBULATORY_CARE_PROVIDER_SITE_OTHER): Payer: Medicaid Other | Admitting: Obstetrics and Gynecology

## 2018-12-23 DIAGNOSIS — Z3A35 35 weeks gestation of pregnancy: Secondary | ICD-10-CM | POA: Diagnosis not present

## 2018-12-23 DIAGNOSIS — Z98891 History of uterine scar from previous surgery: Secondary | ICD-10-CM | POA: Diagnosis not present

## 2018-12-23 DIAGNOSIS — O09213 Supervision of pregnancy with history of pre-term labor, third trimester: Secondary | ICD-10-CM | POA: Diagnosis not present

## 2018-12-23 DIAGNOSIS — O903 Peripartum cardiomyopathy: Secondary | ICD-10-CM

## 2018-12-23 DIAGNOSIS — O09219 Supervision of pregnancy with history of pre-term labor, unspecified trimester: Secondary | ICD-10-CM

## 2018-12-23 DIAGNOSIS — O0990 Supervision of high risk pregnancy, unspecified, unspecified trimester: Secondary | ICD-10-CM

## 2018-12-23 DIAGNOSIS — O09899 Supervision of other high risk pregnancies, unspecified trimester: Secondary | ICD-10-CM

## 2018-12-23 DIAGNOSIS — Z3009 Encounter for other general counseling and advice on contraception: Secondary | ICD-10-CM | POA: Insufficient documentation

## 2018-12-23 NOTE — Telephone Encounter (Signed)
° ° °  Dr Leroy Libman is requesting asap appt for patient prior to her delivery on 4/28.  Please advise if patient can/should be scheduled for in office visit by MD or APP.

## 2018-12-23 NOTE — Progress Notes (Signed)
TELEHEALTH VIRTUAL OBSTETRICS VISIT ENCOUNTER NOTE  I connected with Lisa Vazquez on 12/23/18 at  3:30 PM EDT by telephone at home and verified that I am speaking with the correct person using two identifiers.   I discussed the limitations, risks, security and privacy concerns of performing an evaluation and management service by telephone and the availability of in person appointments. I also discussed with the patient that there may be a patient responsible charge related to this service. The patient expressed understanding and agreed to proceed.  Subjective:  Lisa Vazquez is a 33 y.o. [phone] at [redacted]w[redacted]d being followed for ongoing prenatal care.  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  Patient reports no complaints. Reports fetal movement. Denies any contractions, bleeding or leaking of fluid. Reports her hips are quite painful. Denies shortness of breath, headache, issues with vision.   The following portions of the patient's history were reviewed and updated as appropriate: allergies, current medications, past family history, past medical history, past social history, past surgical history and problem list.   Objective:   General:  Alert, oriented and  cooperative.   Mental Status: Normal mood and affect perceived. Normal judgment and thought content.  Rest of physical exam deferred due to type of encounter  Assessment and Plan:  Pregnancy: [id number] at [redacted]w[redacted]d  1. Supervision of high risk pregnancy, antepartum - Has not been seen since 1/20 - needs 3rd trim labs, 36 week swabs - will do at in-person visit next week  2. History of cesarean delivery Has RCS scheduled for 01/18/19  3. Postpartum dilated cardiomyopathy - Saw Cardiology 09/30/18 - Switched her from carvedilol to toprolol, which she states she is taking - Was scheduled for echo but missed the appt x3 - Currently asymptomatic - Reviewed importance of presenting for maternal echo, reviewed that with no idea about current heart function, RCS will be very dangerous, that this condition can be life-threatening and echo will help determine management - patient verbalizes understanding of the above and the importance of coming for echo  4. History of preterm delivery, currently pregnant  5. Unwanted fertility For BTL, affirms again today that she would like this done   Preterm labor symptoms and general obstetric precautions including but not limited to vaginal bleeding, contractions, leaking of fluid and fetal movement were reviewed in detail with the patient.  I discussed the assessment and treatment plan with the patient. The patient was provided an opportunity to ask questions and all were answered. The patient agreed with the plan and demonstrated an understanding of the instructions. The patient was advised to call back or seek an in-person office evaluation/go to [facility] at [facility] for any urgent or concerning symptoms. Please refer to After Visit Summary for other counseling recommendations.   I provided 16 minutes of non-face-to-face time during this encounter.  Return in about 1 week (around 12/30/2018) for 3rd trim labs.  Future Appointments  Date Time  Provider Department Center  02/02/2019  9:00 AM Jake Bathe, MD [facility]    Conan Bowens, MD Center for [facility], [facility]

## 2018-12-23 NOTE — Telephone Encounter (Signed)
The patient was contacted and left a message stating that she needs to call the office to schedule an appointment.

## 2018-12-23 NOTE — Progress Notes (Signed)
Pt states increase in pelvic pressure. Pt states severe hip pain.

## 2018-12-24 NOTE — Telephone Encounter (Signed)
Called to speak with patient about consent and scheduling an appointment with Dr Anne Fu.  NA - left message to call the office Monday AM and ask for me.

## 2018-12-24 NOTE — Telephone Encounter (Signed)
I will do a visit by [employer] APP on my phone (I assume she has a smart phone). She does not need to download any APP.   If you call her and consent her then sched her for Monday morning, I'll see her.   Thanks  [employer]

## 2018-12-24 NOTE — Telephone Encounter (Signed)
MS-No MyChart at this time.

## 2018-12-28 NOTE — Telephone Encounter (Signed)
Left another message for pt to c/b to reschedule her appt prior to her delivery date as requested.

## 2018-12-29 ENCOUNTER — Ambulatory Visit (INDEPENDENT_AMBULATORY_CARE_PROVIDER_SITE_OTHER): Payer: Medicaid Other | Admitting: Obstetrics and Gynecology

## 2018-12-29 ENCOUNTER — Encounter: Payer: Self-pay | Admitting: Obstetrics and Gynecology

## 2018-12-29 ENCOUNTER — Other Ambulatory Visit (HOSPITAL_COMMUNITY)
Admission: RE | Admit: 2018-12-29 | Discharge: 2018-12-29 | Disposition: A | Discharge status: Home / Self Care | Payer: Medicaid Other | Source: Ambulatory Visit | Attending: Obstetrics and Gynecology | Admitting: Obstetrics and Gynecology

## 2018-12-29 ENCOUNTER — Other Ambulatory Visit: Payer: Medicaid Other

## 2018-12-29 ENCOUNTER — Other Ambulatory Visit: Payer: Self-pay

## 2018-12-29 VITALS — BP 124/85 | HR 86 | Wt 182.0 lb

## 2018-12-29 DIAGNOSIS — O903 Peripartum cardiomyopathy: Secondary | ICD-10-CM

## 2018-12-29 DIAGNOSIS — Z98891 History of uterine scar from previous surgery: Secondary | ICD-10-CM | POA: Diagnosis not present

## 2018-12-29 DIAGNOSIS — Z3009 Encounter for other general counseling and advice on contraception: Secondary | ICD-10-CM

## 2018-12-29 DIAGNOSIS — O0990 Supervision of high risk pregnancy, unspecified, unspecified trimester: Secondary | ICD-10-CM | POA: Insufficient documentation

## 2018-12-29 DIAGNOSIS — Z3A36 36 weeks gestation of pregnancy: Secondary | ICD-10-CM

## 2018-12-29 NOTE — Progress Notes (Signed)
Subjective:  Lisa Vazquez is a 33 y.o. [phone] at [redacted]w[redacted]d being seen today for ongoing prenatal care.  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  Patient reports general discomforts of pregnancy.  Contractions: Irregular. Vag. Bleeding: None.  Movement: Present. Denies leaking of fluid.   The following portions of the patient's history were reviewed and updated as appropriate: allergies, current medications, past family history, past medical history, past social history, past surgical history and problem list. Problem list updated.  Objective:   Vitals:   12/29/18 0930  BP: 124/85  Pulse: 86  Weight: 182 lb (82.6 kg)    Fetal Status:     Movement: Present     General:  Alert, oriented and cooperative. Patient is in no acute distress.  Skin: Skin is warm and dry. No rash noted.   Cardiovascular: Normal heart rate noted  Respiratory: Normal respiratory effort, no problems with respiration noted  Abdomen: Soft, gravid, appropriate for gestational age. Pain/Pressure: Present     Pelvic:  Cervical exam deferred        Extremities: Normal range of motion.     Mental Status: Normal mood and affect. Normal behavior. Normal judgment and thought content.   Urinalysis:       Assessment and Plan:  Pregnancy: [id number] at [redacted]w[redacted]d  1. Supervision of high risk pregnancy, antepartum Stable Labs and vaginal cultures as noted - RPR - HIV Antibody (routine testing w rflx) - CBC - Glucose tolerance, 1 hour - Strep Gp B NAA - Cervicovaginal ancillary only( ) - Babyscripts Schedule Optimization  2. History of cesarean delivery Scheduled for repeat with BTL  3. Postpartum dilated cardiomyopathy Stable Importance of obtain ECHO reviewed with pt Contact # to schedule ECHO provided to pt  4. Unwanted fertility Papers signed  Term labor symptoms and general obstetric precautions including but not limited to vaginal bleeding, contractions, leaking of fluid and fetal movement were reviewed in detail with the patient. Please refer to After Visit Summary for other counseling recommendations.  Return in about 1 week (around 01/05/2019) for OB visit face to face.   Hermina Staggers, MD

## 2018-12-29 NOTE — Patient Instructions (Signed)
Third Trimester of Pregnancy The third trimester is from week 28 through week 40 (months 7 through 9). The third trimester is a time when the unborn baby (fetus) is growing rapidly. At the end of the ninth month, the fetus is about 20 inches in length and weighs 6-10 pounds. Body changes during your third trimester Your body will continue to go through many changes during pregnancy. The changes vary from woman to woman. During the third trimester:  Your weight will continue to increase. You can expect to gain 25-35 pounds (11-16 kg) by the end of the pregnancy.  You may begin to get stretch marks on your hips, abdomen, and breasts.  You may urinate more often because the fetus is moving lower into your pelvis and pressing on your bladder.  You may develop or continue to have heartburn. This is caused by increased hormones that slow down muscles in the digestive tract.  You may develop or continue to have constipation because increased hormones slow digestion and cause the muscles that push waste through your intestines to relax.  You may develop hemorrhoids. These are swollen veins (varicose veins) in the rectum that can itch or be painful.  You may develop swollen, bulging veins (varicose veins) in your legs.  You may have increased body aches in the pelvis, back, or thighs. This is due to weight gain and increased hormones that are relaxing your joints.  You may have changes in your hair. These can include thickening of your hair, rapid growth, and changes in texture. Some women also have hair loss during or after pregnancy, or hair that feels dry or thin. Your hair will most likely return to normal after your baby is born.  Your breasts will continue to grow and they will continue to become tender. A yellow fluid (colostrum) may leak from your breasts. This is the first milk you are producing for your baby.  Your belly button may stick out.  You may notice more swelling in your hands,  face, or ankles.  You may have increased tingling or numbness in your hands, arms, and legs. The skin on your belly may also feel numb.  You may feel short of breath because of your expanding uterus.  You may have more problems sleeping. This can be caused by the size of your belly, increased need to urinate, and an increase in your body's metabolism.  You may notice the fetus "dropping," or moving lower in your abdomen (lightening).  You may have increased vaginal discharge.  You may notice your joints feel loose and you may have pain around your pelvic bone. What to expect at prenatal visits You will have prenatal exams every 2 weeks until week 36. Then you will have weekly prenatal exams. During a routine prenatal visit:  You will be weighed to make sure you and the baby are growing normally.  Your blood pressure will be taken.  Your abdomen will be measured to track your baby's growth.  The fetal heartbeat will be listened to.  Any test results from the previous visit will be discussed.  You may have a cervical check near your due date to see if your cervix has softened or thinned (effaced).  You will be tested for Group B streptococcus. This happens between 35 and 37 weeks. Your health care provider may ask you:  What your birth plan is.  How you are feeling.  If you are feeling the baby move.  If you have had any abnormal   symptoms, such as leaking fluid, bleeding, severe headaches, or abdominal cramping.  If you are using any tobacco products, including cigarettes, chewing tobacco, and electronic cigarettes.  If you have any questions. Other tests or screenings that may be performed during your third trimester include:  Blood tests that check for low iron levels (anemia).  Fetal testing to check the health, activity level, and growth of the fetus. Testing is done if you have certain medical conditions or if there are problems during the pregnancy.  Nonstress test  (NST). This test checks the health of your baby to make sure there are no signs of problems, such as the baby not getting enough oxygen. During this test, a belt is placed around your belly. The baby is made to move, and its heart rate is monitored during movement. What is false labor? False labor is a condition in which you feel small, irregular tightenings of the muscles in the womb (contractions) that usually go away with rest, changing position, or drinking water. These are called Braxton Hicks contractions. Contractions may last for hours, days, or even weeks before true labor sets in. If contractions come at regular intervals, become more frequent, increase in intensity, or become painful, you should see your health care provider. What are the signs of labor?  Abdominal cramps.  Regular contractions that start at 10 minutes apart and become stronger and more frequent with time.  Contractions that start on the top of the uterus and spread down to the lower abdomen and back.  Increased pelvic pressure and dull back pain.  A watery or bloody mucus discharge that comes from the vagina.  Leaking of amniotic fluid. This is also known as your "water breaking." It could be a slow trickle or a gush. Let your health care provider know if it has a color or strange odor. If you have any of these signs, call your health care provider right away, even if it is before your due date. Follow these instructions at home: Medicines  Follow your health care provider's instructions regarding medicine use. Specific medicines may be either safe or unsafe to take during pregnancy.  Take a prenatal vitamin that contains at least 600 micrograms (mcg) of folic acid.  If you develop constipation, try taking a stool softener if your health care provider approves. Eating and drinking   Eat a balanced diet that includes fresh fruits and vegetables, whole grains, good sources of protein such as meat, eggs, or tofu,  and low-fat dairy. Your health care provider will help you determine the amount of weight gain that is right for you.  Avoid raw meat and uncooked cheese. These carry germs that can cause birth defects in the baby.  If you have low calcium intake from food, talk to your health care provider about whether you should take a daily calcium supplement.  Eat four or five small meals rather than three large meals a day.  Limit foods that are high in fat and processed sugars, such as fried and sweet foods.  To prevent constipation: ? Drink enough fluid to keep your urine clear or pale yellow. ? Eat foods that are high in fiber, such as fresh fruits and vegetables, whole grains, and beans. Activity  Exercise only as directed by your health care provider. Most women can continue their usual exercise routine during pregnancy. Try to exercise for 30 minutes at least 5 days a week. Stop exercising if you experience uterine contractions.  Avoid heavy lifting.  Do   not exercise in extreme heat or humidity, or at high altitudes.  Wear low-heel, comfortable shoes.  Practice good posture.  You may continue to have sex unless your health care provider tells you otherwise. Relieving pain and discomfort  Take frequent breaks and rest with your legs elevated if you have leg cramps or low back pain.  Take warm sitz baths to soothe any pain or discomfort caused by hemorrhoids. Use hemorrhoid cream if your health care provider approves.  Wear a good support bra to prevent discomfort from breast tenderness.  If you develop varicose veins: ? Wear support pantyhose or compression stockings as told by your healthcare provider. ? Elevate your feet for 15 minutes, 3-4 times a day. Prenatal care  Write down your questions. Take them to your prenatal visits.  Keep all your prenatal visits as told by your health care provider. This is important. Safety  Wear your seat belt at all times when driving.  Make  a list of emergency phone numbers, including numbers for family, friends, the hospital, and police and fire departments. General instructions  Avoid cat litter boxes and soil used by cats. These carry germs that can cause birth defects in the baby. If you have a cat, ask someone to clean the litter box for you.  Do not travel far distances unless it is absolutely necessary and only with the approval of your health care provider.  Do not use hot tubs, steam rooms, or saunas.  Do not drink alcohol.  Do not use any products that contain nicotine or tobacco, such as cigarettes and e-cigarettes. If you need help quitting, ask your health care provider.  Do not use any medicinal herbs or unprescribed drugs. These chemicals affect the formation and growth of the baby.  Do not douche or use tampons or scented sanitary pads.  Do not cross your legs for long periods of time.  To prepare for the arrival of your baby: ? Take prenatal classes to understand, practice, and ask questions about labor and delivery. ? Make a trial run to the hospital. ? Visit the hospital and tour the maternity area. ? Arrange for maternity or paternity leave through employers. ? Arrange for family and friends to take care of pets while you are in the hospital. ? Purchase a rear-facing car seat and make sure you know how to install it in your car. ? Pack your hospital bag. ? Prepare the baby's nursery. Make sure to remove all pillows and stuffed animals from the baby's crib to prevent suffocation.  Visit your dentist if you have not gone during your pregnancy. Use a soft toothbrush to brush your teeth and be gentle when you floss. Contact a health care provider if:  You are unsure if you are in labor or if your water has broken.  You become dizzy.  You have mild pelvic cramps, pelvic pressure, or nagging pain in your abdominal area.  You have lower back pain.  You have persistent nausea, vomiting, or  diarrhea.  You have an unusual or bad smelling vaginal discharge.  You have pain when you urinate. Get help right away if:  Your water breaks before 37 weeks.  You have regular contractions less than 5 minutes apart before 37 weeks.  You have a fever.  You are leaking fluid from your vagina.  You have spotting or bleeding from your vagina.  You have severe abdominal pain or cramping.  You have rapid weight loss or weight gain.  You have   shortness of breath with chest pain.  You notice sudden or extreme swelling of your face, hands, ankles, feet, or legs.  Your baby makes fewer than 10 movements in 2 hours.  You have severe headaches that do not go away when you take medicine.  You have vision changes. Summary  The third trimester is from week 28 through week 40, months 7 through 9. The third trimester is a time when the unborn baby (fetus) is growing rapidly.  During the third trimester, your discomfort may increase as you and your baby continue to gain weight. You may have abdominal, leg, and back pain, sleeping problems, and an increased need to urinate.  During the third trimester your breasts will keep growing and they will continue to become tender. A yellow fluid (colostrum) may leak from your breasts. This is the first milk you are producing for your baby.  False labor is a condition in which you feel small, irregular tightenings of the muscles in the womb (contractions) that eventually go away. These are called Braxton Hicks contractions. Contractions may last for hours, days, or even weeks before true labor sets in.  Signs of labor can include: abdominal cramps; regular contractions that start at 10 minutes apart and become stronger and more frequent with time; watery or bloody mucus discharge that comes from the vagina; increased pelvic pressure and dull back pain; and leaking of amniotic fluid. This information is not intended to replace advice given to you by your  health care provider. Make sure you discuss any questions you have with your health care provider. Document Released: 09/02/2001 Document Revised: 10/14/2016 Document Reviewed: 10/14/2016 Elsevier Interactive Patient Education  2019 [employer]  

## 2018-12-30 ENCOUNTER — Encounter: Payer: Medicaid Other | Admitting: Obstetrics & Gynecology

## 2018-12-30 ENCOUNTER — Other Ambulatory Visit: Payer: Medicaid Other

## 2018-12-30 LAB — CBC
Hematocrit: 34.1 % (ref 34.0–46.6)
Hemoglobin: 10.7 g/dL — ABNORMAL LOW (ref 11.1–15.9)
MCH: 24.7 pg — ABNORMAL LOW (ref 26.6–33.0)
MCHC: 31.4 g/dL — ABNORMAL LOW (ref 31.5–35.7)
MCV: 79 fL (ref 79–97)
Platelets: 191 10*3/uL (ref 150–450)
RBC: 4.33 x10E6/uL (ref 3.77–5.28)
RDW: 14.5 % (ref 11.7–15.4)
WBC: 7.8 10*3/uL (ref 3.4–10.8)

## 2018-12-30 LAB — CERVICOVAGINAL ANCILLARY ONLY
Chlamydia: NEGATIVE
Neisseria Gonorrhea: NEGATIVE

## 2018-12-30 LAB — HIV ANTIBODY (ROUTINE TESTING W REFLEX): HIV Screen 4th Generation wRfx: NONREACTIVE

## 2018-12-30 LAB — RPR: RPR Ser Ql: NONREACTIVE

## 2018-12-30 LAB — GLUCOSE TOLERANCE, 1 HOUR: Glucose, 1Hr PP: 83 mg/dL (ref 65–199)

## 2018-12-30 NOTE — Telephone Encounter (Signed)
Follow up    Patient is returning call to see about schedule her an appt prior to 04/28. She also states that her obgyn wanted her to have a echo. Please call to discuss.

## 2018-12-30 NOTE — Telephone Encounter (Signed)
Called and made patient an appointment on 4/16 with Dr. Anne Fu. Patient stated her OBGYN needs her to have an echo before she is due on 4/28. Will send message to Dr. Anne Fu to see if this can be done prior to her OV.   Virtual Visit Pre-Appointment Phone Call  Steps For Call:  1. Confirm consent - "In the setting of the current Covid19 crisis, you are scheduled for a (phone or video) visit with your provider on (date) at (time).  Just as we do with many in-office visits, in order for you to participate in this visit, we must obtain consent.  If you'd like, I can send this to your mychart (if signed up) or email for you to review.  Otherwise, I can obtain your verbal consent now.  All virtual visits are billed to your insurance company just like a normal visit would be.  By agreeing to a virtual visit, we'd like you to understand that the technology does not allow for your provider to perform an examination, and thus may limit your provider's ability to fully assess your condition.  Finally, though the technology is pretty good, we cannot assure that it will always work on either your or our end, and in the setting of a video visit, we may have to convert it to a phone-only visit.  In either situation, we cannot ensure that we have a secure connection.  Are you willing to proceed?"  2. Give patient instructions for WebEx download to smartphone as below if video visit  3. Advise patient to be prepared with any vital sign or heart rhythm information, their current medicines, and a piece of paper and pen handy for any instructions they may receive the day of their visit  4. Inform patient they will receive a phone call 15 minutes prior to their appointment time (may be from unknown caller ID) so they should be prepared to answer  5. Confirm that appointment type is correct in Epic appointment notes (video vs telephone)    TELEPHONE CALL NOTE  Lisa Vazquez has been deemed a candidate for  a follow-up tele-health visit to limit community exposure during the Covid-19 pandemic. I spoke with the patient via phone to ensure availability of phone/video source, confirm preferred email & phone number, and discuss instructions and expectations.  I reminded Lisa Vazquez to be prepared with any vital sign and/or heart rhythm information that could potentially be obtained via home monitoring, at the time of her visit. I reminded Lisa Vazquez to expect a phone call at the time of her visit if her visit.  Did the patient verbally acknowledge consent to treatment? Yes  Ethelda Chick, RN 12/30/2018 4:19 PM   DOWNLOADING THE WEBEX SOFTWARE TO SMARTPHONE  - If Apple, go to [employer] and type in WebEx in the search bar. Download [employer], the blue/green circle. The app is free but as with any other app downloads, their phone may require them to verify saved payment information or Apple password. The patient does NOT have to create an account.  - If Android, ask patient to go to [employer] and type in WebEx in the search bar. Download [employer], the blue/green circle. The app is free but as with any other app downloads, their phone may require them to verify saved payment information or Android password. The patient does NOT have to create an account.   CONSENT FOR TELE-HEALTH VISIT - PLEASE REVIEW  I hereby voluntarily request, consent and authorize [facility] and its employed or contracted physicians, physician assistants, nurse practitioners or other licensed health care professionals (the Practitioner), to provide me with telemedicine health care services (the "Services") as deemed necessary by the treating Practitioner. I acknowledge and consent to receive the Services by the Practitioner via telemedicine. I understand that the telemedicine visit will involve communicating with the Practitioner through live audiovisual communication technology  and the disclosure of certain medical information by electronic transmission. I acknowledge that I have been given the opportunity to request an in-person assessment or other available alternative prior to the telemedicine visit and am voluntarily participating in the telemedicine visit.  I understand that I have the right to withhold or withdraw my consent to the use of telemedicine in the course of my care at any time, without affecting my right to future care or treatment, and that the Practitioner or I may terminate the telemedicine visit at any time. I understand that I have the right to inspect all information obtained and/or recorded in the course of the telemedicine visit and may receive copies of available information for a reasonable fee.  I understand that some of the potential risks of receiving the Services via telemedicine include:  Marland Kitchen. Delay or interruption in medical evaluation due to technological equipment failure or disruption; . Information transmitted may not be sufficient (e.g. poor resolution of images) to allow for appropriate medical decision making by the Practitioner; and/or  . In rare instances, security protocols could fail, causing a breach of personal health information.  Furthermore, I acknowledge that it is my responsibility to provide information about my medical history, conditions and care that is complete and accurate to the best of my ability. I acknowledge that Practitioner's advice, recommendations, and/or decision may be based on factors not within their control, such as incomplete or inaccurate data provided by me or distortions of diagnostic images or specimens that may result from electronic transmissions. I understand that the practice of medicine is not an exact science and that Practitioner makes no warranties or guarantees regarding treatment outcomes. I acknowledge that I will receive a copy of this consent concurrently upon execution via email to the email  address I last provided but may also request a printed copy by calling the office of [facility].    I understand that my insurance will be billed for this visit.   I have read or had this consent read to me. . I understand the contents of this consent, which adequately explains the benefits and risks of the Services being provided via telemedicine.  . I have been provided ample opportunity to ask questions regarding this consent and the Services and have had my questions answered to my satisfaction. . I give my informed consent for the services to be provided through the use of telemedicine in my medical care  By participating in this telemedicine visit I agree to the above.

## 2018-12-31 LAB — STREP GP B NAA: Strep Gp B NAA: NEGATIVE

## 2019-01-03 ENCOUNTER — Encounter (HOSPITAL_COMMUNITY): Payer: Self-pay

## 2019-01-03 NOTE — Patient Instructions (Addendum)
Lisa Vazquez  01/03/2019   Your procedure is scheduled on:  01/18/2019  Arrive at 0730 at Entrance C on [address] at [facility]  and [address]. You are invited to use the FREE valet parking or use the Visitor's parking deck.  Pick up the phone at the desk and dial [phone].  Call this number if you have problems the morning of surgery: [phone]  Remember:   Do not eat food:(After Midnight) Desps de medianoche.  Do not drink clear liquids: (After Midnight) Desps de medianoche.  Take these medicines the morning of surgery with A SIP OF WATER:  Toprol as prescribed   Do not wear jewelry, make-up or nail polish.  Do not wear lotions, powders, or perfumes. Do not wear deodorant.  Do not shave 48 hours prior to surgery.  Do not bring valuables to the hospital.  [facility] is not   responsible for any belongings or valuables brought to the hospital.  Contacts, dentures or bridgework may not be worn into surgery.  Leave suitcase in the car. After surgery it may be brought to your room.  For patients admitted to the hospital, checkout time is 11:00 AM the day of              discharge.      Please read over the following fact sheets that you were given:     Preparing for Surgery

## 2019-01-05 ENCOUNTER — Telehealth: Payer: Self-pay | Admitting: Cardiology

## 2019-01-05 ENCOUNTER — Telehealth: Payer: Self-pay | Admitting: Obstetrics and Gynecology

## 2019-01-05 ENCOUNTER — Encounter: Payer: Medicaid Other | Admitting: Obstetrics and Gynecology

## 2019-01-05 NOTE — Telephone Encounter (Signed)
Mrs. pfahl is Mychart activated and ready for tomorrows visit

## 2019-01-05 NOTE — Telephone Encounter (Signed)
Pt is scheduled for virtual appt with Dr Anne Fu 4/16 at 8 am.  Echo to be addressed at that time.

## 2019-01-05 NOTE — Telephone Encounter (Signed)
Called patient in regards to no show for OB appointment and to encourage her to present to cardiology appt tomorrow, no answer, unable to leave VM.     Baldemar Lenis, M.D. Attending Center for [employer] [facility])

## 2019-01-06 ENCOUNTER — Telehealth: Payer: Medicaid Other | Admitting: Cardiology

## 2019-01-10 ENCOUNTER — Telehealth: Payer: Self-pay | Admitting: Obstetrics and Gynecology

## 2019-01-10 NOTE — Telephone Encounter (Signed)
Called patient to remind her of visits tomorrow with Cardiology and OB, reviewed importance of keeping visit with Cardiology in order to assess her heart function, she verbalizes understanding and states she will attend both visits (cardiology is telehealth, OB is in person). Answered all questions.   Baldemar Lenis, M.D. Attending Center for [employer] [facility])

## 2019-01-11 ENCOUNTER — Other Ambulatory Visit: Payer: Self-pay

## 2019-01-11 ENCOUNTER — Telehealth (INDEPENDENT_AMBULATORY_CARE_PROVIDER_SITE_OTHER): Payer: Medicaid Other | Admitting: Cardiology

## 2019-01-11 ENCOUNTER — Encounter: Payer: Medicaid Other | Admitting: Obstetrics and Gynecology

## 2019-01-11 ENCOUNTER — Encounter: Payer: Self-pay | Admitting: Cardiology

## 2019-01-11 VITALS — Ht 64.0 in | Wt 182.0 lb

## 2019-01-11 DIAGNOSIS — O903 Peripartum cardiomyopathy: Secondary | ICD-10-CM | POA: Diagnosis not present

## 2019-01-11 DIAGNOSIS — Z3A38 38 weeks gestation of pregnancy: Secondary | ICD-10-CM

## 2019-01-11 DIAGNOSIS — I420 Dilated cardiomyopathy: Secondary | ICD-10-CM

## 2019-01-11 NOTE — Progress Notes (Signed)
Virtual Visit via Video Note   This visit type was conducted due to national recommendations for restrictions regarding the COVID-19 Pandemic (e.g. social distancing) in an effort to limit this patient's exposure and mitigate transmission in our community.  Due to her co-morbid illnesses, this patient is at least at moderate risk for complications without adequate follow up.  This format is felt to be most appropriate for this patient at this time.  All issues noted in this document were discussed and addressed.  A limited physical exam was performed with this format.  Please refer to the patient's chart for her consent to telehealth for [facility].   Evaluation Performed:  Follow-up visit  Date:  01/11/2019   ID:  Lisa, Vazquez 07/09/1986, MRN [id number]  Patient Location: Home Provider Location: Home  PCP:  Patient, No Pcp Per  Cardiologist:  Donato Schultz, MD  Electrophysiologist:  None   Chief Complaint: Peripartum cardiomyopathy follow-up  History of Present Illness:    Lisa Vazquez is a 33 y.o. 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  Originally she was admitted on 03/08/2017 for heart failure 1 month postpartum with increasing shortness of breath and pressure in her chest.  CT scan of the chest was negative for PE but severe cardiomegaly was noted.  BNP was elevated at the time and T wave inversions were noted in the lateral leads.  Troponin was minimally elevated at 0.09.  She was last seen in our office by Lisa Chard, NP on 09/30/2018 at 23 weeks and she was nervous about her prior experience.  At the time no symptoms no shortness of breath no lower extremity swelling no chest pain.  She had been taking her carvedilol 3.125 mg twice a day.  This was changed to Toprol-XL 25 mg a day.  Risk of intrauterine growth restriction as well as neonatal bradycardia and hypoglycemia were  previously discussed.  However given her cardiomyopathy, risks outweigh benefits.  Of course, she is not on an ACE inhibitor or angiotensin receptor blocker.  The patient does not have symptoms concerning for COVID-19 infection (fever, chills, cough, or new shortness of breath).    Past Medical History:  Diagnosis Date   Acute CHF (HCC) 03/15/2017   Atypical squamous cell changes of undetermined significance (ASCUS) on cervical cytology with positive high risk human papilloma virus (HPV) 03/17/2013   12/2012 pap smear ASCUS +HPV-  03/17/2013 colpo- Needs Colpo postpartum 2018   Bacterial vaginal infection 04/06/2013   Bacterial vaginosis 04/06/2013   BV (bacterial vaginosis)    CHF (congestive heart failure) (HCC)    Chronic hypertension during pregnancy, antepartum 07/17/2016    Aspirin 81 mg daily after 12 weeks; discontinue after 36 weeks--Rx sent Current antihypertensives:  None   Baseline and surveillance labs (pulled in from [facility], refresh links as needed)  Lab Results Component Value Date  PLT 240 07/05/2016  CREATININE 0.74 07/17/2016  AST 11 07/17/2016  ALT 5 07/17/2016  Baseline PCR 347  Antenatal Testing [facility]HT[facility] - O10.919  Group I  BP < 140/90, no preeclampsia, AGA,  nml AFV, +/- meds    Group II BP > 140/90, on meds, no preeclampsia, AGA, nml AFV  20-28-34-38  20-24-28-32-35-38  32//2 x wk  28//BPP wkly then 32//2 x wk  40 no meds; 39 meds  PRN or 37 Pre-eclampsia  GHTN - O13.9/Preeclampsia without severe features  - O14.00  Preeclampsia with severe features - O14.10  Q 3-4wks  Q 2 wks  28//BPP wkly then 32//2 x wk  Inpatient  37  PRN or 34     CIN I (cervical intraepithelial neoplasia I) 04/06/2013   Essential hypertension 03/08/2017   Gestational diabetes    gestational   High blood hemoglobin F (HCC) 07/26/2016   Not usually clinically significant.    History of cesarean delivery 02/03/2017   History of recurrent UTIs 07/24/2016   [ ] Need UTI suppression  [ ] Ask if she  took Keflex 07/06/16. Culture Positive 07/17/16. Not she if this is Tx failure or failure to take ABX.   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   metoprolol succinate (TOPROL XL) 25 MG 24 hr tablet Take 1 tablet (25 mg total) by mouth daily.     Allergies:   Patient has no known allergies.   Social History   Tobacco Use   Smoking status: Current Some Day Smoker    Packs/day: 0.25    Years: 7.00    Pack years: 1.75    Types: Cigarettes   Smokeless tobacco: Former Neurosurgeon   Tobacco comment: 3 cigs/day, has used vape pens  Substance Use Topics   Alcohol use: No   Drug use: No    Comment: Subutex     Family Hx: The patient's family history includes ADD / ADHD in her son; Asthma in her brother and son; Cancer in her paternal grandmother; Diabetes in her paternal aunt; Drug abuse in her father; Heart disease in her father, paternal grandmother, and paternal uncle; Hypertension in her father, maternal grandfather, maternal grandmother, mother, paternal grandfather, and paternal grandmother; Miscarriages / [place] in her mother; Varicose Veins in her mother.  ROS:   Please see the history of present illness.    Denies any bleeding fevers chills nausea vomiting syncope, no  orthopnea, no shortness of breath All other systems reviewed and are negative.   Prior CV studies:   The following studies were reviewed today:  Echocardiogram from 2018-EF 25%  Labs/Other Tests and Data Reviewed:    EKG:  An ECG dated 09/30/18 was personally reviewed today and demonstrated:  Sinus rhythm with nonspecific ST-T wave changes  Recent Labs: 01/18/2018: ALT 10; B Natriuretic Peptide 779.6; BUN 8; Creatinine, Ser 0.94; Potassium 4.2; Sodium 140 12/29/2018: Hemoglobin 10.7; Platelets 191   Recent Lipid Panel Lab Results  Component Value Date/Time   CHOL 154 04/06/2013 11:51 AM   TRIG 42 04/06/2013 11:51 AM   HDL 72 04/06/2013 11:51 AM   CHOLHDL 2.1 04/06/2013 11:51 AM   LDLCALC 74 04/06/2013 11:51 AM    Wt Readings from Last 3 Encounters:  01/11/19 182 lb (82.6 kg)  12/29/18 182 lb (82.6 kg)  12/01/18 184 lb 9.6 oz (83.7 kg)     Objective:    Vital Signs:  Ht 5\' 4"  (1.626 m)    Wt 182 lb (82.6 kg)    LMP 06/11/2018 (Approximate)    BMI 31.24 kg/m    VITAL SIGNS:  reviewed GEN:  no acute distress EYES:  sclerae anicteric, EOMI - Extraocular Movements Intact RESPIRATORY:  normal respiratory effort, symmetric expansion CARDIOVASCULAR:  minimal peripheral edema noted SKIN:  no rash, lesions or ulcers. MUSCULOSKELETAL:  no obvious deformities. 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  No ACE inhibitor or angiotensin receptor blocker secondary to pregnancy.  She has not required any Lasix therapy.  Thankfully she is currently doing very well.  No heart failure type symptoms.  NYHA class I.  It is certainly plausible that her ejection fraction may have returned to normal as this does happen in up to 60% of cases. - C-section is planned. Use IV lasix as needed, trying to keep I:O balanced peripartum. Let the cardiology team know if we can be of any  assistance.  -She is planning on tubal ligation.  She understands risks of further pregnancies as it relates to cardiomyopathy. - We will go ahead and schedule a virtual visit in 1 month.  Ultimately, I would like to plan on repeating her echocardiogram at a later date. -She understands to contact us if any worrisome symptoms develop.  Remember she did not start to develop symptoms till about 3 weeks postpartum.    COVID-19 Education: The signs and symptoms of COVID-19 were discussed with the patient and how to seek care for testing (follow up with PCP or arrange E-visit).  The importance of social distancing was discussed today.  Time:   Today, I have spent 15 minutes with the patient with telehealth technology discussing the above problems.     Medication Adjustments/Labs and Tests Ordered: Current medicines are reviewed at length with the patient today.  Concerns regarding medicines are outlined above.   Tests Ordered: No orders of the defined types were placed in this encounter.   Medication Changes: No orders of the defined types were placed in this encounter.   Disposition:  Follow up in 1 month(s)  Signed, Donato SchultzMark Theola Cuellar, MD  01/11/2019 11:07 AM    [facility]

## 2019-01-11 NOTE — Patient Instructions (Signed)
Medication Instructions:  The current medical regimen is effective;  continue present plan and medications.  If you need a refill on your cardiac medications before your next appointment, please call your pharmacy.   Follow-Up: Follow up with Dr Anne Fu in 1 month by Virtual appointment as scheduled.  Thank you for choosing  [facility]!!

## 2019-01-13 ENCOUNTER — Encounter: Payer: Self-pay | Admitting: Obstetrics and Gynecology

## 2019-01-13 ENCOUNTER — Ambulatory Visit (INDEPENDENT_AMBULATORY_CARE_PROVIDER_SITE_OTHER): Payer: Medicaid Other | Admitting: Obstetrics and Gynecology

## 2019-01-13 ENCOUNTER — Other Ambulatory Visit: Payer: Self-pay

## 2019-01-13 VITALS — BP 145/92 | HR 81 | Temp 97.1°F | Wt 185.0 lb

## 2019-01-13 DIAGNOSIS — Z3A38 38 weeks gestation of pregnancy: Secondary | ICD-10-CM | POA: Diagnosis not present

## 2019-01-13 DIAGNOSIS — Z98891 History of uterine scar from previous surgery: Secondary | ICD-10-CM

## 2019-01-13 DIAGNOSIS — Z3009 Encounter for other general counseling and advice on contraception: Secondary | ICD-10-CM

## 2019-01-13 DIAGNOSIS — O0993 Supervision of high risk pregnancy, unspecified, third trimester: Secondary | ICD-10-CM | POA: Diagnosis not present

## 2019-01-13 DIAGNOSIS — O0990 Supervision of high risk pregnancy, unspecified, unspecified trimester: Secondary | ICD-10-CM

## 2019-01-13 DIAGNOSIS — O903 Peripartum cardiomyopathy: Secondary | ICD-10-CM

## 2019-01-13 NOTE — Progress Notes (Signed)
Subjective:  Lisa Vazquez is a 33 y.o. [phone] at [redacted]w[redacted]d being seen today for ongoing prenatal care.  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  Patient reports general discomforts of pregnancy.  Contractions: Not present. Vag. Bleeding: None.  Movement: Present. Denies leaking of fluid.   The following portions of the patient's history were reviewed and updated as appropriate: allergies, current medications, past family history, past medical history, past social history, past surgical history and problem list. Problem list updated.  Objective:   Vitals:   01/13/19 1623  BP: (!) 145/92  Pulse: 81  Temp: (!) 97.1 F (36.2 C)  Weight: 185 lb (83.9 kg)    Fetal Status: Fetal Heart Rate (bpm): 140   Movement: Present     General:  Alert, oriented and cooperative. Patient is in no acute distress.  Skin: Skin is warm and dry. No rash noted.   Cardiovascular: Normal heart rate noted  Respiratory: Normal respiratory effort, no problems with respiration noted  Abdomen: Soft, gravid, appropriate for gestational age. Pain/Pressure: Present     Pelvic:  Cervical exam deferred        Extremities: Normal range of motion.  Edema: None  Mental Status: Normal mood and affect.  Normal behavior. Normal judgment and thought content.   Urinalysis:      Assessment and Plan:  Pregnancy: [id number] at [redacted]w[redacted]d  1. Supervision of high risk pregnancy, antepartum Stable  2. History of cesarean delivery Repeat with BTL next weeks  3. Postpartum dilated cardiomyopathy Cleared by cardiology  4. Unwanted fertility BTL papers signed 09/30/18  Term labor symptoms and general obstetric precautions including but not limited to vaginal bleeding, contractions, leaking of fluid and fetal movement were reviewed in detail with the patient. Please refer to After Visit Summary for other counseling recommendations.  No follow-ups on file.   Hermina Staggers, MD

## 2019-01-13 NOTE — Patient Instructions (Signed)

## 2019-01-13 NOTE — Progress Notes (Signed)
ROB c/o hip pain and leg pain.   Pt notes taking Rx pain medication she had left over because she had no relief w/ tylenol.  Declines cervix exam

## 2019-01-17 ENCOUNTER — Encounter (HOSPITAL_COMMUNITY)
Admission: RE | Admit: 2019-01-17 | Discharge: 2019-01-17 | Disposition: A | Discharge status: Home / Self Care | Payer: Medicaid Other | Source: Ambulatory Visit

## 2019-01-18 ENCOUNTER — Encounter (HOSPITAL_COMMUNITY): Payer: Self-pay | Admitting: Anesthesiology

## 2019-01-18 ENCOUNTER — Inpatient Hospital Stay (HOSPITAL_COMMUNITY): Payer: Medicaid Other | Admitting: Anesthesiology

## 2019-01-18 ENCOUNTER — Inpatient Hospital Stay (HOSPITAL_COMMUNITY)
Admission: RE | Admit: 2019-01-18 | Discharge: 2019-01-20 | DRG: 784 | Disposition: A | Discharge status: Home / Self Care | Payer: Medicaid Other | Attending: Family Medicine | Admitting: Family Medicine

## 2019-01-18 ENCOUNTER — Encounter (HOSPITAL_COMMUNITY)
Admission: RE | Disposition: A | Discharge status: Home / Self Care | Payer: Self-pay | Source: Home / Self Care | Attending: Family Medicine

## 2019-01-18 ENCOUNTER — Other Ambulatory Visit: Payer: Self-pay

## 2019-01-18 DIAGNOSIS — I340 Nonrheumatic mitral (valve) insufficiency: Secondary | ICD-10-CM | POA: Diagnosis not present

## 2019-01-18 DIAGNOSIS — O99324 Drug use complicating childbirth: Secondary | ICD-10-CM | POA: Diagnosis present

## 2019-01-18 DIAGNOSIS — D582 Other hemoglobinopathies: Secondary | ICD-10-CM | POA: Diagnosis present

## 2019-01-18 DIAGNOSIS — F1290 Cannabis use, unspecified, uncomplicated: Secondary | ICD-10-CM | POA: Diagnosis present

## 2019-01-18 DIAGNOSIS — O1012 Pre-existing hypertensive heart disease complicating childbirth: Secondary | ICD-10-CM | POA: Diagnosis present

## 2019-01-18 DIAGNOSIS — Z98891 History of uterine scar from previous surgery: Secondary | ICD-10-CM

## 2019-01-18 DIAGNOSIS — O34211 Maternal care for low transverse scar from previous cesarean delivery: Secondary | ICD-10-CM | POA: Diagnosis not present

## 2019-01-18 DIAGNOSIS — F17210 Nicotine dependence, cigarettes, uncomplicated: Secondary | ICD-10-CM | POA: Diagnosis present

## 2019-01-18 DIAGNOSIS — O0932 Supervision of pregnancy with insufficient antenatal care, second trimester: Secondary | ICD-10-CM

## 2019-01-18 DIAGNOSIS — Z3A39 39 weeks gestation of pregnancy: Secondary | ICD-10-CM | POA: Diagnosis not present

## 2019-01-18 DIAGNOSIS — O1002 Pre-existing essential hypertension complicating childbirth: Secondary | ICD-10-CM | POA: Diagnosis not present

## 2019-01-18 DIAGNOSIS — I10 Essential (primary) hypertension: Secondary | ICD-10-CM | POA: Diagnosis present

## 2019-01-18 DIAGNOSIS — Z302 Encounter for sterilization: Secondary | ICD-10-CM

## 2019-01-18 DIAGNOSIS — I5042 Chronic combined systolic (congestive) and diastolic (congestive) heart failure: Secondary | ICD-10-CM | POA: Diagnosis present

## 2019-01-18 DIAGNOSIS — O99334 Smoking (tobacco) complicating childbirth: Secondary | ICD-10-CM | POA: Diagnosis present

## 2019-01-18 DIAGNOSIS — Z3009 Encounter for other general counseling and advice on contraception: Secondary | ICD-10-CM | POA: Diagnosis present

## 2019-01-18 DIAGNOSIS — I509 Heart failure, unspecified: Secondary | ICD-10-CM | POA: Diagnosis present

## 2019-01-18 DIAGNOSIS — O903 Peripartum cardiomyopathy: Secondary | ICD-10-CM | POA: Diagnosis present

## 2019-01-18 DIAGNOSIS — O09219 Supervision of pregnancy with history of pre-term labor, unspecified trimester: Secondary | ICD-10-CM

## 2019-01-18 DIAGNOSIS — O09899 Supervision of other high risk pregnancies, unspecified trimester: Secondary | ICD-10-CM

## 2019-01-18 DIAGNOSIS — I361 Nonrheumatic tricuspid (valve) insufficiency: Secondary | ICD-10-CM | POA: Diagnosis not present

## 2019-01-18 LAB — TYPE AND SCREEN
ABO/RH(D): A POS
Antibody Screen: NEGATIVE

## 2019-01-18 LAB — CBC
HCT: 33.7 % — ABNORMAL LOW (ref 36.0–46.0)
Hemoglobin: 10.8 g/dL — ABNORMAL LOW (ref 12.0–15.0)
MCH: 24.3 pg — ABNORMAL LOW (ref 26.0–34.0)
MCHC: 32 g/dL (ref 30.0–36.0)
MCV: 75.9 fL — ABNORMAL LOW (ref 80.0–100.0)
Platelets: 210 10*3/uL (ref 150–400)
RBC: 4.44 MIL/uL (ref 3.87–5.11)
RDW: 15.6 % — ABNORMAL HIGH (ref 11.5–15.5)
WBC: 8.8 10*3/uL (ref 4.0–10.5)
nRBC: 0 % (ref 0.0–0.2)

## 2019-01-18 LAB — RPR: RPR Ser Ql: NONREACTIVE

## 2019-01-18 SURGERY — Surgical Case
Anesthesia: Spinal | Laterality: Bilateral | Wound class: Clean Contaminated

## 2019-01-18 MED ORDER — CEFAZOLIN SODIUM-DEXTROSE 2-4 GM/100ML-% IV SOLN
2.0000 g | INTRAVENOUS | Status: AC
  Administered 2019-01-18: 2 g via INTRAVENOUS

## 2019-01-18 MED ORDER — SCOPOLAMINE 1 MG/3DAYS TD PT72
MEDICATED_PATCH | TRANSDERMAL | Status: AC
  Filled 2019-01-18: qty 1

## 2019-01-18 MED ORDER — NALOXONE HCL 0.4 MG/ML IJ SOLN: 0.4000 mg | INTRAMUSCULAR | Status: DC | PRN

## 2019-01-18 MED ORDER — ONDANSETRON HCL 4 MG/2ML IJ SOLN: 4.0000 mg | Freq: Four times a day (QID) | INTRAMUSCULAR | Status: DC | PRN

## 2019-01-18 MED ORDER — PHENYLEPHRINE HCL-NACL 20-0.9 MG/250ML-% IV SOLN
INTRAVENOUS | Status: AC
  Filled 2019-01-18: qty 500

## 2019-01-18 MED ORDER — OXYTOCIN 40 UNITS IN NORMAL SALINE INFUSION - SIMPLE MED
INTRAVENOUS | Status: AC
  Filled 2019-01-18: qty 1000

## 2019-01-18 MED ORDER — SODIUM CHLORIDE 0.9% FLUSH: 3.0000 mL | INTRAVENOUS | Status: DC | PRN

## 2019-01-18 MED ORDER — SODIUM CHLORIDE 0.9 % IV SOLN
INTRAVENOUS | Status: DC | PRN
  Administered 2019-01-18: 09:00:00 60 ug/min via INTRAVENOUS

## 2019-01-18 MED ORDER — ONDANSETRON HCL 4 MG/2ML IJ SOLN: 4.0000 mg | Freq: Three times a day (TID) | INTRAMUSCULAR | Status: DC | PRN

## 2019-01-18 MED ORDER — SIMETHICONE 80 MG PO CHEW
80.0000 mg | CHEWABLE_TABLET | Freq: Three times a day (TID) | ORAL | Status: DC
  Administered 2019-01-18 – 2019-01-20 (×5): 80 mg via ORAL
  Filled 2019-01-18 (×6): qty 1

## 2019-01-18 MED ORDER — COCONUT OIL OIL: 1.0000 "application " | TOPICAL_OIL | Status: DC | PRN

## 2019-01-18 MED ORDER — OXYCODONE HCL 5 MG/5ML PO SOLN: 5.0000 mg | Freq: Once | ORAL | Status: DC | PRN

## 2019-01-18 MED ORDER — WITCH HAZEL-GLYCERIN EX PADS: 1.0000 "application " | MEDICATED_PAD | CUTANEOUS | Status: DC | PRN

## 2019-01-18 MED ORDER — KETOROLAC TROMETHAMINE 30 MG/ML IJ SOLN
30.0000 mg | Freq: Four times a day (QID) | INTRAMUSCULAR | Status: AC | PRN
  Administered 2019-01-18: 11:00:00 30 mg via INTRAMUSCULAR

## 2019-01-18 MED ORDER — ZOLPIDEM TARTRATE 5 MG PO TABS: 5.0000 mg | ORAL_TABLET | Freq: Every evening | ORAL | Status: DC | PRN

## 2019-01-18 MED ORDER — SCOPOLAMINE 1 MG/3DAYS TD PT72
1.0000 | MEDICATED_PATCH | Freq: Once | TRANSDERMAL | Status: DC
  Administered 2019-01-18: 12:00:00 1.5 mg via TRANSDERMAL

## 2019-01-18 MED ORDER — DIPHENHYDRAMINE HCL 25 MG PO CAPS: 25.0000 mg | ORAL_CAPSULE | Freq: Four times a day (QID) | ORAL | Status: DC | PRN

## 2019-01-18 MED ORDER — PRENATAL MULTIVITAMIN CH
1.0000 | ORAL_TABLET | Freq: Every day | ORAL | Status: DC
  Administered 2019-01-19 – 2019-01-20 (×2): 1 via ORAL
  Filled 2019-01-18 (×2): qty 1

## 2019-01-18 MED ORDER — SODIUM CHLORIDE 0.9 % IV SOLN
INTRAVENOUS | Status: DC | PRN
  Administered 2019-01-18: 10:00:00 via INTRAVENOUS

## 2019-01-18 MED ORDER — DIPHENHYDRAMINE HCL 25 MG PO CAPS: 25.0000 mg | ORAL_CAPSULE | ORAL | Status: DC | PRN

## 2019-01-18 MED ORDER — OXYTOCIN 40 UNITS IN NORMAL SALINE INFUSION - SIMPLE MED: 2.5000 [IU]/h | INTRAVENOUS | Status: AC

## 2019-01-18 MED ORDER — BUPIVACAINE IN DEXTROSE 0.75-8.25 % IT SOLN
INTRATHECAL | Status: DC | PRN
  Administered 2019-01-18: 2 mL via INTRATHECAL

## 2019-01-18 MED ORDER — MEPERIDINE HCL 25 MG/ML IJ SOLN: 6.2500 mg | INTRAMUSCULAR | Status: DC | PRN

## 2019-01-18 MED ORDER — SIMETHICONE 80 MG PO CHEW: 80.0000 mg | CHEWABLE_TABLET | ORAL | Status: DC | PRN

## 2019-01-18 MED ORDER — ENOXAPARIN SODIUM 40 MG/0.4ML ~~LOC~~ SOLN
40.0000 mg | SUBCUTANEOUS | Status: DC
  Filled 2019-01-18 (×2): qty 0.4

## 2019-01-18 MED ORDER — DIBUCAINE (PERIANAL) 1 % EX OINT: 1.0000 "application " | TOPICAL_OINTMENT | CUTANEOUS | Status: DC | PRN

## 2019-01-18 MED ORDER — ONDANSETRON HCL 4 MG/2ML IJ SOLN
INTRAMUSCULAR | Status: AC
  Filled 2019-01-18: qty 2

## 2019-01-18 MED ORDER — ONDANSETRON HCL 4 MG/2ML IJ SOLN
INTRAMUSCULAR | Status: DC | PRN
  Administered 2019-01-18: 4 mg via INTRAVENOUS

## 2019-01-18 MED ORDER — SODIUM CHLORIDE 0.9 % IV SOLN
INTRAVENOUS | Status: DC | PRN
  Administered 2019-01-18: 40 [IU] via INTRAVENOUS

## 2019-01-18 MED ORDER — NALBUPHINE HCL 10 MG/ML IJ SOLN: 5.0000 mg | Freq: Once | INTRAMUSCULAR | Status: DC | PRN

## 2019-01-18 MED ORDER — CEFAZOLIN SODIUM-DEXTROSE 2-4 GM/100ML-% IV SOLN
INTRAVENOUS | Status: AC
  Filled 2019-01-18: qty 100

## 2019-01-18 MED ORDER — OXYCODONE HCL 5 MG PO TABS: 5.0000 mg | ORAL_TABLET | Freq: Once | ORAL | Status: DC | PRN

## 2019-01-18 MED ORDER — NALOXONE HCL 4 MG/10ML IJ SOLN
1.0000 ug/kg/h | INTRAVENOUS | Status: DC | PRN
  Filled 2019-01-18: qty 5

## 2019-01-18 MED ORDER — NALBUPHINE HCL 10 MG/ML IJ SOLN: 5.0000 mg | INTRAMUSCULAR | Status: DC | PRN

## 2019-01-18 MED ORDER — KETOROLAC TROMETHAMINE 30 MG/ML IJ SOLN
INTRAMUSCULAR | Status: AC
  Filled 2019-01-18: qty 1

## 2019-01-18 MED ORDER — LIDOCAINE-EPINEPHRINE (PF) 2 %-1:200000 IJ SOLN
INTRAMUSCULAR | Status: AC
  Filled 2019-01-18: qty 30

## 2019-01-18 MED ORDER — SENNOSIDES-DOCUSATE SODIUM 8.6-50 MG PO TABS
2.0000 | ORAL_TABLET | ORAL | Status: DC
  Administered 2019-01-19 (×2): 2 via ORAL
  Filled 2019-01-18 (×2): qty 2

## 2019-01-18 MED ORDER — FENTANYL CITRATE (PF) 100 MCG/2ML IJ SOLN
INTRAMUSCULAR | Status: AC
  Filled 2019-01-18: qty 2

## 2019-01-18 MED ORDER — LACTATED RINGERS IV SOLN
125.0000 mL/h | INTRAVENOUS | Status: DC
  Administered 2019-01-18: 125 mL/h via INTRAVENOUS

## 2019-01-18 MED ORDER — FENTANYL CITRATE (PF) 100 MCG/2ML IJ SOLN
25.0000 ug | INTRAMUSCULAR | Status: DC | PRN
  Administered 2019-01-18 (×2): 50 ug via INTRAVENOUS

## 2019-01-18 MED ORDER — DIPHENHYDRAMINE HCL 50 MG/ML IJ SOLN: 12.5000 mg | INTRAMUSCULAR | Status: DC | PRN

## 2019-01-18 MED ORDER — SIMETHICONE 80 MG PO CHEW
80.0000 mg | CHEWABLE_TABLET | ORAL | Status: DC
  Administered 2019-01-19 – 2019-01-20 (×2): 80 mg via ORAL
  Filled 2019-01-18 (×2): qty 1

## 2019-01-18 MED ORDER — LACTATED RINGERS IV SOLN
INTRAVENOUS | Status: DC
  Administered 2019-01-18: 20:00:00 125 mL/h via INTRAVENOUS

## 2019-01-18 MED ORDER — ACETAMINOPHEN 325 MG PO TABS
650.0000 mg | ORAL_TABLET | ORAL | Status: DC
  Administered 2019-01-18 – 2019-01-20 (×11): 650 mg via ORAL
  Filled 2019-01-18 (×12): qty 2

## 2019-01-18 MED ORDER — MORPHINE SULFATE (PF) 0.5 MG/ML IJ SOLN
INTRAMUSCULAR | Status: AC
  Filled 2019-01-18: qty 10

## 2019-01-18 MED ORDER — OXYCODONE HCL 5 MG PO TABS
5.0000 mg | ORAL_TABLET | ORAL | Status: DC | PRN
  Administered 2019-01-18 – 2019-01-20 (×11): 10 mg via ORAL
  Filled 2019-01-18 (×11): qty 2

## 2019-01-18 MED ORDER — MENTHOL 3 MG MT LOZG: 1.0000 | LOZENGE | OROMUCOSAL | Status: DC | PRN

## 2019-01-18 MED ORDER — KETOROLAC TROMETHAMINE 30 MG/ML IJ SOLN: 30.0000 mg | Freq: Four times a day (QID) | INTRAMUSCULAR | Status: AC | PRN

## 2019-01-18 MED ORDER — FENTANYL CITRATE (PF) 100 MCG/2ML IJ SOLN
INTRAMUSCULAR | Status: DC | PRN
  Administered 2019-01-18: 35 ug via INTRAVENOUS
  Administered 2019-01-18: 15 ug via INTRATHECAL

## 2019-01-18 MED ORDER — MORPHINE SULFATE (PF) 0.5 MG/ML IJ SOLN
INTRAMUSCULAR | Status: DC | PRN
  Administered 2019-01-18: 150 mg via INTRATHECAL

## 2019-01-18 MED ORDER — METOPROLOL SUCCINATE ER 25 MG PO TB24
25.0000 mg | ORAL_TABLET | Freq: Every day | ORAL | Status: DC
  Administered 2019-01-19: 25 mg via ORAL
  Filled 2019-01-18 (×2): qty 1

## 2019-01-18 SURGICAL SUPPLY — 38 items
BENZOIN TINCTURE PRP APPL 2/3 (GAUZE/BANDAGES/DRESSINGS) ×3 IMPLANT
CATH ROBINSON RED A/P 16FR (CATHETERS) IMPLANT
CELLS DAT CNTRL 66122 CELL SVR (MISCELLANEOUS) ×1 IMPLANT
CHLORAPREP W/TINT 26ML (MISCELLANEOUS) ×3 IMPLANT
CLAMP CORD UMBIL (MISCELLANEOUS) IMPLANT
CLOSURE WOUND 1/2 X4 (GAUZE/BANDAGES/DRESSINGS) ×1
CLOTH BEACON ORANGE TIMEOUT ST (SAFETY) ×3 IMPLANT
DRSG OPSITE POSTOP 4X10 (GAUZE/BANDAGES/DRESSINGS) ×3 IMPLANT
ELECT REM PT RETURN 9FT ADLT (ELECTROSURGICAL) ×3
ELECTRODE REM PT RTRN 9FT ADLT (ELECTROSURGICAL) ×1 IMPLANT
EXTRACTOR VACUUM M CUP 4 TUBE (SUCTIONS) IMPLANT
EXTRACTOR VACUUM M CUP 4' TUBE (SUCTIONS)
GLOVE BIOGEL PI IND STRL 7.0 (GLOVE) ×1 IMPLANT
GLOVE BIOGEL PI IND STRL 7.5 (GLOVE) ×1 IMPLANT
GLOVE BIOGEL PI INDICATOR 7.0 (GLOVE) ×2
GLOVE BIOGEL PI INDICATOR 7.5 (GLOVE) ×2
GLOVE ECLIPSE 7.5 STRL STRAW (GLOVE) ×3 IMPLANT
GOWN STRL REUS W/TWL LRG LVL3 (GOWN DISPOSABLE) ×9 IMPLANT
HEMOSTAT ARISTA ABSORB 3G PWDR (HEMOSTASIS) ×3 IMPLANT
KIT ABG SYR 3ML LUER SLIP (SYRINGE) IMPLANT
NEEDLE HYPO 25X5/8 SAFETYGLIDE (NEEDLE) IMPLANT
NS IRRIG 1000ML POUR BTL (IV SOLUTION) ×3 IMPLANT
PACK C SECTION WH (CUSTOM PROCEDURE TRAY) ×3 IMPLANT
PAD OB MATERNITY 4.3X12.25 (PERSONAL CARE ITEMS) ×3 IMPLANT
PENCIL SMOKE EVAC W/HOLSTER (ELECTROSURGICAL) ×3 IMPLANT
RTRCTR C-SECT PINK 25CM LRG (MISCELLANEOUS) ×3 IMPLANT
RTRCTR WOUND ALEXIS 18CM MED (MISCELLANEOUS) ×3
STRIP CLOSURE SKIN 1/2X4 (GAUZE/BANDAGES/DRESSINGS) ×2 IMPLANT
SUT PLAIN 2 0 (SUTURE) ×2
SUT PLAIN ABS 2-0 54XMFL TIE (SUTURE) ×1 IMPLANT
SUT VIC AB 0 CTX 36 (SUTURE) ×6
SUT VIC AB 0 CTX36XBRD ANBCTRL (SUTURE) ×3 IMPLANT
SUT VIC AB 2-0 CT1 27 (SUTURE) ×2
SUT VIC AB 2-0 CT1 TAPERPNT 27 (SUTURE) ×1 IMPLANT
SUT VIC AB 4-0 KS 27 (SUTURE) ×3 IMPLANT
TOWEL OR 17X24 6PK STRL BLUE (TOWEL DISPOSABLE) ×3 IMPLANT
TRAY FOLEY W/BAG SLVR 14FR LF (SET/KITS/TRAYS/PACK) ×3 IMPLANT
WATER STERILE IRR 1000ML POUR (IV SOLUTION) ×3 IMPLANT

## 2019-01-18 NOTE — Anesthesia Preprocedure Evaluation (Signed)
Anesthesia Evaluation  Patient identified by MRN, date of birth, ID band Patient awake    Reviewed: Allergy & Precautions, H&P , NPO status , Patient's Chart, lab work & pertinent test results  Airway Mallampati: II   Neck ROM: full    Dental   Pulmonary Current Smoker,    breath sounds clear to auscultation       Cardiovascular hypertension, +CHF   Rhythm:regular Rate:Normal  Post-partum cardiomyopathy (2018). EF was 25%. Recent note from cardiologist Anne Fu) states that patient is currently doing better with no CHF symptoms.   Neuro/Psych    GI/Hepatic   Endo/Other  diabetes, Gestational  Renal/GU      Musculoskeletal   Abdominal   Peds  Hematology   Anesthesia Other Findings   Reproductive/Obstetrics                             Anesthesia Physical Anesthesia Plan  ASA: II  Anesthesia Plan: Spinal   Post-op Pain Management:    Induction: Intravenous  PONV Risk Score and Plan: 1 and Treatment may vary due to age or medical condition  Airway Management Planned: Simple Face Mask  Additional Equipment:   Intra-op Plan:   Post-operative Plan:   Informed Consent: I have reviewed the patients History and Physical, chart, labs and discussed the procedure including the risks, benefits and alternatives for the proposed anesthesia with the patient or authorized representative who has indicated his/her understanding and acceptance.       Plan Discussed with: Anesthesiologist, CRNA and Surgeon  Anesthesia Plan Comments:         Anesthesia Quick Evaluation

## 2019-01-18 NOTE — Op Note (Signed)
MELMA LAZOR PROCEDURE DATE: 01/18/2019  PREOPERATIVE DIAGNOSIS: Intrauterine pregnancy at  [redacted]w[redacted]d weeks gestation; history of prior Cesarean section x4 POSTOPERATIVE DIAGNOSIS: The same PROCEDURE: Repeat Low Transverse Cesarean Section  SURGEON:  Dr. Candelaria Celeste ASSISTANT: Dr. Rhett Bannister  INDICATIONS: Lisa Vazquez is a 33 y.o. [id number] at [redacted]w[redacted]d scheduled for cesarean section secondary to history of prior Cesarean section.  The risks of cesarean section discussed with the patient included but were not limited to: bleeding which may require transfusion or reoperation; infection which may require antibiotics; injury to bowel, bladder, ureters or other surrounding organs; injury to the fetus; need for additional procedures including hysterectomy in the event of a life-threatening hemorrhage; placental abnormalities wth subsequent pregnancies, incisional problems, thromboembolic phenomenon and other postoperative/anesthesia complications. The patient concurred with the proposed plan, giving informed written consent for the procedure.    FINDINGS:  Viable female infant in vertex presentation.  Apgars 9 and 9, weight pending but AGA.  Clear amniotic fluid.  Intact placenta, three vessel cord.  Normal uterus, fallopian tubes and ovaries bilaterally. Few intraabdominal adhesions noted.  ANESTHESIA:    Spinal INTRAVENOUS FLUIDS:1300 ml ESTIMATED BLOOD LOSS: 357 ml URINE OUTPUT:  200 ml SPECIMENS: Placenta sent to L&D COMPLICATIONS: None immediate  PROCEDURE IN DETAIL:  The patient received intravenous antibiotics and had sequential compression devices applied to her lower extremities while in the preoperative area.  She was then taken to the operating room where spinal anesthesia was administered and was found to be adequate. She was then placed in a dorsal supine position with a leftward tilt, and prepped and draped in a sterile manner.  A foley catheter was placed into her bladder  and attached to constant gravity, which drained clear fluid throughout.  After an adequate timeout was performed, a Pfannenstiel skin incision was made with scalpel and carried through to the underlying layer of fascia. The fascia was incised in the midline and this incision was extended bilaterally using the Mayo scissors. Kocher clamps were applied to the superior aspect of the fascial incision and the underlying rectus muscles were dissected off bluntly. A similar process was carried out on the inferior aspect of the facial incision. The rectus muscles were separated in the midline bluntly and the peritoneum was entered bluntly. An Alexis retractor was placed to aid in visualization of the uterus.  Attention was turned to the lower uterine segment where a transverse hysterotomy was made with a scalpel and extended bilaterally bluntly. The infant was successfully delivered, and cord was clamped and cut and infant was handed over to awaiting neonatology team. Uterine massage was then administered and the placenta delivered intact with three-vessel cord. The uterus was then cleared of clot and debris.  The hysterotomy was closed with 0 Monocryl in a running locked fashion, and an imbricating layer was also placed with a 0 Monocryl.  Pomery: The right Fallopian tube was identified, grasped with the Babcock clamps. An avascular midsection of the tube approximately 3-4cm from the cornua was grasped with the babcock clamps and brought into a knuckle. The tube was double ligated with one 0 plain gut suture and the intervening portion of tube was transected and removed.  Attention was then turned to the left fallopian tube after confirmation of identification by tracing the tube out to the fimbriae. The same procedure was then performed on the left Fallopian tube, with excellent hemostasis noted at both BTL sites.  Overall, excellent hemostasis was noted and Arista was also placed  over the hysterotomy. The abdomen and  the pelvis were cleared of all clot and debris and the Jon Gillslexis was removed. Hemostasis was confirmed on all surfaces.  The peritoneum was reapproximated using 2-0 Monocryl running stitches. The fascia was then closed using looped PDS in a running fashion. The skin was closed with 3-0 Monocryl. The patient tolerated the procedure well. Sponge, lap, instrument and needle counts were correct x 3. She was taken to the recovery room in stable condition.   Tamera StandsWallace, Jeanise Durfey S, DO 01/18/2019 11:01 AM

## 2019-01-18 NOTE — Progress Notes (Signed)
Dr Gwinda Passe notified that pt is wanting Oxy IR for pain control.  Permission given to take earlier than 12 hours for pain management

## 2019-01-18 NOTE — Anesthesia Procedure Notes (Signed)
Spinal  Patient location during procedure: OR Start time: 01/18/2019 9:05 AM End time: 01/18/2019 9:07 AM Staffing Anesthesiologist: Achille Rich, MD Performed: anesthesiologist  Preanesthetic Checklist Completed: patient identified, surgical consent, pre-op evaluation, timeout performed, IV checked, risks and benefits discussed and monitors and equipment checked Spinal Block Patient position: sitting Prep: DuraPrep Patient monitoring: cardiac monitor, continuous pulse ox and blood pressure Approach: midline Location: L3-4 Injection technique: single-shot Needle Needle type: Pencan  Needle gauge: 24 G Needle length: 9 cm Assessment Sensory level: T10 Additional Notes Functioning IV was confirmed and monitors were applied. Sterile prep and drape, including hand hygiene and sterile gloves were used. The patient was positioned and the spine was prepped. The skin was anesthetized with lidocaine.  Free flow of clear CSF was obtained prior to injecting local anesthetic into the CSF.  The spinal needle aspirated freely following injection.  The needle was carefully withdrawn.  The patient tolerated the procedure well.

## 2019-01-18 NOTE — Lactation Note (Signed)
This note was copied from a baby's chart. Lactation Consultation Note  Patient Name: Lisa Vazquez LVDIX'V Date: 01/18/2019 Reason for consult: Initial assessment;Term;Mother's request P5, 13 hour female infant. Mom's feeding choice is breast and formula feeding. Per mom, infant only latched once she has been having difficulties latching infant to breast. Per mom she wants to use DEBP, she pumped with her other 4 children she did not latch them to breast she gave them pumped breast milk and formula.  Mom shown how to use DEBP & how to disassemble, clean, & reassemble parts. LC discussed hand expression and mom taught back, infant was given 5 ml of colostrum on spoon. LC notice mom nipples are everted. Infant was not cuing per mom, she given infant 20 ml of Gerber Gentle formula prior to [facility] entering the room. Mom is willing to work on latching infant to breast and try breastfeeding in hospital.  Mom knows to breastfeed according to hunger cues, 8 or more times within 24 hours. Mom was doing STS when [facility] left the room. LC discussed I & O. Reviewed Baby & Me book's Breastfeeding Basics.  Mom will ask Nurse or LC for assistance if she has any questions, concerns or needs help with latching infant to breast. Mom made aware of O/P services, breastfeeding support groups, community resources, and our phone # for post-discharge questions.  Mom's plans: 1. Mom will breastfeed according hunger cues, 8 or more times within 24 hours. 2. Mom will work towards latching infant to breast. 3. Mom will give EBM from pumping/ or formula according infant age/ hours. 4. Mom will use DEBP every 3 hours on initial setting for 15 minutes ( mom's choice).  Maternal Data Formula Feeding for Exclusion: Yes Reason for exclusion: Mother's choice to formula and breast feed on admission Has patient been taught Hand Expression?: Yes(infant given 5 ml of colostrum on spoon ) Does the patient have breastfeeding  experience prior to this delivery?: Yes  Feeding Feeding Type: Breast Fed  LATCH Score                   Interventions Interventions: Breast feeding basics reviewed;Hand express;Skin to skin  Lactation Tools Discussed/Used WIC Program: No Pump Review: Setup, frequency, and cleaning;Milk Storage Initiated by:: Danelle Earthly, IBCLC Date initiated:: 01/18/19   Consult Status Consult Status: Follow-up Date: 01/19/19 Follow-up type: In-patient    Danelle Earthly 01/18/2019, 11:07 PM

## 2019-01-18 NOTE — H&P (Signed)
OBSTETRIC ADMISSION HISTORY AND PHYSICAL  Lisa Vazquez is a 33 y.o. female [phone] with IUP at [redacted]w[redacted]d by L/24 presenting for scheduled repeat LTCS.   She received her prenatal care at [facility]  Support person in labor: Partner  Ultrasounds . Anatomy U/S: 24w1: normal anatomy U/S, EFW 52%, anterior placenta . 28w1: normal interval growth, EFW 48% . 32w1: EFW 51%  Prenatal History/Complications: . History of postpartum cardiomyopathy - saw cardiology this pregnancy but no recent echo, switched from coreg to toprol . Chronic hypertension . Marijuana use . Limited prenatal care . History of C/S x 4  Past Medical History: Past Medical History:  Diagnosis Date  . Acute CHF (HCC) 03/15/2017  . Atypical squamous cell changes of undetermined significance (ASCUS) on cervical cytology with positive high risk human papilloma virus (HPV) 03/17/2013   12/2012 pap smear ASCUS +HPV-  03/17/2013 colpo- Needs Colpo postpartum 2018  . Bacterial vaginal infection 04/06/2013  . Bacterial vaginosis 04/06/2013  . BV (bacterial vaginosis)   . CHF (congestive heart failure) (HCC)   . Chronic hypertension during pregnancy, antepartum 07/17/2016    Aspirin 81 mg daily after 12 weeks; discontinue after 36 weeks--Rx sent Current antihypertensives:  None   Baseline and surveillance labs (pulled in from [facility], refresh links as needed)  Lab Results Component Value Date  PLT 240 07/05/2016  CREATININE 0.74 07/17/2016  AST 11 07/17/2016  ALT 5 07/17/2016  Baseline PCR 347  Antenatal Testing CHTN - O10.919  Group I  BP < 140/90, no preeclampsia, AGA,  nml AFV, +/- meds    Group II BP > 140/90, on meds, no preeclampsia, AGA, nml AFV  20-28-34-38  20-24-28-32-35-38  32//2 x wk  28//BPP wkly then 32//2 x wk  40 no meds; 39 meds  PRN or 37 Pre-eclampsia  GHTN - O13.9/Preeclampsia without severe features  - O14.00   Preeclampsia with severe features - O14.10  Q 3-4wks  Q 2 wks  28//BPP wkly then 32//2 x wk  Inpatient  37   PRN or 34    . CIN I (cervical intraepithelial neoplasia I) 04/06/2013  . Essential hypertension 03/08/2017  . Gestational diabetes    gestational  . 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   . History of cesarean delivery 02/03/2017  . History of recurrent UTIs 07/24/2016   Need UTI suppression  Ask if she took Keflex 07/06/16. Culture Positive 07/17/16. Not she if this is Tx failure or failure to take ABX.   Patient did finally take ATB- she is now on suppression  . Hypertension   . Kidney infection   . Marijuana use 07/26/2016   Pos 07/11/16  . Postpartum care and examination 03/18/2017  . 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 Pregnancy induced hypertension   . Systolic and diastolic CHF, acute (HCC) 03/08/2017  . UTI (lower urinary tract infection)     Past Surgical History: Past Surgical History:  Procedure Laterality Date  . CESAREAN SECTION     x3  . CESAREAN SECTION WITH BILATERAL TUBAL LIGATION Bilateral 02/04/2017   pt denies having had tubal ligation; unable to delete  . TOOTH EXTRACTION  06/2018    Obstetrical History: OB History    Gravida  6   Para  4   Term  2   Preterm  2   AB  1   Living  4     SAB      TAB  1  Ectopic  0   Multiple  0   Live Births  4           Social History: Social History   Socioeconomic History  . Marital status: Single    Spouse name: Not on file  . Number of children: 4  . Years of education: Not on file  . Highest education level: Not on file  Occupational History  . Occupation: unemployed  Social Needs  . Financial resource strain: Not hard at all  . Food insecurity:    Worry: Never true    Inability: Never true  . Transportation needs:    Medical: No    Non-medical: Not on file  Tobacco Use  . Smoking status: Current Some Day Smoker    Packs/day: 0.25    Years: 7.00    Pack years: 1.75    Types: Cigarettes  . Smokeless tobacco: Former Neurosurgeon   . Tobacco comment: 3 cigs/day, has used vape pens  Substance and Sexual Activity  . Alcohol use: No  . Drug use: No    Comment: Subutex  . Sexual activity: Yes  Lifestyle  . Physical activity:    Days per week: Not on file    Minutes per session: Not on file  . Stress: Rather much  Relationships  . Social connections:    Talks on phone: Not on file    Gets together: Not on file    Attends religious service: Not on file    Active member of club or organization: Not on file    Attends meetings of clubs or organizations: Not on file    Relationship status: Not on file  Other Topics Concern  . Not on file  Social History Narrative  . Not on file    Family History: Family History  Problem Relation Age of Onset  . Hypertension Mother   . Miscarriages / [place] Mother   . Varicose Veins Mother   . Heart disease Father   . Hypertension Father   . Drug abuse Father   . Asthma Brother   . Hypertension Maternal Grandmother   . Hypertension Maternal Grandfather   . Heart disease Paternal Grandmother   . Hypertension Paternal Grandmother   . Cancer Paternal Grandmother   . Hypertension Paternal Grandfather   . Asthma Son   . ADD / ADHD Son   . Diabetes Paternal Aunt   . Heart disease Paternal Uncle     Allergies: No Known Allergies  Medications Prior to Admission  Medication Sig Dispense Refill Last Dose  . acetaminophen (TYLENOL) 500 MG tablet Take 1,000 mg by mouth every 6 (six) hours as needed for mild pain or headache.   01/17/2019 at Unknown time  . metoprolol succinate (TOPROL XL) 25 MG 24 hr tablet Take 1 tablet (25 mg total) by mouth daily. 90 tablet 3 01/18/2019 at Unknown time     Review of Systems  All systems reviewed and negative except as stated in HPI  Blood pressure (!) 143/97, pulse 77, temperature 97.8 F (36.6 C), temperature source Axillary, height  (1.626 m), weight 84.8 kg, last menstrual period 06/11/2018, SpO2 99 %, not currently  breastfeeding. General appearance: alert, well-appearing, NAD Lungs: no respiratory distress Heart: regular rate  Abdomen: soft, non-tender; gravid  prior incision without significant scar tissue Pelvic: deferred Extremities: SCDs in place, no significant edema Fetal monitoring: 130s by Doppler    Prenatal labs: ABO, Rh: --/--/PENDING (04/28 1610) Antibody: PENDING (04/28 9604) Rubella: 4.45 (01/09  1551) RPR: Non Reactive (04/08 0946)  HBsAg: Negative (01/09 1551)  HIV: Non Reactive (04/08 0952)  GBS: Negative (04/08 0953)  Glucola: 1hr GTT wnl Genetic screening:  Low risk NIPS  negative AFP screen   Prenatal Transfer Tool  Maternal Diabetes: No Genetic Screening: Normal Maternal Ultrasounds/Referrals: Normal Fetal Ultrasounds or other Referrals:  None Maternal Substance Abuse:  Yes:  Type: Smoker, Marijuana Significant Maternal Medications:  Coreg > toprol, ASA, PNV Significant Maternal Lab Results: None  Results for orders placed or performed during the hospital encounter of 01/18/19 (from the past 24 hour(s))  CBC   Collection Time: 01/18/19  8:17 AM  Result Value Ref Range   WBC 8.8 4.0 - 10.5 K/uL   RBC 4.44 3.87 - 5.11 MIL/uL   Hemoglobin 10.8 (L) 12.0 - 15.0 g/dL   HCT 11.933.7 (L) 14.736.0 - 82.946.0 %   MCV 75.9 (L) 80.0 - 100.0 fL   MCH 24.3 (L) 26.0 - 34.0 pg   MCHC 32.0 30.0 - 36.0 g/dL   RDW 56.215.6 (H) 13.011.5 - 86.515.5 %   Platelets 210 150 - 400 K/uL   nRBC 0.0 0.0 - 0.2 %  Type and screen   Collection Time: 01/18/19  8:17 AM  Result Value Ref Range   ABO/RH(D) PENDING    Antibody Screen PENDING    Sample Expiration      01/21/2019 Performed at [facility], [address] [place], [place] [address]     Patient Active Problem List   Diagnosis Date Noted  . Status post cesarean delivery 01/18/2019  . Unwanted fertility 12/23/2018  . Supervision of high risk pregnancy, antepartum 09/30/2018  . History of preterm delivery, currently pregnant 09/30/2018  .  Late prenatal care affecting pregnancy in second trimester 09/30/2018  . Acute CHF (HCC) 03/15/2017  . 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 Systolic and diastolic CHF, acute (HCC) 03/08/2017  . Essential hypertension 03/08/2017  . CHF (congestive heart failure) (HCC) 03/08/2017  . History of cesarean delivery 02/03/2017  . Marijuana use 07/26/2016  . 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 High blood hemoglobin F (HCC) 07/26/2016  . CIN I (cervical intraepithelial neoplasia I) 04/06/2013  . Atypical squamous cell changes of undetermined significance (ASCUS) on cervical cytology with positive high risk human papilloma virus (HPV) 03/17/2013    Assessment/Plan:  Asencion IslamDominique M Peary is a 33 y.o. [id number] at 7331w0d here for schedule repeat Cesarean section.  The risks of cesarean section discussed with the patient included but were not limited to: bleeding which may require transfusion or reoperation; infection which may require antibiotics; injury to bowel, bladder, ureters or other surrounding organs; injury to the fetus; need for additional procedures including hysterectomy in the event of a life-threatening hemorrhage; placental abnormalities wth subsequent pregnancies, incisional problems, thromboembolic phenomenon and other postoperative/anesthesia complications. The patient concurred with the proposed plan, giving informed written consent for the procedure.   Patient has been NPO since last night, and she will remain NPO for procedure. Anesthesia and OR aware. Preoperative prophylactic antibiotics and SCDs ordered on call to the OR.  To OR when ready.  Patient also desires permanent sterilization.  Other reversible forms of contraception were discussed with patient; she declines all other modalities. Risks of procedure discussed with patient including but not limited to: risk of regret, permanence of method, bleeding, infection, injury to  surrounding organs and need for additional procedures.  Failure risk of 1-2% with increased risk of ectopic  gestation if pregnancy occurs was also discussed with patient.  The patient concurred with the proposed plan, giving informed written consent for the procedures.     Postpartum Planning -- breast/BTL -- RI/[x] Tdap  -- given history of postpartum cardiomyopathy, will monitor for symptoms and consider diuresis and follow-up ECHO while admitted  likely to restart coreg 3.125mg  BID post-op   Juandavid Dallman S. Earlene Plater, DO OB/GYN Fellow

## 2019-01-18 NOTE — Discharge Summary (Signed)
Obstetrics Discharge Summary OB/GYN Faculty Practice   Patient Name: Lisa Vazquez DOB: 1986-04-27 MRN: [id number]  Date of admission: 01/18/2019 Delivering Lisa Vazquez: Levie Heritage   Date of discharge: 01/20/2019  Admitting diagnosis: RCS Undesired Fertility Intrauterine pregnancy: [redacted]w[redacted]d     Secondary diagnosis:   Principal Problem:   History of cesarean delivery Active Problems:   Marijuana use   High blood hemoglobin F (HCC)   Essential hypertension   History of Postpartum Dilated Cardiomyopathy    History of preterm delivery, currently pregnant   Late prenatal care affecting pregnancy in second trimester   Unwanted fertility   Status post cesarean delivery   Cesarean delivery delivered    Discharge diagnosis: Term Pregnancy Delivered by Repeat LTCS    Postpartum procedures: None  Complications: None  Outpatient Follow-Up:  incision check  BP check  follow up with cardiology for history of cardiomyopathy and persistent abnormal ECHO  Hospital course: Lisa Vazquez is a 33 y.o. [redacted]w[redacted]d who was admitted for scheduled repeat LTCS. Her pregnancy was complicated by above noted. Delivery was uncomplicated. Her HgB trended from 10.8 to 8.4 on POD#1. Please see op note for additional details. Her postpartum course was uncomplicated. She denied any symptoms of heart failure on discharge including chest pain, shortness of breath, BLE edema.  An ECHO was performed on postpartum day 2 and showed persistent low ejection fraction of 25-30% and severe mitral regurgitation amongst several abnormalities (see report below). Discussed results with Lisa Vazquez (Cardiology), she feels her anomalies is likely chronic in nature and patient is at high risk for ventricular arrhthymias. She will need to take her medications as prescribed and come to ER immediately for any cardiac symptoms. Follow up already scheduled on 02/11/2019 with Cardiology, she is aware of this appointment. Strict  cardiac symptom precautions reviewed with patient, and emphasized importance of taking her medications as instructed. Discharged on Metoprolol 25 mg XL and Enalapril 10 mg daily.  01/20/2019   Procedure: 2D Echo Indications:    Dilated Cardiomyopathy.  History:    Patient has prior history of Echocardiogram examinations, most                 recent 03/09/2017. Postpartum Dilated Cardiomyopathy Risk                 Factors: Hypertension and Current Smoker.  Sonographer:    Lisa Coyer RDCS (AE) Referring Phys: Lisa Vazquez IMPRESSIONS  1. Severe akinesis of the left ventricular, basal inferior wall.  2. The left ventricle has severely reduced systolic function, with an ejection fraction of 25-30%. The cavity size was mild to moderately dilated. Left ventricular diastolic Doppler parameters are consistent with pseudonormalization.  3. The right ventricle has normal systolic function. The cavity was normal. There is no increase in right ventricular wall thickness.  4. Mitral valve regurgitation is severe by color flow Doppler.  5. The tricuspid valve is grossly normal.  6. The aortic root and ascending aorta are normal in size and structure.  7. The interatrial septum was not assessed. FINDINGS  Left Ventricle: The left ventricle has severely reduced systolic function, with an ejection fraction of 25-30%. The cavity size was mild to moderately dilated. There is no increase in left ventricular wall thickness. Left ventricular diastolic Doppler parameters are consistent with pseudonormalization. Severe akinesis of the left ventricular, basal inferior wall. Right Ventricle: The right ventricle has normal systolic function. The cavity was normal. There is no increase  in right ventricular wall thickness. Left Atrium: Left atrial size was normal in size. Right Atrium: Right atrial size was normal in size. Right atrial pressure is estimated at 10 mmHg. Interatrial Septum: The  interatrial septum was not assessed. Pericardium: There is no evidence of pericardial effusion. Mitral Valve: The mitral valve is normal in structure. Mitral valve regurgitation is severe by color flow Doppler. Tricuspid Valve: The tricuspid valve is grossly normal. Tricuspid valve regurgitation is mild by color flow Doppler. Aortic Valve: The aortic valve is normal in structure. Aortic valve regurgitation was not visualized by color flow Doppler. Pulmonic Valve: The pulmonic valve was not assessed. Pulmonic valve regurgitation is not visualized by color flow Doppler. Aorta: The aortic root and ascending aorta are normal in size and structure. Venous: The inferior vena cava is normal in size with greater than 50% respiratory variability.   From an obstetric standpoint, by day of discharge, she was passing flatus, urinating, eating and drinking without difficulty. Her pain was well-controlled, and she was discharged home with Percocet. She was also discharge to home. She will follow-up in clinic in 2 weeks.   Physical exam  Vitals:   01/19/19 2106 01/20/19 0510 01/20/19 0946 01/20/19 1310  BP: (!) 144/76 (!) 131/96 110/77 122/81  Pulse: 70 75 79 69  Resp: 20 16 18    Temp: 98.5 F (36.9 Vazquez) 98.3 F (36.8 Vazquez) 98.4 F (36.9 Vazquez)   TempSrc: Oral Oral Oral   SpO2:  98%    Weight:      Height:       General: NAD Lochia: appropriate Uterine Fundus: firm Incision: Healing well with no significant drainage, Dressing is clean, dry, and intact DVT Evaluation: No evidence of DVT seen on physical exam. Negative Homan's sign. No cords or calf tenderness. Labs: Lab Results  Component Value Date   WBC 10.4 01/19/2019   HGB 8.4 (Vazquez) 01/19/2019   HCT 26.6 (Vazquez) 01/19/2019   MCV 75.8 (Vazquez) 01/19/2019   PLT 163 01/19/2019   CMP Latest Ref Rng & Units 01/19/2019  Glucose 65 - 99 mg/dL -  BUN 6 - 20 mg/dL -  Creatinine 4.38 - 8.87 mg/dL 5.79  Sodium 728 - 206 mmol/Vazquez -  Potassium 3.5 - 5.1 mmol/Vazquez  -  Chloride 101 - 111 mmol/Vazquez -  CO2 22 - 32 mmol/Vazquez -  Calcium 8.9 - 10.3 mg/dL -  Total Protein 6.5 - 8.1 g/dL -  Total Bilirubin 0.3 - 1.2 mg/dL -  Alkaline Phos 38 - 015 U/Vazquez -  AST 15 - 41 U/Vazquez -  ALT 14 - 54 U/Vazquez -    Discharge instructions: Per After Visit Summary and "Baby and Me Booklet"  After visit meds:  Allergies as of 01/20/2019   No Known Allergies     Medication List    STOP taking these medications   acetaminophen 500 MG tablet Commonly known as:  TYLENOL     TAKE these medications   enalapril 10 MG tablet Commonly known as:  VASOTEC Take 1 tablet (10 mg total) by mouth daily. Start taking on:  Jan 21, 2019   ferrous sulfate 325 (65 FE) MG tablet Take 1 tablet (325 mg total) by mouth 2 (two) times daily with a meal.   IMMUNE SUPPORT PO Take 2 each by mouth daily. [facility] immune boost gummies   metoprolol succinate 25 MG 24 hr tablet Commonly known as:  Toprol XL Take 1 tablet (25 mg total) by mouth daily.   oxyCODONE-acetaminophen 5-325  MG tablet Commonly known as:  PERCOCET/ROXICET Take 1 tablet by mouth every 4 (four) hours as needed for severe pain.   senna-docusate 8.6-50 MG tablet Commonly known as:  Senokot-S Take 2 tablets by mouth 2 (two) times daily as needed for mild constipation or moderate constipation.       Postpartum contraception: BTL performed postpartum with Vazquez/S Diet: Routine Diet Activity: Advance as tolerated. Pelvic rest for 6 weeks.   Follow-up Appts: Future Appointments  Date Time Provider Department Center  02/01/2019 10:00 AM Lisa Vazquez, Lisa Vazquez, Lisa Vazquez [facility] None  02/11/2019  9:00 AM Lisa Vazquez, Lisa Vazquez, Lisa Vazquez [facility]  02/18/2019 11:00 AM Lisa Vazquez, Lisa Vazquez, Lisa Vazquez [facility] None  Please schedule this patient for Postpartum visit in: 4-6 weeks with the following provider: MD For Vazquez/S patients schedule nurse incision check in weeks 2 weeks: yes High risk pregnancy complicated by: cHTN, hx PP cardiomyopathy, MJ use Delivery  mode:  CS Anticipated Birth Control:  BTL done PP PP Procedures needed: BP check and incision check  Schedule Integrated BH visit: no  Newborn Data: Live born female  Birth Weight:  2725g APGAR: 9, 9  Newborn Delivery   Birth date/time:  01/18/2019 09:36:00 Delivery type:  Vazquez-Section, Low Transverse Trial of labor:  No Vazquez-section categorization:  Repeat    Baby Feeding: Both Breast and Bottle Disposition:home with mother, was signed out AMA (refer to baby's chart for further details)   Jaynie CollinsUGONNA  Shikara Mcauliffe, Lisa Vazquez, FACOG Obstetrician & Gynecologist, [facility], [facility]

## 2019-01-18 NOTE — Progress Notes (Signed)
Called to room by RN because patient dressing with blood oozing. Honeycomb completely saturated, no pressure dressing in place. Removed honeycomb dressing, removed steri-strips. Wound edges well-approximated. Some minimal oozing with fundal pressure but responded well to pressure. Replaced steri-strips and honeycomb. Applied pressure dressing. Encouraged to keep pressure dressing in place for about 24 hours. Will continue to monitor closely.   Cristal Deer. Earlene Plater, DO OB/GYN Fellow

## 2019-01-18 NOTE — Transfer of Care (Signed)
Immediate Anesthesia Transfer of Care Note  Patient: Lisa Vazquez  Procedure(s) Performed: CESAREAN SECTION WITH BILATERAL TUBAL LIGATION (Bilateral )  Patient Location: PACU  Anesthesia Type:Spinal  Level of Consciousness: awake, alert , oriented and patient cooperative  Airway & Oxygen Therapy: Patient Spontanous Breathing  Post-op Assessment: Report given to RN and Post -op Vital signs reviewed and stable  Post vital signs: Reviewed and stable  Last Vitals:  Vitals Value Taken Time  BP    Temp    Pulse 69 01/18/2019 10:47 AM  Resp 10 01/18/2019 10:47 AM  SpO2 100 % 01/18/2019 10:47 AM  Vitals shown include unvalidated device data.  Last Pain:  Vitals:   01/18/19 0807  TempSrc: Axillary         Complications: No apparent anesthesia complications

## 2019-01-19 LAB — CBC
HCT: 26.6 % — ABNORMAL LOW (ref 36.0–46.0)
Hemoglobin: 8.4 g/dL — ABNORMAL LOW (ref 12.0–15.0)
MCH: 23.9 pg — ABNORMAL LOW (ref 26.0–34.0)
MCHC: 31.6 g/dL (ref 30.0–36.0)
MCV: 75.8 fL — ABNORMAL LOW (ref 80.0–100.0)
Platelets: 163 10*3/uL (ref 150–400)
RBC: 3.51 MIL/uL — ABNORMAL LOW (ref 3.87–5.11)
RDW: 15.5 % (ref 11.5–15.5)
WBC: 10.4 10*3/uL (ref 4.0–10.5)
nRBC: 0 % (ref 0.0–0.2)

## 2019-01-19 LAB — CREATININE, SERUM
Creatinine, Ser: 0.68 mg/dL (ref 0.44–1.00)
GFR calc Af Amer: >60 mL/min (ref >60)
GFR calc non Af Amer: >60 mL/min (ref >60)

## 2019-01-19 LAB — BIRTH TISSUE RECOVERY COLLECTION (PLACENTA DONATION)

## 2019-01-19 MED ORDER — FERROUS SULFATE 325 (65 FE) MG PO TABS
325.0000 mg | ORAL_TABLET | Freq: Two times a day (BID) | ORAL | Status: DC
  Administered 2019-01-19 – 2019-01-20 (×2): 325 mg via ORAL
  Filled 2019-01-19 (×2): qty 1

## 2019-01-19 NOTE — Progress Notes (Signed)
POSTPARTUM PROGRESS NOTE  Post Operative Day 1  Subjective:  MINH HAGMAN is a 33 y.o. [id number] s/p rLTCS and BTL at [redacted]w[redacted]d.  No acute events overnight.  Pt denies problems with ambulating, voiding or po intake.  She denies nausea or vomiting.  Pain is moderately controlled.  She has not had flatus. She has not had bowel movement.  Lochia Small.   Objective: Blood pressure (!) 137/103, pulse 71, temperature 98.4 F (36.9 C), temperature source Oral, resp. rate 18, height 5\' 4"  (1.626 m), weight 84.8 kg, last menstrual period 06/11/2018, SpO2 100 %, unknown if currently breastfeeding.  Physical Exam:  General: alert, cooperative and no distress Chest: no respiratory distress Heart:regular rate, distal pulses intact Abdomen: soft, tender at umbilicus  Uterine Fundus: firm, appropriately tender DVT Evaluation: No calf swelling or tenderness Extremities: No edema Skin: warm, dry, pressure dressing intact   Recent Labs    01/18/19 0817 01/19/19 0612  HGB 10.8* 8.4*  HCT 33.7* 26.6*    Assessment/Plan: PAYZLEE CARBON is a 33 y.o. [id number] s/p rLTCS and BTL at [redacted]w[redacted]d   POD#1 - Doing well Contraception: BTL at time of delivery  Feeding: Formula Dispo: Plan for discharge tomorrow if stable.  #CHF: consider PP echo if needed, denies chest pain, SOB or edema at time of examination, Toprol 25mg  XL, follow up with cardio PP  #CHTN: monitor closely during PP period, will restart prepregnancy medication prior to discharge home    LOS: 1 day   Sharyon Cable, CNM 01/19/2019, 9:26 AM

## 2019-01-19 NOTE — Clinical Social Work Maternal (Signed)
CLINICAL SOCIAL WORK MATERNAL/CHILD NOTE  Patient Details  Name: Lisa Vazquez MRN: [id number] Date of Birth: April 29, 2021  Date:  09/22/2019  Clinical Social Worker Initiating Note:  Hortencia Pilar, LCSWA Date/Time: Initiated:  01/19/19/1445     Child's Name:  Lisa Vazquez   Biological Parents:  Mother, Father(Lisa Vazquez ([name]), Lisa Vazquez (FOB))   Need for Interpreter:  None   Reason for Referral:  Late or No Prenatal Care    Address:  [address] [place] [address]    Phone number:  [phone] (home)     Additional phone number: n/a  Household Members/Support Persons (HM/SP):   Household Member/Support Person 2, Household Member/Support Person 3, Household Member/Support Person 1   HM/SP Name Relationship DOB or Age  HM/SP -1   Lisa Vazquez(FOB)     HM/SP -2 Lisa Vazquez, Lisa Vazquez ([name])     HM/SP -3 Lisa Vazquez (Siblings)     HM/SP -4   Lisa Vazquez (Siblings)       HM/SP -5   Lisa Vazquez (Siblings)       HM/SP -6   Lisa Vazquez (Siblings)       HM/SP -7   Lisa Vazquez (sibling)       HM/SP -8          Natural Supports (not living in the home):  Extended Family, Investment banker, corporate Supports: None  Employment: Unemployed   Type of Work:   unemployed   Lisa:  Some Materials engineer arranged:  n/a  Surveyor, quantity Resources:  Medicaid   Other Resources:  Sales executive , WIC   Cultural/Religious Considerations Which May Impact Care:  none reported.  Strengths:  Ability to meet basic needs , Home prepared for child , Compliance with medical plan    Psychotropic Medications:    none      Pediatrician:     [facility]   Pediatrician List:   [facility]      Pediatrician Fax Number:    Risk Factors/Current Problems:  Other (Comment)(LPNC)   Cognitive State:  Linear Thinking , Able to Concentrate , Alert ,  Insightful    Mood/Affect:  Agitated , Blunted , Irritable , Calm , Flat    CSW Assessment: CSW consulted as [name] had limited PNC. Per OB records, [name] was seen at 15 weeks and not seen again until 36 weeks. CSW went to speak with [name] at bedside to discuss further needs.   Upon entering the room CSW observed that [name] was sitting in recliner feeding infant. CSW also observed that RN was in the room attending to infant and [name]. CSW introduced role and asked if it was okay for CSW to stay, [name] agreeable. CSW congratulated [name] on the birth of infant Lisa Vazquez). [name] thanked CSW. CSW informed [name] of the reason for visit. CSW was advised that per [name] she found out that she was pregnant later on in the pregnancy and theretofore receive limited care. [name] expressed that she had two other visits after her initial visit and then no further visits. CSW advised [name] that due to this infant would be being drug tested. CSW advised [name] of the hospital drug policy and explained to  [name] that if infant comes back positive for any substances that were not given to [name] by and MD, then CSW  would need to make a reports. [name] agreeable and expressed that she has had CPS involvement in the past but the case was closed quickly. [name] denies any substance abuse as well as mental health diagnosis. [name] also expressed being really tired but not SI or HI.   [name] expressed that she has 5 other children ranging in age 15-2. [name] has two 15 year olds (Lisa Vazquez and Lisa Vazquez), Lisa Vazquez (14), Lisa Vazquez (2), and infant. CSW was advised that all of these live together. [name] expressed that she did not suffer from PPD. CSW provided Lisa to [name] on SIDS as well as PPD. CSW provided [name] with Postpartum Progress Checklist. [name] declined further information on Safe Sleep habits, however CSW did discuss these with [name]. [name]   [name] currently unemployed with some college experience. CSW was advised that [name] gets Food Stamps but is not  applying for WIC until she figures out what type of formula is provided though WIC. CSW confirmed with [name] that [name] ha information to establish WIC if warranted. CSW was advised that once infant is home, infant will sleep in basinet. [name] declined any further needs at this time. CSW will continue to monitor infants CDS for further results.  CSW Plan/Description:  No Further Intervention Required/No Barriers to Discharge, Sudden Infant Death Syndrome (SIDS) Lisa, CSW Will Continue to Monitor Umbilical Cord Tissue Drug Screen Results and Make Report if Warranted, Hospital Drug Screen Policy Information, Other Patient/Family Lisa    Lisa Vazquez S Dorean Daniello, LCSWA 01/19/2019, 3:09 PM  

## 2019-01-19 NOTE — Anesthesia Postprocedure Evaluation (Signed)
Anesthesia Post Note  Patient: Lisa Vazquez  Procedure(s) Performed: CESAREAN SECTION WITH BILATERAL TUBAL LIGATION (Bilateral )     Patient location during evaluation: PACU Anesthesia Type: Spinal Level of consciousness: oriented and awake and alert Pain management: pain level controlled Vital Signs Assessment: post-procedure vital signs reviewed and stable Respiratory status: spontaneous breathing, respiratory function stable and patient connected to nasal cannula oxygen Cardiovascular status: blood pressure returned to baseline and stable Postop Assessment: no headache, no backache and no apparent nausea or vomiting Anesthetic complications: no    Last Vitals:  Vitals:   01/19/19 1100 01/19/19 1412  BP: (!) 153/106 122/85  Pulse:  72  Resp:  16  Temp:  37 C  SpO2:      Last Pain:  Vitals:   01/19/19 1510  TempSrc:   PainSc: 7    Pain Goal:                   Shannell Mikkelsen S

## 2019-01-20 ENCOUNTER — Inpatient Hospital Stay (HOSPITAL_COMMUNITY): Payer: Medicaid Other

## 2019-01-20 DIAGNOSIS — I361 Nonrheumatic tricuspid (valve) insufficiency: Secondary | ICD-10-CM

## 2019-01-20 DIAGNOSIS — I340 Nonrheumatic mitral (valve) insufficiency: Secondary | ICD-10-CM

## 2019-01-20 LAB — ECHOCARDIOGRAM COMPLETE
Height: 64 in
Weight: 2992 oz

## 2019-01-20 MED ORDER — SENNOSIDES-DOCUSATE SODIUM 8.6-50 MG PO TABS: 2.0000 | ORAL_TABLET | Freq: Two times a day (BID) | ORAL | 1 refills | Status: DC | PRN

## 2019-01-20 MED ORDER — ENALAPRIL MALEATE 10 MG PO TABS: 10.0000 mg | ORAL_TABLET | Freq: Every day | ORAL | 3 refills | Status: AC

## 2019-01-20 MED ORDER — METOPROLOL SUCCINATE ER 25 MG PO TB24: 25.0000 mg | ORAL_TABLET | Freq: Every day | ORAL | 3 refills | Status: AC

## 2019-01-20 MED ORDER — OXYCODONE-ACETAMINOPHEN 5-325 MG PO TABS: 1.0000 | ORAL_TABLET | ORAL | 0 refills | Status: DC | PRN

## 2019-01-20 MED ORDER — ENALAPRIL MALEATE 5 MG PO TABS
10.0000 mg | ORAL_TABLET | Freq: Every day | ORAL | Status: DC
  Administered 2019-01-20: 10 mg via ORAL
  Filled 2019-01-20: qty 2

## 2019-01-20 MED ORDER — FERROUS SULFATE 325 (65 FE) MG PO TABS: 325.0000 mg | ORAL_TABLET | Freq: Two times a day (BID) | ORAL | 3 refills | Status: DC

## 2019-01-20 NOTE — Progress Notes (Signed)
CSW witnessed [name] signing AMA forms for infant. Forms were provided to bedside nurse.   Blaine Hamper, MSW, LCSW Clinical Social Work [phone]

## 2019-01-20 NOTE — Progress Notes (Signed)
Faculty Practice OB/GYN Attending Note  Called Dr. Jacques Navy (Cardiology) and discussed patient's cardiac history.  She approved ordering of an ECHO given patient's history of postpartum dilated cardiomyopathy/CHF. No current symptoms, on Toprol and Enalapril.  ECHO ordered, RN and patient informed. Will await study results.  Appreciate the help of Dr. Jacques Navy in approving this study request.  Jaynie Collins, MD, FACOG Obstetrician & Gynecologist, [facility] for [facility], [facility]

## 2019-01-20 NOTE — Progress Notes (Signed)
CSW received consult due to score 16 on Edinburgh Depression Screen.  CSW met with [name] in room 419.    When CSW arrived, [name] was sitting on the bed and infant was laying on the bed.  CSW observed the infant being jittery and brought CSW's observation to [name]'s attention; [name] agreed with CSW's observation (CSW informed bedside nurse of infant's jitters during CSW assessment).   [name] informed CSW that she plans to leave in a "few minute."  CSW processed with [name] the necessity to have infant remain inpatient due to evidence of withdrawal symptoms.  CSW attempted to rational with [name] for approximately 20 minutes and was unsuccessful.  [name] agreed to contact CSW if [name] changes her mind.  CSW also reviewed [name]'s Edinburgh results and [name] reported, "I'm going to be honest with you; I did not really read the questions."  CSW brought question 10 to [name]'s attention and [name] denied having any thoughts of self-harm.  CSW assessed for safety and [name] denied SI, HI, and DV.  [name] reported having a good support team that consists of [name]'s mother, FOB, [name]'s older children and other immediate family members. [name] communicated feeling comfortable seeking help if help is needed.   CSW received a telephone call from bedside nurse.  Bedside nurse informed CSW of continued respiratory concerns for infant and [name]'s desire to leave AMA.  CSW made a CPS report to [place] CPS (Lisa Vazquez).  Lisa Vazquez, MSW, LCSW Clinical Social Work [phone] 

## 2019-01-20 NOTE — Progress Notes (Signed)
  Echocardiogram 2D Echocardiogram has been performed.  Tye Savoy 01/20/2019, 1:57 PM

## 2019-01-20 NOTE — Progress Notes (Signed)
Post Partum Day 2 Subjective: no complaints, up ad lib, voiding, tolerating PO and + flatus She would very much like to go home today Denies orthopnea, chest pain, PND  Objective: Blood pressure (!) 131/96, pulse 75, temperature 98.3 F (36.8 C), temperature source Oral, resp. rate 16, height 5\' 4"  (1.626 m), weight 84.8 kg, last menstrual period 06/11/2018, SpO2 98 %, unknown if currently breastfeeding.  Physical Exam:  General: alert, cooperative, appears stated age and no distress Lochia: appropriate Uterine Fundus: firm Incision: healing well, no significant drainage, no dehiscence, no significant erythema DVT Evaluation: No evidence of DVT seen on physical exam.  Recent Labs    01/18/19 0817 01/19/19 0612  HGB 10.8* 8.4*  HCT 33.7* 26.6*    Assessment/Plan: - Given history of PP cardiomyopathy and difficulty with getting patient into clinic during pregnancy, will discuss management with cardiology prior to discharge. May need echo while inpatient - Add enalapril 10mg  daily for additional BP control - Consider discharge later today    LOS: 2 days   Gwenevere Abbot 01/20/2019, 9:21 AM

## 2019-01-20 NOTE — Lactation Note (Signed)
This note was copied from a baby's chart. Lactation Consultation Note  Patient Name: Girl Betrice Passos [id number] Date: 01/20/2019  P5, 44 hour female infant, weight loss -1%. Infant receiving predominately formula. Per mom, she has been using DEBP now 5 times within past 24 hours to help with breast stimulation. She has made few attempts to latch baby to breast but infant just suckle few times.  Per mom, she will continue pumping and offering infant formula.    Maternal Data    Feeding Feeding Type: Bottle Fed - Formula Nipple Type: Slow - flow  LATCH Score                   Interventions    Lactation Tools Discussed/Used     Consult Status      Danelle Earthly 01/20/2019, 6:31 AM

## 2019-01-25 ENCOUNTER — Inpatient Hospital Stay (HOSPITAL_COMMUNITY): Admission: RE | Admit: 2019-01-25 | Payer: Medicaid Other | Source: Home / Self Care

## 2019-02-01 ENCOUNTER — Ambulatory Visit: Payer: Medicaid Other | Admitting: Obstetrics and Gynecology

## 2019-02-02 ENCOUNTER — Ambulatory Visit: Payer: Medicaid Other | Admitting: Cardiology

## 2019-02-08 ENCOUNTER — Telehealth: Payer: Self-pay | Admitting: Obstetrics and Gynecology

## 2019-02-11 ENCOUNTER — Other Ambulatory Visit: Payer: Self-pay

## 2019-02-11 ENCOUNTER — Telehealth (INDEPENDENT_AMBULATORY_CARE_PROVIDER_SITE_OTHER): Payer: Medicaid Other | Admitting: Cardiology

## 2019-02-11 NOTE — Progress Notes (Signed)
No Show

## 2019-02-17 ENCOUNTER — Ambulatory Visit: Payer: Medicaid Other | Admitting: Obstetrics & Gynecology

## 2019-02-18 ENCOUNTER — Other Ambulatory Visit: Payer: Self-pay

## 2019-02-18 ENCOUNTER — Ambulatory Visit (INDEPENDENT_AMBULATORY_CARE_PROVIDER_SITE_OTHER): Payer: Medicaid Other | Admitting: Obstetrics and Gynecology

## 2019-02-18 ENCOUNTER — Encounter: Payer: Self-pay | Admitting: Obstetrics and Gynecology

## 2019-02-18 DIAGNOSIS — F53 Postpartum depression: Secondary | ICD-10-CM

## 2019-02-18 DIAGNOSIS — B977 Papillomavirus as the cause of diseases classified elsewhere: Secondary | ICD-10-CM | POA: Insufficient documentation

## 2019-02-18 DIAGNOSIS — Z98891 History of uterine scar from previous surgery: Secondary | ICD-10-CM

## 2019-02-18 DIAGNOSIS — O903 Peripartum cardiomyopathy: Secondary | ICD-10-CM

## 2019-02-18 DIAGNOSIS — Z9889 Other specified postprocedural states: Secondary | ICD-10-CM

## 2019-02-18 DIAGNOSIS — Z3009 Encounter for other general counseling and advice on contraception: Secondary | ICD-10-CM

## 2019-02-18 NOTE — Progress Notes (Signed)
Obstetrics/Postpartum Visit  Appointment Date: 02/18/2019  OBGYN Clinic: [facility]  Primary Care Provider: Department, [facility]  Chief Complaint:  Chief Complaint  Patient presents with  . Postpartum Follow-up    History of Present Illness: Lisa Vazquez is a 33 y.o. African-American [id number] (Patient's last menstrual period was 06/11/2018 (approximate).), seen for the above chief complaint. 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  She is s/p repeat c-section with bilateral tubal ligation on 01/18/19 at 39 weeks; she was discharged to home on POD#2. Pregnancy complicated by congestive heart failure (2/2 post partum cardiomyopathy after delivery in 03/2017), hypertension.  Complains of abdominal pain near incision, incision doing well but has some pain right abdominal wall. Thinks she is depressed, most days does not want to get out of bed, but gets out of bed to take care of her kids, upset with herself for most things, crying a lot for no reason. Describes it as "no interest in anything outside of her house."  Vaginal bleeding or discharge: Yes , irregular spotting Breast or formula feeding: bottle Intercourse: No  Contraception: s/p BTL with CS PP depression s/s: Yes  Any bowel or bladder issues: No  Pap smear: high risk HPV, negative cytology (date: 09/2018)  Review of Systems: Positive for n/a.   Her 12 point review of systems is negative or as noted in the History of Present Illness.  Patient Active Problem List   Diagnosis Date Noted  . 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 Status post cesarean delivery 01/18/2019  . Cesarean delivery delivered 01/18/2019  . Unwanted fertility 12/23/2018  . History of preterm delivery, currently pregnant 09/30/2018  . Late prenatal care affecting pregnancy in second trimester 09/30/2018  . History of Postpartum Dilated  Cardiomyopathy  03/15/2017  . Essential hypertension 03/08/2017  . History of cesarean delivery 02/03/2017  . Marijuana use 07/26/2016  . High blood hemoglobin F (HCC) 07/26/2016  . CIN I (cervical intraepithelial neoplasia I) 04/06/2013  . 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 Ajello had no medications administered during this visit. Current Outpatient Medications  Medication Sig Dispense Refill  . enalapril (VASOTEC) 10 MG tablet Take 1 tablet (10 mg total) by mouth daily. 30 tablet 3  . metoprolol succinate (TOPROL XL) 25 MG 24 hr tablet Take 1 tablet (25 mg total) by mouth daily. 90 tablet 3  . oxyCODONE-acetaminophen (PERCOCET/ROXICET) 5-325 MG tablet Take 1 tablet by mouth every 4 (four) hours as needed for severe pain. 30 tablet 0  . ferrous sulfate 325 (65 FE) MG tablet Take 1 tablet (325 mg total) by mouth 2 (two) times daily with a meal. (Patient not taking: Reported on 02/18/2019) 60 tablet 3  . Multiple Vitamins-Minerals (IMMUNE SUPPORT PO) Take 2 each by mouth daily. [facility] immune boost gummies    . senna-docusate (SENOKOT-S) 8.6-50 MG tablet Take 2 tablets by mouth 2 (two) times daily as needed for mild constipation or moderate constipation. (Patient not taking: Reported on 02/18/2019) 30 tablet 1   No current facility-administered medications for this visit.    Allergies Patient has no known allergies.  Physical Exam:  BP (!) 134/91   Pulse 77   Wt 166 lb (75.3 kg)   LMP 06/11/2018 (Approximate)   Breastfeeding No   BMI 28.49 kg/m  Body mass index is 28.49  kg/m. General appearance: Well nourished, well developed female in no acute distress.  Cardiovascular: regular rate and rhythm Respiratory:  Clear to auscultation bilateral. Normal respiratory effort Abdomen: positive bowel sounds and no masses, hernias; diffusely non  tender to palpation, non distended, well healed pfannenstiel incision Breasts: not examined. Neuro/Psych:  Normal mood and affect.  Skin:  Warm and dry.   PP Depression Screening:   Edinburgh Postnatal Depression Scale - 02/18/19 1119      Edinburgh Postnatal Depression Scale:  In the Past 7 Days   I have been able to laugh and see the funny side of things.  0    I have looked forward with enjoyment to things.  2    I have blamed myself unnecessarily when things went wrong.  3    I have been anxious or worried for no good reason.  3    I have felt scared or panicky for no good reason.  2    Things have been getting on top of me.  3    I have been so unhappy that I have had difficulty sleeping.  3    I have felt sad or miserable.  3    I have been so unhappy that I have been crying.  3    The thought of harming myself has occurred to me.  0    Edinburgh Postnatal Depression Scale Total  22       Edinburgh Postnatal Depression Scale - 02/18/19 1119      Edinburgh Postnatal Depression Scale:  In the Past 7 Days   I have been able to laugh and see the funny side of things.  0    I have looked forward with enjoyment to things.  2    I have blamed myself unnecessarily when things went wrong.  3    I have been anxious or worried for no good reason.  3    I have felt scared or panicky for no good reason.  2    Things have been getting on top of me.  3    I have been so unhappy that I have had difficulty sleeping.  3    I have felt sad or miserable.  3    I have been so unhappy that I have been crying.  3    The thought of harming myself has occurred to me.  0    Edinburgh Postnatal Depression Scale Total  22      Assessment: Patient is a 33 y.o. [id number] who is 4 weeks post partum from a repeat c-section and bilateral tubal ligation. She is doing well except for some abdominal pain. Also with post partum depression, see below. No signs/symptoms of cardiac compromise.   Plan:   1.  Postpartum state  2. Post-operative state - Doing well, her abdomen is soft, non-tender and her pain is improving - reassured her this is normal healing  3. High risk HPV infection Needs colposcopy  4. History of cesarean delivery  5. Unwanted fertility S/p BTL  6. History of Postpartum Dilated Cardiomyopathy  - Saw cardiology 02/11/19, is on toprol and enalapril, patient verbalizes understanding of importance of following up with cardiology and presenting to hospital with cardiac/respiratory issues - From cards note "Low-dose Toprol.  Enalapril 10 mg was started on discharge given her EF of 25 to 30%.  In review of literature, enalapril may be used in breast-feeding.  Watch for any signs of infant hypotension.  Was doing very well prior to C-section.  NYHA class I.  Optimally, with 3 months of adequate heart failure treatment, it would be nice for us to check a repeat echocardiogram.  She may be a candidate for ICD in future."  7. Postpartum depression Patient depressed and very open about the fact. She strongly denies any suicidal/homicidal ideation. Offered BH services to which she is agreeable. Offered medication, however she is worried about side effects and having to wean off, will consider medication but [facility] before starting medication. Reviewed to call if she decides she would like to start medication, reviewed that she should present to ED with any thoughts of harm, she verbalizes understanding and states she will present to ED with any thoughts of harm to self or others.   RTC for colposcopy   K. Therese SarahMeryl , M.D. Attending [facility](Faculty Practice)

## 2019-02-18 NOTE — Progress Notes (Signed)
error 

## 2019-02-21 NOTE — BH Specialist Note (Signed)
Pt did not arrive to [facility] virtual visit, and did not answer the phone; left MyChart message and Left HIPPA-compliant message to call back [name] from [facility] [employer] [facility] at [phone].    Integrated Behavioral Health Initial Visit  MRN: [id number] Name: Lisa Vazquez [facility]

## 2019-02-22 ENCOUNTER — Ambulatory Visit: Payer: Medicaid Other | Admitting: Clinical

## 2019-02-22 ENCOUNTER — Other Ambulatory Visit: Payer: Self-pay

## 2019-03-15 ENCOUNTER — Other Ambulatory Visit: Payer: Self-pay

## 2019-03-15 ENCOUNTER — Ambulatory Visit: Payer: Medicaid Other | Admitting: Clinical

## 2019-03-15 ENCOUNTER — Telehealth: Payer: Self-pay | Admitting: Medical

## 2019-03-15 NOTE — BH Specialist Note (Signed)
Pt did not arrive to [facility] video visit and did not answer the phone. Voicemail is full, and cannot leave a phone message; left MyChart message for patient.   Integrated Behavioral Health Visit via Telemedicine (Telephone)  03/15/2019 Marcene Brawn [phone]   Garlan Fair

## 2019-03-15 NOTE — Telephone Encounter (Signed)
Sending the patient a missed appointment letter. °

## 2019-03-18 ENCOUNTER — Encounter: Payer: Medicaid Other | Admitting: Obstetrics and Gynecology

## 2019-04-07 ENCOUNTER — Encounter: Payer: Medicaid Other | Admitting: Obstetrics and Gynecology

## 2019-04-08 ENCOUNTER — Telehealth: Payer: Self-pay | Admitting: Obstetrics and Gynecology

## 2019-05-15 ENCOUNTER — Emergency Department (HOSPITAL_COMMUNITY)
Admission: EM | Admit: 2019-05-15 | Discharge: 2019-05-16 | Discharge status: Absent without leave | Payer: Medicaid Other

## 2019-05-15 ENCOUNTER — Other Ambulatory Visit: Payer: Self-pay

## 2019-07-14 ENCOUNTER — Other Ambulatory Visit: Payer: Self-pay

## 2019-07-14 DIAGNOSIS — Z20822 Contact with and (suspected) exposure to covid-19: Secondary | ICD-10-CM

## 2019-07-16 LAB — NOVEL CORONAVIRUS, NAA: SARS-CoV-2, NAA: NOT DETECTED

## 2019-09-04 ENCOUNTER — Encounter (HOSPITAL_COMMUNITY): Payer: Self-pay | Admitting: Obstetrics and Gynecology

## 2019-09-04 ENCOUNTER — Other Ambulatory Visit: Payer: Self-pay

## 2019-09-04 ENCOUNTER — Inpatient Hospital Stay (HOSPITAL_COMMUNITY)
Admission: EM | Admit: 2019-09-04 | Discharge: 2019-09-04 | DRG: 293 | Disposition: A | Discharge status: Home / Self Care | Payer: Medicaid Other | Attending: Family Medicine | Admitting: Family Medicine

## 2019-09-04 ENCOUNTER — Emergency Department (HOSPITAL_COMMUNITY): Payer: Medicaid Other

## 2019-09-04 DIAGNOSIS — Z8249 Family history of ischemic heart disease and other diseases of the circulatory system: Secondary | ICD-10-CM | POA: Diagnosis not present

## 2019-09-04 DIAGNOSIS — Z20828 Contact with and (suspected) exposure to other viral communicable diseases: Secondary | ICD-10-CM | POA: Diagnosis present

## 2019-09-04 DIAGNOSIS — F17210 Nicotine dependence, cigarettes, uncomplicated: Secondary | ICD-10-CM | POA: Diagnosis present

## 2019-09-04 DIAGNOSIS — I509 Heart failure, unspecified: Secondary | ICD-10-CM

## 2019-09-04 DIAGNOSIS — Z825 Family history of asthma and other chronic lower respiratory diseases: Secondary | ICD-10-CM | POA: Diagnosis not present

## 2019-09-04 DIAGNOSIS — Z79899 Other long term (current) drug therapy: Secondary | ICD-10-CM | POA: Diagnosis not present

## 2019-09-04 DIAGNOSIS — Z5329 Procedure and treatment not carried out because of patient's decision for other reasons: Secondary | ICD-10-CM | POA: Diagnosis present

## 2019-09-04 DIAGNOSIS — I110 Hypertensive heart disease with heart failure: Principal | ICD-10-CM | POA: Diagnosis present

## 2019-09-04 LAB — TROPONIN I (HIGH SENSITIVITY)
Troponin I (High Sensitivity): 41 ng/L — ABNORMAL HIGH (ref <18)
Troponin I (High Sensitivity): 38 ng/L — ABNORMAL HIGH (ref <18)

## 2019-09-04 LAB — I-STAT BETA HCG BLOOD, ED (MC, WL, AP ONLY): I-stat hCG, quantitative: <5 m[IU]/mL (ref <5)

## 2019-09-04 LAB — BRAIN NATRIURETIC PEPTIDE: B Natriuretic Peptide: 1064.8 pg/mL — ABNORMAL HIGH (ref 0.0–100.0)

## 2019-09-04 LAB — CBC WITH DIFFERENTIAL/PLATELET
Abs Immature Granulocytes: 0.02 10*3/uL (ref 0.00–0.07)
Basophils Absolute: 0 10*3/uL (ref 0.0–0.1)
Basophils Relative: 0 %
Eosinophils Absolute: 0.1 10*3/uL (ref 0.0–0.5)
Eosinophils Relative: 1 %
HCT: 33 % — ABNORMAL LOW (ref 36.0–46.0)
Hemoglobin: 10.3 g/dL — ABNORMAL LOW (ref 12.0–15.0)
Immature Granulocytes: 0 %
Lymphocytes Relative: 29 %
Lymphs Abs: 1.7 10*3/uL (ref 0.7–4.0)
MCH: 22.6 pg — ABNORMAL LOW (ref 26.0–34.0)
MCHC: 31.2 g/dL (ref 30.0–36.0)
MCV: 72.4 fL — ABNORMAL LOW (ref 80.0–100.0)
Monocytes Absolute: 0.7 10*3/uL (ref 0.1–1.0)
Monocytes Relative: 11 %
Neutro Abs: 3.4 10*3/uL (ref 1.7–7.7)
Neutrophils Relative %: 59 %
Platelets: 253 10*3/uL (ref 150–400)
RBC: 4.56 MIL/uL (ref 3.87–5.11)
RDW: 19.1 % — ABNORMAL HIGH (ref 11.5–15.5)
WBC: 5.9 10*3/uL (ref 4.0–10.5)
nRBC: 0 % (ref 0.0–0.2)

## 2019-09-04 LAB — COMPREHENSIVE METABOLIC PANEL
ALT: 16 U/L (ref 0–44)
AST: 22 U/L (ref 15–41)
Albumin: 3.7 g/dL (ref 3.5–5.0)
Alkaline Phosphatase: 57 U/L (ref 38–126)
Anion gap: 8 (ref 5–15)
BUN: 6 mg/dL (ref 6–20)
CO2: 29 mmol/L (ref 22–32)
Calcium: 9.2 mg/dL (ref 8.9–10.3)
Chloride: 103 mmol/L (ref 98–111)
Creatinine, Ser: 0.89 mg/dL (ref 0.44–1.00)
GFR calc Af Amer: >60 mL/min (ref >60)
GFR calc non Af Amer: >60 mL/min (ref >60)
Glucose, Bld: 103 mg/dL — ABNORMAL HIGH (ref 70–99)
Potassium: 3.6 mmol/L (ref 3.5–5.1)
Sodium: 140 mmol/L (ref 135–145)
Total Bilirubin: 0.6 mg/dL (ref 0.3–1.2)
Total Protein: 7 g/dL (ref 6.5–8.1)

## 2019-09-04 LAB — SARS CORONAVIRUS 2 (TAT 6-24 HRS): SARS Coronavirus 2: NEGATIVE

## 2019-09-04 LAB — MAGNESIUM: Magnesium: 2 mg/dL (ref 1.7–2.4)

## 2019-09-04 MED ORDER — FENTANYL CITRATE (PF) 100 MCG/2ML IJ SOLN
50.0000 ug | Freq: Once | INTRAMUSCULAR | Status: AC
  Administered 2019-09-04: 50 ug via INTRAVENOUS
  Filled 2019-09-04: qty 2

## 2019-09-04 MED ORDER — FUROSEMIDE 40 MG PO TABS: 40.0000 mg | ORAL_TABLET | Freq: Every day | ORAL | 0 refills | Status: DC

## 2019-09-04 MED ORDER — FUROSEMIDE 10 MG/ML IJ SOLN
40.0000 mg | Freq: Once | INTRAMUSCULAR | Status: AC
  Administered 2019-09-04: 14:00:00 40 mg via INTRAVENOUS
  Filled 2019-09-04: qty 4

## 2019-09-04 MED ORDER — POTASSIUM CHLORIDE CRYS ER 20 MEQ PO TBCR: 20.0000 meq | EXTENDED_RELEASE_TABLET | Freq: Every day | ORAL | 0 refills | Status: AC

## 2019-09-04 MED ORDER — POTASSIUM CHLORIDE CRYS ER 20 MEQ PO TBCR
40.0000 meq | EXTENDED_RELEASE_TABLET | Freq: Once | ORAL | Status: AC
  Administered 2019-09-04: 40 meq via ORAL
  Filled 2019-09-04: qty 2

## 2019-09-04 NOTE — Discharge Instructions (Addendum)
You were seen in the emergency department today for shortness of breath and leg swelling.  Your evaluation seems consistent with an exacerbation of your heart failure.  Your troponins, measure of heart function, and BNP, another measure of heart function both were somewhat elevated.  We discussed the option of admission and he would prefer to go home which we feel is reasonable.  We are sending you home with a few day course of a diuretic, otherwise known as a fluid pill, this medicine is called Lasix, please take this once per day for the next 3 days starting tomorrow as we have given you a dose in the ER IV today.  We are also sending you home with a potassium supplement as Lasix tends to drop your potassium.  We have prescribed you new medication(s) today. Discuss the medications prescribed today with your pharmacist as they can have adverse effects and interactions with your other medicines including over the counter and prescribed medications. Seek medical evaluation if you start to experience new or abnormal symptoms after taking one of these medicines, seek care immediately if you start to experience difficulty breathing, feeling of your throat closing, facial swelling, or rash as these could be indications of a more serious allergic reaction   Please try to monitor your fluid intake and output.  Please try to avoid salt.  See attached diet guidelines.  Please call your cardiology office first thing tomorrow morning to make them aware of your symptoms and to schedule a close follow-up within the next 1 to 2 days.  Return to the emergency department for new or worsening symptoms including but not limited to worsening trouble breathing, increased swelling, increased pain, passing out, fever, or any other concerns.

## 2019-09-04 NOTE — ED Triage Notes (Signed)
Patient reports she has CHF and is having increasing swelling and dyspnea walking. Patient reports she started having SOB yesterday and is got worse throughout the night.

## 2019-09-04 NOTE — ED Provider Notes (Addendum)
[facility]-EMERGENCY DEPT Provider Note   CSN: [id number] Arrival date & time: 09/04/19  2536     History Chief Complaint  Patient presents with  . Congestive Heart Failure    Lisa Vazquez is a 33 y.o. female with a history of CHF with last EF of 25-30% in April of 2020, postpartum dilated cardiomyopathy, HTN, & tobacco use who presents to the ED with complaints of leg swelling & dyspnea over the past 3 days. Patient reports bilateral lower extremity pain & swelling associated with dyspnea which is worse with exertion & the supine position. She also is having intermittent sharp sternal/L sided chest pain which lasts about 1 minute at a time prior to resolution without alleviating/aggravating factors. She states this feels like prior issues with her CHF. Her cardiologist is Dr. Toney Reil. She currently does not take a diuretic as she believes they said her CHF was better. She has had some congestion and cough as well. Denies fever, chills, nausea, vomiting, diaphoresis, syncope, hemoptysis, recent surgery/trauma, recent long travel, hormone use, personal hx of cancer, or hx of DVT/PE.    HPI     Past Medical History:  Diagnosis Date  . Acute CHF (HCC) 03/15/2017  . Atypical squamous cell changes of undetermined significance (ASCUS) on cervical cytology with positive high risk human papilloma virus (HPV) 03/17/2013   12/2012 pap smear ASCUS +HPV-  03/17/2013 colpo- Needs Colpo postpartum 2018  . Bacterial vaginal infection 04/06/2013  . Bacterial vaginosis 04/06/2013  . BV (bacterial vaginosis)   . CHF (congestive heart failure) (HCC)   . Chronic hypertension during pregnancy, antepartum 07/17/2016    Aspirin 81 mg daily after 12 weeks; discontinue after 36 weeks--Rx sent Current antihypertensives:  None   Baseline and surveillance labs (pulled in from [facility], refresh links as needed)  Lab Results Component Value Date  PLT 240 07/05/2016   CREATININE 0.74 07/17/2016  AST 11 07/17/2016  ALT 5 07/17/2016  Baseline PCR 347  Antenatal Testing CHTN - O10.919  Group I  BP < 140/90, no preeclampsia, AGA,  nml AFV, +/- meds    Group II BP > 140/90, on meds, no preeclampsia, AGA, nml AFV  20-28-34-38  20-24-28-32-35-38  32//2 x wk  28//BPP wkly then 32//2 x wk  40 no meds; 39 meds  PRN or 37 Pre-eclampsia  GHTN - O13.9/Preeclampsia without severe features  - O14.00   Preeclampsia with severe features - O14.10  Q 3-4wks  Q 2 wks  28//BPP wkly then 32//2 x wk  Inpatient  37  PRN or 34    . CIN I (cervical intraepithelial neoplasia I) 04/06/2013  . Essential hypertension 03/08/2017  . Gestational diabetes    gestational  . 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   . History of cesarean delivery 02/03/2017  . History of recurrent UTIs 07/24/2016   Need UTI suppression  Ask if she took Keflex 07/06/16. Culture Positive 07/17/16. Not she if this is Tx failure or failure to take ABX.   Patient did finally take ATB- she is now on suppression  . Hypertension   . Kidney infection   . Marijuana use 07/26/2016   Pos 07/11/16  . Postpartum care and examination 03/18/2017  . 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 Pregnancy induced hypertension   . Systolic and diastolic CHF, acute (HCC) 03/08/2017  . UTI (lower urinary tract infection)     Patient Active Problem List  Diagnosis Date Noted  . 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 Status post cesarean delivery 01/18/2019  . Cesarean delivery delivered 01/18/2019  . Unwanted fertility 12/23/2018  . History of preterm delivery, currently pregnant 09/30/2018  . Late prenatal care affecting pregnancy in second trimester 09/30/2018  . History of Postpartum Dilated Cardiomyopathy  03/15/2017  . Essential hypertension 03/08/2017  . History of cesarean delivery 02/03/2017  . Marijuana use 07/26/2016  . High blood hemoglobin F (HCC) 07/26/2016  . CIN I  (cervical intraepithelial neoplasia I) 04/06/2013  . Atypical squamous cell changes of undetermined significance (ASCUS) on cervical cytology with positive high risk human papilloma virus (HPV) 03/17/2013    Past Surgical History:  Procedure Laterality Date  . CESAREAN SECTION     x3  . CESAREAN SECTION WITH BILATERAL TUBAL LIGATION Bilateral 02/04/2017   pt denies having had tubal ligation; unable to delete  . CESAREAN SECTION WITH BILATERAL TUBAL LIGATION Bilateral 01/18/2019   Procedure: CESAREAN SECTION WITH BILATERAL TUBAL LIGATION;  Surgeon: Levie HeritageStinson, Jacob J, DO;  Location: [facility] LD ORS;  Service: Obstetrics;  Laterality: Bilateral;  . TOOTH EXTRACTION  06/2018     OB History    Gravida  6   Para  5   Term  3   Preterm  2   AB  1   Living  5     SAB      TAB  1   Ectopic  0   Multiple  0   Live Births  5           Family History  Problem Relation Age of Onset  . Hypertension Mother   . Miscarriages / [place] Mother   . Varicose Veins Mother   . Heart disease Father   . Hypertension Father   . Drug abuse Father   . Asthma Brother   . Hypertension Maternal Grandmother   . Hypertension Maternal Grandfather   . Heart disease Paternal Grandmother   . Hypertension Paternal Grandmother   . Cancer Paternal Grandmother   . Hypertension Paternal Grandfather   . Asthma Son   . ADD / ADHD Son   . Diabetes Paternal Aunt   . Heart disease Paternal Uncle     Social History   Tobacco Use  . Smoking status: Current Some Day Smoker    Packs/day: 0.25    Years: 7.00    Pack years: 1.75    Types: Cigarettes  . Smokeless tobacco: Former NeurosurgeonUser  . Tobacco comment: 3 cigs/day, has used vape pens  Substance Use Topics  . Alcohol use: No  . Drug use: No    Comment: Subutex    Home Medications Prior to Admission medications   Medication Sig Start Date End Date Taking? Authorizing Provider  enalapril (VASOTEC) 10 MG tablet Take 1 tablet (10 mg total) by  mouth daily. 01/21/19   Anyanwu, Jethro BastosUgonna A, MD  ferrous sulfate 325 (65 FE) MG tablet Take 1 tablet (325 mg total) by mouth 2 (two) times daily with a meal. Patient not taking: Reported on 02/18/2019 01/20/19   Anyanwu, Jethro BastosUgonna A, MD  metoprolol succinate (TOPROL XL) 25 MG 24 hr tablet Take 1 tablet (25 mg total) by mouth daily. 01/20/19   Anyanwu, Jethro BastosUgonna A, MD  Multiple Vitamins-Minerals (IMMUNE SUPPORT PO) Take 2 each by mouth daily. [facility] immune boost gummies    [provider]  oxyCODONE-acetaminophen (PERCOCET/ROXICET) 5-325 MG tablet Take 1 tablet by mouth every 4 (four) hours as needed for  severe pain. 01/20/19   Anyanwu, Sallyanne Havers, MD  senna-docusate (SENOKOT-S) 8.6-50 MG tablet Take 2 tablets by mouth 2 (two) times daily as needed for mild constipation or moderate constipation. Patient not taking: Reported on 02/18/2019 01/20/19   Osborne Oman, MD    Allergies    Patient has no known allergies.  Review of Systems   Review of Systems  Constitutional: Negative for chills, diaphoresis and fever.  HENT: Positive for congestion. Negative for ear pain and sore throat.   Respiratory: Positive for cough and shortness of breath.   Cardiovascular: Positive for chest pain and leg swelling.  Gastrointestinal: Negative for abdominal pain, nausea and vomiting.  Musculoskeletal: Positive for myalgias.  Neurological: Negative for dizziness, syncope and light-headedness.  All other systems reviewed and are negative.   Physical Exam Updated Vital Signs BP (!) 163/105 (BP Location: Left Arm)   Pulse 87   Temp 98.1 F (36.7 C) (Oral)   Resp 18   SpO2 97%   Physical Exam Vitals and nursing note reviewed.  Constitutional:      General: She is not in acute distress.    Appearance: She is well-developed. She is ill-appearing (mildly). She is not toxic-appearing.  HENT:     Head: Normocephalic and atraumatic.  Eyes:     General:        Right eye: No discharge.        Left eye: No  discharge.     Conjunctiva/sclera: Conjunctivae normal.  Cardiovascular:     Rate and Rhythm: Normal rate and regular rhythm.     Heart sounds: Murmur present.  Pulmonary:     Effort: Pulmonary effort is normal. No respiratory distress.     Breath sounds: Normal breath sounds. No wheezing, rhonchi or rales.  Abdominal:     General: There is no distension.     Palpations: Abdomen is soft.     Tenderness: There is no abdominal tenderness.  Musculoskeletal:     Cervical back: Neck supple.     Comments: 1+ symmetric pitting edema to the lower legs with tenderness to palpation. No overlying erythema/warmth.   Skin:    General: Skin is warm and dry.     Findings: No rash.  Neurological:     Mental Status: She is alert.     Comments: Clear speech. Sensation grossly intact to bilateral lower extremities. 5/5 strength with plantar/dorsiflexion bilaterally.   Psychiatric:        Behavior: Behavior normal.    ED Results / Procedures / Treatments   Labs (all labs ordered are listed, but only abnormal results are displayed) Labs Reviewed  CBC WITH DIFFERENTIAL/PLATELET - Abnormal; Notable for the following components:      Result Value   Hemoglobin 10.3 (*)    HCT 33.0 (*)    MCV 72.4 (*)    MCH 22.6 (*)    RDW 19.1 (*)    All other components within normal limits  COMPREHENSIVE METABOLIC PANEL - Abnormal; Notable for the following components:   Glucose, Bld 103 (*)    All other components within normal limits  BRAIN NATRIURETIC PEPTIDE - Abnormal; Notable for the following components:   B Natriuretic Peptide 1,064.8 (*)    All other components within normal limits  TROPONIN I (HIGH SENSITIVITY) - Abnormal; Notable for the following components:   Troponin I (High Sensitivity) 41 (*)    All other components within normal limits  MAGNESIUM  I-STAT BETA HCG BLOOD, ED ([facility], [facility], [facility]  ONLY)  TROPONIN I (HIGH SENSITIVITY)    EKG EKG Interpretation  Date/Time:  Sunday September 04 2019  09:35:58 EST Ventricular Rate:  75 PR Interval:    QRS Duration: 108 QT Interval:  473 QTC Calculation: 529 R Axis:   8 Text Interpretation: Sinus rhythm Probable left atrial enlargement Left ventricular hypertrophy Prolonged QT interval Baseline wander in lead(s) V1 Confirmed by Pickering, Nathan ([id number]) on 09/04/2019 10:40:20 AM   Radiology DG Chest Port 1 View  Result Date: 09/04/2019 CLINICAL DATA:  33 year old female with dyspnea EXAM: PORTABLE CHEST 1 VIEW COMPARISON:  01/18/2018 FINDINGS: Cardiomediastinal silhouette unchanged with cardiomegaly. No evidence of central vascular congestion. No interlobular septal thickening. No pneumothorax. No pleural effusion. No confluent airspace disease. No displaced fracture IMPRESSION: Negative for acute cardiopulmonary disease with persisting cardiomegaly Electronically Signed   By: Jaime  Wagner D.O.   On: 09/04/2019 10:25    Procedures Procedures (including critical care time)  Medications Ordered in ED Medications  furosemide (LASIX) injection 40 mg (has no administration in time range)  potassium chloride SA (KLOR-CON) CR tablet 40 mEq (has no administration in time range)  fentaNYL (SUBLIMAZE) injection 50 mcg (50 mcg Intravenous Given 09/04/19 1022)    ED Course/MDM  I have reviewed the triage vital signs and the nursing notes.  Pertinent labs & imaging results that were available during my care of the patient were reviewed by me and considered in my medical decision making (see chart for details).   Patient with hx of CHF secondary to perpartem dilated cardiomyopathy last EF 25-30% presents to the ED with complaints of progressively worsening LE edema & dyspnea as well as intermittent chest discomfort. Patient is nontoxic appearing, she is somewhat ill appearing though. BP elevated, maintaining SpO2 but does become quite dyspneic with ambulation throughout exam room. Lungs clear. 1+ symmetric pitting edema noted to lower legs. DDX:  CHF exacerbation, ACS, PE, pneumonia, critical anemia.  EKG: Sinus rhythm Probable left atrial enlargement Left ventricular hypertrophy Prolonged QT interval Baseline wander in lead(s) V1, given QT prolongation will check magnesium. No STEMI Preg test: Negative CBC: anemia improved from prior, no leukocytosis.  CMP: no significant electrolyte derangement. Renal function preserved.  Mag: WNL BNP elevated @ 1064.8 Troponin: Mildly elevated @ 41 CXR: cardiomegaly without acute change.   Overall concern for CHF exacerbation. Patient states she does not feel comfortable going home given her degree of discomfort and fatigue/DOE. Will administer IV lasix. Findings and plan of care discussed with supervising physician Dr. Pickering who has evaluated patient- recommends consultation to cardiology service to discuss admission which I am in agreement with.   12:51: CONSULT: Discussed with cardiologist Dr. Oneal - requesting hospitalist admission, cardiology service will consult.     13 :12: CONSULT: Discussed with hospitalist Dr. - accepts admission.   JASMON MATTICE was evaluated in Emergency Department on 09/04/2019 for the symptoms described in the history of present illness. He/she was evaluated in the context of the global COVID-19 pandemic, which necessitated consideration that the patient might be at risk for infection with the SARS-CoV-2 virus that causes COVID-19. Institutional protocols and algorithms that pertain to the evaluation of patients at risk for COVID-19 are in a state of rapid change based on information released by regulatory bodies including the CDC and federal and state organizations. These policies and algorithms were followed during the patient's care in the ED.   Final Clinical Impression(s) / ED Diagnoses Final diagnoses:  Acute on chronic congestive heart failure, unspecified  heart failure type [facility])    Rx / DC Orders ED Discharge Orders    None        Trilby Way, Pleas Koch, PA-C 09/04/19 1313  ADDENDUM:  Patient has changed her mind and would like to go home as her mother is planning to come stay with her and help her around the house as needed. Her troponin has mildly improved with repeat- no delta elevation, she is not hypoxic with ambulation and her renal function is preserved therefore feel this is reasonable. Per review of most recent cardiology note, patient did not appear to require lasix previously, could give as needed per their note therefore will discharge with 3 day course of lasix & potasium with close follow up with her cardiologist & strict return precautions. I discussed results, treatment plan, need for follow-up, and return precautions with the patient. Provided opportunity for questions, patient confirmed understanding and is in agreement with plan. Dr. Shon Hale aware of change in plans.   Findings and plan of care discussed with supervising physician Dr. Rubin Payor who is in agreement.   . Vitals:   09/04/19 1000 09/04/19 1247  BP: (!) 148/103 (!) 145/100  Pulse: 69 72  Resp: (!) 24 17  Temp:    SpO2: 100% 99%      Cherly Anderson, PA-C 09/04/19 1449    Benjiman Core, MD 09/04/19 1501

## 2019-09-04 NOTE — Progress Notes (Signed)
  EDP Kennith Maes , PA and  Dr. Alvino Chapel requested hospitalist input  chart reviewed,   -However notified by her RN ms Marlou Starks that patient does not desire to be admitted at this time - The Probation officer spoke with EDP Kennith Maes , PA and  Dr. Alvino Chapel -Case reviewed  --After further conversations with EDP Dr. Alvino Chapel --EDP spoke with patient --- decision was made to D/C patient home  --Patient was neither seen , nor evaluated by hospitalist service  Roxan Hockey, MD

## 2019-09-04 NOTE — ED Notes (Signed)
Patient expressed to nurse that she wishes to leave instead of being admitted. RN talked to admitting Dr. Joesph Fillers and [facility] PA-C. Will follow up with ER Dr. Alvino Chapel.

## 2019-09-06 ENCOUNTER — Encounter: Payer: Self-pay | Admitting: Cardiology

## 2019-09-06 NOTE — Telephone Encounter (Signed)
error 

## 2019-09-13 NOTE — Progress Notes (Deleted)
Cardiology Office Note   Date:  09/13/2019   ID:  Lisa, Vazquez 09-24-1985, MRN [id number]  PCP:  Department, [facility]  Cardiologist:  Dr. Marlou Porch    No chief complaint on file.     History of Present Illness: Lisa Vazquez is a 33 y.o. female who presents for ***   postpartum cardiomyopathy with prior EF of 25% in June 2018 here for evaluation prior to upcoming delivery.  Originally she was admitted on 03/08/2017 for heart failure 1 month postpartum with increasing shortness of breath and pressure in her chest.  CT scan of the chest was negative for PE but severe cardiomegaly was noted.  BNP was elevated at the time and T wave inversions were noted in the lateral leads.  Troponin was minimally elevated at 0.09.  She was last seen in our office by Kathyrn Drown, NP on 09/30/2018 at 23 weeks and she was nervous about her prior experience.  At the time no symptoms no shortness of breath no lower extremity swelling no chest pain.  She had been taking her carvedilol 3.125 mg twice a day.  This was changed to Toprol-XL 25 mg a day.  Risk of intrauterine growth restriction as well as neonatal bradycardia and hypoglycemia were previously discussed.  However given her cardiomyopathy, risks outweigh benefits.  Of course, she is not on an ACE inhibitor or angiotensin receptor blocker.  She was to have c section, she did not have follow up with cardiology. Did undergo C section.   Plan for BTL.  With prior pregnancy she did not have symptoms until 3 weeks post op.  Seen in ER 09/04/19 with CHF and DOE.  Also had sharp chest pain.  Lasts 1 min.   Not on diuretic.  CXR with persistent cardiomegaly no CHF.   Troponin 41 then 38   BNP 1064. Cr. 0.89 K+ 3.6 Na 140  Hgb 10.3 plts 253, d/c'd with 3 day course of lasix and k+. BP was 148/103 and resp 24.    Last echo 01/20/19 with c section  LV mild to moderately dilated.  MR is severe.  Discussed results with Dr. Margaretann Loveless  (Cardiology), she feels her anomalies is likely chronic in nature and patient is at high risk for ventricular arrhthymias. The patient does not have symptoms concerning for COVID-19 infection (fever, chills, cough, or new shortness of breath). Discharged on Metoprolol 25 mg XL and Enalapril 10 mg daily  Today ***   Past Medical History:  Diagnosis Date  . Acute CHF ([place]) 03/15/2017  . Atypical squamous cell changes of undetermined significance (ASCUS) on cervical cytology with positive high risk human papilloma virus (HPV) 03/17/2013   12/2012 pap smear ASCUS +HPV-  03/17/2013 colpo- [ ] Needs Colpo postpartum 2018  . Bacterial vaginal infection 04/06/2013  . Bacterial vaginosis 04/06/2013  . BV (bacterial vaginosis)   . CHF (congestive heart failure) ([place])   . Chronic hypertension during pregnancy, antepartum 07/17/2016   [x]  Aspirin 81 mg daily after 12 weeks; discontinue after 36 weeks--Rx sent Current antihypertensives:  None   Baseline and surveillance labs (pulled in from [facility], refresh links as needed)  Lab Results Component Value Date  PLT 240 07/05/2016  CREATININE 0.74 07/17/2016  AST 11 07/17/2016  ALT 5 07/17/2016  Baseline PCR 347  Antenatal Testing CHTN - O10.919  Group I  BP < 140/90, no preeclampsia, AGA,  nml AFV, +/- meds    Group II BP > 140/90, on meds, no  preeclampsia, AGA, nml AFV  20-28-34-38  20-24-28-32-35-38  32//2 x wk  28//BPP wkly then 32//2 x wk  40 no meds; 39 meds  PRN or 37 Pre-eclampsia  GHTN - O13.9/Preeclampsia without severe features  - O14.00   Preeclampsia with severe features - O14.10  Q 3-4wks  Q 2 wks  28//BPP wkly then 32//2 x wk  Inpatient  37  PRN or 34    . CIN I (cervical intraepithelial neoplasia I) 04/06/2013  . Essential hypertension 03/08/2017  . Gestational diabetes    gestational  . 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   . History of cesarean delivery 02/03/2017  . History of recurrent UTIs 07/24/2016   [  ]Need UTI suppression  [ ] Ask if she took Keflex 07/06/16. Culture Positive 07/17/16. Not she if this is Tx failure or failure to take ABX.   Patient did finally take ATB- she is now on suppression  . Hypertension   . Kidney infection   . Marijuana use 07/26/2016   Pos 07/11/16  . Postpartum care and examination 03/18/2017  . 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 Pregnancy induced hypertension   . Systolic and diastolic CHF, acute (HCC) 03/08/2017  . UTI (lower urinary tract infection)     Past Surgical History:  Procedure Laterality Date  . CESAREAN SECTION     x3  . CESAREAN SECTION WITH BILATERAL TUBAL LIGATION Bilateral 02/04/2017   pt denies having had tubal ligation; unable to delete  . CESAREAN SECTION WITH BILATERAL TUBAL LIGATION Bilateral 01/18/2019   Procedure: CESAREAN SECTION WITH BILATERAL TUBAL LIGATION;  Surgeon: 01/20/2019, DO;  Location: [facility] LD ORS;  Service: Obstetrics;  Laterality: Bilateral;  . TOOTH EXTRACTION  06/2018     Current Outpatient Medications  Medication Sig Dispense Refill  . acetaminophen (TYLENOL) 325 MG tablet Take 650-1,300 mg by mouth every 6 (six) hours as needed for mild pain or headache.    . enalapril (VASOTEC) 10 MG tablet Take 1 tablet (10 mg total) by mouth daily. 30 tablet 3  . furosemide (LASIX) 40 MG tablet Take 1 tablet (40 mg total) by mouth daily. 3 tablet 0  . metoprolol succinate (TOPROL XL) 25 MG 24 hr tablet Take 1 tablet (25 mg total) by mouth daily. 90 tablet 3  . potassium chloride SA (KLOR-CON) 20 MEQ tablet Take 1 tablet (20 mEq total) by mouth daily. 3 tablet 0   No current facility-administered medications for this visit.    Allergies:   Patient has no known allergies.    Social History:  The patient  reports that she has been smoking cigarettes. She has a 1.75 pack-year smoking history. She has quit using smokeless tobacco. She reports that she does not drink alcohol or use drugs.   Family History:  The  patient's ***family history includes ADD / ADHD in her son; Asthma in her brother and son; Cancer in her paternal grandmother; Diabetes in her paternal aunt; Drug abuse in her father; Heart disease in her father, paternal grandmother, and paternal uncle; Hypertension in her father, maternal grandfather, maternal grandmother, mother, paternal grandfather, and paternal grandmother; Miscarriages / 07/2018 in her mother; Varicose Veins in her mother.    ROS:  General:no colds or fevers, no weight changes Skin:no rashes or ulcers HEENT:no blurred vision, no congestion CV:see HPI PUL:see HPI GI:no diarrhea constipation or melena, no indigestion GU:no hematuria, no dysuria MS:no joint pain, no claudication Neuro:no syncope, no lightheadedness Endo:no diabetes,  no thyroid disease Wt Readings from Last 3 Encounters:  09/04/19 160 lb (72.6 kg)  02/18/19 166 lb (75.3 kg)  01/18/19 187 lb (84.8 kg)     PHYSICAL EXAM: VS:  There were no vitals taken for this visit. , BMI There is no height or weight on file to calculate BMI. General:Pleasant affect, NAD Skin:Warm and dry, brisk capillary refill HEENT:normocephalic, sclera clear, mucus membranes moist Neck:supple, no JVD, no bruits  Heart:S1S2 RRR without murmur, gallup, rub or click Lungs:clear without rales, rhonchi, or wheezes ZOX:WRUEAbd:soft, non tender, + BS, do not palpate liver spleen or masses Ext:no lower ext edema, 2+ pedal pulses, 2+ radial pulses Neuro:alert and oriented, MAE, follows commands, + facial symmetry    EKG:  EKG is ordered today. The ekg ordered today demonstrates ***   Recent Labs: 09/04/2019: ALT 16; B Natriuretic Peptide 1,064.8; BUN 6; Creatinine, Ser 0.89; Hemoglobin 10.3; Magnesium 2.0; Platelets 253; Potassium 3.6; Sodium 140    Lipid Panel    Component Value Date/Time   CHOL 154 04/06/2013 1151   TRIG 42 04/06/2013 1151   HDL 72 04/06/2013 1151   CHOLHDL 2.1 04/06/2013 1151   VLDL 8 04/06/2013 1151    LDLCALC 74 04/06/2013 1151       Other studies Reviewed: Additional studies/ records that were reviewed today include: ***.   ASSESSMENT AND PLAN:  1.  ***   Current medicines are reviewed with the patient today.  The patient Has no concerns regarding medicines.  The following changes have been made:  See above Labs/ tests ordered today include:see above  Disposition:   FU:  see above  Signed, Nada BoozerLaura Ademola Vert, NP  09/13/2019 9:44 PM    [facility] [address], [place], [place]  [address]/ [address] [place], [place] Phone: [phone]; Fax: [phone]  [phone]

## 2019-09-14 ENCOUNTER — Ambulatory Visit: Payer: Medicaid Other | Admitting: Cardiology

## 2019-10-04 NOTE — Progress Notes (Deleted)
Cardiology Office Note   Date:  10/04/2019   ID:  Lisa Vazquez, DOB [birth year], MRN [id number]  PCP:  Department, [facility]  Cardiologist:  Dr. Anne Fu    No chief complaint on file.     History of Present Illness: Lisa Vazquez is a 34 y.o. 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  Originally she was admitted on 03/08/2017 for heart failure 1 month postpartum with increasing shortness of breath and pressure in her chest. CT scan of the chest was negative for PE but severe cardiomegaly was noted. BNP was elevated at the time and T wave inversions were noted in the lateral leads. Troponin was minimally elevated at 0.09.  She was last seen in our office by Georgie Chard, NP on 09/30/2018 at 23 weeks and she was nervous about her prior experience. At the time no symptoms no shortness of breath no lower extremity swelling no chest pain. She had been taking her carvedilol 3.125 mg twice a day. This was changed to Toprol-XL 25 mg a day. Risk of intrauterine growth restriction as well as neonatal bradycardia and hypoglycemia were previously discussed. However given her cardiomyopathy, risks outweigh benefits. Of course, she is not on an ACE inhibitor or angiotensin receptor blocker.  She was to have c section, she did not have follow up with cardiology. Did undergo C section.   Plan for BTL.  With prior pregnancy she did not have symptoms until 3 weeks post op.  Seen in ER 09/04/19 with CHF and DOE.  Also had sharp chest pain.  Lasts 1 min.   Not on diuretic.  CXR with persistent cardiomegaly no CHF.   Troponin 41 then 38   BNP 1064. Cr. 0.89 K+ 3.6 Na 140  Hgb 10.3 plts 253, d/c'd with 3 day course of lasix and k+. BP was 148/103 and resp 24.    Last echo 01/20/19 with c section  LV mild to moderately dilated.  MR is severe.  Discussed results with Dr. Jacques Navy  (Cardiology), she feels her anomalies is likely chronic in nature and patient is at high risk for ventricular arrhthymias. The patientdoes nothave symptoms concerning for COVID-19 infection (fever, chills, cough, or new shortness of breath). Discharged onMetoprolol 25 mg [place] Enalapril 10 mg daily  Today ***   Past Medical History:  Diagnosis Date  . Acute CHF (HCC) 03/15/2017  . Atypical squamous cell changes of undetermined significance (ASCUS) on cervical cytology with positive high risk human papilloma virus (HPV) 03/17/2013   12/2012 pap smear ASCUS +HPV-  03/17/2013 colpo- [ ] Needs Colpo postpartum 2018  . Bacterial vaginal infection 04/06/2013  . Bacterial vaginosis 04/06/2013  . BV (bacterial vaginosis)   . CHF (congestive heart failure) (HCC)   . Chronic hypertension during pregnancy, antepartum 07/17/2016   [x]  Aspirin 81 mg daily after 12 weeks; discontinue after 36 weeks--Rx sent Current antihypertensives:  None   Baseline and surveillance labs (pulled in from [facility], refresh links as needed)  Lab Results Component Value Date  PLT 240 07/05/2016  CREATININE 0.74 07/17/2016  AST 11 07/17/2016  ALT 5 07/17/2016  Baseline PCR 347  Antenatal Testing CHTN - O10.919  Group I  BP < 140/90, no preeclampsia, AGA,  nml AFV, +/- meds    Group II BP > 140/90, on meds, no preeclampsia, AGA, nml AFV  20-28-34-38  20-24-28-32-35-38  32//2 x wk  28//BPP  wkly then 32//2 x wk  40 no meds; 39 meds  PRN or 37 Pre-eclampsia  GHTN - O13.9/Preeclampsia without severe features  - O14.00   Preeclampsia with severe features - O14.10  Q 3-4wks  Q 2 wks  28//BPP wkly then 32//2 x wk  Inpatient  37  PRN or 34    . CIN I (cervical intraepithelial neoplasia I) 04/06/2013  . Essential hypertension 03/08/2017  . Gestational diabetes    gestational  . High blood hemoglobin F ([place]) 07/26/2016   Not usually clinically significant.   . History of cesarean delivery 02/03/2017  . History of recurrent UTIs 07/24/2016   [  ]Need UTI suppression  [ ] Ask if she took Keflex 07/06/16. Culture Positive 07/17/16. Not she if this is Tx failure or failure to take ABX.   Patient did finally take ATB- she is now on suppression  . Hypertension   . Kidney infection   . Marijuana use 07/26/2016   Pos 07/11/16  . Postpartum care and examination 03/18/2017  . 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 Pregnancy induced hypertension   . Systolic and diastolic CHF, acute ([place]) 03/08/2017  . UTI (lower urinary tract infection)     Past Surgical History:  Procedure Laterality Date  . CESAREAN SECTION     x3  . CESAREAN SECTION WITH BILATERAL TUBAL LIGATION Bilateral 02/04/2017   pt denies having had tubal ligation; unable to delete  . CESAREAN SECTION WITH BILATERAL TUBAL LIGATION Bilateral 01/18/2019   Procedure: CESAREAN SECTION WITH BILATERAL TUBAL LIGATION;  Surgeon: Truett Mainland, DO;  Location: [place] LD ORS;  Service: Obstetrics;  Laterality: Bilateral;  . TOOTH EXTRACTION  06/2018     Current Outpatient Medications  Medication Sig Dispense Refill  . acetaminophen (TYLENOL) 325 MG tablet Take 650-1,300 mg by mouth every 6 (six) hours as needed for mild pain or headache.    . enalapril (VASOTEC) 10 MG tablet Take 1 tablet (10 mg total) by mouth daily. 30 tablet 3  . furosemide (LASIX) 40 MG tablet Take 1 tablet (40 mg total) by mouth daily. 3 tablet 0  . metoprolol succinate (TOPROL XL) 25 MG 24 hr tablet Take 1 tablet (25 mg total) by mouth daily. 90 tablet 3  . potassium chloride SA (KLOR-CON) 20 MEQ tablet Take 1 tablet (20 mEq total) by mouth daily. 3 tablet 0   No current facility-administered medications for this visit.    Allergies:   Patient has no known allergies.    Social History:  The patient  reports that she has been smoking cigarettes. She has a 1.75 pack-year smoking history. She has quit using smokeless tobacco. She reports that she does not drink alcohol or use drugs.   Family History:  The  patient's ***family history includes ADD / ADHD in her son; Asthma in her brother and son; Cancer in her paternal grandmother; Diabetes in her paternal aunt; Drug abuse in her father; Heart disease in her father, paternal grandmother, and paternal uncle; Hypertension in her father, maternal grandfather, maternal grandmother, mother, paternal grandfather, and paternal grandmother; Miscarriages / [place] in her mother; Varicose Veins in her mother.    ROS:  General:no colds or fevers, no weight changes Skin:no rashes or ulcers HEENT:no blurred vision, no congestion CV:see HPI PUL:see HPI GI:no diarrhea constipation or melena, no indigestion GU:no hematuria, no dysuria MS:no joint pain, no claudication Neuro:no syncope, no lightheadedness Endo:no diabetes, no thyroid disease Wt Readings from Last 3 Encounters:  09/04/19 160 lb (72.6  kg)  02/18/19 166 lb (75.3 kg)  01/18/19 187 lb (84.8 kg)     PHYSICAL EXAM: VS:  There were no vitals taken for this visit. , BMI There is no height or weight on file to calculate BMI. General:Pleasant affect, NAD Skin:Warm and dry, brisk capillary refill HEENT:normocephalic, sclera clear, mucus membranes moist Neck:supple, no JVD, no bruits  Heart:S1S2 RRR without murmur, gallup, rub or click Lungs:clear without rales, rhonchi, or wheezes XAJ:OINO, non tender, + BS, do not palpate liver spleen or masses Ext:no lower ext edema, 2+ pedal pulses, 2+ radial pulses Neuro:alert and oriented, MAE, follows commands, + facial symmetry    EKG:  EKG is ordered today. The ekg ordered today demonstrates ***   Recent Labs: 09/04/2019: ALT 16; B Natriuretic Peptide 1,064.8; BUN 6; Creatinine, Ser 0.89; Hemoglobin 10.3; Magnesium 2.0; Platelets 253; Potassium 3.6; Sodium 140    Lipid Panel    Component Value Date/Time   CHOL 154 04/06/2013 1151   TRIG 42 04/06/2013 1151   HDL 72 04/06/2013 1151   CHOLHDL 2.1 04/06/2013 1151   VLDL 8 04/06/2013 1151    LDLCALC 74 04/06/2013 1151       Other studies Reviewed: Additional studies/ records that were reviewed today include: ***. IMPRESSIONS    1. Severe akinesis of the left ventricular, basal inferior wall.  2. The left ventricle has severely reduced systolic function, with an ejection fraction of 25-30%. The cavity size was mild to moderately dilated. Left ventricular diastolic Doppler parameters are consistent with pseudonormalization.  3. The right ventricle has normal systolic function. The cavity was normal. There is no increase in right ventricular wall thickness.  4. Mitral valve regurgitation is severe by color flow Doppler.  5. The tricuspid valve is grossly normal.  6. The aortic root and ascending aorta are normal in size and structure.  7. The interatrial septum was not assessed.  FINDINGS  Left Ventricle: The left ventricle has severely reduced systolic function, with an ejection fraction of 25-30%. The cavity size was mild to moderately dilated. There is no increase in left ventricular wall thickness. Left ventricular diastolic Doppler  parameters are consistent with pseudonormalization. Severe akinesis of the left ventricular, basal inferior wall.  Right Ventricle: The right ventricle has normal systolic function. The cavity was normal. There is no increase in right ventricular wall thickness.  Left Atrium: Left atrial size was normal in size.  Right Atrium: Right atrial size was normal in size. Right atrial pressure is estimated at 10 mmHg.  Interatrial Septum: The interatrial septum was not assessed.  Pericardium: There is no evidence of pericardial effusion.  Mitral Valve: The mitral valve is normal in structure. Mitral valve regurgitation is severe by color flow Doppler.  Tricuspid Valve: The tricuspid valve is grossly normal. Tricuspid valve regurgitation is mild by color flow Doppler.  Aortic Valve: The aortic valve is normal in structure. Aortic valve  regurgitation was not visualized by color flow Doppler.  Pulmonic Valve: The pulmonic valve was not assessed. Pulmonic valve regurgitation is not visualized by color flow Doppler.  Aorta: The aortic root and ascending aorta are normal in size and structure.  Venous: The inferior vena cava is normal in size with greater than 50% respiratory variability.  ASSESSMENT AND PLAN:  1.  ***   Current medicines are reviewed with the patient today.  The patient Has no concerns regarding medicines.  The following changes have been made:  See above Labs/ tests ordered today include:see above  Disposition:  FU:  see above  Signed, Nada Boozer, NP  10/04/2019 8:37 PM    [facility] [address] [place], [place], [place]  [address]/ [address] [place], [place] Phone: [phone]; Fax: [phone]  [phone]

## 2019-10-05 ENCOUNTER — Ambulatory Visit: Payer: Medicaid Other | Admitting: Cardiology

## 2019-11-21 ENCOUNTER — Emergency Department (HOSPITAL_COMMUNITY): Payer: Medicaid Other

## 2019-11-21 ENCOUNTER — Other Ambulatory Visit: Payer: Self-pay

## 2019-11-21 ENCOUNTER — Encounter (HOSPITAL_COMMUNITY): Payer: Self-pay

## 2019-11-21 ENCOUNTER — Observation Stay (HOSPITAL_COMMUNITY)
Admission: EM | Admit: 2019-11-21 | Discharge: 2019-11-21 | Discharge status: Against Medical Advice | Payer: Medicaid Other | Attending: Family Medicine | Admitting: Family Medicine

## 2019-11-21 DIAGNOSIS — I5043 Acute on chronic combined systolic (congestive) and diastolic (congestive) heart failure: Secondary | ICD-10-CM | POA: Insufficient documentation

## 2019-11-21 DIAGNOSIS — F17210 Nicotine dependence, cigarettes, uncomplicated: Secondary | ICD-10-CM | POA: Insufficient documentation

## 2019-11-21 DIAGNOSIS — Z809 Family history of malignant neoplasm, unspecified: Secondary | ICD-10-CM | POA: Insufficient documentation

## 2019-11-21 DIAGNOSIS — Z833 Family history of diabetes mellitus: Secondary | ICD-10-CM | POA: Diagnosis not present

## 2019-11-21 DIAGNOSIS — Z8489 Family history of other specified conditions: Secondary | ICD-10-CM | POA: Insufficient documentation

## 2019-11-21 DIAGNOSIS — Z20822 Contact with and (suspected) exposure to covid-19: Secondary | ICD-10-CM | POA: Diagnosis not present

## 2019-11-21 DIAGNOSIS — R0600 Dyspnea, unspecified: Principal | ICD-10-CM | POA: Insufficient documentation

## 2019-11-21 DIAGNOSIS — Z808 Family history of malignant neoplasm of other organs or systems: Secondary | ICD-10-CM | POA: Insufficient documentation

## 2019-11-21 DIAGNOSIS — D509 Iron deficiency anemia, unspecified: Secondary | ICD-10-CM | POA: Diagnosis not present

## 2019-11-21 DIAGNOSIS — I10 Essential (primary) hypertension: Secondary | ICD-10-CM | POA: Diagnosis present

## 2019-11-21 DIAGNOSIS — Z825 Family history of asthma and other chronic lower respiratory diseases: Secondary | ICD-10-CM | POA: Diagnosis not present

## 2019-11-21 DIAGNOSIS — I5023 Acute on chronic systolic (congestive) heart failure: Secondary | ICD-10-CM

## 2019-11-21 DIAGNOSIS — Z8249 Family history of ischemic heart disease and other diseases of the circulatory system: Secondary | ICD-10-CM | POA: Diagnosis not present

## 2019-11-21 DIAGNOSIS — O903 Peripartum cardiomyopathy: Secondary | ICD-10-CM | POA: Insufficient documentation

## 2019-11-21 DIAGNOSIS — Z8632 Personal history of gestational diabetes: Secondary | ICD-10-CM | POA: Insufficient documentation

## 2019-11-21 DIAGNOSIS — R0989 Other specified symptoms and signs involving the circulatory and respiratory systems: Secondary | ICD-10-CM | POA: Insufficient documentation

## 2019-11-21 DIAGNOSIS — Z79899 Other long term (current) drug therapy: Secondary | ICD-10-CM | POA: Diagnosis not present

## 2019-11-21 DIAGNOSIS — M7989 Other specified soft tissue disorders: Secondary | ICD-10-CM | POA: Insufficient documentation

## 2019-11-21 DIAGNOSIS — J810 Acute pulmonary edema: Secondary | ICD-10-CM

## 2019-11-21 DIAGNOSIS — I110 Hypertensive heart disease with heart failure: Secondary | ICD-10-CM | POA: Insufficient documentation

## 2019-11-21 DIAGNOSIS — R601 Generalized edema: Secondary | ICD-10-CM

## 2019-11-21 LAB — CBC
HCT: 33.4 % — ABNORMAL LOW (ref 36.0–46.0)
Hemoglobin: 10.6 g/dL — ABNORMAL LOW (ref 12.0–15.0)
MCH: 22.7 pg — ABNORMAL LOW (ref 26.0–34.0)
MCHC: 31.7 g/dL (ref 30.0–36.0)
MCV: 71.7 fL — ABNORMAL LOW (ref 80.0–100.0)
Platelets: 207 10*3/uL (ref 150–400)
RBC: 4.66 MIL/uL (ref 3.87–5.11)
RDW: 20 % — ABNORMAL HIGH (ref 11.5–15.5)
WBC: 7.1 10*3/uL (ref 4.0–10.5)
nRBC: 0 % (ref 0.0–0.2)

## 2019-11-21 LAB — I-STAT BETA HCG BLOOD, ED (MC, WL, AP ONLY): I-stat hCG, quantitative: <5 m[IU]/mL (ref <5)

## 2019-11-21 LAB — BASIC METABOLIC PANEL
Anion gap: 5 (ref 5–15)
BUN: 11 mg/dL (ref 6–20)
CO2: 29 mmol/L (ref 22–32)
Calcium: 8.9 mg/dL (ref 8.9–10.3)
Chloride: 103 mmol/L (ref 98–111)
Creatinine, Ser: 0.91 mg/dL (ref 0.44–1.00)
GFR calc Af Amer: >60 mL/min (ref >60)
GFR calc non Af Amer: >60 mL/min (ref >60)
Glucose, Bld: 94 mg/dL (ref 70–99)
Potassium: 3.8 mmol/L (ref 3.5–5.1)
Sodium: 137 mmol/L (ref 135–145)

## 2019-11-21 LAB — TROPONIN I (HIGH SENSITIVITY)
Troponin I (High Sensitivity): 37 ng/L — ABNORMAL HIGH (ref <18)
Troponin I (High Sensitivity): 35 ng/L — ABNORMAL HIGH (ref <18)

## 2019-11-21 LAB — BRAIN NATRIURETIC PEPTIDE: B Natriuretic Peptide: 181.4 pg/mL — ABNORMAL HIGH (ref 0.0–100.0)

## 2019-11-21 LAB — SARS CORONAVIRUS 2 (TAT 6-24 HRS): SARS Coronavirus 2: NEGATIVE

## 2019-11-21 MED ORDER — ACETAMINOPHEN 500 MG PO TABS
1000.0000 mg | ORAL_TABLET | Freq: Once | ORAL | Status: AC
  Administered 2019-11-21: 04:00:00 1000 mg via ORAL
  Filled 2019-11-21: qty 2

## 2019-11-21 MED ORDER — SODIUM CHLORIDE 0.9% FLUSH
3.0000 mL | Freq: Once | INTRAVENOUS | Status: AC
  Administered 2019-11-21: 3 mL via INTRAVENOUS

## 2019-11-21 MED ORDER — FUROSEMIDE 40 MG PO TABS: 40.0000 mg | ORAL_TABLET | Freq: Every day | ORAL | 0 refills | Status: AC

## 2019-11-21 MED ORDER — FUROSEMIDE 10 MG/ML IJ SOLN
40.0000 mg | Freq: Once | INTRAMUSCULAR | Status: AC
  Administered 2019-11-21: 40 mg via INTRAVENOUS
  Filled 2019-11-21: qty 4

## 2019-11-21 NOTE — ED Provider Notes (Signed)
[facility] DEPT Provider Note   CSN: [id number] Arrival date & time: 11/21/19  0036     History Chief Complaint  Patient presents with  . Leg Swelling    Lisa Vazquez is a 34 y.o. female.  The history is provided by the patient.  Shortness of Breath Severity:  Moderate Onset quality:  Gradual Duration:  1 week Timing:  Constant Progression:  Worsening Chronicity:  Recurrent Context: not fumes   Context comment:  Patient has post partum cardiomyopathy with EF of 25% and previous history of CHF.  Denies any missed dosages of medication Relieved by:  Nothing Worsened by:  Nothing Ineffective treatments:  None tried Associated symptoms: no chest pain, no cough, no fever, no neck pain and no syncope   Associated symptoms comment:  Worsening peripheral edema  Risk factors comment:  EF 25% Patient with post partum cardiomyopathy and h/o CHF presents with progressive SOB and CP today.       Past Medical History:  Diagnosis Date  . Acute CHF ([place]) 03/15/2017  . Atypical squamous cell changes of undetermined significance (ASCUS) on cervical cytology with positive high risk human papilloma virus (HPV) 03/17/2013   12/2012 pap smear ASCUS +HPV-  03/17/2013 colpo- [ ] Needs Colpo postpartum 2018  . Bacterial vaginal infection 04/06/2013  . Bacterial vaginosis 04/06/2013  . BV (bacterial vaginosis)   . CHF (congestive heart failure) ([place])   . Chronic hypertension during pregnancy, antepartum 07/17/2016   [x]  Aspirin 81 mg daily after 12 weeks; discontinue after 36 weeks--Rx sent Current antihypertensives:  None   Baseline and surveillance labs (pulled in from [facility], refresh links as needed)  Lab Results Component Value Date  PLT 240 07/05/2016  CREATININE 0.74 07/17/2016  AST 11 07/17/2016  ALT 5 07/17/2016  Baseline PCR 347  Antenatal Testing CHTN - O10.919  Group I  BP < 140/90, no preeclampsia, AGA,  nml AFV, +/- meds    Group II BP > 140/90, on meds,  no preeclampsia, AGA, nml AFV  20-28-34-38  20-24-28-32-35-38  32//2 x wk  28//BPP wkly then 32//2 x wk  40 no meds; 39 meds  PRN or 37 Pre-eclampsia  GHTN - O13.9/Preeclampsia without severe features  - O14.00   Preeclampsia with severe features - O14.10  Q 3-4wks  Q 2 wks  28//BPP wkly then 32//2 x wk  Inpatient  37  PRN or 34    . CIN I (cervical intraepithelial neoplasia I) 04/06/2013  . Essential hypertension 03/08/2017  . Gestational diabetes    gestational  . High blood hemoglobin F ([place]) 07/26/2016   Not usually clinically significant.   . History of cesarean delivery 02/03/2017  . History of recurrent UTIs 07/24/2016   [ ] Need UTI suppression  [ ] Ask if she took Keflex 07/06/16. Culture Positive 07/17/16. Not she if this is Tx failure or failure to take ABX.   Patient did finally take ATB- she is now on suppression  . Hypertension   . Kidney infection   . Marijuana use 07/26/2016   Pos 07/11/16  . Postpartum care and examination 03/18/2017  . 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 Pregnancy induced hypertension   . Systolic and diastolic CHF, acute ([place]) 03/08/2017  . UTI (lower urinary tract infection)     Patient Active Problem List   Diagnosis Date Noted  . Acute exacerbation of CHF (congestive heart failure) ([place]) 09/04/2019  . 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 Status post cesarean delivery 01/18/2019  .  Cesarean delivery delivered 01/18/2019  . Unwanted fertility 12/23/2018  . History of preterm delivery, currently pregnant 09/30/2018  . Late prenatal care affecting pregnancy in second trimester 09/30/2018  . History of Postpartum Dilated Cardiomyopathy  03/15/2017  . Essential hypertension 03/08/2017  . History of cesarean delivery 02/03/2017  . Marijuana use 07/26/2016  . High blood hemoglobin F (HCC) 07/26/2016  . CIN I (cervical intraepithelial neoplasia I) 04/06/2013  . Atypical squamous cell changes of undetermined significance (ASCUS) on cervical cytology  with positive high risk human papilloma virus (HPV) 03/17/2013    Past Surgical History:  Procedure Laterality Date  . CESAREAN SECTION     x3  . CESAREAN SECTION WITH BILATERAL TUBAL LIGATION Bilateral 02/04/2017   pt denies having had tubal ligation; unable to delete  . CESAREAN SECTION WITH BILATERAL TUBAL LIGATION Bilateral 01/18/2019   Procedure: CESAREAN SECTION WITH BILATERAL TUBAL LIGATION;  Surgeon: Levie Heritage, DO;  Location: [facility] LD ORS;  Service: Obstetrics;  Laterality: Bilateral;  . TOOTH EXTRACTION  06/2018     OB History    Gravida  6   Para  5   Term  3   Preterm  2   AB  1   Living  5     SAB      TAB  1   Ectopic  0   Multiple  0   Live Births  5           Family History  Problem Relation Age of Onset  . Hypertension Mother   . Miscarriages / [place] Mother   . Varicose Veins Mother   . Heart disease Father   . Hypertension Father   . Drug abuse Father   . Asthma Brother   . Hypertension Maternal Grandmother   . Hypertension Maternal Grandfather   . Heart disease Paternal Grandmother   . Hypertension Paternal Grandmother   . Cancer Paternal Grandmother   . Hypertension Paternal Grandfather   . Asthma Son   . ADD / ADHD Son   . Diabetes Paternal Aunt   . Heart disease Paternal Uncle     Social History   Tobacco Use  . Smoking status: Current Some Day Smoker    Packs/day: 0.25    Years: 7.00    Pack years: 1.75    Types: Cigarettes  . Smokeless tobacco: Former Neurosurgeon  . Tobacco comment: 3 cigs/day, has used vape pens  Substance Use Topics  . Alcohol use: No  . Drug use: No    Comment: Subutex    Home Medications Prior to Admission medications   Medication Sig Start Date End Date Taking? Authorizing Provider  enalapril (VASOTEC) 10 MG tablet Take 1 tablet (10 mg total) by mouth daily. 01/21/19  Yes Anyanwu, Jethro Bastos, MD  furosemide (LASIX) 40 MG tablet Take 1 tablet (40 mg total) by mouth daily. 09/04/19  Yes  Petrucelli, Samantha R, PA-C  metoprolol succinate (TOPROL XL) 25 MG 24 hr tablet Take 1 tablet (25 mg total) by mouth daily. 01/20/19  Yes Anyanwu, Jethro Bastos, MD  potassium chloride SA (KLOR-CON) 20 MEQ tablet Take 1 tablet (20 mEq total) by mouth daily. 09/04/19  Yes Petrucelli, Samantha R, PA-C  ferrous sulfate 325 (65 FE) MG tablet Take 1 tablet (325 mg total) by mouth 2 (two) times daily with a meal. Patient not taking: Reported on 02/18/2019 01/20/19 09/04/19  Tereso Newcomer, MD    Allergies    Patient has no known allergies.  Review of Systems   Review of Systems  Constitutional: Negative for fever.  HENT: Negative for congestion.   Eyes: Negative for visual disturbance.  Respiratory: Positive for shortness of breath. Negative for cough.   Cardiovascular: Negative for chest pain and syncope.  Gastrointestinal: Negative for abdominal distention.  Genitourinary: Negative for difficulty urinating.  Musculoskeletal: Negative for neck pain.  Skin: Negative for color change.  Neurological: Negative for dizziness.  Psychiatric/Behavioral: Negative for agitation.    Physical Exam Updated Vital Signs BP (!) 140/93   Pulse 72   Temp 98 F (36.7 C) (Oral)   Resp 18   LMP 10/24/2019   SpO2 98%   Physical Exam Vitals and nursing note reviewed.  Constitutional:      General: She is not in acute distress.    Appearance: Normal appearance.  HENT:     Head: Normocephalic and atraumatic.     Nose: Nose normal.  Eyes:     Conjunctiva/sclera: Conjunctivae normal.     Pupils: Pupils are equal, round, and reactive to light.  Cardiovascular:     Rate and Rhythm: Normal rate and regular rhythm.     Pulses: Normal pulses.     Heart sounds: Normal heart sounds.  Pulmonary:     Breath sounds: Rales present.  Abdominal:     General: Abdomen is flat. Bowel sounds are normal.     Tenderness: There is no abdominal tenderness. There is no guarding or rebound.  Musculoskeletal:      Cervical back: Normal range of motion and neck supple.     Right lower leg: Edema present.     Left lower leg: Edema present.  Skin:    General: Skin is warm and dry.     Capillary Refill: Capillary refill takes less than 2 seconds.  Neurological:     General: No focal deficit present.     Mental Status: She is alert and oriented to person, place, and time.  Psychiatric:        Mood and Affect: Mood normal.        Behavior: Behavior normal.     ED Results / Procedures / Treatments   Labs (all labs ordered are listed, but only abnormal results are displayed) Results for orders placed or performed during the hospital encounter of 11/21/19  Basic metabolic panel  Result Value Ref Range   Sodium 137 135 - 145 mmol/L   Potassium 3.8 3.5 - 5.1 mmol/L   Chloride 103 98 - 111 mmol/L   CO2 29 22 - 32 mmol/L   Glucose, Bld 94 70 - 99 mg/dL   BUN 11 6 - 20 mg/dL   Creatinine, Ser 2.09 0.44 - 1.00 mg/dL   Calcium 8.9 8.9 - 47.0 mg/dL   GFR calc non Af Amer >60 >60 mL/min   GFR calc Af Amer >60 >60 mL/min   Anion gap 5 5 - 15  CBC  Result Value Ref Range   WBC 7.1 4.0 - 10.5 K/uL   RBC 4.66 3.87 - 5.11 MIL/uL   Hemoglobin 10.6 (L) 12.0 - 15.0 g/dL   HCT 96.2 (L) 83.6 - 62.9 %   MCV 71.7 (L) 80.0 - 100.0 fL   MCH 22.7 (L) 26.0 - 34.0 pg   MCHC 31.7 30.0 - 36.0 g/dL   RDW 47.6 (H) 54.6 - 50.3 %   Platelets 207 150 - 400 K/uL   nRBC 0.0 0.0 - 0.2 %  Brain natriuretic peptide  Result Value Ref Range  B Natriuretic Peptide 181.4 (H) 0.0 - 100.0 pg/mL  I-Stat beta hCG blood, ED  Result Value Ref Range   I-stat hCG, quantitative <5.0 <5 mIU/mL   Comment 3          Troponin I (High Sensitivity)  Result Value Ref Range   Troponin I (High Sensitivity) 37 (H) <18 ng/L  Troponin I (High Sensitivity)  Result Value Ref Range   Troponin I (High Sensitivity) 35 (H) <18 ng/L   DG Chest 2 View  Result Date: 11/21/2019 CLINICAL DATA:  Chest pain EXAM: CHEST - 2 VIEW COMPARISON:   09/04/2019 FINDINGS: Cardiac shadow is enlarged but stable. The lungs are well aerated bilaterally. No focal infiltrate or sizable effusion is seen. Mild increased vascular congestion is noted. No bony abnormality is noted. IMPRESSION: Stable cardiomegaly. Mild vascular congestion consistent with CHF. Electronically Signed   By: Alcide Clever M.D.   On: 11/21/2019 01:41    EKG EKG Interpretation  Date/Time:  Monday November 21 2019 01:03:58 EST Ventricular Rate:  86 PR Interval:    QRS Duration: 109 QT Interval:  437 QTC Calculation: 523 R Axis:   15 Text Interpretation: Sinus rhythm Prolonged PR interval RSR' in V1 or V2, right VCD or RVH Prolonged QT interval Confirmed by Amai Cappiello ([id number]) on 11/21/2019 1:29:47 AM   Radiology DG Chest 2 View  Result Date: 11/21/2019 CLINICAL DATA:  Chest pain EXAM: CHEST - 2 VIEW COMPARISON:  09/04/2019 FINDINGS: Cardiac shadow is enlarged but stable. The lungs are well aerated bilaterally. No focal infiltrate or sizable effusion is seen. Mild increased vascular congestion is noted. No bony abnormality is noted. IMPRESSION: Stable cardiomegaly. Mild vascular congestion consistent with CHF. Electronically Signed   By: Alcide Clever M.D.   On: 11/21/2019 01:41    Procedures Procedures (including critical care time)  Medications Ordered in ED Medications  sodium chloride flush (NS) 0.9 % injection 3 mL (3 mLs Intravenous Given 11/21/19 0232)  furosemide (LASIX) injection 40 mg (40 mg Intravenous Given 11/21/19 0232)  acetaminophen (TYLENOL) tablet 1,000 mg (1,000 mg Oral Given 11/21/19 0335)    ED Course  I have reviewed the triage vital signs and the nursing notes.  Pertinent labs & imaging results that were available during my care of the patient were reviewed by me and considered in my medical decision making (see chart for details).  Given anasarca it is liekyl that home lasix dose cannot overcome bowel edema and patient needs IV lasix and inpatient  management with medication titration and reasssessment.  clinical Impression(s) / ED Diagnoses Final diagnoses:  Acute pulmonary edema (HCC)  Anasarca  Admit to medicine    Kaye Mitro, MD 11/21/19 [id number]

## 2019-11-21 NOTE — ED Triage Notes (Signed)
Pt reports bilateral lower leg swelling and chest pain today. She states that she knows it is her CHF. She reports compliance with her medications.

## 2019-11-21 NOTE — Progress Notes (Signed)
TOC CM referral received, noted pt left AMA. Isidoro Donning RN CCM, [facility] ED TOC CM [phone]

## 2019-11-21 NOTE — H&P (Signed)
History and Physical  Patient Name: Lisa Vazquez     BJS:[id number]    DOB: 05-12-1986    DOA: 11/21/2019 PCP: Department, [facility]  Patient coming from: Home  Chief Complaint: Dyspnea on exertion     Patient left hospital AMA. While waiting for transfer upstairs, patient received phone call from her mother that her grandmother was deteriorating in health.  The patient felt she was needed urgently.  I discussed risks of discharge, including excess morbidity, possible sudden cardiac death, and the uncertainty of above outcomes, and need to return promptly to the hospital.  Patient expressed understanding and left against medical advice.            HPI: Lisa Vazquez is a 34 y.o. F with hx of gestational diabetes, postpartum cardiomyopathy, lost to follow-up, and hypertension who presents with 1 to 2 weeks of progressive dyspnea on exertion, paroxysmal nocturnal dyspnea, and leg swelling.  Patient was diagnosed with postpartum cardiomyopathy and EF in 2018 after her first child.  She is managed by Dr. Anne Fu, is euvolemic, NYHA I and not on diuretic at baseline, but has been lost to follow-up.  She takes her metoprolol and enalapril regularly, but does not require Lasix at baseline, and does not weigh daily, and in the last week has noticed ankle swelling, dyspnea on exertion, exertional chest discomfort, and PND so she finally came to the ER.  In the ER, blood pressure 163/94, BUN and creatinine normal, hemoglobin 10.6 and microcytic.  High-sensitivity troponin 37, BNP 181.  Pregnancy test negative.  Chest x-ray showed congestion, no effusion or overt edema.  ECG showed prolonged QT interval, no ischemic changes.  She was given IV Lasix in the hospital service were asked to evaluate for admission.            ROS: Review of Systems  Constitutional: Negative for chills, fever and malaise/fatigue.  Respiratory: Positive for sputum production and  shortness of breath. Negative for cough, hemoptysis and wheezing.   Cardiovascular: Positive for chest pain, leg swelling and PND. Negative for orthopnea.  Gastrointestinal: Negative for blood in stool and melena.  All other systems reviewed and are negative.         Past Medical History:  Diagnosis Date  . Acute CHF (HCC) 03/15/2017  . Atypical squamous cell changes of undetermined significance (ASCUS) on cervical cytology with positive high risk human papilloma virus (HPV) 03/17/2013   12/2012 pap smear ASCUS +HPV-  03/17/2013 colpo- [ ] Needs Colpo postpartum 2018  . Bacterial vaginal infection 04/06/2013  . Bacterial vaginosis 04/06/2013  . BV (bacterial vaginosis)   . CHF (congestive heart failure) (HCC)   . Chronic hypertension during pregnancy, antepartum 07/17/2016   [x]  Aspirin 81 mg daily after 12 weeks; discontinue after 36 weeks--Rx sent Current antihypertensives:  None   Baseline and surveillance labs (pulled in from [facility] refresh links as needed)  Lab Results Component Value Date  PLT 240 07/05/2016  CREATININE 0.74 07/17/2016  AST 11 07/17/2016  ALT 5 07/17/2016  Baseline PCR 347  Antenatal Testing CHTN - O10.919  Group I  BP < 140/90, no preeclampsia, AGA,  nml AFV, +/- meds    Group II BP > 140/90, on meds, no preeclampsia, AGA, nml AFV  20-28-34-38  20-24-28-32-35-38  32//2 x wk  28//BPP wkly then 32//2 x wk  40 no meds; 39 meds  PRN or 37 Pre-eclampsia  GHTN - O13.9/Preeclampsia without severe features  - O14.00   Preeclampsia with severe  features - O14.10  Q 3-4wks  Q 2 wks  28//BPP wkly then 32//2 x wk  Inpatient  37  PRN or 34    . CIN I (cervical intraepithelial neoplasia I) 04/06/2013  . Essential hypertension 03/08/2017  . Gestational diabetes    gestational  . 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   . History of cesarean delivery 02/03/2017  . History of recurrent UTIs 07/24/2016   [ ] Need UTI suppression  [ ] Ask if she took Keflex  07/06/16. Culture Positive 07/17/16. Not she if this is Tx failure or failure to take ABX.   Patient did finally take ATB- she is now on suppression  . Hypertension   . Kidney infection   . Marijuana use 07/26/2016   Pos 07/11/16  . Postpartum care and examination 03/18/2017  . 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 Pregnancy induced hypertension   . Systolic and diastolic CHF, acute (HCC) 03/08/2017  . UTI (lower urinary tract infection)     Past Surgical History:  Procedure Laterality Date  . CESAREAN SECTION     x3  . CESAREAN SECTION WITH BILATERAL TUBAL LIGATION Bilateral 02/04/2017   pt denies having had tubal ligation; unable to delete  . CESAREAN SECTION WITH BILATERAL TUBAL LIGATION Bilateral 01/18/2019   Procedure: CESAREAN SECTION WITH BILATERAL TUBAL LIGATION;  Surgeon: 02/06/2017, DO;  Location: [facility] LD ORS;  Service: Obstetrics;  Laterality: Bilateral;  . TOOTH EXTRACTION  06/2018    Social History: Patient lives with her two children and their father.  The patient walks unassisted.  She does not work.  Occasional smoker.  Denies alcohol or illicit drugs.  No Known Allergies  Family history: family history includes ADD / ADHD in her son; Asthma in her brother and son; Cancer in her paternal grandmother; Diabetes in her paternal aunt; Drug abuse in her father; Heart disease in her father, paternal grandmother, and paternal uncle; Hypertension in her father, maternal grandfather, maternal grandmother, mother, paternal grandfather, and paternal grandmother; Miscarriages / Levie Heritage in her mother; Varicose Veins in her mother.  Prior to Admission medications   Medication Sig Start Date End Date Taking? Authorizing Provider  enalapril (VASOTEC) 10 MG tablet Take 1 tablet (10 mg total) by mouth daily. 01/21/19  Yes Anyanwu, [place], MD  furosemide (LASIX) 40 MG tablet Take 1 tablet (40 mg total) by mouth daily. 09/04/19  Yes Petrucelli, Samantha R, PA-C  metoprolol  succinate (TOPROL XL) 25 MG 24 hr tablet Take 1 tablet (25 mg total) by mouth daily. 01/20/19  Yes Anyanwu, 09/06/19, MD  potassium chloride SA (KLOR-CON) 20 MEQ tablet Take 1 tablet (20 mEq total) by mouth daily. 09/04/19  Yes Petrucelli, Samantha R, PA-C  ferrous sulfate 325 (65 FE) MG tablet Take 1 tablet (325 mg total) by mouth 2 (two) times daily with a meal. Patient not taking: Reported on 02/18/2019 01/20/19 09/04/19  01/22/19, MD       Physical Exam: BP (!) 136/104   Pulse 79   Temp 98 F (36.7 C) (Oral)   Resp 18   LMP 10/24/2019   SpO2 96%  General appearance: Well-developed, adult female, alert and in no acute distress.   Eyes: Anicteric, conjunctiva pink, lids and lashes normal. PERRL.    ENT: No nasal deformity, discharge, epistaxis.  Hearing normal. OP moist without lesions.  Dentition and lips normal. Neck: No neck masses.  Trachea midline.  No thyromegaly/tenderness. Lymph: No cervical  or supraclavicular lymphadenopathy. Skin: Warm and dry.  No jaundice.  No suspicious rashes or lesions. Cardiac: RRR, nl S1-S2, no murmurs appreciated.  Capillary refill is brisk.  JVP elevated to mid neck.  1+ bilateral LE edema.  Radial pulses 2+ and symmetric. Respiratory: Normal respiratory rate and rhythm.  CTAB without rales or wheezes. Abdomen: Abdomen soft.  No TTP or gaurding. No ascites, distension, hepatosplenomegaly.   MSK: No deformities or effusions of the large joints of the upper or lower extremities bilaterally.  No cyanosis. Slight clubbing. Neuro: Cranial nerves grossly normal. Speech is fluent.  Muscle strength 5/5 symmetrically.    Psych: Sensorium intact and responding to questions, attention normal.  Behavior appropriate.  Affect tearful.  Judgment and insight appear normal.     Labs on Admission:  I have personally reviewed following labs and imaging studies: CBC: Recent Labs  Lab 11/21/19 0227  WBC 7.1  HGB 10.6*  HCT 33.4*  MCV 71.7*  PLT 836    Basic Metabolic Panel: Recent Labs  Lab 11/21/19 0227  NA 137  K 3.8  CL 103  CO2 29  GLUCOSE 94  BUN 11  CREATININE 0.91  CALCIUM 8.9   GFR: CrCl cannot be calculated (Unknown ideal weight.).  Cardiac Enzymes: Hs Trop 37  BNP (last 3 results) 181          Radiological Exams on Admission: Personally reviewed CXR showed cardiomegaly, no effusions or frank edema, no pneumonia: DG Chest 2 View  Result Date: 11/21/2019 CLINICAL DATA:  Chest pain EXAM: CHEST - 2 VIEW COMPARISON:  09/04/2019 FINDINGS: Cardiac shadow is enlarged but stable. The lungs are well aerated bilaterally. No focal infiltrate or sizable effusion is seen. Mild increased vascular congestion is noted. No bony abnormality is noted. IMPRESSION: Stable cardiomegaly. Mild vascular congestion consistent with CHF. Electronically Signed   By: Inez Catalina M.D.   On: 11/21/2019 01:41    EKG: Independently reviewed. Rate normal, ST segments normal, no TW changes from prior, QTc long.       Assessment/Plan   Acute on chronic systolic and diastolic CHF -Furosemide 40 mg IV twice a day  -K supplement -Strict I/Os, daily weights, telemetry  -Daily monitoring renal function -Obtain echocardiogram -Consult cardiology -Continue metoprolol and enalapril   Essential hypertension Blood pressure elevated -Resume enalapril and metoprolol   Microcytic anemia No clinical blood loss, no melena or hematochezia. -Check iron studies  Prolonged QT interval -Check mag -Keep mag>2, K>4      DVT prophylaxis: Lovenox  Code Status: FULL  Family Communication:   Consults called: Cardiology inbasket message sent Admission status: OBS     Medical decision making: Patient seen at 8:43 AM on 11/21/2019.  What exists of the patient's chart was reviewed in depth and summarized above.  Clinical condition: stable.                   [facility] Please page though [place] or  [facility] secure chat:  For password, contact charge nurse

## 2019-11-21 NOTE — ED Notes (Signed)
Pt ambulatory to and from bathroom, no assistance needed.  

## 2019-11-21 NOTE — ED Notes (Signed)
NT notified this RN that pt was crying, wanting to leave to be with family.  Admitting provider [place] made aware.

## 2019-12-16 NOTE — Addendum Note (Signed)
Addended by: Donato Schultz C on: 12/16/2019 09:16 AM   Modules accepted: Level of Service, SmartSet

## 2019-12-16 NOTE — Progress Notes (Signed)
This encounter was created in error - please disregard.

## 2019-12-29 ENCOUNTER — Ambulatory Visit: Payer: Medicaid Other

## 2020-01-02 ENCOUNTER — Ambulatory Visit: Payer: Medicaid Other

## 2020-01-10 ENCOUNTER — Ambulatory Visit: Payer: Medicaid Other

## 2020-02-14 ENCOUNTER — Other Ambulatory Visit: Payer: Self-pay

## 2020-02-14 ENCOUNTER — Emergency Department (HOSPITAL_COMMUNITY): Payer: Medicaid Other

## 2020-02-14 ENCOUNTER — Encounter (HOSPITAL_COMMUNITY): Payer: Self-pay | Admitting: Emergency Medicine

## 2020-02-14 DIAGNOSIS — Z5321 Procedure and treatment not carried out due to patient leaving prior to being seen by health care provider: Secondary | ICD-10-CM | POA: Diagnosis not present

## 2020-02-14 DIAGNOSIS — R0789 Other chest pain: Secondary | ICD-10-CM | POA: Diagnosis not present

## 2020-02-14 DIAGNOSIS — R0602 Shortness of breath: Secondary | ICD-10-CM | POA: Diagnosis not present

## 2020-02-14 LAB — BASIC METABOLIC PANEL
Anion gap: 8 (ref 5–15)
BUN: 7 mg/dL (ref 6–20)
CO2: 30 mmol/L (ref 22–32)
Calcium: 9 mg/dL (ref 8.9–10.3)
Chloride: 102 mmol/L (ref 98–111)
Creatinine, Ser: 1 mg/dL (ref 0.44–1.00)
GFR calc Af Amer: >60 mL/min (ref >60)
GFR calc non Af Amer: >60 mL/min (ref >60)
Glucose, Bld: 108 mg/dL — ABNORMAL HIGH (ref 70–99)
Potassium: 3.4 mmol/L — ABNORMAL LOW (ref 3.5–5.1)
Sodium: 140 mmol/L (ref 135–145)

## 2020-02-14 LAB — CBC
HCT: 33.5 % — ABNORMAL LOW (ref 36.0–46.0)
Hemoglobin: 10.2 g/dL — ABNORMAL LOW (ref 12.0–15.0)
MCH: 22.4 pg — ABNORMAL LOW (ref 26.0–34.0)
MCHC: 30.4 g/dL (ref 30.0–36.0)
MCV: 73.6 fL — ABNORMAL LOW (ref 80.0–100.0)
Platelets: 211 10*3/uL (ref 150–400)
RBC: 4.55 MIL/uL (ref 3.87–5.11)
RDW: 19.4 % — ABNORMAL HIGH (ref 11.5–15.5)
WBC: 5.8 10*3/uL (ref 4.0–10.5)
nRBC: 0 % (ref 0.0–0.2)

## 2020-02-14 LAB — TROPONIN I (HIGH SENSITIVITY): Troponin I (High Sensitivity): 41 ng/L — ABNORMAL HIGH (ref <18)

## 2020-02-14 LAB — BRAIN NATRIURETIC PEPTIDE: B Natriuretic Peptide: 422.4 pg/mL — ABNORMAL HIGH (ref 0.0–100.0)

## 2020-02-14 LAB — I-STAT BETA HCG BLOOD, ED (NOT ORDERABLE): I-stat hCG, quantitative: <5 m[IU]/mL (ref <5)

## 2020-02-14 MED ORDER — SODIUM CHLORIDE 0.9% FLUSH: 3.0000 mL | Freq: Once | INTRAVENOUS | Status: DC

## 2020-02-14 NOTE — ED Triage Notes (Signed)
Patient here from home reporting chest pain and SOB that started 3 days ago, increased with movement. Denies n/v.

## 2020-02-15 ENCOUNTER — Emergency Department (HOSPITAL_COMMUNITY)
Admission: EM | Admit: 2020-02-15 | Discharge: 2020-02-15 | Disposition: A | Discharge status: Home / Self Care | Payer: Medicaid Other | Attending: Emergency Medicine | Admitting: Emergency Medicine

## 2020-02-19 ENCOUNTER — Emergency Department (HOSPITAL_COMMUNITY): Payer: Medicaid Other

## 2020-02-19 ENCOUNTER — Emergency Department (HOSPITAL_COMMUNITY)
Admission: EM | Admit: 2020-02-19 | Discharge: 2020-02-19 | Discharge status: Against Medical Advice | Payer: Medicaid Other | Attending: Emergency Medicine | Admitting: Emergency Medicine

## 2020-02-19 ENCOUNTER — Encounter (HOSPITAL_COMMUNITY): Payer: Self-pay | Admitting: Emergency Medicine

## 2020-02-19 ENCOUNTER — Other Ambulatory Visit: Payer: Self-pay

## 2020-02-19 DIAGNOSIS — F17210 Nicotine dependence, cigarettes, uncomplicated: Secondary | ICD-10-CM | POA: Diagnosis not present

## 2020-02-19 DIAGNOSIS — R2243 Localized swelling, mass and lump, lower limb, bilateral: Secondary | ICD-10-CM | POA: Diagnosis not present

## 2020-02-19 DIAGNOSIS — I110 Hypertensive heart disease with heart failure: Secondary | ICD-10-CM | POA: Diagnosis not present

## 2020-02-19 DIAGNOSIS — R635 Abnormal weight gain: Secondary | ICD-10-CM | POA: Diagnosis not present

## 2020-02-19 DIAGNOSIS — Z5329 Procedure and treatment not carried out because of patient's decision for other reasons: Secondary | ICD-10-CM | POA: Diagnosis not present

## 2020-02-19 DIAGNOSIS — I5040 Unspecified combined systolic (congestive) and diastolic (congestive) heart failure: Secondary | ICD-10-CM | POA: Insufficient documentation

## 2020-02-19 DIAGNOSIS — R0600 Dyspnea, unspecified: Secondary | ICD-10-CM | POA: Insufficient documentation

## 2020-02-19 DIAGNOSIS — Z79899 Other long term (current) drug therapy: Secondary | ICD-10-CM | POA: Insufficient documentation

## 2020-02-19 MED ORDER — SODIUM CHLORIDE 0.9 % IV SOLN: INTRAVENOUS | Status: DC

## 2020-02-19 NOTE — ED Provider Notes (Signed)
[name]-EMERGENCY DEPT Provider Note   CSN: [id number] Arrival date & time: 02/19/20  1649     History Chief Complaint  Patient presents with  . Shortness of Breath    Lisa Vazquez is a 34 y.o. female.  34 year old female who has a history of cardiomyopathy presents with progressing dyspnea on exertion for the past week.  States that she is a trouble caring her 76-year-old.  Denies any current chest pain or chest pressure.  Has had no fever or cough or congestion.  Does note increased weight gain.  Does not take any diuretics at this time.  States compliance with her other medications.        Past Medical History:  Diagnosis Date  . Acute CHF (HCC) 03/15/2017  . Atypical squamous cell changes of undetermined significance (ASCUS) on cervical cytology with positive high risk human papilloma virus (HPV) 03/17/2013   12/2012 pap smear ASCUS +HPV-  03/17/2013 colpo- [ ] Needs Colpo postpartum 2018  . Bacterial vaginal infection 04/06/2013  . Bacterial vaginosis 04/06/2013  . BV (bacterial vaginosis)   . CHF (congestive heart failure) (HCC)   . Chronic hypertension during pregnancy, antepartum 07/17/2016   [x]  Aspirin 81 mg daily after 12 weeks; discontinue after 36 weeks--Rx sent Current antihypertensives:  None   Baseline and surveillance labs (pulled in from [facility], refresh links as needed)  Lab Results Component Value Date  PLT 240 07/05/2016  CREATININE 0.74 07/17/2016  AST 11 07/17/2016  ALT 5 07/17/2016  Baseline PCR 347  Antenatal Testing CHTN - O10.919  Group I  BP < 140/90, no preeclampsia, AGA,  nml AFV, +/- meds    Group II BP > 140/90, on meds, no preeclampsia, AGA, nml AFV  20-28-34-38  20-24-28-32-35-38  32//2 x wk  28//BPP wkly then 32//2 x wk  40 no meds; 39 meds  PRN or 37 Pre-eclampsia  GHTN - O13.9/Preeclampsia without severe features  - O14.00   Preeclampsia with severe features - O14.10  Q 3-4wks  Q 2 wks  28//BPP wkly then 32//2 x wk  Inpatient   37  PRN or 34    . CIN I (cervical intraepithelial neoplasia I) 04/06/2013  . Essential hypertension 03/08/2017  . Gestational diabetes    gestational  . 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   . History of cesarean delivery 02/03/2017  . History of recurrent UTIs 07/24/2016   [ ] Need UTI suppression  [ ] Ask if she took Keflex 07/06/16. Culture Positive 07/17/16. Not she if this is Tx failure or failure to take ABX.   Patient did finally take ATB- she is now on suppression  . Hypertension   . Kidney infection   . Marijuana use 07/26/2016   Pos 07/11/16  . Postpartum care and examination 03/18/2017  . 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 Pregnancy induced hypertension   . Systolic and diastolic CHF, acute (HCC) 03/08/2017  . UTI (lower urinary tract infection)     Patient Active Problem List   Diagnosis Date Noted  . Acute on chronic systolic (congestive) heart failure (HCC) 11/21/2019  . Microcytic anemia 11/21/2019  . Acute exacerbation of CHF (congestive heart failure) (HCC) 09/04/2019  . 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 Status post cesarean delivery 01/18/2019  . Cesarean delivery delivered 01/18/2019  . Unwanted fertility 12/23/2018  . History of preterm delivery, currently pregnant 09/30/2018  . Late prenatal care affecting pregnancy in second trimester 09/30/2018  . History  of Postpartum Dilated Cardiomyopathy  03/15/2017  . Essential hypertension 03/08/2017  . History of cesarean delivery 02/03/2017  . Marijuana use 07/26/2016  . High blood hemoglobin F (HCC) 07/26/2016  . CIN I (cervical intraepithelial neoplasia I) 04/06/2013  . Atypical squamous cell changes of undetermined significance (ASCUS) on cervical cytology with positive high risk human papilloma virus (HPV) 03/17/2013    Past Surgical History:  Procedure Laterality Date  . CESAREAN SECTION     x3  . CESAREAN SECTION WITH BILATERAL TUBAL LIGATION Bilateral  02/04/2017   pt denies having had tubal ligation; unable to delete  . CESAREAN SECTION WITH BILATERAL TUBAL LIGATION Bilateral 01/18/2019   Procedure: CESAREAN SECTION WITH BILATERAL TUBAL LIGATION;  Surgeon: Truett Mainland, DO;  Location: [facility]S;  Service: Obstetrics;  Laterality: Bilateral;  . TOOTH EXTRACTION  06/2018     OB History    Gravida  6   Para  5   Term  3   Preterm  2   AB  1   Living  5     SAB      TAB  1   Ectopic  0   Multiple  0   Live Births  5           Family History  Problem Relation Age of Onset  . Hypertension Mother   . Miscarriages / [place] Mother   . Varicose Veins Mother   . Heart disease Father   . Hypertension Father   . Drug abuse Father   . Asthma Brother   . Hypertension Maternal Grandmother   . Hypertension Maternal Grandfather   . Heart disease Paternal Grandmother   . Hypertension Paternal Grandmother   . Cancer Paternal Grandmother   . Hypertension Paternal Grandfather   . Asthma Son   . ADD / ADHD Son   . Diabetes Paternal Aunt   . Heart disease Paternal Uncle     Social History   Tobacco Use  . Smoking status: Current Some Day Smoker    Packs/day: 0.25    Years: 7.00    Pack years: 1.75    Types: Cigarettes  . Smokeless tobacco: Former Systems developer  . Tobacco comment: 3 cigs/day, has used vape pens  Substance Use Topics  . Alcohol use: No  . Drug use: No    Comment: Subutex    Home Medications Prior to Admission medications   Medication Sig Start Date End Date Taking? Authorizing Provider  enalapril (VASOTEC) 10 MG tablet Take 1 tablet (10 mg total) by mouth daily. 01/21/19   Anyanwu, Sallyanne Havers, MD  furosemide (LASIX) 40 MG tablet Take 1 tablet (40 mg total) by mouth daily. 11/21/19   Danford, Suann Larry, MD  metoprolol succinate (TOPROL XL) 25 MG 24 hr tablet Take 1 tablet (25 mg total) by mouth daily. 01/20/19   Anyanwu, Sallyanne Havers, MD  potassium chloride SA (KLOR-CON) 20 MEQ tablet Take 1 tablet (20  mEq total) by mouth daily. 09/04/19   Petrucelli, Samantha R, PA-C  ferrous sulfate 325 (65 FE) MG tablet Take 1 tablet (325 mg total) by mouth 2 (two) times daily with a meal. Patient not taking: Reported on 02/18/2019 01/20/19 09/04/19  Osborne Oman, MD    Allergies    Patient has no known allergies.  Review of Systems   Review of Systems  All other systems reviewed and are negative.   Physical Exam Updated Vital Signs BP (!) 140/108 (BP Location: Left Arm)  Pulse 74   Temp 98.2 F (36.8 C) (Oral)   Resp 18   SpO2 98%   Physical Exam Vitals and nursing note reviewed.  Constitutional:      General: She is not in acute distress.    Appearance: Normal appearance. She is well-developed. She is not toxic-appearing.  HENT:     Head: Normocephalic and atraumatic.  Eyes:     General: Lids are normal.     Conjunctiva/sclera: Conjunctivae normal.     Pupils: Pupils are equal, round, and reactive to light.  Neck:     Thyroid: No thyroid mass.     Trachea: No tracheal deviation.  Cardiovascular:     Rate and Rhythm: Normal rate and regular rhythm.     Heart sounds: Normal heart sounds. No murmur. No gallop.   Pulmonary:     Effort: Pulmonary effort is normal. No respiratory distress.     Breath sounds: Normal breath sounds. No stridor. No decreased breath sounds, wheezing, rhonchi or rales.  Abdominal:     General: Bowel sounds are normal. There is no distension.     Palpations: Abdomen is soft.     Tenderness: There is no abdominal tenderness. There is no rebound.  Musculoskeletal:        General: No tenderness. Normal range of motion.     Cervical back: Normal range of motion and neck supple.  Lymphadenopathy:     Comments: 2+ bilateral lower extremity pitting edema  Skin:    General: Skin is warm and dry.     Findings: No abrasion or rash.  Neurological:     Mental Status: She is alert and oriented to person, place, and time.     GCS: GCS eye subscore is 4. GCS  verbal subscore is 5. GCS motor subscore is 6.     Cranial Nerves: No cranial nerve deficit.     Sensory: No sensory deficit.  Psychiatric:        Speech: Speech normal.        Behavior: Behavior normal.     ED Results / Procedures / Treatments   Labs (all labs ordered are listed, but only abnormal results are displayed) Labs Reviewed  CBC WITH DIFFERENTIAL/PLATELET  BASIC METABOLIC PANEL  BRAIN NATRIURETIC PEPTIDE    EKG EKG Interpretation  Date/Time:  Sunday Feb 19 2020 17:05:50 EDT Ventricular Rate:  91 PR Interval:    QRS Duration: 107 QT Interval:  378 QTC Calculation: 466 R Axis:   39 Text Interpretation: Sinus rhythm Ventricular premature complex Aberrant conduction of SV complex(es) Biatrial enlargement RSR' in V1 or V2, right VCD or RVH ST elevation, consider lateral injury 12 Lead; Mason-Likar Confirmed by Lorre Nick ([id number]) on 02/19/2020 6:12:33 PM   Radiology DG Chest 2 View  Result Date: 02/19/2020 CLINICAL DATA:  Shortness of breath.  History of CHF. EXAM: CHEST - 2 VIEW COMPARISON:  Most recent radiograph 5 days ago 02/14/2020 FINDINGS: Stable cardiomegaly. Unchanged mediastinal contours. Slight progression of vascular congestion from prior exam. No pleural effusion. No focal airspace disease. No pneumothorax. No acute osseous abnormalities. IMPRESSION: Slight progression of vascular congestion from prior exam. Stable cardiomegaly. Electronically Signed   By: Narda Rutherford M.D.   On: 02/19/2020 17:35    Procedures Procedures (including critical care time)  Medications Ordered in ED Medications  0.9 %  sodium chloride infusion (has no administration in time range)    ED Course  I have reviewed the triage vital signs and the nursing notes.  Pertinent labs & imaging results that were available during my care of the patient were reviewed by me and considered in my medical decision making (see chart for details).    MDM Rules/Calculators/A&P                       Patient with x-ray concerning for CHF.  Blood work has been ordered and was informed by nursing that patient had urgent childcare needs and she left AMA.  This was done prior to blood work being performed Final Clinical Impression(s) / ED Diagnoses Final diagnoses:  None    Rx / DC Orders ED Discharge Orders    None       Lorre Nick, MD 02/19/20 1842

## 2020-02-19 NOTE — ED Notes (Signed)
Went into pt's room to collect blood and start and IV.  Pt states that she needs to go and take care of her kids.  She feels that they are not safe and needs to go pick them up.  She would come right back.  Pt informed that she will start this process again and that there are risks to her leaving prior to being discharged.  Pt expressed understanding.  Pt ambulated out the emergency department without incident prior to lab collection. Dr. Freida Busman made aware.

## 2020-02-19 NOTE — ED Triage Notes (Signed)
Patient here from home reporting SOB that started a week ago. Hx of same. Also reports CHF after giving birth. States that she has not been able to go to cardiology appt.

## 2020-02-22 ENCOUNTER — Ambulatory Visit: Payer: Medicaid Other | Admitting: Cardiology

## 2020-02-22 NOTE — Progress Notes (Deleted)
Cardiology Office Note:    Date:  02/22/2020   ID:  Lisa Vazquez, DOB 08/21/1986, MRN [id number]  PCP:  Department, [facility]  Cardiologist:  Donato Schultz, MD  Electrophysiologist:  None   Referring MD: Department, [place]   No chief complaint on file. ***  History of Present Illness:    Lisa Vazquez is a 34 y.o. female here for follow-up of dyspnea.  Last visit she has had 5 emergency department visits.  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  CT scan was negative for PE but cardiomegaly was noted at that time.  BNP was elevated and T wave inversions were noted in the lateral leads.  She was counseled about risks of future pregnancies.  In 09/30/2018 at [redacted] weeks pregnant she return to our clinic for evaluation.  Toprol 25 mg was started.  Risk of intrauterine growth restriction as well as neonatal bradycardia and hypoglycemia were discussed.  No ACE inhibitor  C-section was performed.  She has had several missed appointments.  Last ER visit was 02/19/2020 where she was having dyspnea on exertion for over a week having trouble caring for her 53-year-old.  Pressure at that time was 140/108.  She ended up leaving AMA prior to blood work.   Past Medical History:  Diagnosis Date  . Acute CHF (HCC) 03/15/2017  . Atypical squamous cell changes of undetermined significance (ASCUS) on cervical cytology with positive high risk human papilloma virus (HPV) 03/17/2013   12/2012 pap smear ASCUS +HPV-  03/17/2013 colpo- [ ] Needs Colpo postpartum 2018  . Bacterial vaginal infection 04/06/2013  . Bacterial vaginosis 04/06/2013  . BV (bacterial vaginosis)   . CHF (congestive heart failure) (HCC)   . Chronic hypertension during pregnancy, antepartum 07/17/2016   [x]  Aspirin 81 mg daily after 12 weeks; discontinue after 36 weeks--Rx sent Current antihypertensives:  None   Baseline and surveillance labs (pulled in from [facility], refresh links as needed)   Lab Results Component Value Date  PLT 240 07/05/2016  CREATININE 0.74 07/17/2016  AST 11 07/17/2016  ALT 5 07/17/2016  Baseline PCR 347  Antenatal Testing CHTN - O10.919  Group I  BP < 140/90, no preeclampsia, AGA,  nml AFV, +/- meds    Group II BP > 140/90, on meds, no preeclampsia, AGA, nml AFV  20-28-34-38  20-24-28-32-35-38  32//2 x wk  28//BPP wkly then 32//2 x wk  40 no meds; 39 meds  PRN or 37 Pre-eclampsia  GHTN - O13.9/Preeclampsia without severe features  - O14.00   Preeclampsia with severe features - O14.10  Q 3-4wks  Q 2 wks  28//BPP wkly then 32//2 x wk  Inpatient  37  PRN or 34    . CIN I (cervical intraepithelial neoplasia I) 04/06/2013  . Essential hypertension 03/08/2017  . Gestational diabetes    gestational  . 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   . History of cesarean delivery 02/03/2017  . History of recurrent UTIs 07/24/2016   [ ] Need UTI suppression  [ ] Ask if she took Keflex 07/06/16. Culture Positive 07/17/16. Not she if this is Tx failure or failure to take ABX.   Patient did finally take ATB- she is now on suppression  . Hypertension   . Kidney infection   . Marijuana use 07/26/2016   Pos 07/11/16  . Postpartum care and examination 03/18/2017  . 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 Pregnancy induced hypertension   .  Systolic and diastolic CHF, acute (HCC) 03/08/2017  . UTI (lower urinary tract infection)     Past Surgical History:  Procedure Laterality Date  . CESAREAN SECTION     x3  . CESAREAN SECTION WITH BILATERAL TUBAL LIGATION Bilateral 02/04/2017   pt denies having had tubal ligation; unable to delete  . CESAREAN SECTION WITH BILATERAL TUBAL LIGATION Bilateral 01/18/2019   Procedure: CESAREAN SECTION WITH BILATERAL TUBAL LIGATION;  Surgeon: Levie Heritage, DO;  Location: [facility];  Service: Obstetrics;  Laterality: Bilateral;  . TOOTH EXTRACTION  06/2018    Current Medications: No outpatient medications  have been marked as taking for the 02/22/20 encounter (Appointment) with Jake Bathe, MD.     Allergies:   Patient has no known allergies.   Social History   Socioeconomic History  . Marital status: Single    Spouse name: Not on file  . Number of children: 4  . Years of education: Not on file  . Highest education level: Not on file  Occupational History  . Occupation: unemployed  Tobacco Use  . Smoking status: Current Some Day Smoker    Packs/day: 0.25    Years: 7.00    Pack years: 1.75    Types: Cigarettes  . Smokeless tobacco: Former Neurosurgeon  . Tobacco comment: 3 cigs/day, has used vape pens  Substance and Sexual Activity  . Alcohol use: No  . Drug use: No    Comment: Subutex  . Sexual activity: Yes  Other Topics Concern  . Not on file  Social History Narrative  . Not on file   Social Determinants of Health   Financial Resource Strain:   . Difficulty of Paying Living Expenses:   Food Insecurity:   . Worried About Programme researcher, broadcasting/film/video in the Last Year:   . Barista in the Last Year:   Transportation Needs:   . Freight forwarder (Medical):   Marland Kitchen Lack of Transportation (Non-Medical):   Physical Activity:   . Days of Exercise per Week:   . Minutes of Exercise per Session:   Stress:   . Feeling of Stress :   Social Connections:   . Frequency of Communication with Friends and Family:   . Frequency of Social Gatherings with Friends and Family:   . Attends Religious Services:   . Active Member of Clubs or Organizations:   . Attends Banker Meetings:   Marland Kitchen Marital Status:      Family History: The patient's ***family history includes ADD / ADHD in her son; Asthma in her brother and son; Cancer in her paternal grandmother; Diabetes in her paternal aunt; Drug abuse in her father; Heart disease in her father, paternal grandmother, and paternal uncle; Hypertension in her father, maternal grandfather, maternal grandmother, mother, paternal grandfather,  and paternal grandmother; Miscarriages / [place] in her mother; Varicose Veins in her mother.  ROS:   Please see the history of present illness.    *** All other systems reviewed and are negative.  EKGs/Labs/Other Studies Reviewed:    The following studies were reviewed today: ***  EKG:  EKG is *** ordered today.  The ekg ordered today demonstrates ***  Recent Labs: 09/04/2019: ALT 16; Magnesium 2.0 02/14/2020: B Natriuretic Peptide 422.4; BUN 7; Creatinine, Ser 1.00; Hemoglobin 10.2; Platelets 211; Potassium 3.4; Sodium 140  Recent Lipid Panel    Component Value Date/Time   CHOL 154 04/06/2013 1151   TRIG 42 04/06/2013 1151   HDL 72  04/06/2013 1151   CHOLHDL 2.1 04/06/2013 1151   VLDL 8 04/06/2013 1151   LDLCALC 74 04/06/2013 1151    Physical Exam:    VS:  There were no vitals taken for this visit.    Wt Readings from Last 3 Encounters:  09/04/19 160 lb (72.6 kg)  02/18/19 166 lb (75.3 kg)  01/18/19 187 lb (84.8 kg)     GEN: *** Well nourished, well developed in no acute distress HEENT: Normal NECK: No JVD; No carotid bruits LYMPHATICS: No lymphadenopathy CARDIAC: ***RRR, no murmurs, rubs, gallops RESPIRATORY:  Clear to auscultation without rales, wheezing or rhonchi  ABDOMEN: Soft, non-tender, non-distended MUSCULOSKELETAL:  No edema; No deformity  SKIN: Warm and dry NEUROLOGIC:  Alert and oriented x 3 PSYCHIATRIC:  Normal affect   ASSESSMENT:    No diagnosis found. PLAN:    In order of problems listed above:  1. ***   Medication Adjustments/Labs and Tests Ordered: Current medicines are reviewed at length with the patient today.  Concerns regarding medicines are outlined above.  No orders of the defined types were placed in this encounter.  No orders of the defined types were placed in this encounter.   There are no Patient Instructions on file for this visit.   Signed, Candee Furbish, MD  02/22/2020 9:15 AM    [place]

## 2020-02-24 ENCOUNTER — Ambulatory Visit: Payer: Medicaid Other | Admitting: Cardiology

## 2020-07-27 ENCOUNTER — Emergency Department (HOSPITAL_COMMUNITY): Payer: Medicaid Other

## 2020-07-27 ENCOUNTER — Inpatient Hospital Stay (HOSPITAL_COMMUNITY)
Admission: EM | Admit: 2020-07-27 | Discharge: 2020-08-22 | DRG: 308 | Disposition: E | Payer: Medicaid Other | Attending: Pulmonary Disease | Admitting: Pulmonary Disease

## 2020-07-27 DIAGNOSIS — E875 Hyperkalemia: Secondary | ICD-10-CM | POA: Diagnosis not present

## 2020-07-27 DIAGNOSIS — J810 Acute pulmonary edema: Secondary | ICD-10-CM | POA: Diagnosis present

## 2020-07-27 DIAGNOSIS — G40901 Epilepsy, unspecified, not intractable, with status epilepticus: Secondary | ICD-10-CM | POA: Diagnosis present

## 2020-07-27 DIAGNOSIS — Z66 Do not resuscitate: Secondary | ICD-10-CM | POA: Diagnosis not present

## 2020-07-27 DIAGNOSIS — Z0189 Encounter for other specified special examinations: Secondary | ICD-10-CM

## 2020-07-27 DIAGNOSIS — I081 Rheumatic disorders of both mitral and tricuspid valves: Secondary | ICD-10-CM | POA: Diagnosis present

## 2020-07-27 DIAGNOSIS — J9600 Acute respiratory failure, unspecified whether with hypoxia or hypercapnia: Secondary | ICD-10-CM

## 2020-07-27 DIAGNOSIS — R578 Other shock: Secondary | ICD-10-CM | POA: Diagnosis present

## 2020-07-27 DIAGNOSIS — G253 Myoclonus: Secondary | ICD-10-CM | POA: Diagnosis present

## 2020-07-27 DIAGNOSIS — J9690 Respiratory failure, unspecified, unspecified whether with hypoxia or hypercapnia: Secondary | ICD-10-CM

## 2020-07-27 DIAGNOSIS — N179 Acute kidney failure, unspecified: Secondary | ICD-10-CM | POA: Diagnosis not present

## 2020-07-27 DIAGNOSIS — I420 Dilated cardiomyopathy: Secondary | ICD-10-CM | POA: Diagnosis present

## 2020-07-27 DIAGNOSIS — Z9114 Patient's other noncompliance with medication regimen: Secondary | ICD-10-CM

## 2020-07-27 DIAGNOSIS — Z9119 Patient's noncompliance with other medical treatment and regimen: Secondary | ICD-10-CM

## 2020-07-27 DIAGNOSIS — I469 Cardiac arrest, cause unspecified: Principal | ICD-10-CM

## 2020-07-27 DIAGNOSIS — G931 Anoxic brain damage, not elsewhere classified: Secondary | ICD-10-CM | POA: Diagnosis present

## 2020-07-27 DIAGNOSIS — I462 Cardiac arrest due to underlying cardiac condition: Secondary | ICD-10-CM | POA: Diagnosis present

## 2020-07-27 DIAGNOSIS — K567 Ileus, unspecified: Secondary | ICD-10-CM | POA: Diagnosis not present

## 2020-07-27 DIAGNOSIS — Z20822 Contact with and (suspected) exposure to covid-19: Secondary | ICD-10-CM | POA: Diagnosis present

## 2020-07-27 DIAGNOSIS — J9602 Acute respiratory failure with hypercapnia: Secondary | ICD-10-CM | POA: Diagnosis present

## 2020-07-27 DIAGNOSIS — I4901 Ventricular fibrillation: Secondary | ICD-10-CM

## 2020-07-27 DIAGNOSIS — I5023 Acute on chronic systolic (congestive) heart failure: Secondary | ICD-10-CM | POA: Diagnosis present

## 2020-07-27 DIAGNOSIS — Z515 Encounter for palliative care: Secondary | ICD-10-CM

## 2020-07-27 DIAGNOSIS — E872 Acidosis: Secondary | ICD-10-CM | POA: Diagnosis present

## 2020-07-27 DIAGNOSIS — J9601 Acute respiratory failure with hypoxia: Secondary | ICD-10-CM | POA: Diagnosis present

## 2020-07-27 DIAGNOSIS — R570 Cardiogenic shock: Secondary | ICD-10-CM | POA: Diagnosis present

## 2020-07-27 DIAGNOSIS — R0489 Hemorrhage from other sites in respiratory passages: Secondary | ICD-10-CM | POA: Diagnosis present

## 2020-07-27 DIAGNOSIS — I361 Nonrheumatic tricuspid (valve) insufficiency: Secondary | ICD-10-CM | POA: Diagnosis not present

## 2020-07-27 DIAGNOSIS — I340 Nonrheumatic mitral (valve) insufficiency: Secondary | ICD-10-CM | POA: Diagnosis not present

## 2020-07-27 LAB — CBC WITH DIFFERENTIAL/PLATELET
Abs Immature Granulocytes: 0.74 10*3/uL — ABNORMAL HIGH (ref 0.00–0.07)
Basophils Absolute: 0.1 10*3/uL (ref 0.0–0.1)
Basophils Relative: 1 %
Eosinophils Absolute: 0.1 10*3/uL (ref 0.0–0.5)
Eosinophils Relative: 2 %
HCT: 36.3 % (ref 36.0–46.0)
Hemoglobin: 10 g/dL — ABNORMAL LOW (ref 12.0–15.0)
Immature Granulocytes: 8 %
Lymphocytes Relative: 61 %
Lymphs Abs: 5.9 10*3/uL — ABNORMAL HIGH (ref 0.7–4.0)
MCH: 19.9 pg — ABNORMAL LOW (ref 26.0–34.0)
MCHC: 27.5 g/dL — ABNORMAL LOW (ref 30.0–36.0)
MCV: 72.2 fL — ABNORMAL LOW (ref 80.0–100.0)
Monocytes Absolute: 0.5 10*3/uL (ref 0.1–1.0)
Monocytes Relative: 6 %
Neutro Abs: 2 10*3/uL (ref 1.7–7.7)
Neutrophils Relative %: 22 %
Platelets: 109 10*3/uL — ABNORMAL LOW (ref 150–400)
RBC: 5.03 MIL/uL (ref 3.87–5.11)
RDW: 23.4 % — ABNORMAL HIGH (ref 11.5–15.5)
WBC: 9.3 10*3/uL (ref 4.0–10.5)
nRBC: 0.2 % (ref 0.0–0.2)

## 2020-07-27 LAB — PROTIME-INR
INR: 1.5 — ABNORMAL HIGH (ref 0.8–1.2)
Prothrombin Time: 17.9 seconds — ABNORMAL HIGH (ref 11.4–15.2)

## 2020-07-27 LAB — COMPREHENSIVE METABOLIC PANEL
ALT: 61 U/L — ABNORMAL HIGH (ref 0–44)
AST: 118 U/L — ABNORMAL HIGH (ref 15–41)
Albumin: 2.5 g/dL — ABNORMAL LOW (ref 3.5–5.0)
Alkaline Phosphatase: 66 U/L (ref 38–126)
Anion gap: 15 (ref 5–15)
BUN: <5 mg/dL — ABNORMAL LOW (ref 6–20)
CO2: 19 mmol/L — ABNORMAL LOW (ref 22–32)
Calcium: 7.7 mg/dL — ABNORMAL LOW (ref 8.9–10.3)
Chloride: 104 mmol/L (ref 98–111)
Creatinine, Ser: 1.36 mg/dL — ABNORMAL HIGH (ref 0.44–1.00)
GFR, Estimated: 52 mL/min — ABNORMAL LOW (ref >60)
Glucose, Bld: 345 mg/dL — ABNORMAL HIGH (ref 70–99)
Potassium: 3.1 mmol/L — ABNORMAL LOW (ref 3.5–5.1)
Sodium: 138 mmol/L (ref 135–145)
Total Bilirubin: 0.4 mg/dL (ref 0.3–1.2)
Total Protein: 5.1 g/dL — ABNORMAL LOW (ref 6.5–8.1)

## 2020-07-27 LAB — I-STAT ARTERIAL BLOOD GAS, ED
Acid-base deficit: 11 mmol/L — ABNORMAL HIGH (ref 0.0–2.0)
Bicarbonate: 20.5 mmol/L (ref 20.0–28.0)
Calcium, Ion: 1.12 mmol/L — ABNORMAL LOW (ref 1.15–1.40)
HCT: 33 % — ABNORMAL LOW (ref 36.0–46.0)
Hemoglobin: 11.2 g/dL — ABNORMAL LOW (ref 12.0–15.0)
O2 Saturation: 78 %
Patient temperature: 96.9
Potassium: 3.6 mmol/L (ref 3.5–5.1)
Sodium: 140 mmol/L (ref 135–145)
TCO2: 23 mmol/L (ref 22–32)
pCO2 arterial: 71.3 mmHg (ref 32.0–48.0)
pH, Arterial: 7.06 — CL (ref 7.350–7.450)
pO2, Arterial: 58 mmHg — ABNORMAL LOW (ref 83.0–108.0)

## 2020-07-27 LAB — I-STAT BETA HCG BLOOD, ED (MC, WL, AP ONLY): I-stat hCG, quantitative: <5 m[IU]/mL (ref <5)

## 2020-07-27 LAB — SALICYLATE LEVEL: Salicylate Lvl: <7 mg/dL — ABNORMAL LOW (ref 7.0–30.0)

## 2020-07-27 LAB — BRAIN NATRIURETIC PEPTIDE: B Natriuretic Peptide: 841.9 pg/mL — ABNORMAL HIGH (ref 0.0–100.0)

## 2020-07-27 LAB — TROPONIN I (HIGH SENSITIVITY): Troponin I (High Sensitivity): 283 ng/L (ref <18)

## 2020-07-27 LAB — ETHANOL: Alcohol, Ethyl (B): <10 mg/dL (ref <10)

## 2020-07-27 LAB — ACETAMINOPHEN LEVEL: Acetaminophen (Tylenol), Serum: 21 ug/mL (ref 10–30)

## 2020-07-27 LAB — LACTIC ACID, PLASMA: Lactic Acid, Venous: 6.3 mmol/L (ref 0.5–1.9)

## 2020-07-27 MED ORDER — FENTANYL CITRATE (PF) 100 MCG/2ML IJ SOLN
INTRAMUSCULAR | Status: AC
  Administered 2020-07-28: 100 ug via INTRAVENOUS
  Filled 2020-07-27: qty 2

## 2020-07-27 MED ORDER — FENTANYL CITRATE (PF) 100 MCG/2ML IJ SOLN
100.0000 ug | INTRAMUSCULAR | Status: AC | PRN
  Administered 2020-07-31 (×2): 100 ug via INTRAVENOUS
  Filled 2020-07-27 (×2): qty 2

## 2020-07-27 MED ORDER — SODIUM CHLORIDE 0.9 % IV SOLN: INTRAVENOUS | Status: DC

## 2020-07-27 MED ORDER — ASPIRIN 300 MG RE SUPP
300.0000 mg | RECTAL | Status: AC
  Filled 2020-07-27: qty 1

## 2020-07-27 MED ORDER — PANTOPRAZOLE SODIUM 40 MG IV SOLR
40.0000 mg | Freq: Every day | INTRAVENOUS | Status: DC
  Administered 2020-07-28 – 2020-07-29 (×2): 40 mg via INTRAVENOUS
  Filled 2020-07-27 (×2): qty 40

## 2020-07-27 MED ORDER — DOCUSATE SODIUM 50 MG/5ML PO LIQD
100.0000 mg | Freq: Two times a day (BID) | ORAL | Status: DC
  Administered 2020-07-30 – 2020-08-01 (×4): 100 mg
  Filled 2020-07-27 (×4): qty 10

## 2020-07-27 MED ORDER — PROPOFOL 1000 MG/100ML IV EMUL
INTRAVENOUS | Status: AC
  Filled 2020-07-27: qty 100

## 2020-07-27 MED ORDER — PROPOFOL 1000 MG/100ML IV EMUL: 0.0000 ug/kg/min | INTRAVENOUS | Status: DC

## 2020-07-27 MED ORDER — DOCUSATE SODIUM 50 MG/5ML PO LIQD: 100.0000 mg | Freq: Two times a day (BID) | ORAL | Status: DC | PRN

## 2020-07-27 MED ORDER — SODIUM BICARBONATE 8.4 % IV SOLN
100.0000 meq | Freq: Once | INTRAVENOUS | Status: AC
  Administered 2020-07-27: 100 meq via INTRAVENOUS

## 2020-07-27 MED ORDER — MIDAZOLAM HCL 2 MG/2ML IJ SOLN
2.0000 mg | INTRAMUSCULAR | Status: DC | PRN
  Filled 2020-07-27: qty 2

## 2020-07-27 MED ORDER — SODIUM CHLORIDE 0.9 % IV SOLN
250.0000 mL | INTRAVENOUS | Status: DC
  Administered 2020-07-27 – 2020-07-28 (×2): 250 mL via INTRAVENOUS

## 2020-07-27 MED ORDER — SODIUM BICARBONATE 8.4 % IV SOLN
INTRAVENOUS | Status: DC
  Filled 2020-07-27 (×3): qty 850

## 2020-07-27 MED ORDER — POLYETHYLENE GLYCOL 3350 17 G PO PACK
17.0000 g | PACK | Freq: Every day | ORAL | Status: DC
  Filled 2020-07-27: qty 1

## 2020-07-27 MED ORDER — FENTANYL CITRATE (PF) 100 MCG/2ML IJ SOLN
100.0000 ug | INTRAMUSCULAR | Status: DC | PRN
  Administered 2020-07-27 – 2020-08-01 (×6): 100 ug via INTRAVENOUS
  Filled 2020-07-27 (×6): qty 2

## 2020-07-27 MED ORDER — NOREPINEPHRINE 4 MG/250ML-% IV SOLN
2.0000 ug/min | INTRAVENOUS | Status: DC
  Administered 2020-07-27: 5 ug/min via INTRAVENOUS
  Filled 2020-07-27 (×2): qty 250

## 2020-07-27 MED ORDER — POLYETHYLENE GLYCOL 3350 17 G PO PACK: 17.0000 g | PACK | Freq: Every day | ORAL | Status: DC | PRN

## 2020-07-27 NOTE — ED Notes (Signed)
NUMBER 18 OG INSERTED BY ALEXIS RN

## 2020-07-27 NOTE — ED Notes (Signed)
[name], mother, [phone] would like an update when available

## 2020-07-27 NOTE — ED Notes (Signed)
King airway removed  Pt intubTED BY THE ED RESIDENT

## 2020-07-27 NOTE — ED Notes (Signed)
IJNTUBATED BY THE ED RESIDENT.   LEVOPHED DRIP STARTED AT 5  IV

## 2020-07-27 NOTE — ED Notes (Signed)
NUMBER 14 TEMP FOLEY

## 2020-07-27 NOTE — ED Notes (Signed)
7.5 TUBE 24AT TEETH

## 2020-07-27 NOTE — ED Notes (Signed)
Pt arrived 2155 cpr in progress no cbhart no name for 10 minutes   Found down at home  Pt on  [place]  Pt was shocked at  The house  [place] airway  On arrival  Iv rt ac0  epine  [id number] iv  Rt a-c

## 2020-07-27 NOTE — Consult Note (Signed)
Responded to page, conferred with staff, provided spiritual support to pt's brother, upset in Consult A, who says he is very close to his sister and wants to see her, but let her boyfriend go back. A doctor will speak to him after a procedure is done. Pls call if further chaplain services are desired.  Rev. Donnel Saxon Chaplain

## 2020-07-27 NOTE — ED Notes (Addendum)
CODE COOL CALLED BY CRITICAL CARE COOL 36  ICE BAGS PLACED

## 2020-07-27 NOTE — ED Provider Notes (Signed)
[facility] EMERGENCY DEPARTMENT Provider Note   CSN: [id number] Arrival date & time: 2020-08-23  2200     History Chief Complaint  Patient presents with   Cardiac Arrest    Lisa Vazquez is a 34 y.o. female.  Per patient's significant other, when he came home from work, the patient was normal at 10pm, and then he left her alone for 10-50min while he was changing after work, came back into the room where she was and she was unresponsive.  He denies that she had any significant complaints today, had been complaining of some vaginal pain but was actively on her period.  He reports she has not been compliant with her medications nor has been compliant with doctor's appointments lately.  The history is provided by the EMS personnel.  Cardiac Arrest Witnessed by:  Not witnessed Incident location:  Home Condition upon EMS arrival:  Unresponsive and agonal respirations Pulse:  Present Initial cardiac rhythm per EMS:  Ventricular fibrillation Treatments prior to arrival:  ACLS protocol Medications given prior to ED:  Epinephrine Airway:  Bag valve mask and spontaneous respirations Rhythm on admission to ED:  Normal sinus      No past medical history on file.  There are no problems to display for this patient.   OB History   No obstetric history on file.     No family history on file.  Social History   Tobacco Use   Smoking status: Not on file  Substance Use Topics   Alcohol use: Not on file   Drug use: Not on file    Home Medications Prior to Admission medications   Not on File    Allergies    Patient has no allergy information on record.  Review of Systems   Review of Systems  Unable to perform ROS: Patient unresponsive    Physical Exam Updated Vital Signs BP (!) 130/97    Pulse (!) 44    Temp (!) 96.9 F (36.1 C)    Resp 18    Ht 4\' 11"  (1.499 m)    SpO2 (!) 18%   Physical Exam Constitutional:      General: She is in acute  distress.     Appearance: She is ill-appearing.     Interventions: She is intubated.  HENT:     Head: Normocephalic and atraumatic.     Mouth/Throat:     Mouth: Mucous membranes are dry.     Pharynx: Oropharynx is clear.  Cardiovascular:     Rate and Rhythm: Normal rate.     Heart sounds: No murmur heard.  No gallop.   Pulmonary:     Effort: Tachypnea (Intermittent agonal respirations) present. She is intubated.     Breath sounds: Rhonchi and rales present.  Abdominal:     General: There is no distension.     Palpations: Abdomen is soft.  Musculoskeletal:        General: No deformity.  Skin:    General: Skin is warm and dry.     Capillary Refill: Capillary refill takes less than 2 seconds.  Neurological:     Mental Status: She is unresponsive.     GCS: GCS eye subscore is 1. GCS verbal subscore is 1. GCS motor subscore is 1.     ED Results / Procedures / Treatments   Labs (all labs ordered are listed, but only abnormal results are displayed) Labs Reviewed  PROTIME-INR - Abnormal; Notable for the following components:  Result Value   Prothrombin Time 17.9 (*)    INR 1.5 (*)    All other components within normal limits  CBC WITH DIFFERENTIAL/PLATELET - Abnormal; Notable for the following components:   Hemoglobin 10.0 (*)    MCV 72.2 (*)    MCH 19.9 (*)    MCHC 27.5 (*)    RDW 23.4 (*)    Platelets 109 (*)    Lymphs Abs 5.9 (*)    Abs Immature Granulocytes 0.74 (*)    All other components within normal limits  LACTIC ACID, PLASMA - Abnormal; Notable for the following components:   Lactic Acid, Venous 6.3 (*)    All other components within normal limits  BRAIN NATRIURETIC PEPTIDE - Abnormal; Notable for the following components:   B Natriuretic Peptide 841.9 (*)    All other components within normal limits  COMPREHENSIVE METABOLIC PANEL - Abnormal; Notable for the following components:   Potassium 3.1 (*)    CO2 19 (*)    Glucose, Bld 345 (*)    BUN <5 (*)      Creatinine, Ser 1.36 (*)    Calcium 7.7 (*)    Total Protein 5.1 (*)    Albumin 2.5 (*)    AST 118 (*)    ALT 61 (*)    GFR, Estimated 52 (*)    All other components within normal limits  I-STAT ARTERIAL BLOOD GAS, ED - Abnormal; Notable for the following components:   pH, Arterial 7.060 (*)    pCO2 arterial 71.3 (*)    pO2, Arterial 58 (*)    Acid-base deficit 11.0 (*)    Calcium, Ion 1.12 (*)    HCT 33.0 (*)    Hemoglobin 11.2 (*)    All other components within normal limits  TROPONIN I (HIGH SENSITIVITY) - Abnormal; Notable for the following components:   Troponin I (High Sensitivity) 283 (*)    All other components within normal limits  RESPIRATORY PANEL BY RT PCR (FLU A&B, COVID)  LACTIC ACID, PLASMA  SALICYLATE LEVEL  ETHANOL  ACETAMINOPHEN LEVEL  URINALYSIS, ROUTINE W REFLEX MICROSCOPIC  RAPID URINE DRUG SCREEN, HOSP PERFORMED  I-STAT CHEM 8, ED  I-STAT BETA HCG BLOOD, ED ([facility], [facility], [facility] ONLY)  I-STAT VENOUS BLOOD GAS, ED  TYPE AND SCREEN  ABO/RH    EKG None  Radiology DG Chest Portable 1 View  Result Date: 08-12-2020 CLINICAL DATA:  Post CPR.  Found down at home. EXAM: PORTABLE CHEST 1 VIEW COMPARISON:  Most recent radiograph 02/19/2020.  Chest CT 03/08/2017 FINDINGS: Endotracheal tube tip is 3.5 cm from carina. Enteric tube in place with tip in the distal esophagus, recommend advancement of greater than 13 cm to place the side-port below the diaphragm. Cardiomegaly with slight progression. Diffuse lung opacities with interstitial thickening and central air bronchograms, favoring pulmonary edema. There may be an element of airspace disease/consolidation. No evidence of pneumothorax. No large pleural effusion. No acute osseous abnormalities are seen. IMPRESSION: 1. Endotracheal tube tip 3.5 cm from the carina. 2. Enteric tube tip in the distal esophagus, recommend advancement of greater than 13 cm to place the side-port below the diaphragm. 3. Cardiomegaly with pulmonary  edema. Possible superimposed perihilar airspace disease/consolidation with air bronchograms. Electronically Signed   By: Narda Rutherford M.D.   On: 08-12-20 22:43    Procedures Procedure Name: Intubation Date/Time: August 12, 2020 11:13 PM Performed by: Loletha Carrow, MD Pre-anesthesia Checklist: Suction available, Patient being monitored and Patient identified Oxygen Delivery Method: Ambu bag  Induction Type: Cricoid Pressure applied Laryngoscope Size: Glidescope Grade View: Grade I Tube size: 7.5 mm Number of attempts: 2 Airway Equipment and Method: Rigid stylet Placement Confirmation: ETT inserted through vocal cords under direct vision,  Positive ETCO2 and Breath sounds checked- equal and bilateral Secured at: 24 cm Tube secured with: ETT holder      (including critical care time)  Medications Ordered in ED Medications  0.9 %  sodium chloride infusion (250 mLs Intravenous New Bag/Given 08-22-2020 2240)  norepinephrine (LEVOPHED) 4mg  in premix infusion (5 mcg/min Intravenous Rate/Dose Change 2020-08-22 2245)  fentaNYL (SUBLIMAZE) injection 100 mcg (has no administration in time range)  fentaNYL (SUBLIMAZE) injection 100 mcg (100 mcg Intravenous Given Aug 22, 2020 2248)  midazolam (VERSED) injection 2 mg (has no administration in time range)  midazolam (VERSED) injection 2 mg (has no administration in time range)  sodium bicarbonate 150 mEq in dextrose 5 % 1,000 mL infusion (has no administration in time range)  fentaNYL (SUBLIMAZE) 100 MCG/2ML injection (has no administration in time range)  propofol (DIPRIVAN) 1000 MG/100ML infusion (has no administration in time range)  sodium bicarbonate injection 100 mEq (100 mEq Intravenous Given 2020-08-22 2307)    ED Course  I have reviewed the triage vital signs and the nursing notes.  Pertinent labs & imaging results that were available during my care of the patient were reviewed by me and considered in my medical decision making (see  chart for details).    MDM Rules/Calculators/A&P                          The patient is a 34yo female, PMH CHF last EF 20-25% who presents to the ED via EMS for Vfib arrest.  On my initial evaluation, the patient is pulseless with agonal respirations, breaths through LMA, compressions from LUKAS device. Gave epi and performed bedside ultrasound, which showed markedly decreased EF but organized cardiac activity. Patient had dopplerable then palpable peripheral pulses, so stopped compressions and started levophed.   Intubated as documented above, patient tolerated well. Initial VBG with marked respiratory acidosis. Patient noted to have desaturations, so adjusted vent settings to increase PEEP, rate. Provided IV fentanyl infusion for mild sedation since patient is spontaneously breathing.   Unclear etiology of arrest. Due to hx of cardiomyopathy, it is very possible that patient suddenly went into arrhythmia and then into cardiac arrest. CXR with cardiomegaly and pulmonary edema as interpreted by radiology and myself.   Consulted ICU for further evaluation. ICU at bedside, preparing to perform procedures. No further acute events while under my care. Rest of workup pending, patient in critical condition at time of transfer to care of ICU team.  The care of this patient was overseen by Dr. 34yo, who agreed with evaluation and plan of care.   Final Clinical Impression(s) / ED Diagnoses Final diagnoses:  Cardiac arrest [facility])  Ventricular fibrillation [facility])    Rx / DC Orders ED Discharge Orders    None       [facility], MD 08/22/2020 13/05/21    [id number], MD 07/28/20 1515

## 2020-07-27 NOTE — ED Notes (Signed)
Paged Code Cool

## 2020-07-27 NOTE — H&P (Signed)
NAME:  SEILA LISTON, MRN:  [id number], DOB:  1986/06/03, LOS: 1 ADMISSION DATE:  07/29/2020, CONSULTATION DATE:  11/5 REFERRING MD:  EDP, CHIEF COMPLAINT:  Post arrest    Brief History   34yo female with reported hx of peripartum cardiomyopathy with known EF 20%, severe MR presents as cardiac arrest with CPR in progress, VFib arrest per EMS.  Total downtime prior to ROSC ~52mins.    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  Per fiancee she was normal when he returned home from work, then ~15 mins later when he came back into the room from changing clothes she was unresponsive.  Reportedly no c/o prior, in her usual state of health, however fiancee states she was poorly compliant with medication regimen and f/u.   Past Medical History   has no past medical history on file.   Significant Hospital Events     Consults:  Cardiology   Procedures:  ETT 11/5>>> L IJ CVL 11/5>>>  Significant Diagnostic Tests:    Micro Data:  COVID 11/5>>>   Antimicrobials:    Interim history/subjective:  Seen in ER post arrest.  Significant hypoxia, difficult to ventilate and oxygenate. Acidotic.   Objective   Blood pressure 119/90, pulse 75, temperature (!) 96.9 F (36.1 C), resp. rate (!) 26, height 4\' 11"  (1.499 m), SpO2 90 %.    Vent Mode: PCV;CPAP FiO2 (%):  [100 %] 100 % Set Rate:  [20 bmp] 20 bmp Vt Set:  [340 mL] 340 mL PEEP:  [5 cmH20-16 cmH20] 16 cmH20 Pressure Support:  [15 cmH20] 15 cmH20 Plateau Pressure:  [30 cmH20] 30 cmH20  No intake or output data in the 24 hours ending 07/28/20 0009 There were no vitals filed for this visit.  Examination: General: critically ill appearing female HENT: mm moist, ETT with copious pink frothy sputum  Lungs: resps even, labored, dyssynch with vent, coarse throughout  Cardiovascular: s1s2 rrr   Abdomen: soft, +bs  Extremities: cool, 1+ BLE pitting edema  Neuro: poorly responsive post arrest, does not follow commands, breathes over vent rate, some non purposeful movement   Resolved Hospital Problem list     Assessment & Plan:   VFib arrest - in setting known severe MR, cardiomyopathy with previous EF 25% (2020) with poor compliance  PLAN -  Normothermia 36 degree TTM    Severe MR  Cardiomyopathy  Cardiogenic shock  PLAN -  Co-ox  Trend BNP, troponin  Echo  Dobutamine gtt  Diuresis as Scr and BP tol   Acute hypoxic respiratory failure  Respiratory acidosis  PLAN -  Vent support - 8cc/kg  F/u CXR  F/u ABG now Difficult to oxygenate  Bicarb gtt  F/u chem    AMS - post arrest  PLAN -  PAD sedation protocol  Normothermia protocol as above    Best practice:  Diet: NPO Pain/Anxiety/Delirium protocol (if indicated): ordered VAP protocol (if indicated): ordered  DVT prophylaxis: SCD GI prophylaxis: ppi  Glucose control: na  Mobility: BR Code Status: full Family Communication:  Will update brother and fiancee  Disposition:   Labs   CBC: Recent Labs  Lab 08/18/2020 2208 07/29/2020 2309  WBC 9.3  --   NEUTROABS 2.0  --   HGB 10.0* 11.2*  HCT 36.3 33.0*  MCV 72.2*  --   PLT 109*  --  Basic Metabolic Panel: Recent Labs  Lab 29-Jul-2020 2208 07/29/20 2309  NA 138 140  K 3.1* 3.6  CL 104  --   CO2 19*  --   GLUCOSE 345*  --   BUN <5*  --   CREATININE 1.36*  --   CALCIUM 7.7*  --    GFR: CrCl cannot be calculated (Unknown ideal weight.). Recent Labs  Lab 07-29-20 2208  WBC 9.3  LATICACIDVEN 6.3*    Liver Function Tests: Recent Labs  Lab 07/29/20 2208  AST 118*  ALT 61*  ALKPHOS 66  BILITOT 0.4  PROT 5.1*  ALBUMIN 2.5*   No results for input(s): LIPASE, AMYLASE in the last 168 hours. No results for input(s): AMMONIA in the last 168 hours.  ABG    Component Value Date/Time   PHART 7.060 (LL) 2020-07-29 2309   PCO2ART 71.3  (HH) 2020/07/29 2309   PO2ART 58 (L) 2020/07/29 2309   HCO3 20.5 2020-07-29 2309   TCO2 23 07-29-20 2309   ACIDBASEDEF 11.0 (H) July 29, 2020 2309   O2SAT 78.0 July 29, 2020 2309     Coagulation Profile: Recent Labs  Lab 29-Jul-2020 2208  INR 1.5*    Cardiac Enzymes: No results for input(s): CKTOTAL, CKMB, CKMBINDEX, TROPONINI in the last 168 hours.  HbA1C: No results found for: HGBA1C  CBG: No results for input(s): GLUCAP in the last 168 hours.  Review of Systems:   As per HPI obtained from ER staff and family. Pt unable.   Past Medical History  She,  has no past medical history on file.   Surgical History      Social History      Family History   Her family history is not on file.   Allergies No Known Allergies   Home Medications  Prior to Admission medications   Not on File     Critical care time:    Dirk Dress, NP Pulmonary/Critical Care Medicine  07/28/2020  12:09 AM

## 2020-07-27 NOTE — ED Notes (Signed)
PROPOFOL  15 MG  ADDED

## 2020-07-27 NOTE — ED Notes (Signed)
Pulse just barely on arrival 44

## 2020-07-28 ENCOUNTER — Other Ambulatory Visit (HOSPITAL_COMMUNITY): Payer: Medicaid Other

## 2020-07-28 ENCOUNTER — Inpatient Hospital Stay (HOSPITAL_COMMUNITY): Payer: Medicaid Other

## 2020-07-28 ENCOUNTER — Encounter (HOSPITAL_COMMUNITY): Payer: Medicaid Other

## 2020-07-28 DIAGNOSIS — Z515 Encounter for palliative care: Secondary | ICD-10-CM

## 2020-07-28 DIAGNOSIS — I469 Cardiac arrest, cause unspecified: Secondary | ICD-10-CM | POA: Diagnosis not present

## 2020-07-28 DIAGNOSIS — I340 Nonrheumatic mitral (valve) insufficiency: Secondary | ICD-10-CM

## 2020-07-28 DIAGNOSIS — I361 Nonrheumatic tricuspid (valve) insufficiency: Secondary | ICD-10-CM

## 2020-07-28 LAB — COOXEMETRY PANEL
Carboxyhemoglobin: 2.2 % — ABNORMAL HIGH (ref 0.5–1.5)
Carboxyhemoglobin: 1.4 % (ref 0.5–1.5)
Carboxyhemoglobin: 1.4 % (ref 0.5–1.5)
Carboxyhemoglobin: 1.7 % — ABNORMAL HIGH (ref 0.5–1.5)
Methemoglobin: 1.2 % (ref 0.0–1.5)
Methemoglobin: 1.3 % (ref 0.0–1.5)
Methemoglobin: 1.4 % (ref 0.0–1.5)
Methemoglobin: 1.3 % (ref 0.0–1.5)
O2 Saturation: 56 %
O2 Saturation: 80.3 %
O2 Saturation: 85.9 %
O2 Saturation: 71.8 %
Total hemoglobin: 10.2 g/dL — ABNORMAL LOW (ref 12.0–16.0)
Total hemoglobin: 10 g/dL — ABNORMAL LOW (ref 12.0–16.0)
Total hemoglobin: 9.7 g/dL — ABNORMAL LOW (ref 12.0–16.0)
Total hemoglobin: 7.1 g/dL — ABNORMAL LOW (ref 12.0–16.0)

## 2020-07-28 LAB — POCT I-STAT 7, (LYTES, BLD GAS, ICA,H+H)
Acid-Base Excess: 0 mmol/L (ref 0.0–2.0)
Acid-base deficit: 2 mmol/L (ref 0.0–2.0)
Acid-base deficit: 1 mmol/L (ref 0.0–2.0)
Bicarbonate: 27.9 mmol/L (ref 20.0–28.0)
Bicarbonate: 28.4 mmol/L — ABNORMAL HIGH (ref 20.0–28.0)
Bicarbonate: 25.3 mmol/L (ref 20.0–28.0)
Calcium, Ion: 1.12 mmol/L — ABNORMAL LOW (ref 1.15–1.40)
Calcium, Ion: 1.08 mmol/L — ABNORMAL LOW (ref 1.15–1.40)
Calcium, Ion: 1.06 mmol/L — ABNORMAL LOW (ref 1.15–1.40)
HCT: 34 % — ABNORMAL LOW (ref 36.0–46.0)
HCT: 33 % — ABNORMAL LOW (ref 36.0–46.0)
HCT: 34 % — ABNORMAL LOW (ref 36.0–46.0)
Hemoglobin: 11.6 g/dL — ABNORMAL LOW (ref 12.0–15.0)
Hemoglobin: 11.2 g/dL — ABNORMAL LOW (ref 12.0–15.0)
Hemoglobin: 11.6 g/dL — ABNORMAL LOW (ref 12.0–15.0)
O2 Saturation: 73 %
O2 Saturation: 95 %
O2 Saturation: 95 %
Patient temperature: 34.4
Patient temperature: 98.6
Patient temperature: 36.7
Potassium: 2.9 mmol/L — ABNORMAL LOW (ref 3.5–5.1)
Potassium: 3.4 mmol/L — ABNORMAL LOW (ref 3.5–5.1)
Potassium: 4.1 mmol/L (ref 3.5–5.1)
Sodium: 143 mmol/L (ref 135–145)
Sodium: 142 mmol/L (ref 135–145)
Sodium: 141 mmol/L (ref 135–145)
TCO2: 30 mmol/L (ref 22–32)
TCO2: 30 mmol/L (ref 22–32)
TCO2: 27 mmol/L (ref 22–32)
pCO2 arterial: 65.7 mmHg (ref 32.0–48.0)
pCO2 arterial: 63.7 mmHg — ABNORMAL HIGH (ref 32.0–48.0)
pCO2 arterial: 44.6 mmHg (ref 32.0–48.0)
pH, Arterial: 7.222 — ABNORMAL LOW (ref 7.350–7.450)
pH, Arterial: 7.257 — ABNORMAL LOW (ref 7.350–7.450)
pH, Arterial: 7.36 (ref 7.350–7.450)
pO2, Arterial: 41 mmHg — ABNORMAL LOW (ref 83.0–108.0)
pO2, Arterial: 89 mmHg (ref 83.0–108.0)
pO2, Arterial: 81 mmHg — ABNORMAL LOW (ref 83.0–108.0)

## 2020-07-28 LAB — CBC
HCT: 35.3 % — ABNORMAL LOW (ref 36.0–46.0)
Hemoglobin: 9.9 g/dL — ABNORMAL LOW (ref 12.0–15.0)
MCH: 19.5 pg — ABNORMAL LOW (ref 26.0–34.0)
MCHC: 28 g/dL — ABNORMAL LOW (ref 30.0–36.0)
MCV: 69.5 fL — ABNORMAL LOW (ref 80.0–100.0)
Platelets: 182 10*3/uL (ref 150–400)
RBC: 5.08 MIL/uL (ref 3.87–5.11)
RDW: 23.1 % — ABNORMAL HIGH (ref 11.5–15.5)
WBC: 14.1 10*3/uL — ABNORMAL HIGH (ref 4.0–10.5)
nRBC: 0.2 % (ref 0.0–0.2)

## 2020-07-28 LAB — BASIC METABOLIC PANEL
Anion gap: 10 (ref 5–15)
Anion gap: 13 (ref 5–15)
BUN: 5 mg/dL — ABNORMAL LOW (ref 6–20)
BUN: 10 mg/dL (ref 6–20)
CO2: 27 mmol/L (ref 22–32)
CO2: 22 mmol/L (ref 22–32)
Calcium: 7.3 mg/dL — ABNORMAL LOW (ref 8.9–10.3)
Calcium: 7.3 mg/dL — ABNORMAL LOW (ref 8.9–10.3)
Chloride: 104 mmol/L (ref 98–111)
Chloride: 106 mmol/L (ref 98–111)
Creatinine, Ser: 1.59 mg/dL — ABNORMAL HIGH (ref 0.44–1.00)
Creatinine, Ser: 2.42 mg/dL — ABNORMAL HIGH (ref 0.44–1.00)
GFR, Estimated: 43 mL/min — ABNORMAL LOW (ref >60)
GFR, Estimated: 26 mL/min — ABNORMAL LOW (ref >60)
Glucose, Bld: 179 mg/dL — ABNORMAL HIGH (ref 70–99)
Glucose, Bld: 130 mg/dL — ABNORMAL HIGH (ref 70–99)
Potassium: 2.9 mmol/L — ABNORMAL LOW (ref 3.5–5.1)
Potassium: 3.9 mmol/L (ref 3.5–5.1)
Sodium: 141 mmol/L (ref 135–145)
Sodium: 141 mmol/L (ref 135–145)

## 2020-07-28 LAB — URINALYSIS, ROUTINE W REFLEX MICROSCOPIC
Bilirubin Urine: NEGATIVE
Glucose, UA: 150 mg/dL — AB
Ketones, ur: NEGATIVE mg/dL
Leukocytes,Ua: NEGATIVE
Nitrite: NEGATIVE
Protein, ur: 100 mg/dL — AB
RBC / HPF: >50 RBC/hpf — ABNORMAL HIGH (ref 0–5)
Specific Gravity, Urine: 1.008 (ref 1.005–1.030)
pH: 7 (ref 5.0–8.0)

## 2020-07-28 LAB — GLUCOSE, CAPILLARY
Glucose-Capillary: 100 mg/dL — ABNORMAL HIGH (ref 70–99)
Glucose-Capillary: 124 mg/dL — ABNORMAL HIGH (ref 70–99)
Glucose-Capillary: 169 mg/dL — ABNORMAL HIGH (ref 70–99)
Glucose-Capillary: 70 mg/dL (ref 70–99)
Glucose-Capillary: 74 mg/dL (ref 70–99)

## 2020-07-28 LAB — TYPE AND SCREEN
ABO/RH(D): A POS
ABO/RH(D): A POS
Antibody Screen: NEGATIVE
Antibody Screen: NEGATIVE

## 2020-07-28 LAB — RAPID URINE DRUG SCREEN, HOSP PERFORMED
Amphetamines: NOT DETECTED
Barbiturates: NOT DETECTED
Benzodiazepines: NOT DETECTED
Cocaine: NOT DETECTED
Opiates: NOT DETECTED
Tetrahydrocannabinol: NOT DETECTED

## 2020-07-28 LAB — MAGNESIUM: Magnesium: 2.1 mg/dL (ref 1.7–2.4)

## 2020-07-28 LAB — ECHOCARDIOGRAM COMPLETE
Area-P 1/2: 6.6 cm2
Calc EF: 11.6 %
Height: 64 in
MV M vel: 4.49 m/s
MV Peak grad: 80.6 mmHg
Radius: 0.35 cm
S' Lateral: 6.8 cm
Single Plane A2C EF: 16.5 %
Single Plane A4C EF: 8.8 %
Weight: 3022.95 oz

## 2020-07-28 LAB — PHOSPHORUS: Phosphorus: 6.7 mg/dL — ABNORMAL HIGH (ref 2.5–4.6)

## 2020-07-28 LAB — PROTIME-INR
INR: 1.6 — ABNORMAL HIGH (ref 0.8–1.2)
Prothrombin Time: 18.2 seconds — ABNORMAL HIGH (ref 11.4–15.2)

## 2020-07-28 LAB — RESPIRATORY PANEL BY RT PCR (FLU A&B, COVID)
Influenza A by PCR: NEGATIVE
Influenza B by PCR: NEGATIVE
SARS Coronavirus 2 by RT PCR: NEGATIVE

## 2020-07-28 LAB — TROPONIN I (HIGH SENSITIVITY): Troponin I (High Sensitivity): 4399 ng/L (ref <18)

## 2020-07-28 LAB — MRSA PCR SCREENING: MRSA by PCR: NEGATIVE

## 2020-07-28 LAB — HIV ANTIBODY (ROUTINE TESTING W REFLEX): HIV Screen 4th Generation wRfx: NONREACTIVE

## 2020-07-28 LAB — APTT: aPTT: 38 seconds — ABNORMAL HIGH (ref 24–36)

## 2020-07-28 LAB — BRAIN NATRIURETIC PEPTIDE
B Natriuretic Peptide: 431.3 pg/mL — ABNORMAL HIGH (ref 0.0–100.0)
B Natriuretic Peptide: >4500 pg/mL — ABNORMAL HIGH (ref 0.0–100.0)

## 2020-07-28 LAB — TRIGLYCERIDES: Triglycerides: 114 mg/dL (ref <150)

## 2020-07-28 LAB — LACTIC ACID, PLASMA: Lactic Acid, Venous: 3.8 mmol/L (ref 0.5–1.9)

## 2020-07-28 MED ORDER — POTASSIUM CHLORIDE 10 MEQ/50ML IV SOLN
10.0000 meq | INTRAVENOUS | Status: AC
  Administered 2020-07-28 (×4): 10 meq via INTRAVENOUS
  Filled 2020-07-28: qty 50

## 2020-07-28 MED ORDER — NOREPINEPHRINE 16 MG/250ML-% IV SOLN
0.0000 ug/min | INTRAVENOUS | Status: DC
  Administered 2020-07-28: 2 ug/min via INTRAVENOUS
  Filled 2020-07-28: qty 250

## 2020-07-28 MED ORDER — AMIODARONE HCL IN DEXTROSE 360-4.14 MG/200ML-% IV SOLN
60.0000 mg/h | INTRAVENOUS | Status: AC
  Administered 2020-07-28: 60 mg/h via INTRAVENOUS

## 2020-07-28 MED ORDER — MIDAZOLAM BOLUS VIA INFUSION
4.0000 mg | INTRAVENOUS | Status: DC | PRN
  Administered 2020-07-31 (×2): 4 mg via INTRAVENOUS
  Filled 2020-07-28: qty 4

## 2020-07-28 MED ORDER — AMIODARONE HCL IN DEXTROSE 360-4.14 MG/200ML-% IV SOLN
60.0000 mg/h | INTRAVENOUS | Status: DC
  Administered 2020-07-28 (×2): 30 mg/h via INTRAVENOUS
  Administered 2020-07-29: 60 mg/h via INTRAVENOUS
  Administered 2020-07-29: 30 mg/h via INTRAVENOUS
  Administered 2020-07-30 (×3): 60 mg/h via INTRAVENOUS
  Filled 2020-07-28 (×7): qty 200

## 2020-07-28 MED ORDER — VALPROATE SODIUM 500 MG/5ML IV SOLN
500.0000 mg | Freq: Three times a day (TID) | INTRAVENOUS | Status: DC
  Administered 2020-07-29 – 2020-08-01 (×11): 500 mg via INTRAVENOUS
  Filled 2020-07-28 (×13): qty 5

## 2020-07-28 MED ORDER — FENTANYL 2500MCG IN NS 250ML (10MCG/ML) PREMIX INFUSION
0.0000 ug/h | INTRAVENOUS | Status: DC
  Administered 2020-07-28: 50 ug/h via INTRAVENOUS
  Filled 2020-07-28: qty 250

## 2020-07-28 MED ORDER — PROPOFOL 1000 MG/100ML IV EMUL
0.0000 ug/kg/min | INTRAVENOUS | Status: DC
  Administered 2020-07-28: 40 ug/kg/min via INTRAVENOUS
  Filled 2020-07-28: qty 100

## 2020-07-28 MED ORDER — ARTIFICIAL TEARS OPHTHALMIC OINT
1.0000 "application " | TOPICAL_OINTMENT | Freq: Three times a day (TID) | OPHTHALMIC | Status: DC
  Administered 2020-07-28 – 2020-07-31 (×9): 1 via OPHTHALMIC
  Filled 2020-07-28 (×2): qty 3.5

## 2020-07-28 MED ORDER — FUROSEMIDE 10 MG/ML IJ SOLN
10.0000 mg/h | INTRAVENOUS | Status: DC
  Administered 2020-07-28: 10 mg/h via INTRAVENOUS
  Administered 2020-07-28: 4 mg/h via INTRAVENOUS
  Administered 2020-07-29: 10 mg/h via INTRAVENOUS
  Filled 2020-07-28 (×4): qty 20

## 2020-07-28 MED ORDER — FENTANYL BOLUS VIA INFUSION
50.0000 ug | INTRAVENOUS | Status: DC | PRN
  Filled 2020-07-28: qty 50

## 2020-07-28 MED ORDER — MIDAZOLAM 50MG/50ML (1MG/ML) PREMIX INFUSION
0.5000 mg/h | INTRAVENOUS | Status: DC
  Administered 2020-07-28: 11 mg/h via INTRAVENOUS
  Administered 2020-07-28: 10 mg/h via INTRAVENOUS
  Filled 2020-07-28 (×3): qty 50

## 2020-07-28 MED ORDER — VALPROATE SODIUM 500 MG/5ML IV SOLN
1000.0000 mg | Freq: Once | INTRAVENOUS | Status: AC
  Administered 2020-07-28: 1000 mg via INTRAVENOUS
  Filled 2020-07-28: qty 10

## 2020-07-28 MED ORDER — SODIUM CHLORIDE 0.9% FLUSH
10.0000 mL | Freq: Two times a day (BID) | INTRAVENOUS | Status: DC
  Administered 2020-07-28 – 2020-07-31 (×5): 10 mL
  Administered 2020-08-01: 30 mL

## 2020-07-28 MED ORDER — PERFLUTREN LIPID MICROSPHERE
1.0000 mL | INTRAVENOUS | Status: AC | PRN
  Administered 2020-07-28: 2 mL via INTRAVENOUS
  Filled 2020-07-28: qty 10

## 2020-07-28 MED ORDER — ORAL CARE MOUTH RINSE
15.0000 mL | OROMUCOSAL | Status: DC
  Administered 2020-07-28 – 2020-08-01 (×40): 15 mL via OROMUCOSAL

## 2020-07-28 MED ORDER — FENTANYL CITRATE (PF) 100 MCG/2ML IJ SOLN: 50.0000 ug | Freq: Once | INTRAMUSCULAR | Status: DC

## 2020-07-28 MED ORDER — AMIODARONE HCL IN DEXTROSE 360-4.14 MG/200ML-% IV SOLN
INTRAVENOUS | Status: AC
  Administered 2020-07-28: 60 mg/h via INTRAVENOUS
  Filled 2020-07-28: qty 200

## 2020-07-28 MED ORDER — POTASSIUM CHLORIDE 20 MEQ PO PACK
40.0000 meq | PACK | Freq: Three times a day (TID) | ORAL | Status: AC
  Administered 2020-07-28 (×2): 40 meq via ORAL
  Filled 2020-07-28 (×2): qty 2

## 2020-07-28 MED ORDER — VECURONIUM BROMIDE 10 MG IV SOLR
0.1000 mg/kg | INTRAVENOUS | Status: DC | PRN
  Administered 2020-07-28: 8.6 mg via INTRAVENOUS
  Filled 2020-07-28: qty 10

## 2020-07-28 MED ORDER — CHLORHEXIDINE GLUCONATE 0.12% ORAL RINSE (MEDLINE KIT)
15.0000 mL | Freq: Two times a day (BID) | OROMUCOSAL | Status: DC
  Administered 2020-07-28 – 2020-08-01 (×9): 15 mL via OROMUCOSAL

## 2020-07-28 MED ORDER — MILRINONE LACTATE IN DEXTROSE 20-5 MG/100ML-% IV SOLN
0.2500 ug/kg/min | INTRAVENOUS | Status: DC
  Administered 2020-07-28: 0.25 ug/kg/min via INTRAVENOUS

## 2020-07-28 MED ORDER — POTASSIUM CHLORIDE 20 MEQ PO PACK
40.0000 meq | PACK | ORAL | Status: AC
  Administered 2020-07-28 (×2): 40 meq
  Filled 2020-07-28 (×2): qty 2

## 2020-07-28 MED ORDER — SODIUM CHLORIDE 0.9 % IV SOLN
0.5000 mg/h | INTRAVENOUS | Status: DC
  Administered 2020-07-28: 26 mg/h via INTRAVENOUS
  Administered 2020-07-28: 50 mg/h via INTRAVENOUS
  Administered 2020-07-29: 60 mg/h via INTRAVENOUS
  Administered 2020-07-29: 50 mg/h via INTRAVENOUS
  Administered 2020-07-29: 60 mg/h via INTRAVENOUS
  Administered 2020-07-30: 35 mg/h via INTRAVENOUS
  Administered 2020-07-30: 60 mg/h via INTRAVENOUS
  Administered 2020-07-31: 55 mg/h via INTRAVENOUS
  Filled 2020-07-28: qty 50
  Filled 2020-07-28: qty 100
  Filled 2020-07-28: qty 50
  Filled 2020-07-28 (×7): qty 100
  Filled 2020-07-28: qty 50
  Filled 2020-07-28: qty 100
  Filled 2020-07-28: qty 50
  Filled 2020-07-28 (×2): qty 100

## 2020-07-28 MED ORDER — LEVETIRACETAM IN NACL 1000 MG/100ML IV SOLN
1000.0000 mg | Freq: Two times a day (BID) | INTRAVENOUS | Status: DC
  Administered 2020-07-28 – 2020-07-29 (×3): 1000 mg via INTRAVENOUS
  Filled 2020-07-28 (×3): qty 100

## 2020-07-28 MED ORDER — MAGNESIUM SULFATE 2 GM/50ML IV SOLN
2.0000 g | Freq: Once | INTRAVENOUS | Status: AC
  Administered 2020-07-28: 2 g via INTRAVENOUS
  Filled 2020-07-28: qty 50

## 2020-07-28 MED ORDER — SODIUM CHLORIDE 0.9 % IV SOLN
100.0000 mg | Freq: Two times a day (BID) | INTRAVENOUS | Status: DC
  Administered 2020-07-28 – 2020-07-31 (×7): 100 mg via INTRAVENOUS
  Filled 2020-07-28 (×9): qty 10

## 2020-07-28 MED ORDER — SODIUM CHLORIDE 0.9% FLUSH: 10.0000 mL | INTRAVENOUS | Status: DC | PRN

## 2020-07-28 MED ORDER — CHLORHEXIDINE GLUCONATE CLOTH 2 % EX PADS
6.0000 | MEDICATED_PAD | Freq: Every day | CUTANEOUS | Status: DC
  Administered 2020-07-28 – 2020-07-29 (×2): 6 via TOPICAL

## 2020-07-28 MED ORDER — AMIODARONE LOAD VIA INFUSION
150.0000 mg | Freq: Once | INTRAVENOUS | Status: AC
  Administered 2020-07-28: 150 mg via INTRAVENOUS

## 2020-07-28 MED ORDER — POTASSIUM CHLORIDE CRYS ER 20 MEQ PO TBCR: 40.0000 meq | EXTENDED_RELEASE_TABLET | ORAL | Status: DC

## 2020-07-28 MED ORDER — AMIODARONE LOAD VIA INFUSION
150.0000 mg | Freq: Once | INTRAVENOUS | Status: DC | PRN
  Filled 2020-07-28: qty 83.34

## 2020-07-28 NOTE — Plan of Care (Signed)
  Problem: Clinical Measurements: Goal: Ability to maintain clinical measurements within normal limits will improve Outcome: Progressing Goal: Will remain free from infection Outcome: Progressing   Problem: Nutrition: Goal: Adequate nutrition will be maintained Outcome: Not Progressing   Problem: Elimination: Goal: Will not experience complications related to urinary retention Outcome: Progressing   Problem: Neurologic: Goal: Promote progressive neurologic recovery Outcome: Not Progressing

## 2020-07-28 NOTE — Progress Notes (Signed)
  Echocardiogram 2D Echocardiogram has been performed.  Burnard Hawthorne 07/28/2020, 3:34 PM

## 2020-07-28 NOTE — Procedures (Signed)
Arterial Catheter Insertion Procedure Note  WYNETTE JERSEY  [id number]  08/23/1986  Date:07/28/20  Time:4:24 AM    Provider Performing: Leafy Half    Procedure: Insertion of Arterial Line (49675) with US guidance (91638)   Indication(s) Blood pressure monitoring and/or need for frequent ABGs  Consent Unable to obtain consent due to emergent nature of procedure.  Anesthesia None   Time Out Verified patient identification, verified procedure, site/side was marked, verified correct patient position, special equipment/implants available, medications/allergies/relevant history reviewed, required imaging and test results available.   Sterile Technique Maximal sterile technique including full sterile barrier drape, hand hygiene, sterile gown, sterile gloves, mask, hair covering, sterile ultrasound probe cover (if used).   Procedure Description Area of catheter insertion was cleaned with chlorhexidine and draped in sterile fashion. With real-time ultrasound guidance an arterial catheter was placed into the left radial artery.  Appropriate arterial tracings confirmed on monitor.     Complications/Tolerance None; patient tolerated the procedure well.   EBL Minimal   Specimen(s) None

## 2020-07-28 NOTE — ED Notes (Signed)
REPORT GIVEN TO RN ON 2H

## 2020-07-28 NOTE — Procedures (Signed)
Central Venous Catheter Insertion Procedure Note  RAHMAH MCCAMY  [id number]  1985-12-09  Date:07/28/20  Time:12:09 AM   Provider Performing:Adenike Shidler   Procedure: Insertion of Non-tunneled Central Venous Catheter(36556) with US guidance ([id number])   Indication(s) Medication administration and Difficult access  Consent Unable to obtain consent due to emergent nature of procedure.  Anesthesia Topical only with 1% lidocaine   Timeout Verified patient identification, verified procedure, site/side was marked, verified correct patient position, special equipment/implants available, medications/allergies/relevant history reviewed, required imaging and test results available.  Sterile Technique Maximal sterile technique including full sterile barrier drape, hand hygiene, sterile gown, sterile gloves, mask, hair covering, sterile ultrasound probe cover (if used).  Procedure Description Area of catheter insertion was cleaned with chlorhexidine and draped in sterile fashion.  With real-time ultrasound guidance a central venous catheter was placed into the left internal jugular vein. Nonpulsatile blood flow and easy flushing noted in all ports.  The catheter was sutured in place and sterile dressing applied.  Complications/Tolerance None; patient tolerated the procedure well. Chest X-ray is ordered to verify placement for internal jugular or subclavian cannulation.   Chest x-ray is not ordered for femoral cannulation.  EBL Minimal  Specimen(s) None  Performed under direct MD supervision.  Performed using ultrasound guidance.  Wire visualized in vessel under ultrasound.

## 2020-07-28 NOTE — ED Notes (Signed)
PROPOFOL DOWN TO 20  BP LOWER

## 2020-07-28 NOTE — Progress Notes (Signed)
Met with fiance and brother, they are in shock with news of how poorly she is doing and need time to process.  They are currently at bedside.  Erskine Emery MD PCCM

## 2020-07-28 NOTE — Progress Notes (Addendum)
Palliative Medicine  I met with patient's brother at bedside.  Together, we discussed patient's multiple medical problems.  He seems to understand that she is critically ill and may not survive this hospitalization.  He feels family would want to continue the current scope of treatment for now.  Emotional support was provided.  Will have palliative care team continue to follow.  Plan: -Continue current scope of treatment -Chaplain consult -Discussed [employer] support for patient's children -Will follow  Time Total: 20 minutes  Visit consisted of counseling and education dealing with the complex and emotionally intense issues of symptom management and palliative care in the setting of serious and potentially life-threatening illness.Greater than 50%  of this time was spent counseling and coordinating care related to the above assessment and plan.  Signed by: Altha Harm, PhD, NP-C

## 2020-07-28 NOTE — Progress Notes (Signed)
Torsades, shocked, giving mag and starting amiodarone, family remains at bedside, full code, full scope of care for now. Still asynchronous with ventilator.  May try paralytic to reduce intrathoracic pressure swings causing further electrical instability.  Myrla Halsted MD PCCM

## 2020-07-28 NOTE — Progress Notes (Signed)
EEG complete - results pending 

## 2020-07-28 NOTE — ED Notes (Signed)
REPORT GIVEN TO RN ON 2H  GOING TO C-T  LARGE YELLOW STOOL CLEANED FROM THE PT

## 2020-07-28 NOTE — Progress Notes (Signed)
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  Total downtime prior to ROSC ~32mins.    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  Per fiancee she was normal when he returned home from work, then ~15 mins later when he came back into the room from changing clothes she was unresponsive.  Reportedly no c/o prior, in her usual state of health, however fiancee states she was poorly compliant with medication regimen and f/u.   Past Medical History   has no past medical history on file.   Significant Hospital Events   Admission to ICU following cardiac arrest.   Consults:  Cardiology   Procedures:  ETT 11/5>>> L IJ CVL 11/5>>>  Significant Diagnostic Tests:    Micro Data:  COVID 11/5>>> negative   Antimicrobials:    Interim history/subjective:  In ICU on vasopressors, continues to have myoclonus.    Objective   Blood pressure (!) 93/59, pulse 78, temperature (!) 95.5 F (35.3 C), resp. rate (!) 25, height 5\' 4"  (1.626 m), weight 85.7 kg, SpO2 100 %. CVP:  [7 mmHg-23 mmHg] 11 mmHg  Vent Mode: PRVC FiO2 (%):  [100 %] 100 % Set Rate:  [20 bmp-25 bmp] 25 bmp Vt Set:  [340 mL-450 mL] 450 mL PEEP:  [5 cmH20-16 cmH20] 16 cmH20 Pressure Support:  [15 cmH20] 15 cmH20 Plateau Pressure:  [27 cmH20-30 cmH20] 27 cmH20   Intake/Output Summary (Last 24 hours) at 07/28/2020 13/02/2020 Last data filed at 07/28/2020 0800 Gross per 24 hour  Intake 1688.57 ml  Output 400 ml  Net 1288.57 ml   Filed Weights   07/28/20 0100  Weight: 85.7 kg     Examination: General: critically ill appearing female, slender build.13/06/21  HENT: mm moist, ETT with copious pink frothy sputum  Lungs: no ventilator dyssynchrony. Rhonchi bilaterally Cardiovascular: S1/S2 are distant. 3/6 holosystolic murmur head into axilla.  Abdomen: soft, +bs  Extremities: cool, 1+ BLE pitting edema  Neuro: eye flickering, worse with stimulation.  Sedated with no movement.  POC echo confirms dilated LV with EF 25% with severe functional MR.   Resolved Hospital Problem list     Assessment & Plan:   VFib arrest - in setting known severe MR, cardiomyopathy with previous EF 25% (2020) with poor compliance - on amiodarone  Cardiogenic and distributive shock requiring titration of milrinone and norepinephrine due to acute decompensated HFrEF Acute hypoxic and hypercarbic respiratory failure due to acute pulmonary edema Hypoxic ischemic encephalopathy with myoclonus.   Plan:  - Continue amiodarone  - Wean milrinone to target ScvO2 >65% - Tritrate NE to MAP >65% - Full ventilatory support - increase respiratory rate to clear CO2 post arrest. - Continue furosemide infusion to manage pulmonary edema - cEEG and titrate midazolam to seizure suppression. Started valproate.   Prognosis is poor given likelihood of anoxic encephalopathy, decompensated HF with cardiogenic shock which represents the natural history of untreated HF. Non-adherence precludes mechanical support or other advanced therapies.    Best practice:  Diet: NPO -  Pain/Anxiety/Delirium protocol (if indicated):  titrate to seizure control. VAP protocol (if indicated): ordered  DVT prophylaxis: SCD GI prophylaxis: ppi  Glucose control: na  Mobility: BR Code Status: full Family Communication:  Palliative care consultation Disposition: ICU  Labs   CBC: Recent Labs  Lab Jul 30, 2020 2208 07-30-2020 2309 07/28/20 0115 07/28/20 0158 07/28/20 0422  WBC 9.3  --  14.1*  --   --   NEUTROABS 2.0  --   --    --   --   HGB 10.0* 11.2* 9.9* 11.6* 11.2*  HCT 36.3 33.0* 35.3* 34.0* 33.0*  MCV 72.2*  --  69.5*  --   --   PLT 109*  --  182  --   --     Basic Metabolic Panel: Recent Labs  Lab 07-30-20 2208 07-30-2020 2309 07/28/20 0115 07/28/20 0158 07/28/20 0422  NA 138 140 141 143 142  K 3.1* 3.6 2.9* 2.9* 3.4*  CL 104  --  104  --   --   CO2 19*  --  27  --   --   GLUCOSE 345*  --  179*  --   --   BUN <5*  --  5*  --   --   CREATININE 1.36*  --  1.59*  --   --   CALCIUM 7.7*  --  7.3*  --   --   MG  --   --  2.1  --   --   PHOS  --   --  6.7*  --   --    GFR: Estimated Creatinine Clearance: 52.3 mL/min (A) (by C-G formula based on SCr of 1.59 mg/dL (H)). Recent Labs  Lab 2020/07/30 2208 07/28/20 0115  WBC 9.3 14.1*  LATICACIDVEN 6.3* 3.8*    Liver Function Tests: Recent Labs  Lab 07/30/20 2208  AST 118*  ALT 61*  ALKPHOS 66  BILITOT 0.4  PROT 5.1*  ALBUMIN 2.5*   No results for input(s): LIPASE, AMYLASE in the last 168 hours. No results for input(s): AMMONIA in the last 168 hours.  ABG    Component Value Date/Time   PHART 7.257 (L) 07/28/2020 0422   PCO2ART 63.7 (H) 07/28/2020 0422   PO2ART 89 07/28/2020 0422   HCO3 28.4 (H) 07/28/2020 0422   TCO2 30 07/28/2020 0422   ACIDBASEDEF 2.0 07/28/2020 0158   O2SAT 95.0 07/28/2020 0422     Coagulation Profile: Recent Labs  Lab 07/30/20 2208 07/28/20 0115  INR 1.5* 1.6*    Cardiac Enzymes: No results for input(s): CKTOTAL, CKMB, CKMBINDEX, TROPONINI in the last 168 hours.  HbA1C: No results found for: HGBA1C  CBG: Recent Labs  Lab 07/28/20 0800  GLUCAP 169*   CRITICAL CARE Performed by: Lynnell Catalan   Total critical care time: 50 minutes  Critical care time was exclusive of separately billable procedures and treating other patients.  Critical care was necessary to treat or prevent imminent or life-threatening deterioration.  Critical care was time spent personally by me on the following  activities: development of treatment plan with patient and/or surrogate as well as nursing, discussions with consultants, evaluation of patient's response to treatment, examination of patient, obtaining history from patient or surrogate, ordering and performing treatments and interventions, ordering and review of laboratory studies, ordering and review of radiographic studies, pulse oximetry, re-evaluation of patient's condition and participation in multidisciplinary rounds.  Lynnell Catalan, MD [facility] Behavioral ICU Physician [facility]  Critical Care  Pager: [phone] Mobile: [phone] After hours: [phone].

## 2020-07-28 NOTE — Progress Notes (Signed)
LTM EEG hooked up and running - no initial skin breakdown - push button tested - neuro notified.  

## 2020-07-28 NOTE — ED Notes (Signed)
Cristal Deer, brother, [phone] would like an update when available

## 2020-07-28 NOTE — Consult Note (Addendum)
Palliative Medicine   Name: Lisa Vazquez Date: 07/28/2020 MRN: [id number]  DOB: [birth year]  Patient Care Team: Patient, No Pcp Per as PCP - General (General Practice)    REASON FOR CONSULTATION: Lisa Vazquez is an unfortunate 34 y.o. female with multiple medical problems including peripartum cardiomyopathy with EF of 20% and severe MR, who had reportedly been recently noncompliant with medical care. She was admitted to the hospital on 08/11/2020 with cardiac arrest after being found unresponsive by fianc at home.  Total downtime is estimated to be around 30 minutes prior to ROSC.  Patient is critically ill with multiorgan failure and clinical exam concerning for anoxic injury.  Palliative care was consulted help address goals.  SOCIAL HISTORY:     Patient lives at home with her fianc.  She reportedly has 5 children (between ages of 57 and 1).  She has a mother and two brothers who are involved.  ADVANCE DIRECTIVES:  Does not have  CODE STATUS: Full code  PAST MEDICAL HISTORY:No past medical history on file.  PAST SURGICAL HISTORY: No past surgical history on file  HEMATOLOGY/ONCOLOGY HISTORY:  Oncology History   No history exists.    ALLERGIES:  has No Known Allergies.  MEDICATIONS:  Current Facility-Administered Medications  Medication Dose Route Frequency Provider Last Rate Last Admin   0.9 %  sodium chloride infusion  250 mL Intravenous Continuous Sherwood Gambler, MD 20 mL/hr at 07/28/20 0220 250 mL at 07/28/20 0220   0.9 %  sodium chloride infusion   Intravenous Continuous Marijean Heath, NP 10 mL/hr at 07/28/20 1000 Rate Verify at 07/28/20 1000   amiodarone (NEXTERONE PREMIX) 360-4.14 MG/200ML-% (1.8 mg/mL) IV infusion  30 mg/hr Intravenous Continuous Candee Furbish, MD 16.67 mL/hr at 07/28/20 1036 30 mg/hr at 07/28/20 1036   amiodarone (NEXTERONE) 1.8 mg/mL load via infusion 150 mg  150 mg Intravenous Once PRN Candee Furbish, MD        artificial tears (LACRILUBE) ophthalmic ointment 1 application  1 application Both Eyes X4D Whiteheart, Cristal Ford, NP       aspirin suppository 300 mg  300 mg Rectal NOW Whiteheart, Cristal Ford, NP       chlorhexidine gluconate (MEDLINE KIT) (PERIDEX) 0.12 % solution 15 mL  15 mL Mouth Rinse BID Candee Furbish, MD   15 mL at 07/28/20 0801   Chlorhexidine Gluconate Cloth 2 % PADS 6 each  6 each Topical Daily Candee Furbish, MD       docusate (COLACE) 50 MG/5ML liquid 100 mg  100 mg Per Tube BID PRN Whiteheart, Cristal Ford, NP       docusate (COLACE) 50 MG/5ML liquid 100 mg  100 mg Per Tube BID Darlina Sicilian A, NP       fentaNYL (SUBLIMAZE) bolus via infusion 50 mcg  50 mcg Intravenous Q15 min PRN Whiteheart, Cristal Ford, NP       fentaNYL (SUBLIMAZE) injection 100 mcg  100 mcg Intravenous Q15 min PRN Sherwood Gambler, MD   100 mcg at 07/28/20 0217   fentaNYL (SUBLIMAZE) injection 100 mcg  100 mcg Intravenous Q2H PRN Sherwood Gambler, MD   100 mcg at 07/26/2020 2248   fentaNYL (SUBLIMAZE) injection 50 mcg  50 mcg Intravenous Once Darlina Sicilian A, NP       fentaNYL 2521mg in NS 2525m(1066mml) infusion-PREMIX  50-300 mcg/hr Intravenous Continuous WhiDarlina Sicilian NP 5 mL/hr at 07/28/20 1000 50 mcg/hr at 07/28/20 1000   furosemide (  LASIX) 200 mg in dextrose 5 % 100 mL (2 mg/mL) infusion  10 mg/hr Intravenous Continuous Candee Furbish, MD 5 mL/hr at 07/28/20 1000 10 mg/hr at 07/28/20 1000   levETIRAcetam (KEPPRA) IVPB 1000 mg/100 mL premix  1,000 mg Intravenous Q12H Kipp Brood, MD   Stopped at 07/28/20 0933   MEDLINE mouth rinse  15 mL Mouth Rinse 10 times per day Candee Furbish, MD   15 mL at 07/28/20 1017   midazolam (VERSED) 50 mg/50 mL (1 mg/mL) premix infusion  0.5-30 mg/hr Intravenous Continuous Kipp Brood, MD 10 mL/hr at 07/28/20 1000 10 mg/hr at 07/28/20 1000   midazolam (VERSED) bolus via infusion 4 mg  4 mg Intravenous Q15 min PRN Kipp Brood, MD        milrinone (PRIMACOR) 20 MG/100 ML (0.2 mg/mL) infusion  0.25 mcg/kg/min Intravenous Continuous Marijean Heath, NP 6.43 mL/hr at 07/28/20 0224 0.25 mcg/kg/min at 07/28/20 0224   norepinephrine (LEVOPHED) 16 mg in 2105m premix infusion  0-40 mcg/min Intravenous Titrated SCandee Furbish MD 21.6 mL/hr at 07/28/20 1000 23 mcg/min at 07/28/20 1000   pantoprazole (PROTONIX) injection 40 mg  40 mg Intravenous Daily WDarlina SicilianA, NP   40 mg at 07/28/20 0919   polyethylene glycol (MIRALAX / GLYCOLAX) packet 17 g  17 g Per Tube Daily PRN Whiteheart, KCristal Ford NP       polyethylene glycol (MIRALAX / GLYCOLAX) packet 17 g  17 g Per Tube Daily Whiteheart, Kathryn A, NP       propofol (DIPRIVAN) 1000 MG/100ML infusion  0-50 mcg/kg/min Intravenous Continuous SCandee Furbish MD 10.28 mL/hr at 07/28/20 1000 20 mcg/kg/min at 07/28/20 1000   sodium chloride flush (NS) 0.9 % injection 10-40 mL  10-40 mL Intracatheter Q12H SCandee Furbish MD   10 mL at 07/28/20 0910   sodium chloride flush (NS) 0.9 % injection 10-40 mL  10-40 mL Intracatheter PRN SCandee Furbish MD       vecuronium (NORCURON) injection 8.6 mg  0.1 mg/kg Intravenous Q1H PRN WMarijean Heath NP   8.6 mg at 07/28/20 0218    VITAL SIGNS: BP 100/65    Pulse 76    Temp (!) 96.1 F (35.6 C)    Resp (!) 27    Ht '5\' 4"'  (1.626 m)    Wt 188 lb 15 oz (85.7 kg)    SpO2 100%    BMI 32.43 kg/m  Filed Weights   07/28/20 0100  Weight: 188 lb 15 oz (85.7 kg)    Estimated body mass index is 32.43 kg/m as calculated from the following:   Height as of this encounter: '5\' 4"'  (1.626 m).   Weight as of this encounter: 188 lb 15 oz (85.7 kg).  LABS: CBC:    Component Value Date/Time   WBC 14.1 (H) 07/28/2020 0115   HGB 11.2 (L) 07/28/2020 0422   HCT 33.0 (L) 07/28/2020 0422   PLT 182 07/28/2020 0115   MCV 69.5 (L) 07/28/2020 0115   NEUTROABS 2.0 08/20/2020 2208   LYMPHSABS 5.9 (H) 08/11/2020 2208   MONOABS 0.5 08/08/2020 2208     EOSABS 0.1 08/02/2020 2208   BASOSABS 0.1 08/13/2020 2208   Comprehensive Metabolic Panel:    Component Value Date/Time   NA 142 07/28/2020 0422   K 3.4 (L) 07/28/2020 0422   CL 104 07/28/2020 0115   CO2 27 07/28/2020 0115   BUN 5 (L) 07/28/2020 0115   CREATININE 1.59 (H) 07/28/2020 0115  GLUCOSE 179 (H) 07/28/2020 0115   CALCIUM 7.3 (L) 07/28/2020 0115   AST 118 (H) 07/26/2020 2208   ALT 61 (H) 08/20/2020 2208   ALKPHOS 66 07/30/2020 2208   BILITOT 0.4 08/06/2020 2208   PROT 5.1 (L) 08/19/2020 2208   ALBUMIN 2.5 (L) 08/08/2020 2208    RADIOGRAPHIC STUDIES: CT Head Wo Contrast  Result Date: 07/28/2020 CLINICAL DATA:  Cerebral hemorrhage suspected EXAM: CT HEAD WITHOUT CONTRAST TECHNIQUE: Contiguous axial images were obtained from the base of the skull through the vertex without intravenous contrast. COMPARISON:  None. FINDINGS: Brain: No acute intracranial abnormality. Specifically, no hemorrhage, hydrocephalus, mass lesion, acute infarction, or significant intracranial injury. Vascular: No hyperdense vessel or unexpected calcification. Skull: No acute calvarial abnormality. Sinuses/Orbits: Visualized paranasal sinuses and mastoids clear. Orbital soft tissues unremarkable. Other: None IMPRESSION: No intracranial abnormality. Electronically Signed   By: Rolm Baptise M.D.   On: 07/28/2020 01:07   DG Chest Portable 1 View  Result Date: 08/05/2020 CLINICAL DATA:  Central line placement. EXAM: PORTABLE CHEST 1 VIEW COMPARISON:  Earlier today. FINDINGS: Left internal jugular central venous catheter tip projects over the upper SVC. No pneumothorax. Enteric tube tip 3 cm from the carina. The enteric tube tip remains in the distal esophagus. Stable cardiomegaly. Stable diffuse bilateral lung opacities and central air bronchograms. No large pleural effusion. IMPRESSION: 1. Left internal jugular central venous catheter tip projects over the upper SVC. No pneumothorax. 2. Enteric tube tip  remains in the distal esophagus. Recommend advancement of greater than 10 cm. Endotracheal tube in place. 3. Stable diffuse bilateral lung opacities suspicious for pulmonary edema and possible airspace disease. Stable cardiomegaly. Electronically Signed   By: Keith Rake M.D.   On: 07/31/2020 23:51   DG Chest Portable 1 View  Result Date: 07/26/2020 CLINICAL DATA:  Post CPR.  Found down at home. EXAM: PORTABLE CHEST 1 VIEW COMPARISON:  Most recent radiograph 02/19/2020.  Chest CT 03/08/2017 FINDINGS: Endotracheal tube tip is 3.5 cm from carina. Enteric tube in place with tip in the distal esophagus, recommend advancement of greater than 13 cm to place the side-port below the diaphragm. Cardiomegaly with slight progression. Diffuse lung opacities with interstitial thickening and central air bronchograms, favoring pulmonary edema. There may be an element of airspace disease/consolidation. No evidence of pneumothorax. No large pleural effusion. No acute osseous abnormalities are seen. IMPRESSION: 1. Endotracheal tube tip 3.5 cm from the carina. 2. Enteric tube tip in the distal esophagus, recommend advancement of greater than 13 cm to place the side-port below the diaphragm. 3. Cardiomegaly with pulmonary edema. Possible superimposed perihilar airspace disease/consolidation with air bronchograms. Electronically Signed   By: Keith Rake M.D.   On: 08/14/2020 22:43    PERFORMANCE STATUS (ECOG) : 4 - Bedbound  Review of Systems Unable to provide  Physical Exam General: Critically ill-appearing Cardiovascular: regular rate and rhythm Pulmonary: Dyssynchronous on vent Abdomen: soft, nontender, + bowel sounds GU: Foley Skin: no rashes Neurological: Sedated  IMPRESSION: Patient is critically ill-appearing on vent in ICU.  She is requiring multiple pressors.  Patient is currently having EEG due to concerns for anoxic injury given the prolonged downtime.  Case discussed with CCM physician.   Prognosis is felt to be extremely poor.  CCM met last night with patient's family.  No family currently present.  I attempted to call patient's mother but was unable to reach her.  We will have RN notify us if family arrive at the hospital.  PLAN: -Continue current  scope of treatment -Will arrange family meeting    Time Total: 60 minutes  Visit consisted of counseling and education dealing with the complex and emotionally intense issues of symptom management and palliative care in the setting of serious and potentially life-threatening illness.Greater than 50%  of this time was spent counseling and coordinating care related to the above assessment and plan.  Signed by: Altha Harm, PhD, NP-C

## 2020-07-28 NOTE — Progress Notes (Signed)
eLink Physician-Brief Progress Note Patient Name: Lisa Vazquez DOB: 04/02/1986 MRN: [id number]   Date of Service  07/28/2020  HPI/Events of Note  Patient admitted with out of hospital cardiac arrest and coma in the context of advanced dilated cardiomyopathy (idiopathic). She is intubated and on the ventilator. On arrival to the unit she went into [place], lost her pulse  and had to be shocked, her neurologic exam is consistent with severe anoxic brain injury.  eICU Interventions  New Patient Evaluation completed, Mag sulfate 2 gm iv ordered for Torsade, EKG ordered to check QT interval, and labs ordered. PCCM ground crew is en-route to the room.        Thomasene Lot Nasser Ku 07/28/2020, 1:39 AM

## 2020-07-28 NOTE — ED Notes (Signed)
PROPOFOL INCREASED TO 25MG 

## 2020-07-28 NOTE — Progress Notes (Signed)
Assisted tele visit to patient with family members.  Daulton Harbaugh Ann, RN  

## 2020-07-28 NOTE — Procedures (Signed)
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  Total downtime prior to ROSC ~68mins.  EEG to evaluate for seizure.  Level of alertness:  comatose  AEDs during EEG study: LEV, Propofol  Technical aspects: This EEG study was done with scalp electrodes positioned according to the 10-20 International system of electrode placement. Electrical activity was acquired at a sampling rate of 500Hz  and reviewed with a high frequency filter of 70Hz  and a low frequency filter of 1Hz . EEG data were recorded continuously and digitally stored.   Description: EEG showed burst suppression with bursts of highly epileptiform discharges lasting 0.5-4 seconds and eeg suppression lasting 4-11 seconds.  Hyperventilation and photic stimulation were not performed.     ABNORMALITY - Burst suppression with highly epileptiform discharges, generalized  IMPRESSION: This study showed evidence of generalized epileptogenicity and profound diffuse encephalopathy, likely secondary to diffuse anoxic-hypoxic brain injury. No definite seizures were seen throughout the recording.  Dara Beidleman 

## 2020-07-29 ENCOUNTER — Inpatient Hospital Stay (HOSPITAL_COMMUNITY): Payer: Medicaid Other

## 2020-07-29 DIAGNOSIS — I469 Cardiac arrest, cause unspecified: Secondary | ICD-10-CM | POA: Diagnosis not present

## 2020-07-29 LAB — CBC WITH DIFFERENTIAL/PLATELET
Abs Immature Granulocytes: 0.09 10*3/uL — ABNORMAL HIGH (ref 0.00–0.07)
Basophils Absolute: 0 10*3/uL (ref 0.0–0.1)
Basophils Relative: 0 %
Eosinophils Absolute: 0 10*3/uL (ref 0.0–0.5)
Eosinophils Relative: 0 %
HCT: 31.7 % — ABNORMAL LOW (ref 36.0–46.0)
Hemoglobin: 9.1 g/dL — ABNORMAL LOW (ref 12.0–15.0)
Immature Granulocytes: 0 %
Lymphocytes Relative: 8 %
Lymphs Abs: 1.8 10*3/uL (ref 0.7–4.0)
MCH: 19.6 pg — ABNORMAL LOW (ref 26.0–34.0)
MCHC: 28.7 g/dL — ABNORMAL LOW (ref 30.0–36.0)
MCV: 68.3 fL — ABNORMAL LOW (ref 80.0–100.0)
Monocytes Absolute: 1 10*3/uL (ref 0.1–1.0)
Monocytes Relative: 4 %
Neutro Abs: 19.3 10*3/uL — ABNORMAL HIGH (ref 1.7–7.7)
Neutrophils Relative %: 88 %
Platelets: 137 10*3/uL — ABNORMAL LOW (ref 150–400)
RBC: 4.64 MIL/uL (ref 3.87–5.11)
RDW: 23.5 % — ABNORMAL HIGH (ref 11.5–15.5)
WBC: 22.2 10*3/uL — ABNORMAL HIGH (ref 4.0–10.5)
nRBC: 0.1 % (ref 0.0–0.2)

## 2020-07-29 LAB — GLUCOSE, CAPILLARY
Glucose-Capillary: 100 mg/dL — ABNORMAL HIGH (ref 70–99)
Glucose-Capillary: 103 mg/dL — ABNORMAL HIGH (ref 70–99)
Glucose-Capillary: 108 mg/dL — ABNORMAL HIGH (ref 70–99)
Glucose-Capillary: 193 mg/dL — ABNORMAL HIGH (ref 70–99)
Glucose-Capillary: 54 mg/dL — ABNORMAL LOW (ref 70–99)
Glucose-Capillary: 92 mg/dL (ref 70–99)
Glucose-Capillary: 94 mg/dL (ref 70–99)

## 2020-07-29 LAB — BASIC METABOLIC PANEL
Anion gap: 13 (ref 5–15)
Anion gap: 12 (ref 5–15)
BUN: 22 mg/dL — ABNORMAL HIGH (ref 6–20)
BUN: 31 mg/dL — ABNORMAL HIGH (ref 6–20)
CO2: 21 mmol/L — ABNORMAL LOW (ref 22–32)
CO2: 23 mmol/L (ref 22–32)
Calcium: 7.2 mg/dL — ABNORMAL LOW (ref 8.9–10.3)
Calcium: 7.4 mg/dL — ABNORMAL LOW (ref 8.9–10.3)
Chloride: 106 mmol/L (ref 98–111)
Chloride: 102 mmol/L (ref 98–111)
Creatinine, Ser: 3.78 mg/dL — ABNORMAL HIGH (ref 0.44–1.00)
Creatinine, Ser: 4.45 mg/dL — ABNORMAL HIGH (ref 0.44–1.00)
GFR, Estimated: 15 mL/min — ABNORMAL LOW (ref >60)
GFR, Estimated: 13 mL/min — ABNORMAL LOW (ref >60)
Glucose, Bld: 89 mg/dL (ref 70–99)
Glucose, Bld: 122 mg/dL — ABNORMAL HIGH (ref 70–99)
Potassium: 6.3 mmol/L (ref 3.5–5.1)
Potassium: 4.1 mmol/L (ref 3.5–5.1)
Sodium: 140 mmol/L (ref 135–145)
Sodium: 137 mmol/L (ref 135–145)

## 2020-07-29 LAB — POCT I-STAT 7, (LYTES, BLD GAS, ICA,H+H)
Acid-base deficit: 1 mmol/L (ref 0.0–2.0)
Bicarbonate: 24.1 mmol/L (ref 20.0–28.0)
Calcium, Ion: 1.05 mmol/L — ABNORMAL LOW (ref 1.15–1.40)
HCT: 30 % — ABNORMAL LOW (ref 36.0–46.0)
Hemoglobin: 10.2 g/dL — ABNORMAL LOW (ref 12.0–15.0)
O2 Saturation: 98 %
Patient temperature: 36.6
Potassium: 5.1 mmol/L (ref 3.5–5.1)
Sodium: 139 mmol/L (ref 135–145)
TCO2: 25 mmol/L (ref 22–32)
pCO2 arterial: 38.6 mmHg (ref 32.0–48.0)
pH, Arterial: 7.401 (ref 7.350–7.450)
pO2, Arterial: 104 mmHg (ref 83.0–108.0)

## 2020-07-29 LAB — COOXEMETRY PANEL
Carboxyhemoglobin: 1.3 % (ref 0.5–1.5)
Methemoglobin: 1.4 % (ref 0.0–1.5)
O2 Saturation: 80.1 %
Total hemoglobin: 9.4 g/dL — ABNORMAL LOW (ref 12.0–16.0)

## 2020-07-29 LAB — MAGNESIUM
Magnesium: 1.9 mg/dL (ref 1.7–2.4)
Magnesium: 1.6 mg/dL — ABNORMAL LOW (ref 1.7–2.4)

## 2020-07-29 LAB — PHOSPHORUS
Phosphorus: 2.7 mg/dL (ref 2.5–4.6)
Phosphorus: 3.5 mg/dL (ref 2.5–4.6)

## 2020-07-29 LAB — TRIGLYCERIDES: Triglycerides: 122 mg/dL (ref <150)

## 2020-07-29 LAB — LACTIC ACID, PLASMA
Lactic Acid, Venous: 4.4 mmol/L (ref 0.5–1.9)
Lactic Acid, Venous: 4.4 mmol/L (ref 0.5–1.9)

## 2020-07-29 MED ORDER — DEXTROSE 50 % IV SOLN
INTRAVENOUS | Status: AC
  Filled 2020-07-29: qty 50

## 2020-07-29 MED ORDER — MAGNESIUM SULFATE 4 GM/100ML IV SOLN
4.0000 g | Freq: Once | INTRAVENOUS | Status: AC
  Administered 2020-07-29: 4 g via INTRAVENOUS
  Filled 2020-07-29: qty 100

## 2020-07-29 MED ORDER — HYDRALAZINE HCL 10 MG PO TABS
10.0000 mg | ORAL_TABLET | Freq: Three times a day (TID) | ORAL | Status: DC
  Administered 2020-07-29 – 2020-07-30 (×3): 10 mg
  Filled 2020-07-29 (×3): qty 1

## 2020-07-29 MED ORDER — SODIUM ZIRCONIUM CYCLOSILICATE 10 G PO PACK
10.0000 g | PACK | Freq: Two times a day (BID) | ORAL | Status: AC
  Administered 2020-07-29 (×2): 10 g
  Filled 2020-07-29 (×2): qty 1

## 2020-07-29 MED ORDER — DEXTROSE 50 % IV SOLN
25.0000 g | INTRAVENOUS | Status: AC
  Administered 2020-07-29: 25 g via INTRAVENOUS

## 2020-07-29 MED ORDER — VITAL HIGH PROTEIN PO LIQD
1000.0000 mL | ORAL | Status: DC
  Administered 2020-07-29 – 2020-07-30 (×2): 1000 mL

## 2020-07-29 MED ORDER — NITROGLYCERIN 0.2 MG/HR TD PT24
0.2000 mg | MEDICATED_PATCH | Freq: Every day | TRANSDERMAL | Status: DC
  Administered 2020-07-29 – 2020-07-31 (×3): 0.2 mg via TRANSDERMAL
  Filled 2020-07-29 (×4): qty 1

## 2020-07-29 MED ORDER — PROSOURCE TF PO LIQD
45.0000 mL | Freq: Two times a day (BID) | ORAL | Status: DC
  Administered 2020-07-29 – 2020-07-30 (×3): 45 mL
  Filled 2020-07-29 (×3): qty 45

## 2020-07-29 MED ORDER — ACETAMINOPHEN 160 MG/5ML PO SOLN
650.0000 mg | ORAL | Status: DC | PRN
  Administered 2020-07-29: 650 mg
  Filled 2020-07-29: qty 20.3

## 2020-07-29 MED ORDER — ACETAMINOPHEN 160 MG/5ML PO SOLN: 650.0000 mg | ORAL | Status: DC | PRN

## 2020-07-29 MED ORDER — LEVETIRACETAM IN NACL 500 MG/100ML IV SOLN
500.0000 mg | Freq: Two times a day (BID) | INTRAVENOUS | Status: DC
  Administered 2020-07-29 – 2020-08-01 (×6): 500 mg via INTRAVENOUS
  Filled 2020-07-29 (×6): qty 100

## 2020-07-29 NOTE — Progress Notes (Signed)
eLink Physician-Brief Progress Note Patient Name: Lisa Vazquez DOB: 1986-01-08 MRN: [id number]   Date of Service  07/29/2020  HPI/Events of Note  K+ 6.3, Lactic acid 4.4.  eICU Interventions  Lokelma 10 gm via NG tube bid x 2 doses. Elevated lactate likely cardiogenic and will only improve if patient's hemodynamics improve (EF 10-15 %).        Thomasene Lot Jovian Lembcke 07/29/2020, 5:11 AM

## 2020-07-29 NOTE — Progress Notes (Signed)
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  Total downtime prior to ROSC ~3mins.    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  Per fiancee she was normal when he returned home from work, then ~15 mins later when he came back into the room from changing clothes she was unresponsive.  Reportedly no c/o prior, in her usual state of health, however fiancee states she was poorly compliant with medication regimen and f/u.   Past Medical History   has no past medical history on file.   Significant Hospital Events   Admission to ICU following cardiac arrest.   Consults:  Cardiology   Procedures:  ETT 11/5>>> L IJ CVL 11/5>>>  Significant Diagnostic Tests:  11/6 CT head - no acute abnormalities 11/6  Echo - EF 10%., Grade II DD, severe mitral regurgitation. Moderate RV dysfunction.  11/7 cEEG - consistent with myoclonic status now with burst suppression pattern. 11/7 CXR - significant interval improvement with near complete resolution of pulmonary edema.  Micro Data:  COVID 11/5>>> negative   Antimicrobials:  None  Interim history/subjective:   Weaned of milrinone and norepinephrine. Continues to have difficult to control myoclonus.   Objective   Blood pressure 115/83, pulse (!) 110, temperature 100 F (37.8 C), resp. rate (!) 30, height 5\' 4"  (1.626 m), weight 85.7 kg, SpO2 100 %. CVP:  [8 mmHg-15 mmHg] 10 mmHg  Vent Mode: PRVC FiO2 (%):  [40 %-80 %] 40 % Set Rate:  [30 bmp] 30 bmp Vt Set:  [450 mL] 450 mL PEEP:  [8 cmH20-10 cmH20] 8  cmH20 Plateau Pressure:  [21 cmH20-26 cmH20] 21 cmH20   Intake/Output Summary (Last 24 hours) at 07/29/2020 1012 Last data filed at 07/29/2020 0900 Gross per 24 hour  Intake 2274.67 ml  Output 4410 ml  Net -2135.33 ml   Filed Weights   07/28/20 0100  Weight: 85.7 kg    Examination: General: critically ill appearing female, slender build.13/06/21  HENT: mm moist, ETT, OGT draining minimal gastric content Lungs: no ventilator dyssynchrony. Chest now clear bilaterally.  Cardiovascular: LV heave, summation gallop. 3/6 holosystolic murmur head into axilla.  Abdomen: soft, +bs  Extremities: cool, 1+ BLE pitting edema  Neuro: no seizure activity, no response to stimulation.    Resolved Hospital Problem list     Assessment & Plan:   VFib arrest - in setting known severe MR, cardiomyopathy with previous EF 25% (2020) with poor compliance - on amiodarone  Cardiogenic and distributive shock requiring titration of milrinone and norepinephrine due to acute decompensated HFrEF Acute hypoxic and hypercarbic respiratory failure due to acute pulmonary edema Hypoxic ischemic encephalopathy with myoclonus.  Gastric ileus. AKI with hyperkalemia   Plan:  - Continue amiodarone IV, consider oral switch with load tomorrow and introduction of beta-blocker. - Now off vasoactive infusions.  - Start to introduce GDHFTx - start with low dose HDLZ and topical nitrates.  - Full ventilatory support - increase respiratory rate to clear CO2 post arrest. - Continue furosemide infusion to manage pulmonary edema -  follow renal function and filling pressures - cEEG and titrate midazolam to seizure suppression. On valproate, Keppra, Vimpat.  -TTM for fever control - Would be poor dialysis candidate given overall condition.   Prognosis is poor given likelihood of anoxic encephalopathy, decompensated HF with cardiogenic shock which represents the natural history of untreated HF. Non-adherence precludes mechanical  support or other advanced therapies.    Best practice:  Diet: NPO - Start tube feeds Pain/Anxiety/Delirium protocol (if indicated): titrate to seizure control. VAP protocol (if indicated): ordered  DVT prophylaxis: SCD GI prophylaxis: ppi  Glucose control: na  Mobility: BR Code Status: full Family Communication:  Palliative care consultation.  I spoke to father at length and he understands the gravity of her condition and that prognosis will ultimately depend on neurological recovery Disposition: ICU  Labs   CBC: Recent Labs  Lab 08/11/2020 2208 08/07/2020 2309 07/28/20 0115 07/28/20 0158 07/28/20 0422 07/28/20 1408 07/29/20 0351  WBC 9.3  --  14.1*  --   --   --  22.2*  NEUTROABS 2.0  --   --   --   --   --  19.3*  HGB 10.0*   < > 9.9* 11.6* 11.2* 11.6* 9.1*  HCT 36.3   < > 35.3* 34.0* 33.0* 34.0* 31.7*  MCV 72.2*  --  69.5*  --   --   --  68.3*  PLT 109*  --  182  --   --   --  137*   < > = values in this interval not displayed.    Basic Metabolic Panel: Recent Labs  Lab 08/19/2020 2208 07/28/2020 2309 07/28/20 0115 07/28/20 0115 07/28/20 0158 07/28/20 0422 07/28/20 1129 07/28/20 1408 07/29/20 0351  NA 138   < > 141   < > 143 142 141 141 140  K 3.1*   < > 2.9*   < > 2.9* 3.4* 3.9 4.1 6.3*  CL 104  --  104  --   --   --  106  --  106  CO2 19*  --  27  --   --   --  22  --  21*  GLUCOSE 345*  --  179*  --   --   --  130*  --  89  BUN <5*  --  5*  --   --   --  10  --  22*  CREATININE 1.36*  --  1.59*  --   --   --  2.42*  --  3.78*  CALCIUM 7.7*  --  7.3*  --   --   --  7.3*  --  7.2*  MG  --   --  2.1  --   --   --   --   --   --   PHOS  --   --  6.7*  --   --   --   --   --   --    < > = values in this interval not displayed.   GFR: Estimated Creatinine Clearance: 22 mL/min (A) (by C-G formula based on SCr of 3.78 mg/dL (H)). Recent Labs  Lab 07/26/2020 2208 07/28/20 0115 07/29/20 0350 07/29/20 0351 07/29/20 0514  WBC 9.3 14.1*  --  22.2*  --    LATICACIDVEN 6.3* 3.8* 4.4*  --  4.4*    Liver Function Tests: Recent Labs  Lab 07/25/2020 2208  AST 118*  ALT 61*  ALKPHOS 66  BILITOT 0.4  PROT 5.1*  ALBUMIN 2.5*   No results for input(s): LIPASE, AMYLASE in the last 168 hours. No results for input(s): AMMONIA in the last 168 hours.  ABG    Component Value Date/Time   PHART 7.360 07/28/2020 1408   PCO2ART 44.6 07/28/2020 1408   PO2ART 81 (L) 07/28/2020 1408   HCO3 25.3 07/28/2020 1408   TCO2 27 07/28/2020 1408   ACIDBASEDEF 1.0 07/28/2020 1408   O2SAT 80.1 07/29/2020 0350     Coagulation Profile: Recent Labs  Lab August 26, 2020 2208 07/28/20 0115  INR 1.5* 1.6*    Cardiac Enzymes: No results for input(s): CKTOTAL, CKMB, CKMBINDEX, TROPONINI in the last 168 hours.  HbA1C: No results found for: HGBA1C  CBG: Recent Labs  Lab 07/28/20 2031 07/28/20 2358 07/29/20 0351 07/29/20 0419 07/29/20 0749  GLUCAP 74 70 54* 193* 100*   CRITICAL CARE Performed by: Lynnell Catalan   Total critical care time: 45 minutes  Critical care time was exclusive of separately billable procedures and treating other patients.  Critical care was necessary to treat or prevent imminent or life-threatening deterioration.  Critical care was time spent personally by me on the following activities: development of treatment plan with patient and/or surrogate as well as nursing, discussions with consultants, evaluation of patient's response to treatment, examination of patient, obtaining history from patient or surrogate, ordering and performing treatments and interventions, ordering and review of laboratory studies, ordering and review of radiographic studies, pulse oximetry, re-evaluation of patient's condition and participation in multidisciplinary rounds.  Lynnell Catalan, MD [facility] ICU Physician [facility] [place] Critical [facility]  Pager: [phone] Mobile: [phone] After hours: [phone].

## 2020-07-29 NOTE — Progress Notes (Signed)
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  Total downtime prior to ROSC ~69mins.    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  Per fiancee she was normal when he returned home from work, then ~15 mins later when he came back into the room from changing clothes she was unresponsive.  Reportedly no c/o prior, in her usual state of health, however fiancee states she was poorly compliant with medication regimen and f/u.   Past Medical History   has no past medical history on file.   Significant Hospital Events   Admission to ICU following cardiac arrest.   Consults:  Cardiology   Procedures:  ETT 11/5>>> L IJ CVL 11/5>>>  Significant Diagnostic Tests:  11/6 CT head - no acute abnormalities 11/6  Echo - EF 10%., Grade II DD, severe mitral regurgitation. Moderate RV dysfunction.  11/7 cEEG - consistent with myoclonic status now with burst suppression pattern. 11/7 CXR - significant interval improvement with near complete resolution of pulmonary edema.  Micro Data:  COVID 11/5>>> negative   Antimicrobials:  None  Interim history/subjective:   Weaned of milrinone and norepinephrine. Continues to have difficult to control myoclonus.   Objective   Blood pressure 115/83, pulse (!) 110, temperature 100 F (37.8 C), resp. rate (!) 30, height 5\' 4"  (1.626 m), weight 85.7 kg, SpO2 100 %. CVP:  [8 mmHg-15 mmHg] 10 mmHg  Vent Mode: PRVC FiO2 (%):  [40 %-80 %] 40 % Set Rate:  [30 bmp] 30 bmp Vt Set:  [450 mL] 450 mL PEEP:  [8 cmH20-10 cmH20] 8  cmH20 Plateau Pressure:  [21 cmH20-26 cmH20] 21 cmH20   Intake/Output Summary (Last 24 hours) at 07/29/2020 1106 Last data filed at 07/29/2020 0900 Gross per 24 hour  Intake 2181.31 ml  Output 4285 ml  Net -2103.69 ml   Filed Weights   07/28/20 0100  Weight: 85.7 kg    Examination: General: critically ill appearing female, slender build.13/06/21  HENT: mm moist, ETT, OGT draining minimal gastric content Lungs: no ventilator dyssynchrony. Chest now clear bilaterally.  Cardiovascular: LV heave, summation gallop. 3/6 holosystolic murmur head into axilla.  Abdomen: soft, +bs  Extremities: cool, 1+ BLE pitting edema  Neuro: no seizure activity, no response to stimulation.    Resolved Hospital Problem list     Assessment & Plan:   VFib arrest - in setting known severe MR, cardiomyopathy with previous EF 25% (2020) with poor compliance - on amiodarone  Cardiogenic and distributive shock requiring titration of milrinone and norepinephrine due to acute decompensated HFrEF Acute hypoxic and hypercarbic respiratory failure due to acute pulmonary edema Hypoxic ischemic encephalopathy with myoclonus.  Gastric ileus. AKI with hyperkalemia   Plan:  - Continue amiodarone IV, consider oral switch with load tomorrow and introduction of beta-blocker. - Now off vasoactive infusions.  - Start to introduce GDHFTx - start with low dose HDLZ and topical nitrates.  - Full ventilatory support - increase respiratory rate to clear CO2 post arrest. - Continue furosemide infusion to manage pulmonary edema -  follow renal function and filling pressures - cEEG and titrate midazolam to seizure suppression. On valproate, Keppra, Vimpat.  -TTM for fever control - Would be poor dialysis candidate given overall condition.   Prognosis is poor given likelihood of anoxic encephalopathy, decompensated HF with cardiogenic shock which represents the natural history of untreated HF. Non-adherence precludes mechanical  support or other advanced therapies.    Best practice:  Diet: NPO - Start tube feeds Pain/Anxiety/Delirium protocol (if indicated): titrate to seizure control. VAP protocol (if indicated): ordered  DVT prophylaxis: SCD GI prophylaxis: ppi  Glucose control: na  Mobility: BR Code Status: DNR if re-arrest.  Family Communication:  Palliative care consultation.  I spoke to father at length and he understands the gravity of her condition and that prognosis will ultimately depend on neurological recovery Disposition: ICU  Labs   CBC: Recent Labs  Lab 08/18/2020 2208 2020-08-18 2309 07/28/20 0115 07/28/20 0115 07/28/20 0158 07/28/20 0422 07/28/20 1408 07/29/20 0351 07/29/20 1039  WBC 9.3  --  14.1*  --   --   --   --  22.2*  --   NEUTROABS 2.0  --   --   --   --   --   --  19.3*  --   HGB 10.0*   < > 9.9*   < > 11.6* 11.2* 11.6* 9.1* 10.2*  HCT 36.3   < > 35.3*   < > 34.0* 33.0* 34.0* 31.7* 30.0*  MCV 72.2*  --  69.5*  --   --   --   --  68.3*  --   PLT 109*  --  182  --   --   --   --  137*  --    < > = values in this interval not displayed.    Basic Metabolic Panel: Recent Labs  Lab 08-18-20 2208 18-Aug-2020 2309 07/28/20 0115 07/28/20 0158 07/28/20 0422 07/28/20 1129 07/28/20 1408 07/29/20 0351 07/29/20 1039  NA 138   < > 141   < > 142 141 141 140 139  K 3.1*   < > 2.9*   < > 3.4* 3.9 4.1 6.3* 5.1  CL 104  --  104  --   --  106  --  106  --   CO2 19*  --  27  --   --  22  --  21*  --   GLUCOSE 345*  --  179*  --   --  130*  --  89  --   BUN <5*  --  5*  --   --  10  --  22*  --   CREATININE 1.36*  --  1.59*  --   --  2.42*  --  3.78*  --   CALCIUM 7.7*  --  7.3*  --   --  7.3*  --  7.2*  --   MG  --   --  2.1  --   --   --   --   --   --   PHOS  --   --  6.7*  --   --   --   --   --   --    < > = values in this interval not displayed.   GFR: Estimated Creatinine Clearance: 22 mL/min (A) (by C-G formula based on SCr of 3.78 mg/dL (H)). Recent Labs  Lab  2020/08/18 2208 07/28/20 0115 07/29/20 0350 07/29/20 0351 07/29/20 0514  WBC 9.3 14.1*  --  22.2*  --   LATICACIDVEN 6.3* 3.8* 4.4*  --  4.4*    Liver Function Tests: Recent Labs  Lab 07/26/2020 2208  AST 118*  ALT 61*  ALKPHOS 66  BILITOT 0.4  PROT 5.1*  ALBUMIN 2.5*   No results for input(s): LIPASE, AMYLASE in the last 168 hours. No results for input(s): AMMONIA in the last 168 hours.  ABG    Component Value Date/Time   PHART 7.401 07/29/2020 1039   PCO2ART 38.6 07/29/2020 1039   PO2ART 104 07/29/2020 1039   HCO3 24.1 07/29/2020 1039   TCO2 25 07/29/2020 1039   ACIDBASEDEF 1.0 07/29/2020 1039   O2SAT 98.0 07/29/2020 1039     Coagulation Profile: Recent Labs  Lab 07/23/2020 2208 07/28/20 0115  INR 1.5* 1.6*    Cardiac Enzymes: No results for input(s): CKTOTAL, CKMB, CKMBINDEX, TROPONINI in the last 168 hours.  HbA1C: No results found for: HGBA1C  CBG: Recent Labs  Lab 07/28/20 2031 07/28/20 2358 07/29/20 0351 07/29/20 0419 07/29/20 0749  GLUCAP 74 70 54* 193* 100*   CRITICAL CARE Performed by: Lynnell Catalan   Total critical care time: 45 minutes  Critical care time was exclusive of separately billable procedures and treating other patients.  Critical care was necessary to treat or prevent imminent or life-threatening deterioration.  Critical care was time spent personally by me on the following activities: development of treatment plan with patient and/or surrogate as well as nursing, discussions with consultants, evaluation of patient's response to treatment, examination of patient, obtaining history from patient or surrogate, ordering and performing treatments and interventions, ordering and review of laboratory studies, ordering and review of radiographic studies, pulse oximetry, re-evaluation of patient's condition and participation in multidisciplinary rounds.  Lynnell Catalan, MD [facility] ICU Physician [facility] [place] Critical Care  Pager:  [phone] Mobile: [phone] After hours: [phone].

## 2020-07-29 NOTE — Progress Notes (Signed)
Assisted tele visit to patient with family member.  Kennya Schwenn Ann, RN  

## 2020-07-29 NOTE — Progress Notes (Signed)
Hypoglycemic Event  CBG: 54 Treatment: 25 g D50 IV  Symptoms: Unable to assess  Follow-up CBG: Time: 0412 CBG Result:193   Possible Reasons for Event: pt is febrile, shivering/possible seizure activity  Comments/MD notified:ELINK    Lisa Vazquez

## 2020-07-29 NOTE — Progress Notes (Signed)
ELINK notified regarding pt's continued fever and no PRN orders available at this time and pt's hypoglycemia.  MD ordered PRN medication and hypoglycemia treated per protocol, see note.

## 2020-07-29 NOTE — Progress Notes (Signed)
eLink Physician-Brief Progress Note Patient Name: Lisa Vazquez DOB: 02/09/1986 MRN: [id number]   Date of Service  07/29/2020  HPI/Events of Note  Patient needs a PRN order for an anti-pyretic.  eICU Interventions  PRN Tylenol ordered.        Thomasene Lot Dhruvan Gullion 07/29/2020, 3:50 AM

## 2020-07-29 NOTE — Plan of Care (Signed)
  Problem: Clinical Measurements: Goal: Ability to maintain clinical measurements within normal limits will improve Outcome: Not Progressing Goal: Will remain free from infection Outcome: Progressing   Problem: Nutrition: Goal: Adequate nutrition will be maintained Outcome: Progressing   Problem: Elimination: Goal: Will not experience complications related to urinary retention Outcome: Progressing   Problem: Skin Integrity: Goal: Risk for impaired skin integrity will decrease Outcome: Progressing   Problem: Education: Goal: Ability to manage disease process will improve Outcome: Not Progressing

## 2020-07-29 NOTE — Progress Notes (Signed)
LTM maint complete - no skin breakdown under: Fp2 F4 A2   

## 2020-07-29 NOTE — Procedures (Addendum)
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 Total downtime prior to ROSC ~56mins. EEG to evaluate for seizure.  Level of alertness:  comatose  AEDs during EEG study: LEV, Propofol  Technical aspects: This EEG study was done with scalp electrodes positioned according to the 10-20 International system of electrode placement. Electrical activity was acquired at a sampling rate of 500Hz  and reviewed with a high frequency filter of 70Hz  and a low frequency filter of 1Hz . EEG data were recorded continuously and digitally stored.   Description: EEG initially showed burst suppression with bursts of highly epileptiform discharges lasting 0.5-4 seconds and eeg suppression lasting 4-11 seconds. Gradually after titration of versed, eeg showed generalized, maximal bifrontal periodic epileptiform discharges at 1-1.5hz . There were periods of background attentuation between GPEDs which gradually improved to continuous low amplitude 3-6hz  theta-delta slowing.    Event button was pushed on 11/6/021 at 1129,1156, 1313, 1332, 1414 for unclear reason. Concomitant eeg showed generalized bursts of epileptiform discharges.  Event button was pressed on 07/28/2020 at 1556 for repeated brief eye opening. Concomitant eeg showed generalized bursts of epileptiform discharges.  ABNORMALITY - Myoclonic seizure, generalized  - Burst suppression with highly epileptiform discharges, generalized - Periodic epileptiform discharges, Generalized   IMPRESSION: This study showed frequent myoclonic seizures as well as generalized epileptogenicity. 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   Lisa Vazquez 

## 2020-07-30 DIAGNOSIS — I469 Cardiac arrest, cause unspecified: Secondary | ICD-10-CM | POA: Diagnosis not present

## 2020-07-30 LAB — BASIC METABOLIC PANEL
Anion gap: 13 (ref 5–15)
BUN: 39 mg/dL — ABNORMAL HIGH (ref 6–20)
CO2: 24 mmol/L (ref 22–32)
Calcium: 7.8 mg/dL — ABNORMAL LOW (ref 8.9–10.3)
Chloride: 101 mmol/L (ref 98–111)
Creatinine, Ser: 4.57 mg/dL — ABNORMAL HIGH (ref 0.44–1.00)
GFR, Estimated: 12 mL/min — ABNORMAL LOW (ref >60)
Glucose, Bld: 112 mg/dL — ABNORMAL HIGH (ref 70–99)
Potassium: 3.5 mmol/L (ref 3.5–5.1)
Sodium: 138 mmol/L (ref 135–145)

## 2020-07-30 LAB — CBC WITH DIFFERENTIAL/PLATELET
Abs Immature Granulocytes: 0.15 10*3/uL — ABNORMAL HIGH (ref 0.00–0.07)
Basophils Absolute: 0 10*3/uL (ref 0.0–0.1)
Basophils Relative: 0 %
Eosinophils Absolute: 0 10*3/uL (ref 0.0–0.5)
Eosinophils Relative: 0 %
HCT: 28.2 % — ABNORMAL LOW (ref 36.0–46.0)
Hemoglobin: 8.9 g/dL — ABNORMAL LOW (ref 12.0–15.0)
Immature Granulocytes: 1 %
Lymphocytes Relative: 10 %
Lymphs Abs: 1.5 10*3/uL (ref 0.7–4.0)
MCH: 19.7 pg — ABNORMAL LOW (ref 26.0–34.0)
MCHC: 31.6 g/dL (ref 30.0–36.0)
MCV: 62.4 fL — ABNORMAL LOW (ref 80.0–100.0)
Monocytes Absolute: 0.8 10*3/uL (ref 0.1–1.0)
Monocytes Relative: 5 %
Neutro Abs: 13.4 10*3/uL — ABNORMAL HIGH (ref 1.7–7.7)
Neutrophils Relative %: 84 %
Platelets: 115 10*3/uL — ABNORMAL LOW (ref 150–400)
RBC: 4.52 MIL/uL (ref 3.87–5.11)
RDW: 21.9 % — ABNORMAL HIGH (ref 11.5–15.5)
WBC: 16 10*3/uL — ABNORMAL HIGH (ref 4.0–10.5)
nRBC: 0.1 % (ref 0.0–0.2)

## 2020-07-30 LAB — GLUCOSE, CAPILLARY
Glucose-Capillary: 105 mg/dL — ABNORMAL HIGH (ref 70–99)
Glucose-Capillary: 109 mg/dL — ABNORMAL HIGH (ref 70–99)
Glucose-Capillary: 109 mg/dL — ABNORMAL HIGH (ref 70–99)
Glucose-Capillary: 111 mg/dL — ABNORMAL HIGH (ref 70–99)
Glucose-Capillary: 114 mg/dL — ABNORMAL HIGH (ref 70–99)
Glucose-Capillary: 98 mg/dL (ref 70–99)

## 2020-07-30 LAB — MAGNESIUM
Magnesium: 3.1 mg/dL — ABNORMAL HIGH (ref 1.7–2.4)
Magnesium: 2.8 mg/dL — ABNORMAL HIGH (ref 1.7–2.4)

## 2020-07-30 LAB — POCT I-STAT 7, (LYTES, BLD GAS, ICA,H+H)
Acid-Base Excess: 1 mmol/L (ref 0.0–2.0)
Bicarbonate: 25.8 mmol/L (ref 20.0–28.0)
Calcium, Ion: 1.08 mmol/L — ABNORMAL LOW (ref 1.15–1.40)
HCT: 30 % — ABNORMAL LOW (ref 36.0–46.0)
Hemoglobin: 10.2 g/dL — ABNORMAL LOW (ref 12.0–15.0)
O2 Saturation: 98 %
Potassium: 3.6 mmol/L (ref 3.5–5.1)
Sodium: 139 mmol/L (ref 135–145)
TCO2: 27 mmol/L (ref 22–32)
pCO2 arterial: 38.6 mmHg (ref 32.0–48.0)
pH, Arterial: 7.433 (ref 7.350–7.450)
pO2, Arterial: 101 mmHg (ref 83.0–108.0)

## 2020-07-30 LAB — PHOSPHORUS
Phosphorus: 4.1 mg/dL (ref 2.5–4.6)
Phosphorus: 4.5 mg/dL (ref 2.5–4.6)

## 2020-07-30 LAB — COOXEMETRY PANEL
Carboxyhemoglobin: 1.5 % (ref 0.5–1.5)
Methemoglobin: 1.2 % (ref 0.0–1.5)
O2 Saturation: 64.8 %
Total hemoglobin: 9.2 g/dL — ABNORMAL LOW (ref 12.0–16.0)

## 2020-07-30 LAB — TRIGLYCERIDES: Triglycerides: 107 mg/dL

## 2020-07-30 MED ORDER — AMIODARONE HCL 200 MG PO TABS: 400.0000 mg | ORAL_TABLET | Freq: Every day | ORAL | Status: DC

## 2020-07-30 MED ORDER — AMIODARONE HCL 200 MG PO TABS
400.0000 mg | ORAL_TABLET | Freq: Two times a day (BID) | ORAL | Status: DC
  Administered 2020-07-30 – 2020-08-01 (×5): 400 mg
  Filled 2020-07-30 (×4): qty 2

## 2020-07-30 MED ORDER — AMIODARONE HCL 200 MG PO TABS
400.0000 mg | ORAL_TABLET | Freq: Two times a day (BID) | ORAL | Status: DC
  Filled 2020-07-30: qty 2

## 2020-07-30 MED ORDER — CHLORHEXIDINE GLUCONATE CLOTH 2 % EX PADS
6.0000 | MEDICATED_PAD | Freq: Every day | CUTANEOUS | Status: DC
  Administered 2020-07-30 – 2020-08-01 (×2): 6 via TOPICAL

## 2020-07-30 MED ORDER — AMIODARONE HCL IN DEXTROSE 360-4.14 MG/200ML-% IV SOLN
30.0000 mg/h | INTRAVENOUS | Status: DC
  Administered 2020-07-30 – 2020-07-31 (×3): 30 mg/h via INTRAVENOUS
  Filled 2020-07-30 (×2): qty 200

## 2020-07-30 MED ORDER — METOPROLOL TARTRATE 12.5 MG HALF TABLET: 12.5000 mg | ORAL_TABLET | Freq: Two times a day (BID) | ORAL | Status: DC

## 2020-07-30 MED ORDER — METOPROLOL TARTRATE 12.5 MG HALF TABLET
12.5000 mg | ORAL_TABLET | Freq: Two times a day (BID) | ORAL | Status: DC
  Filled 2020-07-30: qty 1

## 2020-07-30 MED ORDER — HYDRALAZINE HCL 25 MG PO TABS
25.0000 mg | ORAL_TABLET | Freq: Three times a day (TID) | ORAL | Status: DC
  Administered 2020-07-30 – 2020-07-31 (×5): 25 mg
  Filled 2020-07-30 (×6): qty 1

## 2020-07-30 MED ORDER — AMIODARONE HCL 200 MG PO TABS: 400.0000 mg | ORAL_TABLET | Freq: Two times a day (BID) | ORAL | Status: DC

## 2020-07-30 MED ORDER — FUROSEMIDE 10 MG/ML IJ SOLN
80.0000 mg | Freq: Two times a day (BID) | INTRAMUSCULAR | Status: DC
  Administered 2020-07-30 – 2020-07-31 (×4): 80 mg via INTRAVENOUS
  Filled 2020-07-30 (×5): qty 8

## 2020-07-30 MED ORDER — VITAL AF 1.2 CAL PO LIQD
1000.0000 mL | ORAL | Status: DC
  Administered 2020-07-30 – 2020-08-01 (×3): 1000 mL
  Filled 2020-07-30: qty 1000

## 2020-07-30 MED ORDER — POTASSIUM CHLORIDE 20 MEQ PO PACK
40.0000 meq | PACK | Freq: Once | ORAL | Status: AC
  Administered 2020-07-30: 40 meq
  Filled 2020-07-30: qty 2

## 2020-07-30 MED ORDER — PANTOPRAZOLE SODIUM 40 MG PO PACK
40.0000 mg | PACK | Freq: Every day | ORAL | Status: DC
  Administered 2020-07-30 – 2020-08-01 (×3): 40 mg
  Filled 2020-07-30 (×3): qty 20

## 2020-07-30 MED ORDER — HEPARIN SODIUM (PORCINE) 5000 UNIT/ML IJ SOLN
5000.0000 [IU] | Freq: Three times a day (TID) | INTRAMUSCULAR | Status: DC
  Administered 2020-07-30 – 2020-08-01 (×6): 5000 [IU] via SUBCUTANEOUS
  Filled 2020-07-30 (×6): qty 1

## 2020-07-30 MED ORDER — METOPROLOL TARTRATE 12.5 MG HALF TABLET
12.5000 mg | ORAL_TABLET | Freq: Two times a day (BID) | ORAL | Status: DC
  Administered 2020-07-30 – 2020-07-31 (×4): 12.5 mg
  Filled 2020-07-30 (×4): qty 1

## 2020-07-30 MED FILL — Medication: Qty: 1 | Status: AC

## 2020-07-30 NOTE — Progress Notes (Signed)
This chaplain responded to the PMT consult for spiritual care.  The chaplain read the Pt. chart and consulted with PMT before the visit.  The chaplain introduced herself to the Pt. mother-Deborah.  The chaplain learned the Pt. has a strong spiritual presence in the family.  The chaplain shared how to get in touch with a chaplain if a spiritual care need arises.

## 2020-07-30 NOTE — Progress Notes (Signed)
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  Total downtime prior to ROSC ~6mins.    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  Per fiancee she was normal when he returned home from work, then ~15 mins later when he came back into the room from changing clothes she was unresponsive.  Reportedly no c/o prior, in her usual state of health, however fiancee states she was poorly compliant with medication regimen and f/u.   Past Medical History   has no past medical history on file.   Significant Hospital Events   Admission to ICU following cardiac arrest.   Consults:  Cardiology   Procedures:  ETT 11/5>>> L IJ CVL 11/5>>>  Significant Diagnostic Tests:  11/6 CT head - no acute abnormalities 11/6  Echo - EF 10%., Grade II DD, severe mitral regurgitation. Moderate RV dysfunction.  11/7 cEEG - consistent with myoclonic status now with burst suppression pattern. 11/7 CXR - significant interval improvement with near complete resolution of pulmonary edema.  Micro Data:  COVID 11/5>>> negative   Antimicrobials:  None  Interim history/subjective:   Tolerating introduction of hydralazine and nitrates.  Ventricular ectopy has settled with increased amiodarone infusion.   Objective   Blood pressure (!) 143/79, pulse 81, temperature 99 F (37.2 C), resp. rate (!) 30, height 5\' 4"  (1.626 m), weight 85.7 kg, SpO2 98 %. CVP:  [9 mmHg-16 mmHg] 9 mmHg  Vent Mode: PRVC FiO2 (%):  [40 %] 40 % Set Rate:  [30 bmp] 30 bmp Vt Set:  [450 mL] 450 mL PEEP:  [5  cmH20-8 cmH20] 5 cmH20 Plateau Pressure:  [17 cmH20-24 cmH20] 17 cmH20   Intake/Output Summary (Last 24 hours) at 07/30/2020 0843 Last data filed at 07/30/2020 0700 Gross per 24 hour  Intake 3376.8 ml  Output 5240 ml  Net -1863.2 ml   Filed Weights   07/28/20 0100  Weight: 85.7 kg    Examination: General: critically ill appearing female, slender build.13/06/21  HENT: mm moist, ETT, OGT infusing tube feeds.  Positive nasal discharge Lungs: no ventilator dyssynchrony. Chest now clear bilaterally.  FiO2 down to 40%/5 PEEP Cardiovascular: LV heave, summation gallop. 3/6 holosystolic murmur head into axilla.  Abdomen: soft, +bs.  Gastric residual 300 cc only Extremities: cool, 1+ BLE pitting edema has improved Neuro: no seizure activity, no response to stimulation.   Personally reviewed continuous EEG showing less epileptiform activity.  Increasingly normal looking background.  [employer]'s persist in varying frequency.   Resolved Hospital Problem list     Assessment & Plan:   VFib arrest - in setting known severe MR, cardiomyopathy with previous EF 25% (2020) with poor compliance - on amiodarone  Cardiogenic and distributive shock requiring titration of milrinone and norepinephrine due to acute decompensated HFrEF Acute hypoxic and hypercarbic respiratory failure due to acute pulmonary edema Hypoxic ischemic encephalopathy with myoclonus.  AKI with hyperkalemia   Plan:  -Introduce beta-blocker today, start oral amiodarone load.  Decrease IV amiodarone to 30 and stop tomorrow -Increase hydralazine to 25 today.  Continue  current Nitropatch. -Continue current ventilatory strategy.  Hold on weaning until able to come off sedation -Convert furosemide to intermittent dosing -Worsening renal function.  Holding on dialysis initiation as patient continues to respond to diuretic and neurological status still unclear. -Active seizures appear to be suppressed on valproate, levetiracetam, Vimpat.  Will  slowly start to wean midazolam given drug accumulation with renal failure. -Epilepsy service will advise on further management of myoclonic status.  Failure to wean from sedative infusions would be a poor prognostic sign as previously discussed with family. -TTM for fever control   Prognosis is poor given likelihood of anoxic encephalopathy, decompensated HF with cardiogenic shock which represents the natural history of untreated HF. Non-adherence precludes mechanical support or other advanced therapies.    Best practice:  Diet: NPO -continue tube feeds Pain/Anxiety/Delirium protocol (if indicated): Slow wean to seizure control. VAP protocol (if indicated): Bundle in place DVT prophylaxis: SCD GI prophylaxis: ppi  Glucose control: Euglycemic on no insulin therapy Mobility: BR Code Status: DNR if re-arrest.  Family Communication:  Palliative care consultation.  I spoke to father at length and he understands the gravity of her condition and that prognosis will ultimately depend on neurological recovery Disposition: ICU  Labs   CBC: Recent Labs  Lab 08/02/2020 2208 08/06/2020 2309 07/28/20 0115 07/28/20 0158 07/28/20 1408 07/29/20 0351 07/29/20 1039 07/30/20 0334 07/30/20 0407  WBC 9.3  --  14.1*  --   --  22.2*  --  16.0*  --   NEUTROABS 2.0  --   --   --   --  19.3*  --  13.4*  --   HGB 10.0*   < > 9.9*   < > 11.6* 9.1* 10.2* 8.9* 10.2*  HCT 36.3   < > 35.3*   < > 34.0* 31.7* 30.0* 28.2* 30.0*  MCV 72.2*  --  69.5*  --   --  68.3*  --  62.4*  --   PLT 109*  --  182  --   --  137*  --  115*  --    < > = values in this interval not displayed.    Basic Metabolic Panel: Recent Labs  Lab 07/28/20 0115 07/28/20 0158 07/28/20 1129 07/28/20 1408 07/29/20 0351 07/29/20 1037 07/29/20 1039 07/29/20 1706 07/30/20 0334 07/30/20 0407  NA 141   < > 141   < > 140  --  139 137 138 139  K 2.9*   < > 3.9   < > 6.3*  --  5.1 4.1 3.5 3.6  CL 104  --  106  --  106  --   --  102 101  --    CO2 27  --  22  --  21*  --   --  23 24  --   GLUCOSE 179*  --  130*  --  89  --   --  122* 112*  --   BUN 5*  --  10  --  22*  --   --  31* 39*  --   CREATININE 1.59*  --  2.42*  --  3.78*  --   --  4.45* 4.57*  --   CALCIUM 7.3*  --  7.3*  --  7.2*  --   --  7.4* 7.8*  --   MG 2.1  --   --   --   --  1.9  --  1.6* 3.1*  --   PHOS 6.7*  --   --   --   --  2.7  --  3.5 4.1  --    < > = values in this interval not displayed.   GFR: Estimated Creatinine Clearance: 18.2 mL/min (A) (by C-G formula based on SCr of 4.57 mg/dL (H)). Recent Labs  Lab 08/07/2020 2208 07/28/20 0115 07/29/20 0350 07/29/20 0351 07/29/20 0514 07/30/20 0334  WBC 9.3 14.1*  --  22.2*  --  16.0*  LATICACIDVEN 6.3* 3.8* 4.4*  --  4.4*  --     Liver Function Tests: Recent Labs  Lab 08/18/2020 2208  AST 118*  ALT 61*  ALKPHOS 66  BILITOT 0.4  PROT 5.1*  ALBUMIN 2.5*   No results for input(s): LIPASE, AMYLASE in the last 168 hours. No results for input(s): AMMONIA in the last 168 hours.  ABG    Component Value Date/Time   PHART 7.433 07/30/2020 0407   PCO2ART 38.6 07/30/2020 0407   PO2ART 101 07/30/2020 0407   HCO3 25.8 07/30/2020 0407   TCO2 27 07/30/2020 0407   ACIDBASEDEF 1.0 07/29/2020 1039   O2SAT 64.8 07/30/2020 0410     Coagulation Profile: Recent Labs  Lab 08/17/2020 2208 07/28/20 0115  INR 1.5* 1.6*    Cardiac Enzymes: No results for input(s): CKTOTAL, CKMB, CKMBINDEX, TROPONINI in the last 168 hours.  HbA1C: No results found for: HGBA1C  CBG: Recent Labs  Lab 07/29/20 1528 07/29/20 2058 07/29/20 2356 07/30/20 0404 07/30/20 0748  GLUCAP 94 108* 103* 109* 109*   CRITICAL CARE Performed by: Lynnell Catalan   Total critical care time: 40 minutes  Critical care time was exclusive of separately billable procedures and treating other patients.  Critical care was necessary to treat or prevent imminent or life-threatening deterioration.  Critical care was time spent  personally by me on the following activities: development of treatment plan with patient and/or surrogate as well as nursing, discussions with consultants, evaluation of patient's response to treatment, examination of patient, obtaining history from patient or surrogate, ordering and performing treatments and interventions, ordering and review of laboratory studies, ordering and review of radiographic studies, pulse oximetry, re-evaluation of patient's condition and participation in multidisciplinary rounds.  Lynnell Catalan, MD [facility] ICU Physician [facility] [place] [facility]  Pager: [phone] Mobile: [phone] After hours: [phone].

## 2020-07-30 NOTE — Progress Notes (Signed)
Initial Nutrition Assessment  DOCUMENTATION CODES:   Not applicable  INTERVENTION:   Tube Feeding via OG:  Vital AF 1.2 at 60 ml/hr Provides 1728 kcals, 108 g of protein and 1166 mL of free water Meets 100% estimated calorie and protein needs   NUTRITION DIAGNOSIS:   Inadequate oral intake related to acute illness as evidenced by NPO status.  GOAL:   Patient will meet greater than or equal to 90% of their needs  MONITOR:   TF tolerance, Vent status, Labs, Weight trends  REASON FOR ASSESSMENT:   Consult, Ventilator Enteral/tube feeding initiation and management  ASSESSMENT:   34 yo female admitted post V.fib arrest in setting of known severe MR, CM with previous EF 25%, AKI with hyperkalemia, intubated. PMH includes peripartum cardiomyopathy, poorly compliant with medication regimen.  TTM 37 degrees Patient is currently intubated on ventilator support MV: 12.8 L/min Temp (24hrs), Avg:98.7 F (37.1 C), Min:98.2 F (36.8 C), Max:99 F (37.2 C)  Noted poor prognosis; palliative care following. EEG showing myoclonic status, great concern for anoxic brain injury.   Tolerating Vital High Protein at 40 ml/hr via OG. Chest xray used for placement verification  Current wt 85.7 kg; no weight since 11/6.  Unable to obtain diet and weight history from patient at this time  Labs: reviewed Meds: lasix   Diet Order:   Diet Order            Diet NPO time specified  Diet effective now                 EDUCATION NEEDS:   Not appropriate for education at this time  Skin:  Skin Assessment: Reviewed RN Assessment  Last BM:  11/7 rectal tube  Height:   Ht Readings from Last 1 Encounters:  07/28/20 5\' 4"  (1.626 m)    Weight:   Wt Readings from Last 1 Encounters:  07/28/20 85.7 kg   BMI:  Body mass index is 32.43 kg/m.  Estimated Nutritional Needs:   Kcal:  1650-1910 kcals  Protein:  100-120 g  Fluid:  >/= 1.6 L   13/06/21 MS, RDN, LDN,  CNSC Registered Dietitian III Clinical Nutrition RD Pager and On-Call Pager Number Located in [place]

## 2020-07-30 NOTE — Procedures (Addendum)
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 Total downtime prior to ROSC ~27mins.EEG to evaluate for seizure.  Level of alertness:comatose  AEDs during EEG study:LEV,Vimpat, Versed  Technical aspects: This EEG study was done with scalp electrodes positioned according to the 10-20 International system of electrode placement. Electrical activity was acquired at a sampling rate of 500Hz  and reviewed with a high frequency filter of 70Hz  and a low frequency filter of 1Hz . EEG data were recorded continuously and digitally stored.   Description:EEG  showed continuous generalized low amplitude 3 to 6 Hz theta-delta slowing admixed with generalized spikes and polyspikes which at times are periodic at 1 to 2 Hz.   ABNORMALITY -Continuous slow, generalized -Spikes, generalized   IMPRESSION: This studyshowed generalized epileptogenicity with intermittent periodic epileptiform discharges at 1 to 2 Hz which is on the ictal interictal continuum.  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  No definite seizures were seen during the study.  EEG appears to be improving compared to previous day.  Lisa Vazquez 

## 2020-07-30 NOTE — Progress Notes (Signed)
Daily Progress Note   Patient Name: Lisa Vazquez       Date: 07/30/2020 DOB: 17-Aug-1986  Age: 34 y.o. MRN#: [id number] Attending Physician: Lynnell Catalan, MD Primary Care Physician: Patient, No Pcp Per Admit Date: 08-04-2020  Reason for Consultation/Follow-up: To discuss complex medical decision making related to patient's goals of care  Patient with estimated 30 min to ROSC after cardiac arrest in her home. Intubated. EEG showing myoclonic status.  Great concern for anoxic brain injury. Worsening renal failure creatinine on admission 1.36 -> 4.57 on 11/8.  Subjective: Was fortunate to meet with patient's mother at bedside.  I asked about decision making and she replied that they will make decisions as a family.  Lisa Vazquez has a mother and a father (no longer married), 2 brothers and a fiance.  Her 5 children are minors, thus her parents are her legal decision makers.  I talked with Mrs. Trachtenberg and she explained that she had spoken on the phone with Lisa Vazquez only 1 hour before this happened and they were making plans about what to do for her birthday celebration on 11/17.   Lisa Vazquez has a big very close family.  They are very spiritual.  Lisa Vazquez is well loved and supported.  Mrs. Feliz (mother) has worked in health care her entire life.  She is currently a Diplomatic Services operational officer at [employer] and loves patient care there.   She is easily able to tell me about her daughters current situation - cardiac arrest, down 30 minutes, probable brain injury, on-going seizures.    She talked with me about the need to continue all measures.  She is prayerful for some type of recovery.    I gently explained that Lisa Vazquez died on 08-05-23.  Her heart was restarted and we are buying her time by artificial  means.  At some point we will need to let her go but we want to give the family what they need as much as possible.   Mrs. Earl Many listened closely and stated that she is not ready to let her daughter go.   Assessment: Young female with advanced heart failure originally diagnosed 3 years ago.  Now with anoxic brain injury and seizures after prolonged cardiac arrest and resusciatation.   Patient Profile/HPI:  KARSYN JAMIE is an unfortunate 34 y.o. female with multiple medical  problems including peripartum cardiomyopathy with EF of 20% and severe MR, who had reportedly been recently noncompliant with medical care. She was admitted to the hospital on 07/28/2020 with cardiac arrest after being found unresponsive by fianc at home.  Total downtime is estimated to be around 30 minutes prior to ROSC.  Patient is critically ill with multiorgan failure and clinical exam concerning for anoxic injury.  Palliative care was consulted help address goals.    Length of Stay: 3   Vital Signs: BP (!) 143/79   Pulse 81   Temp 99 F (37.2 C)   Resp (!) 30   Ht 5\' 4"  (1.626 m)   Wt 85.7 kg   SpO2 98%   BMI 32.43 kg/m  SpO2: SpO2: 98 % O2 Device: O2 Device: Ventilator O2 Flow Rate:         Palliative Assessment/Data: 10%     Palliative Care Plan    Recommendations/Plan:  Continue current care  PMT will continue to follow with you.  Code Status:  Limited code No CPR, Defib, pressors  Prognosis:   Unable to determine   Discharge Planning:  Anticipated Hospital Death   Thank you for allowing the Palliative Medicine Team to assist in the care of this patient.  Total time spent:  35 min.     Greater than 50%  of this time was spent counseling and coordinating care related to the above assessment and plan.  , PA-C Palliative Medicine  Please contact Palliative MedicineTeam phone at [phone] for questions and concerns between 7 am - 7 pm.   Please see  AM[facility] for individual provider pager numbers.

## 2020-07-31 ENCOUNTER — Inpatient Hospital Stay (HOSPITAL_COMMUNITY): Payer: Medicaid Other

## 2020-07-31 LAB — BASIC METABOLIC PANEL
Anion gap: 18 — ABNORMAL HIGH (ref 5–15)
BUN: 62 mg/dL — ABNORMAL HIGH (ref 6–20)
CO2: 25 mmol/L (ref 22–32)
Calcium: 8.5 mg/dL — ABNORMAL LOW (ref 8.9–10.3)
Chloride: 93 mmol/L — ABNORMAL LOW (ref 98–111)
Creatinine, Ser: 4.65 mg/dL — ABNORMAL HIGH (ref 0.44–1.00)
GFR, Estimated: 12 mL/min — ABNORMAL LOW (ref >60)
Glucose, Bld: 131 mg/dL — ABNORMAL HIGH (ref 70–99)
Potassium: 3.1 mmol/L — ABNORMAL LOW (ref 3.5–5.1)
Sodium: 136 mmol/L (ref 135–145)

## 2020-07-31 LAB — CBC WITH DIFFERENTIAL/PLATELET
Abs Immature Granulocytes: 0.1 10*3/uL — ABNORMAL HIGH (ref 0.00–0.07)
Basophils Absolute: 0 10*3/uL (ref 0.0–0.1)
Basophils Relative: 0 %
Eosinophils Absolute: 0 10*3/uL (ref 0.0–0.5)
Eosinophils Relative: 0 %
HCT: 30.1 % — ABNORMAL LOW (ref 36.0–46.0)
Hemoglobin: 9.6 g/dL — ABNORMAL LOW (ref 12.0–15.0)
Immature Granulocytes: 1 %
Lymphocytes Relative: 7 %
Lymphs Abs: 1.1 10*3/uL (ref 0.7–4.0)
MCH: 19.4 pg — ABNORMAL LOW (ref 26.0–34.0)
MCHC: 31.9 g/dL (ref 30.0–36.0)
MCV: 60.9 fL — ABNORMAL LOW (ref 80.0–100.0)
Monocytes Absolute: 0.7 10*3/uL (ref 0.1–1.0)
Monocytes Relative: 5 %
Neutro Abs: 13.5 10*3/uL — ABNORMAL HIGH (ref 1.7–7.7)
Neutrophils Relative %: 87 %
Platelets: 108 10*3/uL — ABNORMAL LOW (ref 150–400)
RBC: 4.94 MIL/uL (ref 3.87–5.11)
RDW: 21.6 % — ABNORMAL HIGH (ref 11.5–15.5)
WBC: 15.5 10*3/uL — ABNORMAL HIGH (ref 4.0–10.5)
nRBC: 0.1 % (ref 0.0–0.2)

## 2020-07-31 LAB — GLUCOSE, CAPILLARY
Glucose-Capillary: 98 mg/dL (ref 70–99)
Glucose-Capillary: 121 mg/dL — ABNORMAL HIGH (ref 70–99)
Glucose-Capillary: 122 mg/dL — ABNORMAL HIGH (ref 70–99)
Glucose-Capillary: 120 mg/dL — ABNORMAL HIGH (ref 70–99)
Glucose-Capillary: 127 mg/dL — ABNORMAL HIGH (ref 70–99)

## 2020-07-31 LAB — COOXEMETRY PANEL
Carboxyhemoglobin: 1.4 % (ref 0.5–1.5)
Methemoglobin: 1.3 % (ref 0.0–1.5)
O2 Saturation: 65.6 %
Total hemoglobin: 9.6 g/dL — ABNORMAL LOW (ref 12.0–16.0)

## 2020-07-31 MED ORDER — POTASSIUM CHLORIDE 20 MEQ/15ML (10%) PO SOLN
40.0000 meq | Freq: Once | ORAL | Status: AC
  Administered 2020-07-31: 40 meq
  Filled 2020-07-31: qty 30

## 2020-07-31 MED ORDER — POTASSIUM CHLORIDE 20 MEQ PO PACK
40.0000 meq | PACK | Freq: Two times a day (BID) | ORAL | Status: AC
  Administered 2020-07-31: 40 meq
  Filled 2020-07-31: qty 2

## 2020-07-31 MED ORDER — POTASSIUM CHLORIDE 20 MEQ PO PACK
40.0000 meq | PACK | Freq: Two times a day (BID) | ORAL | Status: DC
  Administered 2020-07-31: 40 meq via ORAL
  Filled 2020-07-31: qty 2

## 2020-07-31 NOTE — Progress Notes (Signed)
LTM maintenance completed; no skin breakdown was seen. reprepped and checked under A1, Fp2, Cz, Pz, C3, and P7.

## 2020-07-31 NOTE — Progress Notes (Signed)
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  Total downtime prior to ROSC ~57mins.    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  Per fiancee she was normal when he returned home from work, then ~15 mins later when he came back into the room from changing clothes she was unresponsive.  Reportedly no c/o prior, in her usual state of health, however fiancee states she was poorly compliant with medication regimen and f/u.   Past Medical History   has no past medical history on file.   Significant Hospital Events   Admission to ICU following cardiac arrest.   Consults:  Cardiology   Procedures:  ETT 11/5>>> L IJ CVL 11/5>>>  Significant Diagnostic Tests:  11/6 CT head - no acute abnormalities 11/6  Echo - EF 10%., Grade II DD, severe mitral regurgitation. Moderate RV dysfunction.  11/7 cEEG - consistent with myoclonic status now with burst suppression pattern. 11/7 CXR - significant interval improvement with near complete resolution of pulmonary edema.  Micro Data:  COVID 11/5>>> negative   Antimicrobials:  None  Interim history/subjective:   Midazolam increased again overnight for possible seizure activity, but no EEG correlate per Epilepsy attending.   Objective   Blood pressure 123/82, pulse 70, temperature 99 F (37.2 C), temperature source Core, resp. rate (!) 33, height 5\' 4"  (1.626 m), weight 87.1 kg, SpO2 95 %. CVP:  [2 mmHg-10 mmHg] 9 mmHg  Vent Mode: PRVC FiO2 (%):  [40 %-60 %] 60 % Set Rate:  [30 bmp] 30 bmp Vt Set:  [450 mL] 450  mL PEEP:  [5 cmH20] 5 cmH20 Plateau Pressure:  [21 cmH20-23 cmH20] 23 cmH20   Intake/Output Summary (Last 24 hours) at 07/31/2020 1240 Last data filed at 07/31/2020 1200 Gross per 24 hour  Intake 3090.08 ml  Output 7880 ml  Net -4789.92 ml   Filed Weights   07/28/20 0100 07/31/20 0500  Weight: 85.7 kg 87.1 kg    Examination: General: critically ill appearing female, slender build.13/09/21  HENT: mm moist, ETT, OGT infusing tube feeds.  Positive nasal discharge Lungs: no ventilator dyssynchrony. Chest now clear bilaterally.  FiO2 down to 40%/5 PEEP Cardiovascular: LV heave, summation gallop. 3/6 holosystolic murmur head into axilla.  Abdomen: soft, +bs.  Gastric residual 300 cc only Extremities: cool, 1+ BLE pitting edema has improved Neuro: no seizure activity, no response to stimulation.   Personally reviewed continuous EEG: burst suppression pattern with slow background, only very infrequent GPD's   TTM controller shows device still actively cooling.  Resolved Hospital Problem list     Assessment & Plan:   VFib arrest - in setting known severe MR, cardiomyopathy with previous EF 25% (2020) with poor compliance - on amiodarone  Cardiogenic and distributive shock requiring titration of milrinone and norepinephrine due to acute decompensated HFrEF Acute hypoxic and hypercarbic respiratory failure due to acute pulmonary edema Hypoxic ischemic encephalopathy with myoclonus.  AKI  Plan:  -Continue current guideline directed heart failure therapy.  -Continue intermittent dose furosemide, follow creatinine, CVP now in  single digits, so likely euvolemic.  -Worsening renal function but rate of creatinine rise leveling off and patient has responded to diuretics.  Holding on dialysis initiation as patient continues to respond to diuretic and neurological status still unclear. -Active seizures appear to be suppressed on valproate, levetiracetam, Vimpat.  Stop midazolam and watch for seizure  recurrence.  -If no recurrence of myoclonus, stop EEG and obtain MRI.  - Given quantity of midazolam received and renal failure, may take up to 5 days for clearance to occur before adequate neurologic prognostication can be performed.  -TTM for fever control - continue [place]   Prognosis is poor given likelihood of anoxic encephalopathy, decompensated HF with cardiogenic shock which represents the natural history of untreated HF. Non-adherence precludes mechanical support or other advanced therapies.    Best practice:  Diet: NPO -continue tube feeds Pain/Anxiety/Delirium protocol (if indicated): midazolam stopped.  Fentanyl prn.  VAP protocol (if indicated): Bundle in place DVT prophylaxis: SCD GI prophylaxis: ppi  Glucose control: Euglycemic on no insulin therapy Mobility: BR Code Status: DNR if re-arrest.  Family Communication:  Palliative care consultation.  I spoke to father at length and he understands the gravity of her condition and that prognosis will ultimately depend on neurological recovery. Same message given to fiance today and mother yesterday.  Disposition: ICU  Labs   CBC: Recent Labs  Lab 08/10/2020 2208 08/06/2020 2309 07/28/20 0115 07/28/20 0158 07/29/20 0351 07/29/20 1039 07/30/20 0334 07/30/20 0407 07/31/20 0442  WBC 9.3  --  14.1*  --  22.2*  --  16.0*  --  15.5*  NEUTROABS 2.0  --   --   --  19.3*  --  13.4*  --  13.5*  HGB 10.0*   < > 9.9*   < > 9.1* 10.2* 8.9* 10.2* 9.6*  HCT 36.3   < > 35.3*   < > 31.7* 30.0* 28.2* 30.0* 30.1*  MCV 72.2*  --  69.5*  --  68.3*  --  62.4*  --  60.9*  PLT 109*  --  182  --  137*  --  115*  --  108*   < > = values in this interval not displayed.    Basic Metabolic Panel: Recent Labs  Lab 07/28/20 0115 07/28/20 0158 07/28/20 1129 07/28/20 1408 07/29/20 0351 07/29/20 0351 07/29/20 1037 07/29/20 1039 07/29/20 1706 07/30/20 0334 07/30/20 0407 07/30/20 1741 07/31/20 0442  NA 141   < > 141   < > 140   < >  --   139 137 138 139  --  136  K 2.9*   < > 3.9   < > 6.3*   < >  --  5.1 4.1 3.5 3.6  --  3.1*  CL 104   < > 106  --  106  --   --   --  102 101  --   --  93*  CO2 27   < > 22  --  21*  --   --   --  23 24  --   --  25  GLUCOSE 179*   < > 130*  --  89  --   --   --  122* 112*  --   --  131*  BUN 5*   < > 10  --  22*  --   --   --  31* 39*  --   --  62*  CREATININE 1.59*   < > 2.42*  --  3.78*  --   --   --  4.45* 4.57*  --   --  4.65*  CALCIUM 7.3*   < > 7.3*  --  7.2*  --   --   --  7.4* 7.8*  --   --  8.5*  MG 2.1  --   --   --   --   --  1.9  --  1.6* 3.1*  --  2.8*  --   PHOS 6.7*  --   --   --   --   --  2.7  --  3.5 4.1  --  4.5  --    < > = values in this interval not displayed.   GFR: Estimated Creatinine Clearance: 18 mL/min (A) (by C-G formula based on SCr of 4.65 mg/dL (H)). Recent Labs  Lab 07/26/2020 2208 08/05/2020 2208 07/28/20 0115 07/29/20 0350 07/29/20 0351 07/29/20 0514 07/30/20 0334 07/31/20 0442  WBC 9.3   < > 14.1*  --  22.2*  --  16.0* 15.5*  LATICACIDVEN 6.3*  --  3.8* 4.4*  --  4.4*  --   --    < > = values in this interval not displayed.    Liver Function Tests: Recent Labs  Lab 07/29/2020 2208  AST 118*  ALT 61*  ALKPHOS 66  BILITOT 0.4  PROT 5.1*  ALBUMIN 2.5*   No results for input(s): LIPASE, AMYLASE in the last 168 hours. No results for input(s): AMMONIA in the last 168 hours.  ABG    Component Value Date/Time   PHART 7.433 07/30/2020 0407   PCO2ART 38.6 07/30/2020 0407   PO2ART 101 07/30/2020 0407   HCO3 25.8 07/30/2020 0407   TCO2 27 07/30/2020 0407   ACIDBASEDEF 1.0 07/29/2020 1039   O2SAT 65.6 07/31/2020 0442     Coagulation Profile: Recent Labs  Lab 07/23/2020 2208 07/28/20 0115  INR 1.5* 1.6*    Cardiac Enzymes: No results for input(s): CKTOTAL, CKMB, CKMBINDEX, TROPONINI in the last 168 hours.  HbA1C: No results found for: HGBA1C  CBG: Recent Labs  Lab 07/30/20 2002 07/30/20 2340 07/31/20 0334 07/31/20 0811  07/31/20 1120  GLUCAP 105* 98 98 121* 122*   CRITICAL CARE Performed by: Lynnell Catalan   Total critical care time: 40 minutes  Critical care time was exclusive of separately billable procedures and treating other patients.  Critical care was necessary to treat or prevent imminent or life-threatening deterioration.  Critical care was time spent personally by me on the following activities: development of treatment plan with patient and/or surrogate as well as nursing, discussions with consultants, evaluation of patient's response to treatment, examination of patient, obtaining history from patient or surrogate, ordering and performing treatments and interventions, ordering and review of laboratory studies, ordering and review of radiographic studies, pulse oximetry, re-evaluation of patient's condition and participation in multidisciplinary rounds.  Lynnell Catalan, MD [facility] ICU Physician [facility] [place] [facility]  Pager: [phone] Mobile: [phone] After hours: [phone].

## 2020-07-31 NOTE — Progress Notes (Signed)
Pharmacist Heart Failure Core Measure Documentation  Assessment: Lisa Vazquez has an EF documented as 15-20% on 07/28/20 by Dr. Tobias Alexander .  Rationale: Heart failure patients with left ventricular systolic dysfunction (LVSD) and an EF < 40% should be prescribed an angiotensin converting enzyme inhibitor (ACEI) or angiotensin receptor blocker (ARB) at discharge unless a contraindication is documented in the medical record.  This patient is not currently on an ACEI or ARB for HF.  This note is being placed in the record in order to provide documentation that a contraindication to the use of these agents is present for this encounter.  ACE Inhibitor or Angiotensin Receptor Blocker is contraindicated (specify all that apply)  []   ACEI allergy AND ARB allergy []   Angioedema []   Moderate or severe aortic stenosis []   Hyperkalemia []   Hypotension []   Renal artery stenosis [x]   Worsening renal function, preexisting renal disease or dysfunction   Patients renal function has worsened significantly since admission. Will continue to follow.    , PharmD, MBA Pharmacy Resident [phone] 07/31/2020 11:31 AM

## 2020-07-31 NOTE — Progress Notes (Signed)
Weaning midazolam per order, noted patient to have increased spikes on EEG and one physical tremor. Elink notified of RN increasing midazolam drip to aid in minimizing seizure activity. RN will continue to monitor.

## 2020-07-31 NOTE — Procedures (Addendum)
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 Total downtime prior to ROSC ~81mins.EEG to evaluate for seizure.  Level of alertness:comatose  AEDs during EEG study:LEV,Vimpat, VPA, Versed  Technical aspects: This EEG study was done with scalp electrodes positioned according to the 10-20 International system of electrode placement. Electrical activity was acquired at a sampling rate of 500Hz  and reviewed with a high frequency filter of 70Hz  and a low frequency filter of 1Hz . EEG data were recorded continuously and digitally stored.   Description:EEG showed continuous generalized low amplitude 3 to 6 Hz theta-delta slowing admixed with generalized , maximal bifrontal spikes which at times are periodic at 0.5 Hz.   ABNORMALITY -Continuous slow, generalized -Spikes, generalized, maximal bifrontal   IMPRESSION: This studyshowed generalized epileptogenicity with intermittent periodic epileptiform discharges at 0.5Hz  which is on the ictal interictal continuum. 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  No definite seizures were seen during the study.  EEG appears to be improving compared to previous day.  Iwalani Templeton 

## 2020-07-31 NOTE — Progress Notes (Signed)
eLink Physician-Brief Progress Note Patient Name: Lisa Vazquez DOB: November 25, 1985 MRN: [id number]   Date of Service  07/31/2020  HPI/Events of Note  Notified of K 3.1 Creatinine 4.65 On furosemide 80 BID  eICU Interventions  Ordered K 40 meqs per tube x 1     Intervention Category Major Interventions: Electrolyte abnormality - evaluation and management  Darl Pikes 07/31/2020, 6:18 AM

## 2020-08-01 ENCOUNTER — Inpatient Hospital Stay (HOSPITAL_COMMUNITY): Payer: Medicaid Other

## 2020-08-01 LAB — CBC WITH DIFFERENTIAL/PLATELET
Abs Immature Granulocytes: 0.1 10*3/uL — ABNORMAL HIGH (ref 0.00–0.07)
Basophils Absolute: 0.1 10*3/uL (ref 0.0–0.1)
Basophils Relative: 0 %
Eosinophils Absolute: 0.2 10*3/uL (ref 0.0–0.5)
Eosinophils Relative: 1 %
HCT: 30.3 % — ABNORMAL LOW (ref 36.0–46.0)
Hemoglobin: 10.1 g/dL — ABNORMAL LOW (ref 12.0–15.0)
Immature Granulocytes: 0 %
Lymphocytes Relative: 4 %
Lymphs Abs: 1 10*3/uL (ref 0.7–4.0)
MCH: 20.3 pg — ABNORMAL LOW (ref 26.0–34.0)
MCHC: 33.3 g/dL (ref 30.0–36.0)
MCV: 61 fL — ABNORMAL LOW (ref 80.0–100.0)
Monocytes Absolute: 1.4 10*3/uL — ABNORMAL HIGH (ref 0.1–1.0)
Monocytes Relative: 6 %
Neutro Abs: 20.2 10*3/uL — ABNORMAL HIGH (ref 1.7–7.7)
Neutrophils Relative %: 89 %
Platelets: 112 10*3/uL — ABNORMAL LOW (ref 150–400)
RBC: 4.97 MIL/uL (ref 3.87–5.11)
RDW: 22 % — ABNORMAL HIGH (ref 11.5–15.5)
WBC: 22.8 10*3/uL — ABNORMAL HIGH (ref 4.0–10.5)
nRBC: 0.2 % (ref 0.0–0.2)

## 2020-08-01 LAB — BASIC METABOLIC PANEL
Anion gap: 17 — ABNORMAL HIGH (ref 5–15)
BUN: 88 mg/dL — ABNORMAL HIGH (ref 6–20)
CO2: 25 mmol/L (ref 22–32)
Calcium: 9.3 mg/dL (ref 8.9–10.3)
Chloride: 98 mmol/L (ref 98–111)
Creatinine, Ser: 4.25 mg/dL — ABNORMAL HIGH (ref 0.44–1.00)
GFR, Estimated: 13 mL/min — ABNORMAL LOW (ref >60)
Glucose, Bld: 139 mg/dL — ABNORMAL HIGH (ref 70–99)
Potassium: 4.2 mmol/L (ref 3.5–5.1)
Sodium: 140 mmol/L (ref 135–145)

## 2020-08-01 LAB — POCT I-STAT 7, (LYTES, BLD GAS, ICA,H+H)
Acid-Base Excess: 4 mmol/L — ABNORMAL HIGH (ref 0.0–2.0)
Bicarbonate: 29 mmol/L — ABNORMAL HIGH (ref 20.0–28.0)
Calcium, Ion: 1.22 mmol/L (ref 1.15–1.40)
HCT: 37 % (ref 36.0–46.0)
Hemoglobin: 12.6 g/dL (ref 12.0–15.0)
O2 Saturation: 79 %
Patient temperature: 37.1
Potassium: 4 mmol/L (ref 3.5–5.1)
Sodium: 140 mmol/L (ref 135–145)
TCO2: 30 mmol/L (ref 22–32)
pCO2 arterial: 43 mmHg (ref 32.0–48.0)
pH, Arterial: 7.438 (ref 7.350–7.450)
pO2, Arterial: 42 mmHg — ABNORMAL LOW (ref 83.0–108.0)

## 2020-08-01 LAB — COOXEMETRY PANEL
Carboxyhemoglobin: 1 % (ref 0.5–1.5)
Carboxyhemoglobin: 1.1 % (ref 0.5–1.5)
Methemoglobin: 1.1 % (ref 0.0–1.5)
Methemoglobin: 1.4 % (ref 0.0–1.5)
O2 Saturation: 51.1 %
O2 Saturation: 74.8 %
Total hemoglobin: 10.3 g/dL — ABNORMAL LOW (ref 12.0–16.0)
Total hemoglobin: 10 g/dL — ABNORMAL LOW (ref 12.0–16.0)

## 2020-08-01 LAB — GLUCOSE, CAPILLARY
Glucose-Capillary: 117 mg/dL — ABNORMAL HIGH (ref 70–99)
Glucose-Capillary: 152 mg/dL — ABNORMAL HIGH (ref 70–99)

## 2020-08-01 MED ORDER — ROCURONIUM BROMIDE 10 MG/ML (PF) SYRINGE
PREFILLED_SYRINGE | INTRAVENOUS | Status: AC
  Administered 2020-08-01: 100 mg
  Filled 2020-08-01: qty 10

## 2020-08-01 MED ORDER — LACTATED RINGERS IV BOLUS
250.0000 mL | Freq: Once | INTRAVENOUS | Status: AC
  Administered 2020-08-01: 250 mL via INTRAVENOUS

## 2020-08-01 MED ORDER — MIDAZOLAM HCL 2 MG/2ML IJ SOLN
4.0000 mg | Freq: Once | INTRAMUSCULAR | Status: AC
  Administered 2020-08-01: 4 mg via INTRAVENOUS

## 2020-08-01 MED ORDER — CISATRACURIUM BESYLATE 20 MG/10ML IV SOLN
0.1000 mg/kg | Freq: Once | INTRAVENOUS | Status: DC
  Filled 2020-08-01: qty 10

## 2020-08-01 MED ORDER — DEXMEDETOMIDINE HCL IN NACL 400 MCG/100ML IV SOLN
0.4000 ug/kg/h | INTRAVENOUS | Status: DC
  Administered 2020-08-01: 0.4 ug/kg/h via INTRAVENOUS
  Filled 2020-08-01: qty 100

## 2020-08-01 MED ORDER — MIDAZOLAM HCL 2 MG/2ML IJ SOLN
INTRAMUSCULAR | Status: AC
  Filled 2020-08-01: qty 4

## 2020-08-01 MED ORDER — HEPARIN BOLUS VIA INFUSION
4500.0000 [IU] | Freq: Once | INTRAVENOUS | Status: DC
  Filled 2020-08-01: qty 4500

## 2020-08-01 MED ORDER — HEPARIN (PORCINE) 25000 UT/250ML-% IV SOLN: 1200.0000 [IU]/h | INTRAVENOUS | Status: DC

## 2020-08-01 MED ORDER — MIDAZOLAM BOLUS VIA INFUSION
1.0000 mg | INTRAVENOUS | Status: DC | PRN
  Filled 2020-08-01: qty 4

## 2020-08-01 NOTE — Progress Notes (Signed)
This chaplain responded to RN-Caitlin page to provide spiritual care for Pt. and family at the time of Pt. death. This chaplain is grateful for the RN-Cybil's presence and attention to detail while caring for the Pt. The chaplain understands the Pt. died at 8:38.   This chaplain was bedside when the Pt. mother-Deborah arrived. [name] is waiting for the Pt. father and brother to arrive.  Silence was observed, along with recognizing and affirming their mother-daughter love. [name] accepted follow-up pastoral presence as needed.

## 2020-08-01 NOTE — Progress Notes (Signed)
This chaplain was present for F/U spiritual care with the Pt. family. The chaplain observed the family's willingness to be peacefully present while observing visitor restrictions. The family is leaning on each other as they share time with the Pt.

## 2020-08-01 NOTE — Progress Notes (Signed)
vLTM EEG Complete. No skin breakdown 

## 2020-08-01 NOTE — Progress Notes (Signed)
eLink Physician-Brief Progress Note Patient Name: KERRILYNN DERENZO DOB: [birth year] MRN: [id number]   Date of Service  Aug 13, 2020  HPI/Events of Note  Patient dropped her BP while on Precedex.  eICU Interventions  Precedex held, LR 250 ml iv fluid bolus x 1.        Jahni Nazar U Weltha Cathy 2020/08/13, 7:00 AM

## 2020-08-01 NOTE — Progress Notes (Addendum)
Pt BP dropped to SBP in 70's- decreased precedex per MAR. Called Elink, awaiting orders.

## 2020-08-01 NOTE — Procedures (Addendum)
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 Total downtime prior to ROSC ~73mins.EEG to evaluate for seizure.  Level of alertness:comatose  AEDs during EEG study:LEV,Vimpat, VPA, Versed  Technical aspects: This EEG study was done with scalp electrodes positioned according to the 10-20 International system of electrode placement. Electrical activity was acquired at a sampling rate of 500Hz  and reviewed with a high frequency filter of 70Hz  and a low frequency filter of 1Hz . EEG data were recorded continuously and digitally stored.   Description:EEGshowed continuous generalized low amplitude 3 to 6 Hz theta-delta slowing admixed with generalized, maximal bifrontal spikes which at times are periodic at 0.5 Hz.   ABNORMALITY -Continuous slow, generalized -Spikes, generalized, maximal bifrontal   IMPRESSION: This studyshowedgeneralized epileptogenicity with intermittent periodic epileptiform discharges at 0.5Hz  which is on the ictal interictal continuum. 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 No definite seizures were seen during the study.  Godric Lavell 

## 2020-08-01 NOTE — Progress Notes (Signed)
ANTICOAGULATION CONSULT NOTE - Initial Consult  Pharmacy Consult for heparin  Indication: pulmonary embolus  No Known Allergies  Patient Measurements: Height: 5\' 4"  (162.6 cm) Weight: 74.8 kg (164 lb 14.5 oz) IBW/kg (Calculated) : 54.7 Heparin Dosing Weight: 70.32  Vital Signs: Temp: 98.8 F (37.1 C) (11/10 0327) Temp Source: Core (11/10 0327) BP: 123/76 (11/10 0409) Pulse Rate: 67 (11/10 0700)  Labs: Recent Labs    07/30/20 0334 07/30/20 0334 07/30/20 0407 07/30/20 0407 07/31/20 0442 08/17/2020 0404  HGB 8.9*   < > 10.2*   < > 9.6* 10.1*  12.6  HCT 28.2*   < > 30.0*  --  30.1* 30.3*  37.0  PLT 115*  --   --   --  108* 112*  CREATININE 4.57*  --   --   --  4.65* 4.25*   < > = values in this interval not displayed.    Estimated Creatinine Clearance: 18.3 mL/min (A) (by C-G formula based on SCr of 4.25 mg/dL (H)).   Medical History: No past medical history on file.  Medications:  Amio 400mg     Assessment: 23 yof with known EF 20% s/p Vfib arrest. Patient has been immobile and on continuous EEG for recurrent seizures while inpatient as well. CCM consulted pharmacy to dose heparin for suspected PE. Patient's Hgb is currently 10.1 and trending down and plts have been low since admission. Patient's renal function has decreased while inpatient, but no current plans for HD per CCM. No bleeding noted upon exam of patient.   Goal of Therapy:  Heparin level 0.3-0.7 units/ml Monitor platelets by anticoagulation protocol: Yes   Plan:  Start heparin 4500 units bolus x1 followed by 1200units/hr  Check 6-hour heparin level  Monitor plts and s/sx of bleed  Monitor renal function   , PharmD, [facility] Pharmacy Resident [phone] 07/30/2020 8:45 AM

## 2020-08-01 NOTE — Progress Notes (Signed)
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  Total downtime prior to ROSC ~48mins.    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  Per fiancee she was normal when he returned home from work, then ~15 mins later when he came back into the room from changing clothes she was unresponsive.  Reportedly no c/o prior, in her usual state of health, however fiancee states she was poorly compliant with medication regimen and f/u.   Past Medical History   has no past medical history on file.   Significant Hospital Events   Admission to ICU following cardiac arrest.   Consults:  Cardiology   Procedures:  ETT 11/5>>> L IJ CVL 11/5>>>  Significant Diagnostic Tests:  11/6 CT head - no acute abnormalities 11/6  Echo - EF 10%., Grade II DD, severe mitral regurgitation. Moderate RV dysfunction.  11/7 cEEG - consistent with myoclonic status now with burst suppression pattern. 11/7 CXR - significant interval improvement with near complete resolution of pulmonary edema.  Micro Data:  COVID 11/5>>> negative   Antimicrobials:  None  Interim history/subjective:   Episodes of severe desaturation overnight.  Objective   Blood pressure 123/76, pulse 67, temperature 98.8 F (37.1 C), temperature source Core, resp. rate (!) 32, height 5\' 4"  (1.626 m), weight 74.8 kg, SpO2 99 %. CVP:  [5 mmHg-11 mmHg] 8 mmHg  Vent Mode: PRVC FiO2 (%):  [60 %-100 %] 100 % Set Rate:  [20 bmp-30 bmp] 20 bmp Vt Set:  [450 mL] 450 mL PEEP:  [5 cmH20-14 cmH20] 14 cmH20 Plateau Pressure:   [23 cmH20-36 cmH20] 32 cmH20   Intake/Output Summary (Last 24 hours) at 08/10/2020 1039 Last data filed at 08/05/2020 0700 Gross per 24 hour  Intake 1829.18 ml  Output 2605 ml  Net -775.82 ml   Filed Weights   07/31/20 0500 08/05/2020 0343 08/12/2020 0900  Weight: 87.1 kg 74.8 kg 74.8 kg    Examination: General: critically ill appearing female, slender build.13/10/21  HENT: mm moist, ETT, OGT infusing tube feeds.  Positive nasal discharge Lungs: Hypoxia.  Increased work of breathing.  Chest clear bilaterally.  No secretions on suctioning. Cardiovascular: LV heave, summation gallop. 3/6 holosystolic murmur head into axilla.  Hypotensive abdomen: soft, +bs.  Gastric residual 300 cc only Extremities: cool, 1+ BLE pitting edema has improved Neuro: no seizure activity, no response to stimulation.   Personally reviewed continuous EEG: burst suppression pattern with slow background, only very infrequent [employer]'s    Resolved Hospital Problem list     Assessment & Plan:   VFib arrest - in setting known severe MR, cardiomyopathy with previous EF 25% (2020) with poor compliance - on amiodarone  Cardiogenic and distributive shock requiring titration of milrinone and norepinephrine due to acute decompensated HFrEF Acute hypoxic and hypercarbic respiratory failure due to acute pulmonary edema Hypoxic ischemic encephalopathy with myoclonus.  AKI  Plan:  -New and profound hypoxia with relatively normal chest x-ray followed by precipitous hypotension.  Given already poor prognosis given cardiac arrest with prolonged downtime difficult to  control myoclonus and advanced heart failure no further resuscitation was attempted and the patient was allowed to pass peacefully.  I spoke to the patient's mother when she arrived and offered to answer any questions that she might have.  Labs   CBC: Recent Labs  Lab Aug 05, 2020 2208 05-Aug-2020 2309 07/28/20 0115 07/28/20 0158 07/29/20 0351 07/29/20 0351  07/29/20 1039 07/30/20 0334 07/30/20 0407 07/31/20 0442 08/02/2020 0404  WBC 9.3   < > 14.1*  --  22.2*  --   --  16.0*  --  15.5* 22.8*  NEUTROABS 2.0  --   --   --  19.3*  --   --  13.4*  --  13.5* 20.2*  HGB 10.0*   < > 9.9*   < > 9.1*   < > 10.2* 8.9* 10.2* 9.6* 10.1*  12.6  HCT 36.3   < > 35.3*   < > 31.7*   < > 30.0* 28.2* 30.0* 30.1* 30.3*  37.0  MCV 72.2*   < > 69.5*  --  68.3*  --   --  62.4*  --  60.9* 61.0*  PLT 109*   < > 182  --  137*  --   --  115*  --  108* 112*   < > = values in this interval not displayed.    Basic Metabolic Panel: Recent Labs  Lab 07/28/20 0115 07/28/20 0158 07/29/20 0351 07/29/20 1037 07/29/20 1039 07/29/20 1706 07/30/20 0334 07/30/20 0407 07/30/20 1741 07/31/20 0442 08/02/2020 0404  NA 141   < > 140  --    < > 137 138 139  --  136 140  140  K 2.9*   < > 6.3*  --    < > 4.1 3.5 3.6  --  3.1* 4.2  4.0  CL 104   < > 106  --   --  102 101  --   --  93* 98  CO2 27   < > 21*  --   --  23 24  --   --  25 25  GLUCOSE 179*   < > 89  --   --  122* 112*  --   --  131* 139*  BUN 5*   < > 22*  --   --  31* 39*  --   --  62* 88*  CREATININE 1.59*   < > 3.78*  --   --  4.45* 4.57*  --   --  4.65* 4.25*  CALCIUM 7.3*   < > 7.2*  --   --  7.4* 7.8*  --   --  8.5* 9.3  MG 2.1  --   --  1.9  --  1.6* 3.1*  --  2.8*  --   --   PHOS 6.7*  --   --  2.7  --  3.5 4.1  --  4.5  --   --    < > = values in this interval not displayed.   GFR: Estimated Creatinine Clearance: 18.3 mL/min (A) (by C-G formula based on SCr of 4.25 mg/dL (H)). Recent Labs  Lab 08/05/2020 2208 08-05-2020 2208 07/28/20 0115 07/28/20 0115 07/29/20 0350 07/29/20 0351 07/29/20 0514 07/30/20 0334 07/31/20 0442 07/31/2020 0404  WBC 9.3   < > 14.1*   < >  --  22.2*  --  16.0* 15.5* 22.8*  LATICACIDVEN 6.3*  --  3.8*  --  4.4*  --  4.4*  --   --   --    < > =  values in this interval not displayed.    Liver Function Tests: Recent Labs  Lab Jul 31, 2020 2208  AST 118*  ALT 61*  ALKPHOS  66  BILITOT 0.4  PROT 5.1*  ALBUMIN 2.5*   No results for input(s): LIPASE, AMYLASE in the last 168 hours. No results for input(s): AMMONIA in the last 168 hours.  ABG    Component Value Date/Time   PHART 7.438 08/06/2020 0404   PCO2ART 43.0 08/09/2020 0404   PO2ART 42 (L) 08/10/2020 0404   HCO3 29.0 (H) 08/09/2020 0404   TCO2 30 08/10/2020 0404   ACIDBASEDEF 1.0 07/29/2020 1039   O2SAT 74.8 08/16/2020 0620     Coagulation Profile: Recent Labs  Lab 2020/07/31 2208 07/28/20 0115  INR 1.5* 1.6*    Cardiac Enzymes: No results for input(s): CKTOTAL, CKMB, CKMBINDEX, TROPONINI in the last 168 hours.  HbA1C: No results found for: HGBA1C  CBG: Recent Labs  Lab 07/31/20 1120 07/31/20 1557 07/31/20 1956 07/25/2020 0015 07/31/2020 0328  GLUCAP 122* 120* 127* 152* 117*   CRITICAL CARE Performed by: Lynnell Catalan   Total critical care time: 40 minutes  Critical care time was exclusive of separately billable procedures and treating other patients.  Critical care was necessary to treat or prevent imminent or life-threatening deterioration.  Critical care was time spent personally by me on the following activities: development of treatment plan with patient and/or surrogate as well as nursing, discussions with consultants, evaluation of patient's response to treatment, examination of patient, obtaining history from patient or surrogate, ordering and performing treatments and interventions, ordering and review of laboratory studies, ordering and review of radiographic studies, pulse oximetry, re-evaluation of patient's condition and participation in multidisciplinary rounds.  Lynnell Catalan, MD [facility] ICU Physician [facility]  Pager: [phone] Mobile: [phone] After hours: [phone].

## 2020-08-01 NOTE — Progress Notes (Signed)
eLink Physician-Brief Progress Note Patient Name: Lisa Vazquez DOB: Mar 13, 1986 MRN: [id number]   Date of Service  08/17/2020  HPI/Events of Note  Ventilator dyssynchrony with desaturation into the 70's.  eICU Interventions  Versed 4 mg iv x 1, patient taken off the ventilator transiently and bagged, stat portable CXR ordered to r/o pneumothorax. Saturation recovered to the 90's with these measures, Precedex infusion + PRN Versed ordered for sedation.        Thomasene Lot Gus Littler 08/08/2020, 4:11 AM

## 2020-08-01 NOTE — Progress Notes (Signed)
Pt extubated per Dr Denese Killings order.

## 2020-08-01 NOTE — Progress Notes (Signed)
Late Entry  RN and CCM at bedside to evaluate patient at 0800 with noted O2 desats. RT also at bedside bagging patient. Patient is DNR. After several minutes of desats, pt's BP and HR started to decline. Patient's mother notified of current situation and informed to come to the hospital. Patient passed peacefully at [phone] with RN at bedside. No heart sounds auscultated. Extubated at this time per MD orders. Patient's mother arrived to unit around 0900. [facility] notified of patient's time of death.  Leanna Battles, RN

## 2020-08-22 NOTE — Death Summary Note (Signed)
DEATH SUMMARY   Patient Details  Name: Lisa Vazquez MRN: [id number] DOB: 1985-11-08  Admission/Discharge Information   Admit Date:  19-Aug-2020  Date of Death: Date of Death: 08-24-20  Time of Death: Time of Death: 0838  Length of Stay: 5  Referring Physician: Patient, No Pcp Per   Reason(s) for Hospitalization  Cardiac arrest due to ventricular fibrillation  Diagnoses  Preliminary cause of death: Cardiogenic shock Secondary Diagnoses (including complications and co-morbidities):  Active Problems:   Cardiac arrest [facility])   Palliative care encounter Ventricular fibrillation Postpartum cardiomyopathy Heart failure with reduced ejection fraction Cardiogenic shock Acute kidney injury Myoclonic status epilepticus  Brief Hospital Course (including significant findings, care, treatment, and services provided and events leading to death)  Lisa Vazquez is a 34 y.o. year old female who suffered a cardiac arrest at home.  Presenting rhythm was ventricular fibrillation.  She received CPR in the field for 30 minutes prior to return of spontaneous circulation. She was initially in respiratory failure requiring mechanical ventilation and cardiogenic and distributive shock requiring initiation of norepinephrine and milrinone.  Chest x-ray showed florid pulmonary edema for which she was started on furosemide infusion.  She had intractable myoclonic status requiring initiation of high-dose sedative infusions and stepwise escalation of anticonvulsant therapy.  She was maintained normothermic at 37 C. Eventually we were able to wean off all vasopressor support and initiate guideline directed heart failure therapy.  Seizures also settled and sedation was weaned. Patient then developed sudden tachypnea and hypoxia.  Vasopressors were restarted and ventilator settings were increased.  Similar pattern recurred with profound hypotension and bradycardia consistent with cardiovascular collapse  likely related to her known cardiomyopathy.  At this point she  had already been made a DNR based on prior conversations with the family and she was allowed to pass away peacefully.  No autopsy was performed.    Pertinent Labs and Studies  Significant Diagnostic Studies DG Abd 1 View  Result Date: 07/31/2020 CLINICAL DATA:  Check gastric catheter placement EXAM: ABDOMEN - 1 VIEW COMPARISON:  02/16/2017 FINDINGS: Gastric catheter is noted in the distal stomach. Contrast material is seen in the stomach as well. IMPRESSION: Gastric catheter with the tip in the distal stomach. Electronically Signed   By: Alcide Clever M.D.   On: 07/31/2020 14:00   CT Head Wo Contrast  Result Date: 07/28/2020 CLINICAL DATA:  Cerebral hemorrhage suspected EXAM: CT HEAD WITHOUT CONTRAST TECHNIQUE: Contiguous axial images were obtained from the base of the skull through the vertex without intravenous contrast. COMPARISON:  None. FINDINGS: Brain: No acute intracranial abnormality. Specifically, no hemorrhage, hydrocephalus, mass lesion, acute infarction, or significant intracranial injury. Vascular: No hyperdense vessel or unexpected calcification. Skull: No acute calvarial abnormality. Sinuses/Orbits: Visualized paranasal sinuses and mastoids clear. Orbital soft tissues unremarkable. Other: None IMPRESSION: No intracranial abnormality. Electronically Signed   By: Charlett Nose M.D.   On: 07/28/2020 01:07   DG Chest Port 1 View  Result Date: Aug 24, 2020 CLINICAL DATA:  Respiratory failure. EXAM: PORTABLE CHEST 1 VIEW COMPARISON:  July 29, 2020. FINDINGS: Stable cardiomegaly. Endotracheal and nasogastric tubes are in good position. Left internal jugular catheter is noted with tip in expected position of the SVC. No pneumothorax is noted. Left lung is clear. Mild right basilar atelectasis or infiltrate is noted. Bony thorax is unremarkable. IMPRESSION: Stable support apparatus. Mild right basilar atelectasis or infiltrate is  noted. No pneumothorax is noted. Electronically Signed   By: Zenda Alpers.D.  On: 07/24/2020 08:04   DG CHEST PORT 1 VIEW  Result Date: 07/29/2020 CLINICAL DATA:  Respiratory failure, intubated EXAM: PORTABLE CHEST 1 VIEW COMPARISON:  2020/07/29 FINDINGS: Single frontal view of the chest demonstrates stable position of the endotracheal tube and left internal jugular catheter. Enteric catheter passes below diaphragm, tip and side port excluded by collimation. Progressive opacification of the left lung base, consistent with consolidation and/or effusion. Improved interstitial and ground-glass opacities throughout the remainder of the lungs. No pneumothorax. No acute bony abnormalities. IMPRESSION: 1. Support devices as above. 2. Increasing left basilar consolidation and/or effusion. 3. Significant improvement in the interstitial and ground-glass opacities elsewhere within the lungs. Electronically Signed   By: Sharlet Salina M.D.   On: 07/29/2020 15:46   DG Chest Portable 1 View  Result Date: 07-29-2020 CLINICAL DATA:  Central line placement. EXAM: PORTABLE CHEST 1 VIEW COMPARISON:  Earlier today. FINDINGS: Left internal jugular central venous catheter tip projects over the upper SVC. No pneumothorax. Enteric tube tip 3 cm from the carina. The enteric tube tip remains in the distal esophagus. Stable cardiomegaly. Stable diffuse bilateral lung opacities and central air bronchograms. No large pleural effusion. IMPRESSION: 1. Left internal jugular central venous catheter tip projects over the upper SVC. No pneumothorax. 2. Enteric tube tip remains in the distal esophagus. Recommend advancement of greater than 10 cm. Endotracheal tube in place. 3. Stable diffuse bilateral lung opacities suspicious for pulmonary edema and possible airspace disease. Stable cardiomegaly. Electronically Signed   By: Narda Rutherford M.D.   On: 2020/07/29 23:51   DG Chest Portable 1 View  Result Date: July 29, 2020 CLINICAL  DATA:  Post CPR.  Found down at home. EXAM: PORTABLE CHEST 1 VIEW COMPARISON:  Most recent radiograph 02/19/2020.  Chest CT 03/08/2017 FINDINGS: Endotracheal tube tip is 3.5 cm from carina. Enteric tube in place with tip in the distal esophagus, recommend advancement of greater than 13 cm to place the side-port below the diaphragm. Cardiomegaly with slight progression. Diffuse lung opacities with interstitial thickening and central air bronchograms, favoring pulmonary edema. There may be an element of airspace disease/consolidation. No evidence of pneumothorax. No large pleural effusion. No acute osseous abnormalities are seen. IMPRESSION: 1. Endotracheal tube tip 3.5 cm from the carina. 2. Enteric tube tip in the distal esophagus, recommend advancement of greater than 13 cm to place the side-port below the diaphragm. 3. Cardiomegaly with pulmonary edema. Possible superimposed perihilar airspace disease/consolidation with air bronchograms. Electronically Signed   By: Narda Rutherford M.D.   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  Total downtime prior to ROSC ~31mins.  EEG to evaluate for seizure. Level of alertness:  comatose AEDs during EEG study: LEV, Propofol Technical aspects: This EEG study was done with scalp electrodes positioned according to the 10-20 International system of electrode placement. Electrical activity was acquired at a sampling rate of 500Hz  and reviewed with a high frequency filter of 70Hz  and a low frequency filter of 1Hz . EEG data were recorded continuously and digitally stored.  Description: EEG showed burst suppression with bursts of highly epileptiform discharges lasting 0.5-4 seconds and eeg  suppression lasting 4-11 seconds.  Hyperventilation and photic stimulation were not performed.   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 No definite seizures were seen throughout the recording. 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 Total downtime prior to ROSC ~2130mins. EEG to evaluate for seizure.  Level of alertness:  comatose  AEDs during EEG study: LEV, Propofol  Technical aspects: This EEG study was done with scalp electrodes positioned according to the 10-20 International system of electrode placement. Electrical activity was acquired at a sampling rate of 500Hz  and reviewed with a high frequency filter of 70Hz  and a low frequency filter of 1Hz . EEG data were recorded continuously and digitally stored. Description: EEG initially showed burst suppression with bursts of highly epileptiform discharges lasting 0.5-4 seconds and eeg suppression lasting 4-11 seconds. Gradually after titration of versed, eeg showed generalized, maximal bifrontal periodic epileptiform discharges at 1-1.5hz . There were periods of background attentuation between GPEDs which gradually improved to continuous low amplitude 3-6hz  theta-delta slowing.  Event button was pushed on 11/6/021  at 1129,1156, 1313, 1332, 1414 for unclear reason. Concomitant eeg showed generalized bursts of epileptiform discharges. Event button was pressed on 07/28/2020 at 1556 for repeated brief eye opening. Concomitant eeg showed generalized bursts of epileptiform discharges. ABNORMALITY - Myoclonic seizure, generalized - Burst suppression with highly epileptiform discharges, generalized - Periodic epileptiform discharges, Generalized  IMPRESSION: This study showed frequent myoclonic seizures as well as generalized epileptogenicity. 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  Charlsie Questriyanka O Yadav   ECHOCARDIOGRAM COMPLETE  Result Date: 07/28/2020    ECHOCARDIOGRAM REPORT   Patient Name:   Lisa IslamDOMINIQUE M Runner Date of Exam: 07/28/2020 Medical Rec #:  [id number]            Height:       64.0 in Accession #:    [id number]           Weight:       188.9 lb Date of Birth:  Nov 11, 1985           BSA:          1.910 m Patient Age:    33 years             BP:           102/63 mmHg Patient Gender: F                    HR:           96 bpm. Exam Location:  Inpatient Procedure: 2D Echo, Cardiac Doppler, Color Doppler and Intracardiac            Opacification Agent Indications:    Mitral Valve Disorder 424.0 / I05.9  History:        Patient has no prior history of Echocardiogram examinations.                 Arrythmias:Cardiac Arrest.  Sonographer:    Amil AmenJulia  Swaim RDCS Referring Phys: [id number] [facility]  Sonographer Comments: Echo performed with patient supine and on artificial respirator. IMPRESSIONS  1. Left ventricular ejection fraction, by estimation, is 10-15%. The left ventricle has severely decreased function. The left ventricle has no regional wall motion abnormalities. The left ventricular internal cavity size was severely dilated. There is mild concentric left ventricular hypertrophy. Left ventricular diastolic parameters are consistent with Grade  II diastolic dysfunction (pseudonormalization). Elevated left atrial pressure.  2. Right ventricular systolic function is moderately reduced. The right ventricular size is mildly enlarged. There is normal pulmonary artery systolic pressure. The estimated right ventricular systolic pressure is 19.4 mmHg.  3. The mitral valve is normal in structure. Moderate to severe mitral valve regurgitation. No evidence of mitral stenosis.  4. The aortic valve is normal in structure. Aortic valve regurgitation is not visualized. No aortic stenosis is present.  5. The inferior vena cava is normal in size with greater than 50% respiratory variability, suggesting right atrial pressure of 3 mmHg.  6. Severe left ventricular dilatation and systolic dysfunction with LVEF 10-15%, no apical thrombus on definity echocontrast images. Moderate right ventricular systolic dysfunction. FINDINGS  Left Ventricle: Left ventricular ejection fraction, by estimation, is <20%. The left ventricle has severely decreased function. The left ventricle has no regional wall motion abnormalities. Definity contrast agent was given IV to delineate the left ventricular endocardial borders. The left ventricular internal cavity size was severely dilated. There is mild concentric left ventricular hypertrophy. Left ventricular diastolic parameters are consistent with Grade II diastolic dysfunction (pseudonormalization). Elevated left atrial pressure. Right Ventricle: The right ventricular size is mildly enlarged. No increase in right ventricular wall thickness. Right ventricular systolic function is moderately reduced. There is normal pulmonary artery systolic pressure. The tricuspid regurgitant velocity is 1.69 m/s, and with an assumed right atrial pressure of 8 mmHg, the estimated right ventricular systolic pressure is 19.4 mmHg. Left Atrium: Left atrial size was normal in size. Right Atrium: Right atrial size was normal in size. Pericardium: There is no evidence  of pericardial effusion. Mitral Valve: The mitral valve is normal in structure. Moderate to severe mitral valve regurgitation. No evidence of mitral valve stenosis. Tricuspid Valve: The tricuspid valve is normal in structure. Tricuspid valve regurgitation is mild . No evidence of tricuspid stenosis. Aortic Valve: The aortic valve is normal in structure. Aortic valve regurgitation is not visualized. No aortic stenosis is present. Pulmonic Valve: The pulmonic valve was normal in structure. Pulmonic valve regurgitation is not visualized. No evidence of pulmonic stenosis. Aorta: The aortic root is normal in size and structure. Venous: The inferior vena cava is normal in size with greater than 50% respiratory variability, suggesting right atrial pressure of 3 mmHg. IAS/Shunts: No atrial level shunt detected by color flow Doppler.  LEFT VENTRICLE PLAX 2D LVIDd:         7.00 cm      Diastology LVIDs:         6.80 cm      LV e' medial:    5.66 cm/s LV PW:         1.00 cm      LV E/e' medial:  12.4 LV IVS:        1.00 cm      LV e' lateral:   6.74 cm/s LVOT diam:     2.30 cm      LV E/e' lateral: 10.4 LV SV:         49 LV SV Index:  26 LVOT Area:     4.15 cm  LV Volumes (MOD) LV vol d, MOD A2C: 279.0 ml LV vol d, MOD A4C: 260.0 ml LV vol s, MOD A2C: 233.0 ml LV vol s, MOD A4C: 237.0 ml LV SV MOD A2C:     46.0 ml LV SV MOD A4C:     260.0 ml LV SV MOD BP:      31.2 ml RIGHT VENTRICLE RV S prime:     9.36 cm/s TAPSE (M-mode): 1.1 cm LEFT ATRIUM             Index       RIGHT ATRIUM           Index LA diam:        3.00 cm 1.57 cm/m  RA Area:     15.80 cm LA Vol (A2C):   61.2 ml 32.05 ml/m RA Volume:   42.70 ml  22.36 ml/m LA Vol (A4C):   56.7 ml 29.69 ml/m LA Biplane Vol: 59.9 ml 31.37 ml/m  AORTIC VALVE LVOT Vmax:   95.90 cm/s LVOT Vmean:  72.300 cm/s LVOT VTI:    0.118 m  AORTA Ao Root diam: 3.30 cm MITRAL VALVE                 TRICUSPID VALVE MV Area (PHT): 6.60 cm      TR Peak grad:   11.4 mmHg MV Decel Time: 115  msec      TR Vmax:        169.00 cm/s MR Peak grad:    80.6 mmHg MR Mean grad:    47.0 mmHg   SHUNTS MR Vmax:         449.00 cm/s Systemic VTI:  0.12 m MR Vmean:        311.0 cm/s  Systemic Diam: 2.30 cm MR PISA:         0.77 cm MR PISA Eff ROA: 7 mm MR PISA Radius:  0.35 cm MV E velocity: 70.20 cm/s MV A velocity: 45.70 cm/s MV E/A ratio:  1.54 Tobias Alexander MD Electronically signed by Tobias Alexander MD Signature Date/Time: 07/28/2020/3:29:12 PM    Final     Microbiology Recent Results (from the past 240 hour(s))  Respiratory Panel by RT PCR (Flu A&B, Covid) - Nasopharyngeal Swab     Status: None   Collection Time: 08/10/2020 11:13 PM   Specimen: Nasopharyngeal Swab  Result Value Ref Range Status   SARS Coronavirus 2 by RT PCR NEGATIVE NEGATIVE Final    Comment: (NOTE) SARS-CoV-2 target nucleic acids are NOT DETECTED.  The SARS-CoV-2 RNA is generally detectable in upper respiratoy specimens during the acute phase of infection. The lowest concentration of SARS-CoV-2 viral copies this assay can detect is 131 copies/mL. A negative result does not preclude SARS-Cov-2 infection and should not be used as the sole basis for treatment or other patient management decisions. A negative result may occur with  improper specimen collection/handling, submission of specimen other than nasopharyngeal swab, presence of viral mutation(s) within the areas targeted by this assay, and inadequate number of viral copies (<131 copies/mL). A negative result must be combined with clinical observations, patient history, and epidemiological information. The expected result is Negative.  Fact Sheet for Patients:  https://www.moore.com/  Fact Sheet for Healthcare Providers:  https://www.young.biz/  This test is no t yet approved or cleared by the [place] FDA and  has been authorized for detection and/or diagnosis of SARS-CoV-2 by FDA under an Emergency Use  Authorization (EUA). This EUA will remain  in effect (meaning this test can be used) for the duration of the COVID-19 declaration under Section 564(b)(1) of the Act, 21 U.S.C. section 360bbb-3(b)(1), unless the authorization is terminated or revoked sooner.     Influenza A by PCR NEGATIVE NEGATIVE Final   Influenza B by PCR NEGATIVE NEGATIVE Final    Comment: (NOTE) The Xpert Xpress SARS-CoV-2/FLU/RSV assay is intended as an aid in  the diagnosis of influenza from Nasopharyngeal swab specimens and  should not be used as a sole basis for treatment. Nasal washings and  aspirates are unacceptable for Xpert Xpress SARS-CoV-2/FLU/RSV  testing.  Fact Sheet for Patients: https://www.moore.com/  Fact Sheet for Healthcare Providers: https://www.young.biz/  This test is not yet approved or cleared by the [place] FDA and  has been authorized for detection and/or diagnosis of SARS-CoV-2 by  FDA under an Emergency Use Authorization (EUA). This EUA will remain  in effect (meaning this test can be used) for the duration of the  Covid-19 declaration under Section 564(b)(1) of the Act, 21  U.S.C. section 360bbb-3(b)(1), unless the authorization is  terminated or revoked. Performed at [facility], [address] [place], [place] [address]   MRSA PCR Screening     Status: None   Collection Time: 07/28/20  5:13 AM   Specimen: Nasopharyngeal  Result Value Ref Range Status   MRSA by PCR NEGATIVE NEGATIVE Final    Comment:        The GeneXpert MRSA Assay (FDA approved for NASAL specimens only), is one component of a comprehensive MRSA colonization surveillance program. It is not intended to diagnose MRSA infection nor to guide or monitor treatment for MRSA infections. Performed at [facility], [address], [place], [place] [address]     Lab Basic Metabolic Panel: Recent Labs  Lab 07/30/20 1741 07/31/20 0442 07/30/2020 0404   NA  --  136 140  140  K  --  3.1* 4.2  4.0  CL  --  93* 98  CO2  --  25 25  GLUCOSE  --  131* 139*  BUN  --  62* 88*  CREATININE  --  4.65* 4.25*  CALCIUM  --  8.5* 9.3  MG 2.8*  --   --   PHOS 4.5  --   --    Liver Function Tests: No results for input(s): AST, ALT, ALKPHOS, BILITOT, PROT, ALBUMIN in the last 168 hours. No results for input(s): LIPASE, AMYLASE in the last 168 hours. No results for input(s): AMMONIA in the last 168 hours. CBC: Recent Labs  Lab 07/31/20 0442 08/20/2020 0404  WBC 15.5* 22.8*  NEUTROABS 13.5* 20.2*  HGB 9.6* 10.1*  12.6  HCT 30.1* 30.3*  37.0  MCV 60.9* 61.0*  PLT 108* 112*   Cardiac Enzymes: No results for input(s): CKTOTAL, CKMB, CKMBINDEX, TROPONINI in the last 168 hours. Sepsis Labs: Recent Labs  Lab 07/31/20 0442 08/12/2020 0404  WBC 15.5* 22.8*    Procedures/Operations  Mechanical ventilation, continuous EEG.   Deserae Jennings 08/06/2020, 2:09 PM

## 2020-08-22 DEATH — deceased
